# Patient Record
Sex: Female | Born: 1935 | Race: Black or African American | Hispanic: No | Smoking: Never smoker
Health system: Southern US, Community
[De-identification: ages and names within clinical notes are randomized; demographics above are authoritative.]

## PROBLEM LIST (undated history)

## (undated) DIAGNOSIS — L03116 Cellulitis of left lower limb: Secondary | ICD-10-CM

## (undated) DIAGNOSIS — E119 Type 2 diabetes mellitus without complications: Secondary | ICD-10-CM

## (undated) DIAGNOSIS — E039 Hypothyroidism, unspecified: Secondary | ICD-10-CM

## (undated) DIAGNOSIS — R2681 Unsteadiness on feet: Secondary | ICD-10-CM

## (undated) DIAGNOSIS — H9190 Unspecified hearing loss, unspecified ear: Secondary | ICD-10-CM

## (undated) DIAGNOSIS — IMO0001 Reserved for inherently not codable concepts without codable children: Secondary | ICD-10-CM

## (undated) DIAGNOSIS — I10 Essential (primary) hypertension: Secondary | ICD-10-CM

## (undated) DIAGNOSIS — G309 Alzheimer's disease, unspecified: Secondary | ICD-10-CM

## (undated) DIAGNOSIS — F0280 Dementia in other diseases classified elsewhere without behavioral disturbance: Secondary | ICD-10-CM

## (undated) DIAGNOSIS — H547 Unspecified visual loss: Secondary | ICD-10-CM

## (undated) HISTORY — PX: NO PAST SURGERIES: SHX2092

---

## 2006-11-30 ENCOUNTER — Other Ambulatory Visit: Payer: Self-pay

## 2006-11-30 ENCOUNTER — Ambulatory Visit: Payer: Self-pay | Admitting: Ophthalmology

## 2006-12-07 ENCOUNTER — Ambulatory Visit: Payer: Self-pay | Admitting: Ophthalmology

## 2011-10-01 ENCOUNTER — Emergency Department: Payer: Self-pay | Admitting: Emergency Medicine

## 2011-10-01 LAB — CBC
HCT: 39.5 % (ref 35.0–47.0)
HGB: 12.9 g/dL (ref 12.0–16.0)
MCH: 28.8 pg (ref 26.0–34.0)
MCHC: 32.7 g/dL (ref 32.0–36.0)
MCV: 88 fL (ref 80–100)
Platelet: 241 10*3/uL (ref 150–440)
WBC: 7.8 10*3/uL (ref 3.6–11.0)

## 2011-10-01 LAB — COMPREHENSIVE METABOLIC PANEL
Alkaline Phosphatase: 69 U/L (ref 50–136)
Anion Gap: 8 (ref 7–16)
BUN: 20 mg/dL — ABNORMAL HIGH (ref 7–18)
Bilirubin,Total: 0.2 mg/dL (ref 0.2–1.0)
Chloride: 102 mmol/L (ref 98–107)
Co2: 30 mmol/L (ref 21–32)
Creatinine: 0.78 mg/dL (ref 0.60–1.30)
EGFR (African American): >60
EGFR (Non-African Amer.): >60
Glucose: 75 mg/dL (ref 65–99)
Potassium: 4 mmol/L (ref 3.5–5.1)
SGPT (ALT): 26 U/L
Sodium: 140 mmol/L (ref 136–145)
Total Protein: 7.7 g/dL (ref 6.4–8.2)

## 2011-10-01 LAB — URINALYSIS, COMPLETE
Blood: NEGATIVE
Glucose,UR: 50 mg/dL (ref 0–75)
Ph: 5 (ref 4.5–8.0)
Protein: NEGATIVE
RBC,UR: 1 /HPF (ref 0–5)

## 2012-01-26 ENCOUNTER — Emergency Department: Payer: Self-pay | Admitting: Emergency Medicine

## 2014-08-12 ENCOUNTER — Ambulatory Visit
Admit: 2014-08-12 | Disposition: A | Discharge status: Home / Self Care | Payer: Self-pay | Attending: Neurology | Admitting: Neurology

## 2015-01-07 ENCOUNTER — Emergency Department: Payer: Medicare Other

## 2015-01-07 ENCOUNTER — Inpatient Hospital Stay
Admission: EM | Admit: 2015-01-07 | Discharge: 2015-01-11 | DRG: 469 | Disposition: A | Discharge status: Skilled Nursing Facility | Payer: Medicare Other | Attending: Internal Medicine | Admitting: Internal Medicine

## 2015-01-07 ENCOUNTER — Encounter: Payer: Self-pay | Admitting: *Deleted

## 2015-01-07 DIAGNOSIS — F0280 Dementia in other diseases classified elsewhere without behavioral disturbance: Secondary | ICD-10-CM | POA: Diagnosis present

## 2015-01-07 DIAGNOSIS — R509 Fever, unspecified: Secondary | ICD-10-CM

## 2015-01-07 DIAGNOSIS — Z79899 Other long term (current) drug therapy: Secondary | ICD-10-CM | POA: Diagnosis not present

## 2015-01-07 DIAGNOSIS — S72009A Fracture of unspecified part of neck of unspecified femur, initial encounter for closed fracture: Secondary | ICD-10-CM

## 2015-01-07 DIAGNOSIS — G309 Alzheimer's disease, unspecified: Secondary | ICD-10-CM | POA: Diagnosis present

## 2015-01-07 DIAGNOSIS — E039 Hypothyroidism, unspecified: Secondary | ICD-10-CM | POA: Diagnosis present

## 2015-01-07 DIAGNOSIS — Z8781 Personal history of (healed) traumatic fracture: Secondary | ICD-10-CM

## 2015-01-07 DIAGNOSIS — Z9889 Other specified postprocedural states: Secondary | ICD-10-CM

## 2015-01-07 DIAGNOSIS — J9811 Atelectasis: Secondary | ICD-10-CM | POA: Diagnosis not present

## 2015-01-07 DIAGNOSIS — H547 Unspecified visual loss: Secondary | ICD-10-CM | POA: Diagnosis present

## 2015-01-07 DIAGNOSIS — S72001A Fracture of unspecified part of neck of right femur, initial encounter for closed fracture: Secondary | ICD-10-CM | POA: Diagnosis present

## 2015-01-07 DIAGNOSIS — R2681 Unsteadiness on feet: Secondary | ICD-10-CM | POA: Diagnosis present

## 2015-01-07 DIAGNOSIS — G9340 Encephalopathy, unspecified: Secondary | ICD-10-CM | POA: Diagnosis not present

## 2015-01-07 DIAGNOSIS — W19XXXA Unspecified fall, initial encounter: Secondary | ICD-10-CM | POA: Diagnosis present

## 2015-01-07 DIAGNOSIS — E785 Hyperlipidemia, unspecified: Secondary | ICD-10-CM | POA: Diagnosis present

## 2015-01-07 DIAGNOSIS — E119 Type 2 diabetes mellitus without complications: Secondary | ICD-10-CM | POA: Diagnosis present

## 2015-01-07 DIAGNOSIS — I10 Essential (primary) hypertension: Secondary | ICD-10-CM | POA: Diagnosis present

## 2015-01-07 DIAGNOSIS — Z7982 Long term (current) use of aspirin: Secondary | ICD-10-CM | POA: Diagnosis not present

## 2015-01-07 DIAGNOSIS — F0390 Unspecified dementia without behavioral disturbance: Secondary | ICD-10-CM | POA: Diagnosis present

## 2015-01-07 DIAGNOSIS — H9190 Unspecified hearing loss, unspecified ear: Secondary | ICD-10-CM | POA: Diagnosis present

## 2015-01-07 DIAGNOSIS — R339 Retention of urine, unspecified: Secondary | ICD-10-CM | POA: Diagnosis not present

## 2015-01-07 DIAGNOSIS — D62 Acute posthemorrhagic anemia: Secondary | ICD-10-CM | POA: Diagnosis not present

## 2015-01-07 DIAGNOSIS — F0391 Unspecified dementia with behavioral disturbance: Secondary | ICD-10-CM

## 2015-01-07 DIAGNOSIS — Z7952 Long term (current) use of systemic steroids: Secondary | ICD-10-CM | POA: Diagnosis not present

## 2015-01-07 HISTORY — DX: Unspecified hearing loss, unspecified ear: H91.90

## 2015-01-07 HISTORY — DX: Unspecified visual loss: H54.7

## 2015-01-07 HISTORY — DX: Essential (primary) hypertension: I10

## 2015-01-07 HISTORY — DX: Type 2 diabetes mellitus without complications: E11.9

## 2015-01-07 HISTORY — DX: Dementia in other diseases classified elsewhere, unspecified severity, without behavioral disturbance, psychotic disturbance, mood disturbance, and anxiety: F02.80

## 2015-01-07 HISTORY — DX: Reserved for inherently not codable concepts without codable children: IMO0001

## 2015-01-07 HISTORY — DX: Unsteadiness on feet: R26.81

## 2015-01-07 HISTORY — DX: Alzheimer's disease, unspecified: G30.9

## 2015-01-07 HISTORY — DX: Hypothyroidism, unspecified: E03.9

## 2015-01-07 LAB — GLUCOSE, CAPILLARY: Glucose-Capillary: 212 mg/dL — ABNORMAL HIGH (ref 65–99)

## 2015-01-07 LAB — BASIC METABOLIC PANEL
Anion gap: 11 (ref 5–15)
BUN: 22 mg/dL — AB (ref 6–20)
CALCIUM: 9.5 mg/dL (ref 8.9–10.3)
CO2: 27 mmol/L (ref 22–32)
Chloride: 102 mmol/L (ref 101–111)
Creatinine, Ser: 0.97 mg/dL (ref 0.44–1.00)
GFR calc Af Amer: >60 mL/min (ref >60)
GFR, EST NON AFRICAN AMERICAN: 52 mL/min — AB (ref >60)
Glucose, Bld: 224 mg/dL — ABNORMAL HIGH (ref 65–99)
POTASSIUM: 4.8 mmol/L (ref 3.5–5.1)
SODIUM: 140 mmol/L (ref 135–145)

## 2015-01-07 LAB — CBC WITH DIFFERENTIAL/PLATELET
BASOS ABS: 0 10*3/uL (ref 0–0.1)
Basophils Relative: 0 %
EOS ABS: 0.1 10*3/uL (ref 0–0.7)
EOS PCT: 2 %
HCT: 39.9 % (ref 35.0–47.0)
Hemoglobin: 13.2 g/dL (ref 12.0–16.0)
LYMPHS ABS: 1.6 10*3/uL (ref 1.0–3.6)
Lymphocytes Relative: 23 %
MCH: 28.8 pg (ref 26.0–34.0)
MCHC: 33.1 g/dL (ref 32.0–36.0)
MCV: 87.1 fL (ref 80.0–100.0)
Monocytes Absolute: 0.4 10*3/uL (ref 0.2–0.9)
Monocytes Relative: 6 %
Neutro Abs: 4.6 10*3/uL (ref 1.4–6.5)
Neutrophils Relative %: 69 %
PLATELETS: 221 10*3/uL (ref 150–440)
RBC: 4.59 MIL/uL (ref 3.80–5.20)
RDW: 13.3 % (ref 11.5–14.5)
WBC: 6.7 10*3/uL (ref 3.6–11.0)

## 2015-01-07 LAB — APTT: aPTT: 28 seconds (ref 24–36)

## 2015-01-07 LAB — ALBUMIN: Albumin: 4 g/dL (ref 3.5–5.0)

## 2015-01-07 LAB — PROTIME-INR
INR: 0.99
Prothrombin Time: 13.3 seconds (ref 11.4–15.0)

## 2015-01-07 LAB — TYPE AND SCREEN
ABO/RH(D): A POS
ANTIBODY SCREEN: NEGATIVE

## 2015-01-07 MED ORDER — INSULIN DETEMIR 100 UNIT/ML ~~LOC~~ SOLN
20.0000 [IU] | Freq: Every day | SUBCUTANEOUS | Status: DC
  Administered 2015-01-07 – 2015-01-09 (×3): 20 [IU] via SUBCUTANEOUS
  Filled 2015-01-07 (×4): qty 0.2

## 2015-01-07 MED ORDER — ONDANSETRON HCL 4 MG/2ML IJ SOLN
4.0000 mg | Freq: Four times a day (QID) | INTRAMUSCULAR | Status: DC | PRN
  Administered 2015-01-08: 4 mg via INTRAVENOUS

## 2015-01-07 MED ORDER — LISINOPRIL 5 MG PO TABS
5.0000 mg | ORAL_TABLET | Freq: Every day | ORAL | Status: DC
  Administered 2015-01-09 – 2015-01-11 (×3): 5 mg via ORAL
  Filled 2015-01-07 (×3): qty 1

## 2015-01-07 MED ORDER — ACETAMINOPHEN 325 MG PO TABS: 650.0000 mg | ORAL_TABLET | Freq: Four times a day (QID) | ORAL | Status: DC | PRN

## 2015-01-07 MED ORDER — LEVOTHYROXINE SODIUM 50 MCG PO TABS
75.0000 ug | ORAL_TABLET | Freq: Every day | ORAL | Status: DC
  Administered 2015-01-10 – 2015-01-11 (×2): 75 ug via ORAL
  Filled 2015-01-07 (×2): qty 1

## 2015-01-07 MED ORDER — ACETAMINOPHEN 650 MG RE SUPP: 650.0000 mg | Freq: Four times a day (QID) | RECTAL | Status: DC | PRN

## 2015-01-07 MED ORDER — TRAZODONE HCL 50 MG PO TABS
50.0000 mg | ORAL_TABLET | Freq: Every day | ORAL | Status: DC
  Administered 2015-01-07 – 2015-01-10 (×4): 50 mg via ORAL
  Filled 2015-01-07 (×4): qty 1

## 2015-01-07 MED ORDER — SIMVASTATIN 20 MG PO TABS
20.0000 mg | ORAL_TABLET | Freq: Every evening | ORAL | Status: DC
  Administered 2015-01-07 – 2015-01-10 (×3): 20 mg via ORAL
  Filled 2015-01-07 (×3): qty 1

## 2015-01-07 MED ORDER — SODIUM CHLORIDE 0.9 % IV SOLN
INTRAVENOUS | Status: DC
  Administered 2015-01-07 – 2015-01-08 (×3): via INTRAVENOUS

## 2015-01-07 MED ORDER — MORPHINE SULFATE (PF) 2 MG/ML IV SOLN: 2.0000 mg | INTRAVENOUS | Status: DC | PRN

## 2015-01-07 MED ORDER — INSULIN ASPART 100 UNIT/ML ~~LOC~~ SOLN
0.0000 [IU] | Freq: Three times a day (TID) | SUBCUTANEOUS | Status: DC
  Administered 2015-01-07: 2 [IU] via SUBCUTANEOUS
  Administered 2015-01-08: 1 [IU] via SUBCUTANEOUS
  Administered 2015-01-08: 2 [IU] via SUBCUTANEOUS
  Filled 2015-01-07: qty 2
  Filled 2015-01-07: qty 1
  Filled 2015-01-07: qty 2

## 2015-01-07 MED ORDER — CEFAZOLIN SODIUM-DEXTROSE 2-3 GM-% IV SOLR
2.0000 g | INTRAVENOUS | Status: AC
Start: 2015-01-07 — End: 2015-01-08
  Administered 2015-01-08: 2 g via INTRAVENOUS
  Filled 2015-01-07: qty 50

## 2015-01-07 MED ORDER — ONDANSETRON HCL 4 MG PO TABS: 4.0000 mg | ORAL_TABLET | Freq: Four times a day (QID) | ORAL | Status: DC | PRN

## 2015-01-07 MED ORDER — MORPHINE SULFATE (PF) 2 MG/ML IV SOLN
2.0000 mg | INTRAVENOUS | Status: AC
  Administered 2015-01-07 (×2): 2 mg via INTRAVENOUS
  Filled 2015-01-07 (×2): qty 1

## 2015-01-07 MED ORDER — VITAMIN B-12 1000 MCG PO TABS
2000.0000 ug | ORAL_TABLET | Freq: Every day | ORAL | Status: DC
  Administered 2015-01-09 – 2015-01-11 (×3): 2000 ug via ORAL
  Filled 2015-01-07 (×3): qty 2

## 2015-01-07 MED ORDER — INSULIN ASPART 100 UNIT/ML ~~LOC~~ SOLN
0.0000 [IU] | Freq: Every day | SUBCUTANEOUS | Status: DC
  Administered 2015-01-07: 2 [IU] via SUBCUTANEOUS
  Administered 2015-01-08: 4 [IU] via SUBCUTANEOUS
  Administered 2015-01-09: 2 [IU] via SUBCUTANEOUS
  Filled 2015-01-07 (×2): qty 2
  Filled 2015-01-07: qty 4

## 2015-01-07 MED ORDER — OXYCODONE HCL 5 MG PO TABS: 5.0000 mg | ORAL_TABLET | ORAL | Status: DC | PRN

## 2015-01-07 NOTE — H&P (Addendum)
[facility] - [facility] at [facility]   PATIENT NAME: [name]    MR#:  [id number]  DATE OF BIRTH:  31-Oct-1927  DATE OF ADMISSION:  01/07/2015  PRIMARY CARE PHYSICIAN: CAMPBELL, Kristen Loader, FNP   REQUESTING/REFERRING PHYSICIAN: Dr. Alfonse Flavors  CHIEF COMPLAINT:   Chief Complaint  Patient presents with  . Fall   right hip fracture  HISTORY OF PRESENT ILLNESS:  [name]  is a 81 y.o. female with a known history of Alzheimer's dementia, diabetes type 2, hypothyroidism, hypertension, who presents to the hospital after a mechanical fall at the assisted living and noted to have a right hip fracture. Patient denies any prodromal symptoms prior to her fall-like chest pain, dizziness, palpitations, nausea, vomiting or any other associated symptoms. Patient normally uses a walker to get around and was not using her walker this morning when she fell. She presented to the ER as she was having significant right hip pain and was noted to have a right hip fracture. Hospitalist services were contacted for further treatment and evaluation.  PAST MEDICAL HISTORY:   Past Medical History  Diagnosis Date  . Alzheimer's dementia   . Diabetes type 2, controlled   . Hypothyroidism   . Vision impairment   . Hearing impairment   . Hypertension   . Unsteady gait     PAST SURGICAL HISTORY:  History reviewed. No pertinent past surgical history.  SOCIAL HISTORY:   Social History  Substance Use Topics  . Smoking status: Never Smoker   . Smokeless tobacco: Not on file  . Alcohol Use: No    FAMILY HISTORY:   Family History  Problem Relation Age of Onset  . Family history unknown: Yes    DRUG ALLERGIES:  No Known Allergies  REVIEW OF SYSTEMS:   Review of Systems  Constitutional: Negative for fever and weight loss.  HENT: Negative for congestion, nosebleeds and tinnitus.   Eyes: Negative for blurred vision, double vision and redness.   Respiratory: Negative for cough, hemoptysis and shortness of breath.   Cardiovascular: Negative for chest pain, orthopnea, leg swelling and PND.  Gastrointestinal: Negative for nausea, vomiting, abdominal pain, diarrhea and melena.  Genitourinary: Negative for dysuria, urgency and hematuria.  Musculoskeletal: Positive for joint pain (right hip) and falls.  Neurological: Negative for dizziness, tingling, sensory change, focal weakness, seizures, weakness and headaches.  Endo/Heme/Allergies: Negative for polydipsia. Does not bruise/bleed easily.  Psychiatric/Behavioral: Negative for depression and memory loss. The patient is not nervous/anxious.     MEDICATIONS AT HOME:   Prior to Admission medications   Medication Sig Start Date End Date Taking? Authorizing Provider  aspirin EC 81 MG tablet Take 81 mg by mouth daily.   Yes Historical Provider, MD  insulin detemir (LEVEMIR) 100 UNIT/ML injection Inject 20 Units into the skin at bedtime.   Yes Historical Provider, MD  levothyroxine (SYNTHROID, LEVOTHROID) 75 MCG tablet Take 75 mcg by mouth daily.   Yes Historical Provider, MD  lisinopril (PRINIVIL,ZESTRIL) 5 MG tablet Take 5 mg by mouth daily.   Yes Historical Provider, MD  metFORMIN (GLUCOPHAGE-XR) 500 MG 24 hr tablet Take 1,000 mg by mouth daily.   Yes Historical Provider, MD  simvastatin (ZOCOR) 20 MG tablet Take 20 mg by mouth every evening.   Yes Historical Provider, MD  traZODone (DESYREL) 50 MG tablet Take 50 mg by mouth at bedtime.   Yes Historical Provider, MD  vitamin B-12 (CYANOCOBALAMIN) 1000 MCG tablet Take 2,000 mcg by mouth daily.  Yes Historical Provider, MD      VITAL SIGNS:  Blood pressure 126/57, pulse 81, temperature 98.4 F (36.9 C), temperature source Oral, height 5\' 3"  (1.6 m), weight 65.318 kg (144 lb), SpO2 95 %.  PHYSICAL EXAMINATION:  Physical Exam  GENERAL:  81 y.o.-year-old patient lying in the bed with no acute distress.  EYES: Pupils equal, round,  reactive to light and accommodation. No scleral icterus. Extraocular muscles intact.  HEENT: Head atraumatic, normocephalic. Oropharynx and nasopharynx clear. No oropharyngeal erythema, moist oral mucosa  NECK:  Supple, no jugular venous distention. No thyroid enlargement, no tenderness.  LUNGS: Normal breath sounds bilaterally, no wheezing, rales, rhonchi. No use of accessory muscles of respiration.  CARDIOVASCULAR: S1, S2 RRR. No murmurs, rubs, gallops, clicks.  ABDOMEN: Soft, nontender, nondistended. Bowel sounds present. No organomegaly or mass.  EXTREMITIES: No pedal edema, cyanosis, or clubbing. + 2 pedal & radial pulses b/l. Right lower extremity is shortened and externally rotated due to hip fracture  NEUROLOGIC: Cranial nerves II through XII are intact. No focal Motor or sensory deficits appreciated b/l.  Globally weak PSYCHIATRIC: The patient is alert and oriented x 3. Good affect.  SKIN: No obvious rash, lesion, or ulcer.   LABORATORY PANEL:   CBC  Recent Labs Lab 01/07/15 1404  WBC 6.7  HGB 13.2  HCT 39.9  PLT 221   ------------------------------------------------------------------------------------------------------------------  Chemistries   Recent Labs Lab 01/07/15 1404  NA 140  K 4.8  CL 102  CO2 27  GLUCOSE 224*  BUN 22*  CREATININE 0.97  CALCIUM 9.5   ------------------------------------------------------------------------------------------------------------------  Cardiac Enzymes No results for input(s): TROPONINI in the last 168 hours. ------------------------------------------------------------------------------------------------------------------  RADIOLOGY:  Dg Chest 1 View  01/07/2015   CLINICAL DATA:  Patient status post fall. Right hip pain and possible fracture.  EXAM: CHEST  1 VIEW  COMPARISON:  None.  FINDINGS: Normal cardiac and mediastinal contours. No consolidative pulmonary opacities. No pleural effusion or pneumothorax.  IMPRESSION: No  acute cardiopulmonary process.   Electronically Signed   By: Annia Belt M.D.   On: 01/07/2015 14:45   Dg Hip Unilat With Pelvis 2-3 Views Right  01/07/2015   CLINICAL DATA:  Pain following fall  EXAM: DG HIP (WITH OR WITHOUT PELVIS) 2-3V RIGHT  COMPARISON:  None.  FINDINGS: Frontal pelvis as well as frontal and lateral right hip images obtained. There is a subcapital femoral neck fracture on the right in near anatomic alignment. There is mild impaction in this area. No dislocation. No other fractures are apparent. There is moderate narrowing of both hip joints. Bones are osteoporotic.  There are calcified uterine leiomyomas in the right pelvis. Rectum is borderline distended with stool.  IMPRESSION: Subcapital femoral neck fracture on the right with mild impaction at the fracture site. Moderate narrowing both hip joints. Bones osteoporotic. No dislocation.   Electronically Signed   By: Bretta Bang III M.D.   On: 01/07/2015 14:43     IMPRESSION AND PLAN:   81 year old female with past medical history of Alzheimer's dementia, diabetes, hypertension, hypothyroidism who presents to the hospital from assisted living after you're fall and noted to have a right hip fracture.  #1 preoperative medical evaluation-patient is likely a low to moderate risk for noncardiac surgery. -There are no absolute contraindications to surgery at this time. EKG has been reviewed which shows no acute ST or T-wave changes.  #2 right hip fracture-we'll consult orthopedics. Continue supportive care with pain control for now. -Patient likely go to  for surgery tomorrow.  #3 diabetes type 2 without complication-continue Levemir, sliding scale insulin. Hold metformin.  #4 hypothyroidism-continue Synthroid.  #5 hypertension-continue lisinopril.  #6 hyperlipidemia-continue simvastatin.    All the records are reviewed and case discussed with ED provider. Management plans discussed with the patient, family and they are  in agreement.  CODE STATUS: Full  TOTAL TIME TAKING CARE OF THIS PATIENT: 45 minutes.    Houston Siren M.D on 01/07/2015 at 4:03 PM  Between 7am to 6pm - Pager - [phone]  After 6pm go to [email or web address] - password [email or web address] [facility]  [facility] [place]  Office  [phone]  CC: Primary care physician; CAMPBELL, Kristen Loader, FNP

## 2015-01-07 NOTE — ED Notes (Signed)
Pt arrived via EMS from [facility] after a witnessed fall. Pt fell onto back and right hip. Pt and caregivers deny pt hitting head. Pt reporting pain in right pelvis and hip area. Pain increases with movement of leg and foot. No verbalized increase of pain with palpation. Pt is alert and oriented to person place and time when asked but has hx of dementia and is confused on commands given to pt. EMS reports this is pts baseline.

## 2015-01-07 NOTE — Progress Notes (Signed)
Clinical Child psychotherapist (CSW) confirmed that patient is a [facility] ALF resident from [facility]. Per Liborio Nixon patient has a daughter in [place] and patient was placed at [facility] by APS. CSW contacted Nadine Counts APS supervisor. Per Nadine Counts APS is not patient's guardian. CSW will continue to follow and assist as needed.   Jetta Lout, LCSWA [phone]

## 2015-01-07 NOTE — ED Notes (Signed)
Floor called by this RN attempting to give report. Floor reports they will call back shortly.

## 2015-01-07 NOTE — ED Provider Notes (Signed)
[facility] Emergency Department Provider Note  ____________________________________________  Time seen: 1:15 PM on arrival by EMS  I have reviewed the triage vital signs and the nursing notes.   HISTORY  Chief Complaint Fall    HPI [name] is a 81 y.o. female that a witnessed fall onto her right hip today at a nursing home. Bystanders who witnessed the fall denied any head injury. Pink she complains of pain in the right hip. Worse with movement of the right hip.  Patient denies any other symptoms. No chest pain shortness of breath abdominal pain back pain dizziness fevers or chills. Reports normal oral intake recently.     Past Medical History  Diagnosis Date  . Alzheimer's dementia   . Diabetes type 2, controlled   . Hypothyroidism   . Vision impairment   . Hearing impairment   . Hypertension   . Unsteady gait    Hypertension diabetes dementia  There are no active problems to display for this patient.    History reviewed. No pertinent past surgical history. None known  Current Outpatient Rx  Name  Route  Sig  Dispense  Refill  . aspirin EC 81 MG tablet   Oral   Take 81 mg by mouth daily.         . insulin detemir (LEVEMIR) 100 UNIT/ML injection   Subcutaneous   Inject 20 Units into the skin at bedtime.         Marland Kitchen levothyroxine (SYNTHROID, LEVOTHROID) 75 MCG tablet   Oral   Take 75 mcg by mouth daily.         Marland Kitchen lisinopril (PRINIVIL,ZESTRIL) 5 MG tablet   Oral   Take 5 mg by mouth daily.         . metFORMIN (GLUCOPHAGE-XR) 500 MG 24 hr tablet   Oral   Take 1,000 mg by mouth daily.         . simvastatin (ZOCOR) 20 MG tablet   Oral   Take 20 mg by mouth every evening.         . traZODone (DESYREL) 50 MG tablet   Oral   Take 50 mg by mouth at bedtime.         . vitamin B-12 (CYANOCOBALAMIN) 1000 MCG tablet   Oral   Take 2,000 mcg by mouth daily.          See printed medication  list.  Allergies Review of patient's allergies indicates no known allergies.   History reviewed. No pertinent family history.  Social History Social History  Substance Use Topics  . Smoking status: Never Smoker   . Smokeless tobacco: None  . Alcohol Use: No  no tobacco alcohol or drug use.  Review of Systems  Constitutional:   No fever or chills. No weight changes Eyes:   No blurry vision or double vision.  ENT:   No sore throat. Cardiovascular:   No chest pain. Respiratory:   No dyspnea or cough. Gastrointestinal:   Negative for abdominal pain, vomiting and diarrhea.  No BRBPR or melena. Genitourinary:   Negative for dysuria, urinary retention, bloody urine, or difficulty urinating. Musculoskeletal:   right hip pain today. Skin:   Negative for rash. Neurological:   Negative for headaches, focal weakness or numbness. Psychiatric:  No anxiety or depression.   Endocrine:  No hot/cold intolerance, changes in energy, or sleep difficulty.  10-point ROS otherwise negative.  ____________________________________________   PHYSICAL EXAM:  VITAL SIGNS: ED Triage Vitals  Enc Vitals  Group     BP 01/07/15 1344 157/73 mmHg     Pulse Rate 01/07/15 1344 80     Resp --      Temp 01/07/15 1344 98.4 F (36.9 C)     Temp Source 01/07/15 1344 Oral     SpO2 01/07/15 1320 97 %     Weight 01/07/15 1344 144 lb (65.318 kg)     Height 01/07/15 1344 5\' 3"  (1.6 m)     Head Cir --      Peak Flow --      Pain Score --      Pain Loc --      Pain Edu? --      Excl. in GC? --      Constitutional:   Alert and oriented2. Well appearing and in no distress. Eyes:   No scleral icterus. No conjunctival pallor. PERRL. EOMI ENT   Head:   Normocephalic and atraumatic.   Nose:   No congestion/rhinnorhea. No septal hematoma   Mouth/Throat:   MMM, no pharyngeal erythema. No peritonsillar mass. No uvula shift.   Neck:   No stridor. No SubQ emphysema. No  meningismus. Hematological/Lymphatic/Immunilogical:   No cervical lymphadenopathy. Cardiovascular:   RRR. Normal and symmetric distal pulses are present in all extremities. No murmurs, rubs, or gallops. Respiratory:   Normal respiratory effort without tachypnea nor retractions. Breath sounds are clear and equal bilaterally. No wheezes/rales/rhonchi. Gastrointestinal:   Soft and nontender. No distention. There is no CVA tenderness.  No rebound, rigidity, or guarding. Genitourinary:   deferred Musculoskeletal:   No tenderness to palpation of the pelvis or right lower leg. Pelvis is stable. On passive range of motion of the right hip the patient exhibits severe pain.Marland Kitchen Neurologic:   Perseverates stating "I feel like I had a fall today".  CN 2-10 normal. Motor grossly intact. No pronator drift.  Normal gait. No gross focal neurologic deficits are appreciated.  Skin:    Skin is warm, dry and intact. No rash noted.  No petechiae, purpura, or bullae. Psychiatric:   Mood and affect are normal. Speech and behavior are normal. Patient exhibits appropriate insight and judgment.  ____________________________________________    LABS (pertinent positives/negatives) (all labs ordered are listed, but only abnormal results are displayed) Labs Reviewed  BASIC METABOLIC PANEL - Abnormal; Notable for the following:    Glucose, Bld 224 (*)    BUN 22 (*)    GFR calc non Af Amer 52 (*)    All other components within normal limits  CBC WITH DIFFERENTIAL/PLATELET   ____________________________________________   EKG  Interpreted by me Normal sinus rhythm rate of 81, normal axis intervals QRS and ST segments and T waves.  ____________________________________________    RADIOLOGY  Chest x-ray unremarkable X-ray right hip shows a subcapital femoral neck fracture on the right  ____________________________________________   PROCEDURES   ____________________________________________   INITIAL  IMPRESSION / ASSESSMENT AND PLAN / ED COURSE  Pertinent labs & imaging results that were available during my care of the patient were reviewed by me and considered in my medical decision making (see chart for details).  Patient presents with right hip pain after a fall. High suspicion for hip fracture. We'll get x-rays and labs.  ----------------------------------------- 3:16 PM on 01/07/2015 -----------------------------------------  X-ray positive for hip fracture. Discussed with hospitalist, evaluated by Dr. Cherlynn Kaiser in the ED. Discussed with [place] or Dr. Martha Clan who will consult on the patient.   ____________________________________________   FINAL CLINICAL IMPRESSION(S) / ED  DIAGNOSES  Final diagnoses:  Femoral neck fracture, right, closed, initial encounter      Sharman Cheek, MD 01/07/15 1521

## 2015-01-07 NOTE — ED Notes (Signed)
Pt blood glucose is 177 per finger stick.

## 2015-01-07 NOTE — ED Notes (Signed)
Spoke with Crystal at [facility] and reported pt would be admitted for hip fracture.

## 2015-01-07 NOTE — Consult Note (Signed)
ORTHOPAEDIC CONSULTATION  REQUESTING PHYSICIAN: Houston Siren, MD  Chief Complaint: Right hip pain  HPI: [name] is a 81 y.o. female who complains of  right hip pain status post fall today. Patient is a group home resident. She sustained a mechanical fall. She states that she was trying to hang something up and lost her balance. Patient had pain in the right hip. She was sent [facility]. X-rays were taken and she was diagnosed with a right subcapital femoral neck hip fracture.  Past Medical History  Diagnosis Date  . Alzheimer's dementia   . Diabetes type 2, controlled   . Hypothyroidism   . Vision impairment   . Hearing impairment   . Hypertension   . Unsteady gait    History reviewed. No pertinent past surgical history. Social History   Social History  . Marital Status: Widowed    Spouse Name: N/A  . Number of Children: N/A  . Years of Education: N/A   Social History Main Topics  . Smoking status: Never Smoker   . Smokeless tobacco: None  . Alcohol Use: No  . Drug Use: No  . Sexual Activity: Not Asked   Other Topics Concern  . None   Social History Narrative  . None   Family History  Problem Relation Age of Onset  . Family history unknown: Yes   No Known Allergies Prior to Admission medications   Medication Sig Start Date End Date Taking? Authorizing Provider  aspirin EC 81 MG tablet Take 81 mg by mouth daily.   Yes Historical Provider, MD  insulin detemir (LEVEMIR) 100 UNIT/ML injection Inject 20 Units into the skin at bedtime.   Yes Historical Provider, MD  levothyroxine (SYNTHROID, LEVOTHROID) 75 MCG tablet Take 75 mcg by mouth daily.   Yes Historical Provider, MD  lisinopril (PRINIVIL,ZESTRIL) 5 MG tablet Take 5 mg by mouth daily.   Yes Historical Provider, MD  metFORMIN (GLUCOPHAGE-XR) 500 MG 24 hr tablet Take 1,000 mg by mouth daily.   Yes Historical Provider, MD  simvastatin (ZOCOR) 20 MG tablet Take 20 mg by mouth  every evening.   Yes Historical Provider, MD  traZODone (DESYREL) 50 MG tablet Take 50 mg by mouth at bedtime.   Yes Historical Provider, MD  vitamin B-12 (CYANOCOBALAMIN) 1000 MCG tablet Take 2,000 mcg by mouth daily.   Yes Historical Provider, MD   Dg Chest 1 View  01/07/2015   CLINICAL DATA:  Patient status post fall. Right hip pain and possible fracture.  EXAM: CHEST  1 VIEW  COMPARISON:  None.  FINDINGS: Normal cardiac and mediastinal contours. No consolidative pulmonary opacities. No pleural effusion or pneumothorax.  IMPRESSION: No acute cardiopulmonary process.   Electronically Signed   By: Annia Belt M.D.   On: 01/07/2015 14:45   Dg Hip Unilat With Pelvis 2-3 Views Right  01/07/2015   CLINICAL DATA:  Pain following fall  EXAM: DG HIP (WITH OR WITHOUT PELVIS) 2-3V RIGHT  COMPARISON:  None.  FINDINGS: Frontal pelvis as well as frontal and lateral right hip images obtained. There is a subcapital femoral neck fracture on the right in near anatomic alignment. There is mild impaction in this area. No dislocation. No other fractures are apparent. There is moderate narrowing of both hip joints. Bones are osteoporotic.  There are calcified uterine leiomyomas in the right pelvis. Rectum is borderline distended with stool.  IMPRESSION: Subcapital femoral neck fracture on the right with mild impaction at the fracture site. Moderate  narrowing both hip joints. Bones osteoporotic. No dislocation.   Electronically Signed   By: Bretta Bang III M.D.   On: 01/07/2015 14:43    Positive ROS: All other systems have been reviewed and were otherwise negative with the exception of those mentioned in the HPI and as above.  Physical Exam: General: Alert, no acute distress  MUSCULOSKELETAL: Right hip: Patient's skin is intact. She has soft thigh and leg compartments.  She has palpable pedal pulses. She has intact sensation to light touch. She can gently flex and extend her toes. Her right lower extremity is  shortened and externally rotated.  Assessment: Right subcapital femoral neck hip fracture  Plan: I explained to the patient about her injury. I'm recommending hemiarthroplasty as treatment for her subcapital femoral neck hip fracture. I spoke with one needed at her group home who provided me with the phone number to her daughter [name]. (Phone number [phone]) I spoke with [name] and explained to her about her mother's hip fracture. I explained that the plan was to proceed with a hemiarthroplasty. She understands that this is a hip replacement type of surgery. I discussed the risks and benefits of surgery. The risks include but are not limited to infection, bleeding requiring blood transfusion, nerve or blood vessel injury, joint stiffness or loss of motion, persistent pain, weakness or instability, malunion, nonunion and hardware failure and the need for further surgery. Medical risks include but are not limited to DVT and pulmonary embolism, myocardial infarction, stroke, pneumonia, respiratory failure and death.  She is in agreement with the plan to proceed with surgery.  The plan will be to proceed with surgery tomorrow pending medical clearance.     [name], MD    01/07/2015 3:49 PM

## 2015-01-07 NOTE — ED Notes (Addendum)
Pts daughter [name] was called in order to obtain consent. Daughter was not reached at [phone] but this is the correct phone number. Will attempt to call again later.

## 2015-01-08 ENCOUNTER — Inpatient Hospital Stay: Payer: Medicare Other

## 2015-01-08 ENCOUNTER — Encounter
Admission: EM | Disposition: A | Discharge status: Skilled Nursing Facility | Payer: Self-pay | Source: Home / Self Care | Attending: Internal Medicine

## 2015-01-08 ENCOUNTER — Encounter: Payer: Self-pay | Admitting: Anesthesiology

## 2015-01-08 ENCOUNTER — Inpatient Hospital Stay: Payer: Medicare Other | Admitting: Certified Registered Nurse Anesthetist

## 2015-01-08 HISTORY — PX: HIP ARTHROPLASTY: SHX981

## 2015-01-08 LAB — BASIC METABOLIC PANEL
Anion gap: 7 (ref 5–15)
BUN: 17 mg/dL (ref 6–20)
CO2: 24 mmol/L (ref 22–32)
CREATININE: 0.9 mg/dL (ref 0.44–1.00)
Calcium: 8.5 mg/dL — ABNORMAL LOW (ref 8.9–10.3)
Chloride: 108 mmol/L (ref 101–111)
GFR calc Af Amer: >60 mL/min (ref >60)
GFR, EST NON AFRICAN AMERICAN: 57 mL/min — AB (ref >60)
GLUCOSE: 197 mg/dL — AB (ref 65–99)
POTASSIUM: 4.1 mmol/L (ref 3.5–5.1)
SODIUM: 139 mmol/L (ref 135–145)

## 2015-01-08 LAB — URINALYSIS COMPLETE WITH MICROSCOPIC (ARMC ONLY)
BACTERIA UA: NONE SEEN
BILIRUBIN URINE: NEGATIVE
Glucose, UA: >500 mg/dL — AB
HGB URINE DIPSTICK: NEGATIVE
KETONES UR: NEGATIVE mg/dL
LEUKOCYTES UA: NEGATIVE
NITRITE: NEGATIVE
PH: 5 (ref 5.0–8.0)
PROTEIN: NEGATIVE mg/dL
Specific Gravity, Urine: 1.017 (ref 1.005–1.030)
Squamous Epithelial / LPF: NONE SEEN

## 2015-01-08 LAB — MRSA PCR SCREENING: MRSA by PCR: NEGATIVE

## 2015-01-08 LAB — ABO/RH: ABO/RH(D): A POS

## 2015-01-08 LAB — CBC
HCT: 35.3 % (ref 35.0–47.0)
Hemoglobin: 12 g/dL (ref 12.0–16.0)
MCH: 29 pg (ref 26.0–34.0)
MCHC: 34.1 g/dL (ref 32.0–36.0)
MCV: 85.1 fL (ref 80.0–100.0)
PLATELETS: 214 10*3/uL (ref 150–440)
RBC: 4.15 MIL/uL (ref 3.80–5.20)
RDW: 13.6 % (ref 11.5–14.5)
WBC: 11.5 10*3/uL — ABNORMAL HIGH (ref 3.6–11.0)

## 2015-01-08 LAB — GLUCOSE, CAPILLARY
GLUCOSE-CAPILLARY: 146 mg/dL — AB (ref 65–99)
GLUCOSE-CAPILLARY: 313 mg/dL — AB (ref 65–99)
Glucose-Capillary: 180 mg/dL — ABNORMAL HIGH (ref 65–99)

## 2015-01-08 LAB — VITAMIN D 25 HYDROXY (VIT D DEFICIENCY, FRACTURES): VIT D 25 HYDROXY: 30.6 ng/mL (ref 30.0–100.0)

## 2015-01-08 SURGERY — HEMIARTHROPLASTY, HIP, DIRECT ANTERIOR APPROACH, FOR FRACTURE
Anesthesia: General | Site: Hip | Laterality: Right | Wound class: Clean

## 2015-01-08 MED ORDER — ACETAMINOPHEN 650 MG RE SUPP: 650.0000 mg | Freq: Four times a day (QID) | RECTAL | Status: DC | PRN

## 2015-01-08 MED ORDER — ONDANSETRON HCL 4 MG/2ML IJ SOLN: 4.0000 mg | Freq: Four times a day (QID) | INTRAMUSCULAR | Status: DC | PRN

## 2015-01-08 MED ORDER — MENTHOL 3 MG MT LOZG
1.0000 | LOZENGE | OROMUCOSAL | Status: DC | PRN
  Filled 2015-01-08: qty 9

## 2015-01-08 MED ORDER — ACETAMINOPHEN 10 MG/ML IV SOLN
INTRAVENOUS | Status: DC | PRN
  Administered 2015-01-08: 1000 mg via INTRAVENOUS

## 2015-01-08 MED ORDER — FENTANYL CITRATE (PF) 100 MCG/2ML IJ SOLN
INTRAMUSCULAR | Status: DC | PRN
  Administered 2015-01-08: 50 ug via INTRAVENOUS
  Administered 2015-01-08: 25 ug via INTRAVENOUS
  Administered 2015-01-08: 50 ug via INTRAVENOUS
  Administered 2015-01-08 (×4): 25 ug via INTRAVENOUS
  Administered 2015-01-08: 50 ug via INTRAVENOUS

## 2015-01-08 MED ORDER — OXYCODONE HCL 5 MG PO TABS
5.0000 mg | ORAL_TABLET | ORAL | Status: DC | PRN
  Administered 2015-01-08 – 2015-01-10 (×7): 5 mg via ORAL
  Filled 2015-01-08 (×7): qty 1

## 2015-01-08 MED ORDER — MORPHINE SULFATE (PF) 2 MG/ML IV SOLN: 2.0000 mg | INTRAVENOUS | Status: DC | PRN

## 2015-01-08 MED ORDER — ONDANSETRON HCL 4 MG PO TABS: 4.0000 mg | ORAL_TABLET | Freq: Four times a day (QID) | ORAL | Status: DC | PRN

## 2015-01-08 MED ORDER — LACTATED RINGERS IV SOLN
INTRAVENOUS | Status: DC | PRN
  Administered 2015-01-08 (×2): via INTRAVENOUS

## 2015-01-08 MED ORDER — NEOMYCIN-POLYMYXIN B GU 40-200000 IR SOLN
Status: AC
  Filled 2015-01-08: qty 20

## 2015-01-08 MED ORDER — PROPOFOL 10 MG/ML IV BOLUS
INTRAVENOUS | Status: DC | PRN
  Administered 2015-01-08: 100 mg via INTRAVENOUS

## 2015-01-08 MED ORDER — ROCURONIUM BROMIDE 100 MG/10ML IV SOLN
INTRAVENOUS | Status: DC | PRN
  Administered 2015-01-08 (×2): 10 mg via INTRAVENOUS
  Administered 2015-01-08: 40 mg via INTRAVENOUS

## 2015-01-08 MED ORDER — CIPROFLOXACIN HCL 500 MG PO TABS
500.0000 mg | ORAL_TABLET | Freq: Two times a day (BID) | ORAL | Status: DC
  Administered 2015-01-08 – 2015-01-10 (×4): 500 mg via ORAL
  Filled 2015-01-08 (×4): qty 1

## 2015-01-08 MED ORDER — LIDOCAINE HCL (CARDIAC) 20 MG/ML IV SOLN
INTRAVENOUS | Status: DC | PRN
Start: 2015-01-08 — End: 2015-01-08
  Administered 2015-01-08: 60 mg via INTRAVENOUS

## 2015-01-08 MED ORDER — NEOMYCIN-POLYMYXIN B GU 40-200000 IR SOLN
Status: DC | PRN
  Administered 2015-01-08: 16 mL

## 2015-01-08 MED ORDER — NEOSTIGMINE METHYLSULFATE 10 MG/10ML IV SOLN
INTRAVENOUS | Status: DC | PRN
  Administered 2015-01-08: 3 mg via INTRAVENOUS

## 2015-01-08 MED ORDER — PHENOL 1.4 % MT LIQD: 1.0000 | OROMUCOSAL | Status: DC | PRN

## 2015-01-08 MED ORDER — ENOXAPARIN SODIUM 30 MG/0.3ML ~~LOC~~ SOLN
30.0000 mg | Freq: Two times a day (BID) | SUBCUTANEOUS | Status: DC
  Administered 2015-01-09 – 2015-01-11 (×5): 30 mg via SUBCUTANEOUS
  Filled 2015-01-08 (×5): qty 0.3

## 2015-01-08 MED ORDER — BISACODYL 10 MG RE SUPP
10.0000 mg | Freq: Every day | RECTAL | Status: DC | PRN
  Administered 2015-01-10: 10 mg via RECTAL
  Filled 2015-01-08: qty 1

## 2015-01-08 MED ORDER — ONDANSETRON HCL 4 MG/2ML IJ SOLN: 4.0000 mg | Freq: Once | INTRAMUSCULAR | Status: DC | PRN

## 2015-01-08 MED ORDER — ACETAMINOPHEN 325 MG PO TABS
650.0000 mg | ORAL_TABLET | Freq: Four times a day (QID) | ORAL | Status: DC | PRN
  Administered 2015-01-09 – 2015-01-11 (×4): 650 mg via ORAL
  Filled 2015-01-08 (×4): qty 2

## 2015-01-08 MED ORDER — GLYCOPYRROLATE 0.2 MG/ML IJ SOLN
INTRAMUSCULAR | Status: DC | PRN
  Administered 2015-01-08: 0.6 mg via INTRAVENOUS

## 2015-01-08 MED ORDER — FENTANYL CITRATE (PF) 100 MCG/2ML IJ SOLN: 25.0000 ug | INTRAMUSCULAR | Status: DC | PRN

## 2015-01-08 MED ORDER — ALUM & MAG HYDROXIDE-SIMETH 200-200-20 MG/5ML PO SUSP: 30.0000 mL | ORAL | Status: DC | PRN

## 2015-01-08 MED ORDER — PNEUMOCOCCAL VAC POLYVALENT 25 MCG/0.5ML IJ INJ: 0.5000 mL | INJECTION | INTRAMUSCULAR | Status: DC

## 2015-01-08 MED ORDER — POLYETHYLENE GLYCOL 3350 17 G PO PACK
17.0000 g | PACK | Freq: Every day | ORAL | Status: DC | PRN
  Administered 2015-01-10: 17 g via ORAL
  Filled 2015-01-08: qty 1

## 2015-01-08 MED ORDER — FERROUS SULFATE 325 (65 FE) MG PO TABS
325.0000 mg | ORAL_TABLET | Freq: Three times a day (TID) | ORAL | Status: DC
Start: 2015-01-08 — End: 2015-01-11
  Administered 2015-01-09 – 2015-01-11 (×6): 325 mg via ORAL
  Filled 2015-01-08 (×6): qty 1

## 2015-01-08 MED ORDER — MAGNESIUM CITRATE PO SOLN
1.0000 | Freq: Once | ORAL | Status: DC | PRN
  Filled 2015-01-08: qty 296

## 2015-01-08 MED ORDER — CEFAZOLIN SODIUM-DEXTROSE 2-3 GM-% IV SOLR
2.0000 g | Freq: Four times a day (QID) | INTRAVENOUS | Status: AC
  Administered 2015-01-08 – 2015-01-09 (×2): 2 g via INTRAVENOUS
  Filled 2015-01-08 (×2): qty 50

## 2015-01-08 MED ORDER — DOCUSATE SODIUM 100 MG PO CAPS
100.0000 mg | ORAL_CAPSULE | Freq: Two times a day (BID) | ORAL | Status: DC
Start: 2015-01-08 — End: 2015-01-11
  Administered 2015-01-08 – 2015-01-11 (×6): 100 mg via ORAL
  Filled 2015-01-08 (×6): qty 1

## 2015-01-08 MED ORDER — SODIUM CHLORIDE 0.9 % IV SOLN
75.0000 mL/h | INTRAVENOUS | Status: DC
  Administered 2015-01-08 (×2): 75 mL/h via INTRAVENOUS

## 2015-01-08 SURGICAL SUPPLY — 45 items
BLADE SAGITTAL WIDE XTHICK NO (BLADE) ×3 IMPLANT
BLADE SURG SZ10 CARB STEEL (BLADE) ×3 IMPLANT
CANISTER SUCT 1200ML W/VALVE (MISCELLANEOUS) ×3 IMPLANT
CANISTER SUCT 3000ML PPV (MISCELLANEOUS) ×6 IMPLANT
CAPT HIP HEMI 2 ×3 IMPLANT
DRAPE IMP U-DRAPE 54X76 (DRAPES) ×3 IMPLANT
DRAPE INCISE IOBAN 66X60 STRL (DRAPES) ×3 IMPLANT
DRAPE SHEET LG 3/4 BI-LAMINATE (DRAPES) ×6 IMPLANT
DRAPE SURG 17X11 SM STRL (DRAPES) ×3 IMPLANT
DRAPE TABLE BACK 80X90 (DRAPES) ×3 IMPLANT
DRSG OPSITE POSTOP 4X10 (GAUZE/BANDAGES/DRESSINGS) ×3 IMPLANT
ELECT BLADE 6.5 EXT (BLADE) ×3 IMPLANT
ELECT CAUTERY BLADE 6.4 (BLADE) ×3 IMPLANT
GAUZE PETRO XEROFOAM 1X8 (MISCELLANEOUS) ×6 IMPLANT
GAUZE SPONGE 4X4 12PLY STRL (GAUZE/BANDAGES/DRESSINGS) ×3 IMPLANT
GLOVE BIOGEL PI IND STRL 9 (GLOVE) ×1 IMPLANT
GLOVE BIOGEL PI INDICATOR 9 (GLOVE) ×2
GLOVE SURG 9.0 ORTHO LTXF (GLOVE) ×6 IMPLANT
GOWN STRL REUS TWL 2XL XL LVL4 (GOWN DISPOSABLE) ×3 IMPLANT
GOWN STRL REUS W/ TWL LRG LVL3 (GOWN DISPOSABLE) ×1 IMPLANT
GOWN STRL REUS W/TWL LRG LVL3 (GOWN DISPOSABLE) ×2
HANDPIECE SUCTION TUBG SURGILV (MISCELLANEOUS) ×3 IMPLANT
HEMOVAC 400ML (MISCELLANEOUS) ×3
HIP CAPITATED HEMI 2 ×1 IMPLANT
KIT DRAIN HEMOVAC JP 7FR 400ML (MISCELLANEOUS) ×1 IMPLANT
KIT RM TURNOVER STRD PROC AR (KITS) ×3 IMPLANT
NDL SAFETY 18GX1.5 (NEEDLE) ×3 IMPLANT
NEEDLE FILTER BLUNT 18X 1/2SAF (NEEDLE) ×2
NEEDLE FILTER BLUNT 18X1 1/2 (NEEDLE) ×1 IMPLANT
NEEDLE MAYO CATGUT SZ4 (NEEDLE) ×3 IMPLANT
NS IRRIG 1000ML POUR BTL (IV SOLUTION) ×3 IMPLANT
PACK HIP PROSTHESIS (MISCELLANEOUS) ×3 IMPLANT
PAD GROUND ADULT SPLIT (MISCELLANEOUS) ×9 IMPLANT
PILLOW ABDUC SM (MISCELLANEOUS) ×3 IMPLANT
RETRIEVER SUT HEWSON (MISCELLANEOUS) ×3 IMPLANT
SOL .9 NS 3000ML IRR  AL (IV SOLUTION) ×2
SOL .9 NS 3000ML IRR UROMATIC (IV SOLUTION) ×1 IMPLANT
STAPLER SKIN PROX 35W (STAPLE) ×3 IMPLANT
SUT MNCRL 3 0 RB1 (SUTURE) ×1 IMPLANT
SUT MONOCRYL 3 0 RB1 (SUTURE) ×2
SUT TICRON 2-0 30IN 311381 (SUTURE) ×12 IMPLANT
SUT VIC AB 0 CT1 36 (SUTURE) ×3 IMPLANT
SUT VIC AB 2-0 CT2 27 (SUTURE) ×6 IMPLANT
SYRINGE 10CC LL (SYRINGE) ×3 IMPLANT
TAPE MICROFOAM 4IN (TAPE) ×3 IMPLANT

## 2015-01-08 NOTE — Care Management Note (Signed)
Case Management Note  Patient Details  Name: [name] MRN: [id number] Date of Birth: [birth year]  Subjective/Objective:                 Patient presents from [facility] status post fall resulting in hip fracture.  Plan for OR today.  Patient states that she has a RW and shower chair at [employer].     Action/Plan: RNCM to follow up with discharge planning  Expected Discharge Date:                  Expected Discharge Plan:     In-House Referral:     Discharge planning Services  CM Consult  Post Acute Care Choice:    Choice offered to:     DME Arranged:    DME Agency:     HH Arranged:    HH Agency:     Status of Service:  In process, will continue to follow  Medicare Important Message Given:    Date Medicare IM Given:    Medicare IM give by:    Date Additional Medicare IM Given:    Additional Medicare Important Message give by:     If discussed at Long Length of Stay Meetings, dates discussed:    Additional Comments:  Chapman Fitch, RN 01/08/2015, 11:50 AM

## 2015-01-08 NOTE — Progress Notes (Signed)
Subjective:  Patient's sleeping comfortably postoperatively. Patient was not awoken.  Objective:   VITALS:   Filed Vitals:   01/08/15 1649 01/08/15 1704 01/08/15 1718 01/08/15 1732  BP: 168/63 162/66 156/68 163/66  Pulse: 84 85 83 82  Temp: 98.6 F (37 C) 98.8 F (37.1 C) 98.6 F (37 C)   TempSrc: Oral Oral Oral   Resp: 16 18 16 18   Height:      Weight:      SpO2: 94% 94% 96% 96%    PHYSICAL EXAM: Deferred due to patient's sleeping.   LABS  Results for orders placed or performed during the hospital encounter of 01/07/15 (from the past 24 hour(s))  Glucose, capillary     Status: Abnormal   Collection Time: 01/07/15  9:37 PM  Result Value Ref Range   Glucose-Capillary 212 (H) 65 - 99 mg/dL   Comment 1 Notify RN   MRSA PCR Screening     Status: None   Collection Time: 01/07/15 11:20 PM  Result Value Ref Range   MRSA by PCR NEGATIVE NEGATIVE  CBC     Status: Abnormal   Collection Time: 01/08/15  6:28 AM  Result Value Ref Range   WBC 11.5 (H) 3.6 - 11.0 K/uL   RBC 4.15 3.80 - 5.20 MIL/uL   Hemoglobin 12.0 12.0 - 16.0 g/dL   HCT 16.1 09.6 - 04.5 %   MCV 85.1 80.0 - 100.0 fL   MCH 29.0 26.0 - 34.0 pg   MCHC 34.1 32.0 - 36.0 g/dL   RDW 40.9 81.1 - 91.4 %   Platelets 214 150 - 440 K/uL  Basic metabolic panel     Status: Abnormal   Collection Time: 01/08/15  6:28 AM  Result Value Ref Range   Sodium 139 135 - 145 mmol/L   Potassium 4.1 3.5 - 5.1 mmol/L   Chloride 108 101 - 111 mmol/L   CO2 24 22 - 32 mmol/L   Glucose, Bld 197 (H) 65 - 99 mg/dL   BUN 17 6 - 20 mg/dL   Creatinine, Ser 7.82 0.44 - 1.00 mg/dL   Calcium 8.5 (L) 8.9 - 10.3 mg/dL   GFR calc non Af Amer 57 (L) >60 mL/min   GFR calc Af Amer >60 >60 mL/min   Anion gap 7 5 - 15  Glucose, capillary     Status: Abnormal   Collection Time: 01/08/15  7:39 AM  Result Value Ref Range   Glucose-Capillary 180 (H) 65 - 99 mg/dL   Comment 1 Notify RN   Glucose, capillary     Status: Abnormal   Collection Time:  01/08/15 11:28 AM  Result Value Ref Range   Glucose-Capillary 146 (H) 65 - 99 mg/dL   Comment 1 Notify RN     Dg Chest 1 View  01/07/2015   CLINICAL DATA:  Patient status post fall. Right hip pain and possible fracture.  EXAM: CHEST  1 VIEW  COMPARISON:  None.  FINDINGS: Normal cardiac and mediastinal contours. No consolidative pulmonary opacities. No pleural effusion or pneumothorax.  IMPRESSION: No acute cardiopulmonary process.   Electronically Signed   By: Annia Belt M.D.   On: 01/07/2015 14:45   Dg Pelvis Portable  01/08/2015   CLINICAL DATA:  Repair hip fracture.  EXAM: PORTABLE PELVIS 1-2 VIEWS  COMPARISON:  01/07/2015  FINDINGS: Exam demonstrates placement of a right hip prosthesis which is intact and normally located. Skin staples are present over the lateral soft tissues of the right hip. There  is minimal degenerative change of the left hip. Remainder of the exam is unchanged.  IMPRESSION: Interval placement of right hip prosthesis intact and normally located.   Electronically Signed   By: Elberta Fortis M.D.   On: 01/08/2015 17:18   Dg Hip Port Unilat With Pelvis 1v Right  01/08/2015   CLINICAL DATA:  Status post total hip replacement.  EXAM: DG HIP (WITH OR WITHOUT PELVIS) 1V PORT RIGHT  COMPARISON:  01/07/2015  FINDINGS: There has been interval total right hip arthroplasty, with long stem femoral component. The previously demonstrated right femoral neck fracture is no longer seen. The hardware alignment is near anatomic. Expected soft tissue edema and emphysema, and skin staples are seen. Calcified fibroids are noted.  IMPRESSION: Status post right total hip arthroplasty, with prosthetic hardware in good alignment.   Electronically Signed   By: Ted Mcalpine M.D.   On: 01/08/2015 16:40   Dg Hip Unilat With Pelvis 2-3 Views Right  01/07/2015   CLINICAL DATA:  Pain following fall  EXAM: DG HIP (WITH OR WITHOUT PELVIS) 2-3V RIGHT  COMPARISON:  None.  FINDINGS: Frontal pelvis as well  as frontal and lateral right hip images obtained. There is a subcapital femoral neck fracture on the right in near anatomic alignment. There is mild impaction in this area. No dislocation. No other fractures are apparent. There is moderate narrowing of both hip joints. Bones are osteoporotic.  There are calcified uterine leiomyomas in the right pelvis. Rectum is borderline distended with stool.  IMPRESSION: Subcapital femoral neck fracture on the right with mild impaction at the fracture site. Moderate narrowing both hip joints. Bones osteoporotic. No dislocation.   Electronically Signed   By: Bretta Bang III M.D.   On: 01/07/2015 14:43    Assessment/Plan: Day of Surgery   Active Problems:   Closed right hip fracture  Patient is resting comfortably postop. She will complete 24 hours of postop antibiotics. Encourage incentive spirometry while awake. Physical and occupational therapy will begin tomorrow. Foley catheter will be removed tomorrow. I reviewed the postoperative x-rays which demonstrate the hip prosthesis is well positioned and leg lengths are equivalent. Continue abduction pillow while patient in bed. Patient will be partial weightbearing on the right lower extremity for 4 weeks postop. Patient will begin Lovenox for DVT prophylaxis in the morning. She is ordered for her TED stockings and foot pumps to begin immediately postop. Will recheck labs in the morning.    [name] , MD 01/08/2015, 5:59 PM

## 2015-01-08 NOTE — Anesthesia Preprocedure Evaluation (Signed)
Anesthesia Evaluation  Patient identified by MRN, date of birth, ID band Patient awake    Reviewed: Allergy & Precautions, H&P , NPO status , Patient's Chart, lab work & pertinent test results, reviewed documented beta blocker date and time   History of Anesthesia Complications Negative for: history of anesthetic complications  Airway Mallampati: II  TM Distance: >3 FB Neck ROM: full    Dental  (+) Missing, Poor Dentition   Pulmonary neg pulmonary ROS,  breath sounds clear to auscultation  Pulmonary exam normal       Cardiovascular Exercise Tolerance: Good hypertension, On Medications - angina- CAD, - Past MI, - Cardiac Stents and - CABG Normal cardiovascular exam- dysrhythmias - Valvular Problems/MurmursRhythm:regular Rate:Normal     Neuro/Psych PSYCHIATRIC DISORDERS (Alzheimer's dementia) negative neurological ROS     GI/Hepatic negative GI ROS, Neg liver ROS,   Endo/Other  diabetesHypothyroidism Morbid obesity  Renal/GU negative Renal ROS  negative genitourinary   Musculoskeletal   Abdominal   Peds  Hematology negative hematology ROS (+)   Anesthesia Other Findings Past Medical History:   Alzheimer's dementia                                         Diabetes type 2, controlled                                  Hypothyroidism                                               Vision impairment                                            Hearing impairment                                           Hypertension                                                 Unsteady gait                                                Reproductive/Obstetrics negative OB ROS                             Anesthesia Physical Anesthesia Plan  ASA: III  Anesthesia Plan: General   Post-op Pain Management:    Induction:   Airway Management Planned:   Additional Equipment:   Intra-op Plan:    Post-operative Plan:   Informed Consent: I have reviewed the patients History and Physical, chart, labs and discussed the procedure including the risks, benefits and alternatives for the proposed anesthesia with the patient  or authorized representative who has indicated his/her understanding and acceptance.   Dental Advisory Given  Plan Discussed with: Anesthesiologist, CRNA and Surgeon  Anesthesia Plan Comments:         Anesthesia Quick Evaluation

## 2015-01-08 NOTE — Op Note (Signed)
01/07/2015 - 01/08/2015  3:43 PM  PATIENT:  [name]   MRN: [id number]  PRE-OPERATIVE DIAGNOSIS:  Right femoral neck hip fracture  POST-OPERATIVE DIAGNOSIS:  Right femoral neck hip fracture  PROCEDURE:  Procedure(s): RIGHT HIP HEMIARTHROPLASTY  PREOPERATIVE INDICATIONS:    [name] is an 81 y.o. female who has a diagnosis of hip fracture after a fall yesterday. She was brought to the [facility] emergency Department where she was diagnosed with a right femoral neck hip fracture by x-ray. I spoke with the patient and her daughter in [place] by phone. I recommended a right hip hemiarthroplasty given the displacement of her femoral head. The patient and her daughter agreed with surgical management of his fracture.  The risks benefits and alternatives were discussed with the patient and their family including but not limited to the risks of  infection requiring removal of the prosthesis, bleeding requiring blood transfusion, nerve injury especially to the sciatic nerve leading to foot drop or lower extremity numbness, periprosthetic fracture, dislocation, leg length discrepancy, change in lower extremity rotation persistent hip pain, loosening or failure of the components and the need for revision surgery. Medical risks include but are not limited to DVT and pulmonary wasn't, myocardial infarction, stroke, pneumonia, respiratory failure and death.  OPERATIVE REPORT     SURGEON:  Thornton Park, MD    ANESTHESIA:  Gen.    COMPLICATIONS:  None.   SPECIMEN: Femoral head to pathology    COMPONENTS:  Stryker Accolade TMZF femoral component size 2.5 with a 46 mm Stryker Unitrax head with a -4 neck adjustment sleeve    PROCEDURE IN DETAIL:   The patient was met in the holding area and  identified.  The appropriate hip was identified and marked at the operative site after verbally confirming with the patient that this was the correct site of surgery.  The patient was then  transported to the OR  and  underwent general anesthesia.  The patient was then placed in the lateral decubitus position with the operative side up and secured on the operating room table with a pegboard and all bony prominences were adequately padded. This included an axillary roll and additional padding around the nonoperative leg to prevent compression to the common peroneal nerve.    The operative lower extremity was prepped and draped in a sterile fashion.  A time out was performed prior to incision to verify patient's name, date of birth, medical record number, correct site of surgery correct procedure to be performed. Was also used to verify the patient received antibiotics now appropriate instruments, implants and radiographic studies were available in the room. Once all in attendance were in agreement case began. Patient received 2 g of prior to the incision.    A posterolateral approach was utilized via sharp dissection  carried down to the subcutaneous tissue.  Bleeding vessels were coagulated using electrocautery.  The fascia lata was identified and incised along the length of the skin incision.  The gluteus maximus muscle was then split in line with its fibers. Self-retaining retractors were  inserted.  With the hip internally rotated, the short external rotators  were identified and removed from the posterior attachment from the greater trochanter. The piriformis was tagged for later repair. The capsule was identified and a T-shaped capsulotomy was performed. The capsule was tagged with #2 Tycron for later repair.  The femoral neck fracture was exposed, and the femoral head was removed using a corkscrew device. This was measured to be 46  mm in diameter. The attention was then turned to proximal femur preparation.  An oscillating saw was used to perform a proximal femoral osteotomy 1 fingerbreadth above the lesser trochanter. The trial 46 mm femoral head was placed into the acetabulum and had an  excellent suction fit. The attention was then turned back to femoral preparation.    A femoral skid and Cobra retractor were placed under the femoral neck to allow for adequate visualization. A box osteotome was used to make the initial entry into the proximal femur. A single hand reamer was used to prepare the femoral canal. A T-shaped femoral canal sounder was then used to ensure no penetration femoral cortex had occurred during reaming. The proximal femur was then sequentially broached by hand. A 2.5 femoral trial was found to have the best medial lateral fit. Once adequate mediolateral canal fill was achieved the trial femoral broach, neck, and head was assembled and the hip was reduced. It was found to have excellent stability, equivalent leg lengths with functional range of motion. The trial components were then removed.  I copiously irrigated the femoral canal and then impacted the real femoral prosthesis into place into the appropriate version, slightly anteverted to the normal anatomy, and I impacted the actual 46 mm Unitrax femoral component with a -4 neck adjustment sleeve into place. The hip was then reduced and taken through functional range of motion and found to have excellent stability. Leg lengths were restored.   The hip joint was copiously irrigated.   A soft tissue repair of the capsule  was performed using #2 Tycron.  Excellent posterior capsular repair was achieved. The fascia lata was then closed with interrupted 0 Vicryl suture. The subcutaneous tissues were closed with 2-0 Vicryl and the skin approximated with staples.   The patient was then placed supine on the operative table. Leg lengths were checked clinically and found to be equivalent. An abduction pillow was placed between the lower extremities. The patient was then transferred to a hospital bed and brought to the PACU in stable condition. I was scrubbed and present the entire case and all sharp and instrument counts were  correct at the conclusion of the case. I spoke with the patient's daughter by phone in [place] from the PACU to let her know the case had gone without complication patient was stable in recovery room.   Timoteo Gaul, MD Orthopedic Surgeon

## 2015-01-08 NOTE — Progress Notes (Signed)
[facility] [place] at [facility]   PATIENT NAME: [name]    MR#:  [id number]  DATE OF BIRTH:  27-May-1927  SUBJECTIVE:  CHIEF COMPLAINT:   Chief Complaint  Patient presents with  . Fall   Pain right hip if moved. Waiting for surgery  REVIEW OF SYSTEMS:    Review of Systems  Constitutional: Positive for malaise/fatigue. Negative for fever and chills.  HENT: Negative for sore throat.   Eyes: Negative for blurred vision, double vision and pain.  Respiratory: Negative for cough, hemoptysis, shortness of breath and wheezing.   Cardiovascular: Negative for chest pain, palpitations, orthopnea and leg swelling.  Gastrointestinal: Negative for heartburn, nausea, vomiting, abdominal pain, diarrhea and constipation.  Genitourinary: Negative for dysuria and hematuria.  Musculoskeletal: Positive for back pain and joint pain.  Skin: Negative for rash.  Neurological: Positive for headaches. Negative for sensory change, speech change and focal weakness.  Endo/Heme/Allergies: Does not bruise/bleed easily.  Psychiatric/Behavioral: Negative for depression. The patient is not nervous/anxious.       DRUG ALLERGIES:  No Known Allergies  VITALS:  Blood pressure 155/78, pulse 101, temperature 100.1 F (37.8 C), temperature source Oral, resp. rate 16, height 5\' 3"  (1.6 m), weight 63.095 kg (139 lb 1.6 oz), SpO2 96 %.  PHYSICAL EXAMINATION:   Physical Exam  GENERAL:  81 y.o.-year-old patient lying in the bed with no acute distress.  EYES: Pupils equal, round, reactive to light and accommodation. No scleral icterus. Extraocular muscles intact.  HEENT: Head atraumatic, normocephalic. Oropharynx and nasopharynx clear.  NECK:  Supple, no jugular venous distention. No thyroid enlargement, no tenderness.  LUNGS: Normal breath sounds bilaterally, no wheezing, rales, rhonchi. No use of accessory muscles of respiration.  CARDIOVASCULAR: S1, S2 normal. No murmurs,  rubs, or gallops.  ABDOMEN: Soft, nontender, nondistended. Bowel sounds present. No organomegaly or mass.  EXTREMITIES: No cyanosis, clubbing or edema b/l.    NEUROLOGIC: Cranial nerves II through XII are intact. No focal Motor or sensory deficits b/l.   PSYCHIATRIC: The patient is alert and oriented x 3.  SKIN: No obvious rash, lesion, or ulcer.   Right hip tender  LABORATORY PANEL:   CBC  Recent Labs Lab 01/08/15 0628  WBC 11.5*  HGB 12.0  HCT 35.3  PLT 214   ------------------------------------------------------------------------------------------------------------------  Chemistries   Recent Labs Lab 01/08/15 0628  NA 139  K 4.1  CL 108  CO2 24  GLUCOSE 197*  BUN 17  CREATININE 0.90  CALCIUM 8.5*   ------------------------------------------------------------------------------------------------------------------  Cardiac Enzymes No results for input(s): TROPONINI in the last 168 hours. ------------------------------------------------------------------------------------------------------------------  RADIOLOGY:  Dg Chest 1 View  01/07/2015   CLINICAL DATA:  Patient status post fall. Right hip pain and possible fracture.  EXAM: CHEST  1 VIEW  COMPARISON:  None.  FINDINGS: Normal cardiac and mediastinal contours. No consolidative pulmonary opacities. No pleural effusion or pneumothorax.  IMPRESSION: No acute cardiopulmonary process.   Electronically Signed   By: Annia Belt M.D.   On: 01/07/2015 14:45   Dg Hip Unilat With Pelvis 2-3 Views Right  01/07/2015   CLINICAL DATA:  Pain following fall  EXAM: DG HIP (WITH OR WITHOUT PELVIS) 2-3V RIGHT  COMPARISON:  None.  FINDINGS: Frontal pelvis as well as frontal and lateral right hip images obtained. There is a subcapital femoral neck fracture on the right in near anatomic alignment. There is mild impaction in this area. No dislocation. No other fractures are apparent. There is moderate narrowing  of both hip joints. Bones  are osteoporotic.  There are calcified uterine leiomyomas in the right pelvis. Rectum is borderline distended with stool.  IMPRESSION: Subcapital femoral neck fracture on the right with mild impaction at the fracture site. Moderate narrowing both hip joints. Bones osteoporotic. No dislocation.   Electronically Signed   By: Bretta Bang III M.D.   On: 01/07/2015 14:43     ASSESSMENT AND PLAN:   81 year old female with past medical history of Alzheimer's dementia, diabetes, hypertension, hypothyroidism who presents to the hospital from assisted living after you're fall and noted to have a right hip fracture.  * Right hip fracture. Patient is scheduled for surgery later today. DVT prophylaxis, physical therapy and discharge planning per orthopedics.  # diabetes type 2 without complication-continue Levemir, sliding scale insulin.  # hypothyroidism-continue Synthroid.  # hypertension-continue lisinopril.  # hyperlipidemia-continue simvastatin.  All the records are reviewed and case discussed with Care Management/Social Workerr. Management plans discussed with the patient, family and they are in agreement.  CODE STATUS: FULL  DVT Prophylaxis: SCDs  TOTAL TIME TAKING CARE OF THIS PATIENT: 35 minutes.   POSSIBLE D/C IN 2-3 DAYS, DEPENDING ON CLINICAL CONDITION.   Milagros Loll R M.D on 01/08/2015 at 12:42 PM  Between 7am to 6pm - Pager - [phone]  After 6pm go to [email or web address] - password [email or web address] [facility]  [facility]  Office  [phone]  CC: Primary care physician; CAMPBELL, Kristen Loader, FNP

## 2015-01-08 NOTE — Progress Notes (Signed)
Pt. Dangled on side of bed. Tolerated dangle well with some pain. Oxycodone  PO administered after pt. Was returned to bed.

## 2015-01-08 NOTE — Transfer of Care (Signed)
Immediate Anesthesia Transfer of Care Note  Patient: [name]  Procedure(s) Performed: Procedure(s): ARTHROPLASTY BIPOLAR HIP (HEMIARTHROPLASTY) (Right)  Patient Location: PACU  Anesthesia Type:General  Level of Consciousness: awake and alert   Airway & Oxygen Therapy: Patient Spontanous Breathing and Patient connected to face mask oxygen  Post-op Assessment: Report given to RN and Post -op Vital signs reviewed and stable  Post vital signs: Reviewed and stable  Last Vitals:  Filed Vitals:   01/08/15 0738  BP: 155/78  Pulse: 101  Temp: 37.8 C  Resp: 16  1540 VS: BP 146/65 Pulse 87 Resp 20 Temp 99.1  Complications: No apparent anesthesia complications

## 2015-01-09 ENCOUNTER — Encounter: Payer: Self-pay | Admitting: Orthopedic Surgery

## 2015-01-09 ENCOUNTER — Inpatient Hospital Stay: Payer: Medicare Other

## 2015-01-09 LAB — GLUCOSE, CAPILLARY
GLUCOSE-CAPILLARY: 159 mg/dL — AB (ref 65–99)
Glucose-Capillary: 177 mg/dL — ABNORMAL HIGH (ref 65–99)
Glucose-Capillary: 200 mg/dL — ABNORMAL HIGH (ref 65–99)
Glucose-Capillary: 245 mg/dL — ABNORMAL HIGH (ref 65–99)

## 2015-01-09 LAB — URINALYSIS COMPLETE WITH MICROSCOPIC (ARMC ONLY)
Bacteria, UA: NONE SEEN
Bilirubin Urine: NEGATIVE
Glucose, UA: >500 mg/dL — AB
Hgb urine dipstick: NEGATIVE
Leukocytes, UA: NEGATIVE
Nitrite: NEGATIVE
Protein, ur: NEGATIVE mg/dL
Specific Gravity, Urine: 1.015 (ref 1.005–1.030)
Squamous Epithelial / LPF: NONE SEEN
pH: 5 (ref 5.0–8.0)

## 2015-01-09 LAB — BASIC METABOLIC PANEL
ANION GAP: 8 (ref 5–15)
BUN: 11 mg/dL (ref 6–20)
CALCIUM: 8 mg/dL — AB (ref 8.9–10.3)
CHLORIDE: 104 mmol/L (ref 101–111)
CO2: 24 mmol/L (ref 22–32)
Creatinine, Ser: 0.88 mg/dL (ref 0.44–1.00)
GFR calc non Af Amer: 58 mL/min — ABNORMAL LOW (ref >60)
Glucose, Bld: 212 mg/dL — ABNORMAL HIGH (ref 65–99)
POTASSIUM: 3.8 mmol/L (ref 3.5–5.1)
Sodium: 136 mmol/L (ref 135–145)

## 2015-01-09 LAB — URINE CULTURE: Culture: NO GROWTH

## 2015-01-09 LAB — CBC
HEMATOCRIT: 30.8 % — AB (ref 35.0–47.0)
HEMOGLOBIN: 10.3 g/dL — AB (ref 12.0–16.0)
MCH: 28.9 pg (ref 26.0–34.0)
MCHC: 33.6 g/dL (ref 32.0–36.0)
MCV: 85.9 fL (ref 80.0–100.0)
Platelets: 178 10*3/uL (ref 150–440)
RBC: 3.58 MIL/uL — AB (ref 3.80–5.20)
RDW: 13.3 % (ref 11.5–14.5)
WBC: 11.7 10*3/uL — AB (ref 3.6–11.0)

## 2015-01-09 NOTE — Evaluation (Signed)
Occupational Therapy Evaluation Patient Details Name: [name] MRN: [id number] DOB: 03-27-1928 Today's Date: 01/09/2015    History of Present Illness This patient is an 81 year old female who came to [facility] after a fall at her assisted living facility. She suffered a R hip fracture and recieved a hemiarthroplasty repair.    Clinical Impression   She lives in assisted living but was able to dress and bathe herself. She now needs assist and would benefit from Occupational Therapy for ADL/functional mobility training while .staying within hip precautions.    Follow Up Recommendations  SNF    Equipment Recommendations    Hip kit   Recommendations for Other Services       Precautions / Restrictions Restrictions Weight Bearing Restrictions: Yes RLE Weight Bearing: Partial weight bearing      Mobility Bed Mobility                  Transfers                      Balance                                            ADL                                         General ADL Comments: Patient had been independent with basic ADL (dressing and bathing) Practiced techniques for lower body dressing using hip kit. Practiced Donned/doffed socks and pants to knees with hand over hand assist and verbal cues.     Vision     Perception     Praxis      Pertinent Vitals/Pain       Hand Dominance     Extremity/Trunk Assessment Upper Extremity Assessment Upper Extremity Assessment: Overall WFL for tasks assessed   Lower Extremity Assessment Lower Extremity Assessment: Defer to PT evaluation       Communication Communication Communication: No difficulties   Cognition Arousal/Alertness: Awake/alert (oriented to person situation and month) Behavior During Therapy: WFL for tasks assessed/performed Overall Cognitive Status: Within Functional Limits for tasks assessed       Memory: Decreased recall of precautions             General Comments       Exercises       Shoulder Instructions      Home Living Family/patient expects to be discharged to:: Skilled nursing facility                                        Prior Functioning/Environment          Comments: Patient had been independent with basic ADL such as dressing and bathing.    OT Diagnosis: Acute pain;Generalized weakness   OT Problem List:     OT Treatment/Interventions: Self-care/ADL training    OT Goals(Current goals can be found in the care plan section) Acute Rehab OT Goals Patient Stated Goal: Wants to go home OT Goal Formulation: With patient Time For Goal Achievement: 01/23/15  OT Frequency: Min 1X/week   Barriers to D/C:            Co-evaluation  End of Session Equipment Utilized During Treatment:  (Hip kit)  Activity Tolerance:   Patient left: in chair;with call bell/phone within reach;with chair alarm set   Time: 0950-1020 OT Time Calculation (min): 30 min Charges:  OT General Charges $OT Visit: 1 Procedure OT Evaluation $Initial OT Evaluation Tier I: 1 Procedure OT Treatments $Self Care/Home Management : 8-22 mins G-Codes:    Myrene Galas, MS/OTR/L  01/09/2015, 10:39 AM

## 2015-01-09 NOTE — Progress Notes (Signed)
Per Dr. Sudini via Telephone- "if Bladder scan greater than <MEASUREMENTZOXWRSoutheastern Ambulatory Surgery Center L1Z09809BraZOX:[id number] [phone]<MEASU16Marland Kitchen1096Consulting civil eng1Andre0ZOX16122w5da7973 Va [facility] - GainesvilWarn9[place]6SanMoundview Mem Hsptl And CliniKentuckycsMo1194Kat16101Lucia GaskErmaleXKG6045416JeKentuckyns S09Jimmey Ra40JXB:1Epimenio Foo76t51ZOXWR'U158615478L1[place]6140161PamZOXWZ57 HZOXDrema48NUUVWest Park Surgery Center LP914696 GrZOXWZ28OXZO409W2N56211Marland Kitchen6161097360829ZOX829161EGala Ro161057JXBJRosalie1Pigeon Forge56Hasbro Childrens Hospita402595818Sval[place] & Jan Mayen IsOdesNorton AudDerrell LolESolara Hospital M50116109Jeril[16104590470Marchelle FolksArchiLawerance BaShadelands Advanced Endoscopy Institute5956IKo34reaLucianne 56Alderso21614GrandMarina GoEinar [phone](308)[place]278-471610KatrinkaVernell Ba65A1161DraCascade Eye And Skin Centers PceAnice 4098Glory [address] Litt 1ZOXWRAvera Medical Group Worthington Surgetry Cent1Z09809BraZOX:094091609813[place]orPh[place] 16804-265-09601611610960<MEASU16Marland Kitchen1096Consulting civil eng1Andre0ZOX16122w5Healthcare Enterprises LLC Dba The Surgery CentWarn(839[place]6SanBon Secours Memorial Regional Medical CentKentuckyerMo1194Kat16101Lucia GaskErmaleXKG6045416JeKentuckyns S09Jimmey Ra40JXB:1Epimenio Foo44t61ZOXWR'U187615478L1[place]61401616PamZOXWZ31 East OaZOXDremaNUUVHendrick Medical Center91498ZOXWZ28OXZO409W2N56211Marland Kitchen6161094260829ZOX829161EGala Ro161084JXBJRosalie1Merrill326Western State Hospita4077945938Svalbard & Jan Mayen IsOdesHaxtun HospDerrell LolEWest Virginia University Hos87316109Jeril[16104580260Marchelle FolksArchiLawerance BaMendota Mental Hlth Insti5956Ko91reaLucianne 44Newcastl31614LMarina GoEinar [phone] [place]471 471610Katrinka BlaYelloVernell BaA16961161DraTallahassee Endoscopy CentereAnice 4098Glory 7983 Blue Sp 1ZOXWRBridgepoint National Harb1Z09809BraZOX:094091609812[place]orPh[place] [phone]<MEASU16Marland Kitchen1096Consulting civil eng1Andre0ZOX16157[facility] IWarn809[place]6SanAdvanced Endoscopy Center PLKentuckyLCMo1194Kat16101Lucia GaskErmaleXKG6045416JeKentuckyns S09Jimmey Ra40JXB:1Epimenio Foo10t31ZOXWR'U143615478L1[place]61401616PamZOXWZ900ZOXDrema(81NUUVMemorial Hermann [facility]914897ZOXWZ13OXZO409W2N56211Marland Kitchen6161093460829ZOX829161EGala Ro161077JXBJRosalie1Hartford676Endoscopic Ambulatory [facility] In4017916278Svalbard & Jan Mayen IsOdesFirsthealth Montgomery MemoDerrell LolEJennersville Regional Ho22416109Jeril[161047001009Marchelle FolksArchiLawerance BaTexas Health Hospital Clear5956oKo24reaLucianne 10Oquawk31614PaMarina GoEinar Grad6063663582e503 [place]571 471610Katrinka Vernell BaA5016161DraWalnut Hill Surgery CentereAnice 4098Glory 7310 Randall M 1ZOXWRBelleair Surgery Center L1Z09809BraZOX:094091609815[place]orPh[place] 16956-376-09821611610960<MEASU16Marland Kitchen1096Consulting civil eng1Andre0ZOX16158w5da77Black River Community Medical CentWarn9[place]ucky6SanLogan County HospitKentuckyalMo1194Kat16101Lucia GaskErmaleXKG6045416JeKentuckyns S09Jimmey Ra40JXB:Epimenio Foo84t51ZOXWR'U134615478L1[place]61401616PamZOXWZ88 DZOXDrema85NUUVBerkeley Endoscopy Center LLC9ZOXWZ36OXZO409W2N56211Marland Kitchen6161093160829ZOX829161EGala Ro161086JXBJRosalie1Coal Fork296[facility]ospita4076932448Svalbard & Jan Mayen IsOdesSurgicenter ODerrell LolEConnecticut Eye Surgery Center(90216109Jeril[16104180850Marchelle FolksArchiLawerance BaCornerstone Hospital Of Au5956tKo24reaLucianne 13Basi1614Marina GoEinar [phone]-[place]054-471610KatrinkaVernell BaA161959 South60 S161DraOregon Trail Eye Surgery CentereAnice 4098Glory [address] S 1ZOXWRBournewood Hospi1Z09809BraZOX:094091609817[place]orPh[place] 16(539)[phone]<MEASU16Marland Kitchen1096Consulting civil eng1Andre0ZOX16160w5daSoutheast Georgia Health System- Brunswick CampWarn739[place]6SanFairlawn Rehabilitation HospitKentuckyalMo1194Kat16101Lucia GaskErmaleXKG6045416JeKentuckyns S09Jimmey Ra40JXB:1Epimenio Foo70t31ZOXWR'U161615478L1[place]61401616PamZOXWZ956 LaZOXDrema(58NUUVWest Boca Medical Center9148939 North ZOXWZ28OXZO409W2N56211Marland Kitchen6161092560829ZOX829161EGala Ro161032JXBJRosalie1Okmulgee86Acadia-St. [facility] Hospita408193658Svalbard & Jan Mayen IsOdesPoplar CommuDerrell LolEKerrville State Ho(35116109Jeril[1610410508Marchelle FolksArchiLawerance BaMedical Center Surgery Associate5956 Ko27reaLucianne 89Howar51614PlaMarina GoEinar Grad3702-076-6136e(605)[place]794-471610Katrinka BVernell B64aA161DraMid Valley Surgery Center InceAnice 4098Glory 389 Ros 1ZOXWRArundel Ambulatory Surgery Cen1Z09809BraZOX:094091609813LorPh[place] 16859-762-34451611610960<MEASU16Marland Kitchen1096Consulting civil eng1Andre0ZOX16187w5da2Nexus Specialty Hospital - The WoodlanWarn339[place]6SanUniversity Of Louisville HospitKentuckyalMo1194Kat16101Lucia GaskErmaleXKG6045416JeKentuckyns S09Jimmey Ra40JXB:1Epimenio Foo43t71ZOXWR'U116615478L1[place]61401616PamZOXWZ71ZOXDrema(97NUUVBoulder Community Musculoskeletal Center9149983 EasZOXWZ56OXZO409W2N56211Marland Kitchen6161095360829ZOX829161EGala Ro161018JXBJRosalie1Grover896Clovis Surgery Center LL40196178Svalbard & Jan Mayen IsOdesNorth Point SDerrell LolESt Francis Ho91616109Jeril[161044103Marchelle FolksArchiLawerance BaPalms Behavioral He5956lKo22reaLucianne 62Western Gro1614SMarina GoEinar Grad3571 278 8840e(701) [place]716-471610Katrinka BlaFayettevVernell B12aA161DraSouth Arkansas Surgery CentereAnice [address] 1ZOXWRYoakum County Hospit1Z09809BraZOX:094091609817LorPhill [phone]<MEASU16Marland Kitchen1096Consulting civil eng1Andre0ZOX16139w5da659 LaShriners Hospital For ChildrWarn269[place]6SanEdward W Sparrow HospitKentuckyalMo1194Kat16101Lucia GaskErmaleXKG6045416JeKentuckyns S09Jimmey Ra40JXB:1Epimenio Foo77t31ZOXWR'U11615478L1[place]61401616PamZOXWZ75ZOXDrema74NUUVHumboldt General Hospital914412 ZOXWZ65OXZO409W2N56211Marland Kitchen6161093860829ZOX829161EGala Ro161045JXBJRosalie1Wheatfields136Epic Medical Cente4087972208Svalbard & Jan Mayen IsOdesMorehouse GenDerrell LolEOklahoma Outpatient Surgery Limited Partn48416109Jeril[161049Marchelle FolksArchiLawerance BaSaint Thomas West Hosp5956tKo67reaLucianne 42South Ambo71614LMarina GoEinar Grad3430-523-5809e670-[place]876-471610Katrinka BlaMorleVernell 29Ba161DraSequoia HospitaleAnice 4098Glory [address] 1ZOXWRBarnes-Jewish Hospit1Z09809BraZOX:094091609817LorPhill [phone]<MEASU16Marland Kitchen1096Consulting civil eng1Andre0ZOX16131w5Chi Health SchuylWarn409[place]6SanSentara Martha Jefferson Outpatient Surgery CentKentuckyerMo1194Kat16101Lucia GaskErmaleXKG6045416JeKentuckyns S09Jimmey Ra40JXB:1Epimenio Foo32t31ZOXWR'U137615478L1[place]61401616PamZOXWZ317B InZOXDrema26NUUVEmpire Surgery Center91456ZOXWZ49OXZO409W2N56211Marland Kitchen6161094660829ZOX829161EGala Ro161034JXBJRosalie1Me[place]aks346Riverwalk Ambulatory Surgery Cente4073934248Svalbard & Jan Mayen IsOdesAlbany Area HospiDerrell LolENoland Hospital An([phone][1610468062Marchelle FolksArchiLawerance BaCalifornia Colon And Rectal Cancer Screening Center5956LKo56reaLucianne 65Weavervill71614TMarina GoEinar [phone](518)[place]264-471610Katrinka BlaSVernell BaA16198307161DraNorth Central Bronx HospitaleAnice 4098Glory 838 Win 1ZOXWRSaint Joseph Health Services Of Rhode Isla1Z09809BraZOX:09409160981LorPhill [phone]<MEASU16Marland Kitchen1096Consulting civil eng1Andre0ZOX16119w5dEncompass Health Rehabilitation Hospital Of Co SpWarn729[place]6SanThe Surgical Center Of The Treasure CoaKentuckystMo1194Kat16101Lucia GaskErmaleXKG6045416JeKentuckyns S09Jimmey Ra40JXB:Epimenio Foo23t51ZOXWR'U176615478L1[place]61401616PamZOXWZ62 ZOXDremaNUUVTaylor Regional Hospital9149485 PlumZOXWZ27OXZO409W2N56211Marland Kitchen6161097160829ZOX829161EGala Ro161044JXBJRosalie1[place]le [place]326I-70 Community Hospita405095788Svalbard & Jan Mayen IsOdesFirsthealth Richmond MemoDerrell LolEEndoscopy Center At Robinwo([phone][161044Marchelle FolksArchiLawerance BaElite Surgery Center5956LKo40reaLucianne 78Christian1614Bradley Marina GoEinar Grad3308-749-8487e719-[place]225-471610Katrinka BlaClifton Vernell B27aA161DraCenter For Digestive Care LLCeAnice 4098Glory 7[address] Newbrid 1ZOXWRSt. Rose Dominican Hospitals - Rose De Lima Camp1Z09809BraZOX:094091609811LorPhill [phone]<MEASU16Marland Kitchen1096Consulting civil eng1Andre0ZOX16164w5da9Northwest Surgicare LWarn479[place]6SanKenmore Mercy HospitKentuckyalMo1194Kat16101Lucia GaskErmaleXKG6045416JeKentuckyns S09Jimmey Ra40JXB:1Epimenio Foo71t71ZOXWR'U173615478L1[place]61401616PamZOXWZ46 ZOXDrema98NUUVUpmc Shadyside-Er9148498 EastZOXWZ39OXZO409W2N56211Marland Kitchen6161091960829ZOX829161EGala Ro161024JXBJRosalie1Salt [place]ick536Highland Hospita4015916458Svalbard & Jan Mayen IsOdesJ. Paul JDerrell LolEHosp San Cri46916109Jeril[1610437076098Marchelle FolksArchiLawerance BaSurgicare Of Southern Hills5956IKo65reaLucianne 75Trou1614MicMarina GoEinar [phone](986)[place]414-471610KatrVernel10l 161DraMohawk Valley Ec LLCeAnice 4098Glory 4 [place] 1ZOXWRSaint James Hospit1Z09809BraZOX:094091609815LorPhill 16607 587 [phone]<MEASU16Marland Kitchen1096Consulting civil eng1Andre0ZOX16124w5da21Group Health Eastside HospitWarn779[place]6SanBaylor Scott & White Medical Center - PlaKentuckynoMo1194Kat16101Lucia GaskErmaleXKG6045416JeKentuckyns S09Jimmey Ra40JXB:1Epimenio Foo52t41ZOXWR'U168615478L1[place]61401616PamZOXWZ2ZOXDrema78NUUVBaptist Health Medical Center-Stuttgart91491ZOXWZ27OXZO409W2N56211Marland Kitchen6161091960829ZOX829161EGala Ro161057JXBJRosalie1[place]506Grand Strand Regional Medical Cente404497368Svalbard & Jan Mayen IsOdesSouthwestern Vermont MDerrell LolESt. Mary'S Heal94716109Jeril[16104820Marchelle FolksArchiLawerance BaAdventhealth Seb5956iKo31reaLucianne 82Palo Cedr11Marina GoEinar Grad2364-377-3977e(860)[place]067-471610KatriVernell BaA16191802161DraThe Surgery Center Of Alta Bates Summit [facility] LLCeAnice 4098Glory 17 Gro1610132213J09809840916142161161<MEASUREME086Ka62Mod161343701161161578161409(914914454ZOXZOX045161829BennyF83a29Texas5A36rSvalbard & Jan Mayen IsOdessa FleDerrell Loll981Lawerance BaSpicewood SurRoA16197Glo ZOXWRKettering Youth Servic1Z09809BraZOX:094091609811LorPhill [phone]<MEASU16Marland Kitchen1096Consulting civil eng1Andre0ZOX16119w5da7723Community Hospitals And Wellness Centers BryWarn(269[place]6SanTexas Institute For Surgery At Texas Health Presbyterian DallKentuckyasMo1194Kat16101Lucia GaskErmaleXKG6045416JeKentuckyns S09Jimmey Ra40JXB:1Epimenio Foo102t61ZOXWR'U132615478L1[place]61401616PamZOXWZ256ZOXDrema60NUUVGeorge Regional Hospital91ZOXWZ79OXZO409W2N56211Marland Kitchen6161094560829ZOX829161EGala Ro161034JXBJRosalie1Yosemite Lakes456Physicians Ambulatory Surgery Center LL4021942428Svalbard & Jan Mayen IsOdesAmbulatorDerrell LolELiberty Regional Medical (93116109Jeril[161046608709Marchelle FolksArchiLawerance BaBaylor Scott & White Mclane Children'S Medical Ce5956tKo2reaLucianne 48Mudd71614FrMarina GoEinar [phone](804) [place]745-471610Katrinka BlaGrVernell BaA3416161DraChi Health Richard Young Behavioral HealtheAnice 4098Glory 7993B Trus 1ZOXWRFranklin County Memorial Hospit1Z09809BraZOX:094091609813LorPhill 16410 [phone]<MEASU16Marland Kitchen1096Consulting civil eng1Andre0ZOX16136w5da2Ut Health [facility] Texas Rehabilitation HospitWarn259[place]6SanAustin Endoscopy Center Ii KentuckyLPMo1194Kat16101Lucia GaskErmaleXKG6045416JeKentuckyns S09Jimmey Ra40JXB:1Epimenio Foo56t51ZOXWR'U151615478L1[place]61401616PamZOXWZ7990 MarZOXDrema51NUUVRegency Hospital [facility]91419 ZOXWZ50OXZO409W2N56211Marland Kitchen6161097460829ZOX829161EGala Ro161067JXBJRosalie1Como736Chi [facility]. [facility] Health Syste4062951438Svalbard & Jan Mayen IsOdesUmass Memorial Medical Center - MeDerrell LolEDecatur Ambulatory Surgery 26716109Jeril[161042307Marchelle FolksArchiLawerance BaSurgicore Of [place] City5956LKo84reaLucianne 63Newpor21614JeffersMarina GoEinar Grad6215-222-8631e480-[place]809-471610Katrinka BlaUniversity HospiVernell Ba47A1161DraCommunity Memorial HospitaleAnice 4098Glory 34 Edge16119Endoscopy Center At St Ma1Z098098ZOX:0940916098171Phill 1681711039371610161<MEASUREMConsulting civil eng161090ZOX16109Ka615 PLitchfield Hills Surgery CentWarn(479604530Mo1194Kat16101Lucia GaskErmalene 1610909Jimmey Ra40JXB:1Epimenio Foot9148w0164615478L16140161611ZOXWZ85ZOXDrema46NUUVPoudre Valley Hospital9141ZOXWZ51OXZO40980451Marland Kitchen6WJ59XB829BennZOX829161Ea60416102492Rosalie1Clifton [place]61TexasIllinoisI60nSanpete Valley Hospita241lSvalbard & Jan Mayen IsOdesGarfield Park Derrell LolESamaritan Lebanon Community Ho161096[1610493050ArchiLawerance BaFulton County HospitKoreaLucianne 2Mus71614EMarina Good8([phone] BlaWilRoA1617161DraEndo Group LLC Dba Syosset SurgicenetereAnice 4098Glory 8244 Ridg 1ZOXWRSanford Hospital Webst1Z09809BraZOX:094091609816LorPhill 16([phone]<MEASU16Marland Kitchen1096Consulting civil eng1Andre0ZOX16163w5da9445 Iu Health University HospitWarn(789[place]6SanStaten Island University Hospital - SouKentuckythMo1194Kat16101Lucia GaskErmaleXKG6045416JeKentuckyns S09Jimmey Ra40JXB:1Epimenio Foo17t41ZOXWR'U161615478L1[place]61401616PamZOXWZ709 LoZOXDrema(93NUUVSierra Ambulatory Surgery Center A Medical Corporation91468ZOXWZ78OXZO409W2N56211Marland Kitchen6161095860829ZOX829161EGala Ro161047JXBJRosalie1Aberdeen536Nemaha Valley Community Hospita405493628Svalbard & Jan Mayen IsOdesPrairie Ridge HDerrell LolEAscension Seton Medical Center Will(56416109Jeril[16104290630981Marchelle FolksArchiLawerance BaGottsche Rehabilitation Ce5956tKo29reaLucianne 20Paincourtvill31614PaMarina GoEinar Grad5415-529-8028e386-[place]525-471610KatVernell 52Ba161DraEndoscopy Center Of El PasoeAnice 4098Glory [address] Stony 1ZOXWRScott County Hospit1Z09809BraZOX:094091609813LorPhill 16(937) [phone]<MEASU16Marland Kitchen1096Consulting civil eng1Andre0ZOX16172w5daHeritage Valley SewicklWarn719[place]6SanUk Healthcare Good Samaritan HospitKentuckyalMo1194Kat16101Lucia GaskErmaleXKG6045416JeKentuckyns S09Jimmey Ra40JXB:1Epimenio Foo74t41ZOXWR'U167615478L1[place]61401616PamZOXWZ9568ZOXDrema(43NUUVEndocenter LLC914790 ZOXWZ3OXZO409W2N56211Marland Kitchen6161096060829ZOX829161EGala Ro161087JXBJRosalie1Prud[place]756Western Pa Surgery Center Wexford Branch LL404699598Svalbard & Jan Mayen IsOdesNorthlake Derrell LolEBerks Center For Digestive (25016109Jeril[1610463028098Marchelle FolksArchiLawerance BaTown Center Asc5956LKo79reaLucianne 33Sloatsbur51614JacksMarina GoEinar Grad8(934) [phone]-[place]315-471610Katrinka BlaVa MediVernell B45aA161Dra436 [place] LLCeAnice 4098Glory 82 Fairgrou 1ZOXWRAlbany Medical Center - South Clinical Camp1Z09809BraZOX:094091609816LorPhill 16([phone]<MEASU16Marland Kitchen1096Consulting civil eng1Andre0ZOX16157wUc Medical Center PsychiatrWarn319[place]6SanBellevue Medical Center Dba Nebraska Medicine -Kentucky BMo1194Kat16101Lucia GaskErmaleXKG6045416JeKentuckyns S09Jimmey Ra40JXB:1Epimenio Foo47t81ZOXWR'U11615478L1[place]61401616PamZOXWZ9825ZOXDrema(94NUUVDe Queen Medical Center91499 NorthZOXWZ53OXZO409W2N56211Marland Kitchen6161094460829ZOX829161EGala Ro161045JXBJRosalie1[place]526Endoscopy Center Of [place] Lt4070921658Svalbard & Jan Mayen IsOdesEastern Oregon RegDerrell LolESagamore Surgical Servic(44516109Jeril[1610468Marchelle FolksArchiLawerance BaBoone Hospital Ce5956tKo67reaLucianne 5Anima1614ElMarina GoEinar Grad7301-077-8646e919-[place]199-471610KatrinVernell BaA1617938 Wes19t 161DraRutland Regional Medical CentereAnice 4098Glory 66 [place] 1ZOXWRChristus Santa Rosa Hospital - Alamo Heigh1Z09809BraZOX:094091609813LorPhill [phone])[phone]<MEASU16Marland Kitchen1096Consulting civil eng1Andre0ZOX16165w5da107Aurora Advanced Healthcare North Shore Surgical CentWarn819[place]6SanSharp Memorial HospitKentuckyalMo1194Kat16101Lucia GaskErmaleXKG6045416JeKentuckyns S09Jimmey Ra40JXB:1Epimenio Foo71t31ZOXWR'U134615478L1[place]61401616PamZOXWZ9855 RZOXDrema77NUUVBurke Medical Center9141ZOXWZ56OXZO409W2N56211Marland Kitchen6161097460829ZOX829161EGala Ro1610107JXBJRosalie1Villa del Sol66Kindred [facility] South Ba4039937208Svalbard & Jan Mayen IsOdesHyde Park SDerrell LolEChippewa County War Memorial Ho98016109Jeril[16104270Marchelle FolksArchiLawerance BaWatts Plastic Surgery Associatio5956 Ko43reaLucianne 73West Lin4161Marina GoEinar Grad([phone]-[place]055-471610Katrinka BlaCopper Queen DouVernell BaA14661161DraNovant Health Mint Hill Medical CentereAnice 4098Glory [address] 1ZOXWRCollege Medical Center Hawthorne Camp1Z09809BraZOX:094091609813LorPhill 16832-375-07411611610960<MEASU16Marland Kitchen1096Consulting civil eng1Andre0ZOX16177w5da9326 Big Granite County Medical CentWarn579[place]6SanAlfa Surgery CentKentuckyerMo1194Kat16101Lucia GaskErmaleXKG6045416JeKentuckyns S09Jimmey Ra40JXB:1Epimenio Foo51t61ZOXWR'U157615478L1[place]61401616PamZOXWZ8742 SW. RZOXDrema90NUUVSsm Health Cardinal Glenn[facility] Children'S [facility] Center91454 EaZOXWZ36OXZO409W2N56211Marland Kitchen6161093360829ZOX829161EGala Ro161043JXBJRosalie1Vinton446Yuma Rehabilitation Hospita4040993738Svalbard & Jan Mayen IsOdesSheridan Va MDerrell LolEPhysicians Choice Surgicent60516109Jeril[161041203909Marchelle FolksArchiLawerance BaAcoma-Canoncito-Laguna (Acl) Hosp5956tKo74reaLucianne 63Oak [place]31614GMarina GoEinar Grad312-102-9125e859-[place]027-471610Katrinka BlaVernell BaA16142131161DraBellin Memorial HsptleAnice 4098Glory 809 E. 1ZOXWRCascade Eye And Skin Centers 1Z09809BraZOX:094091609812LorPhill [phone]<MEASU16Marland Kitchen1096Consulting civil eng1Andre0ZOX16161w5da8Centro De Salud Integral De OrocovWarn909[place]6SanJefferson Surgery Center Cherry HiKentuckyllMo1194Kat16101Lucia GaskErmaleXKG6045416JeKentuckyns S09Jimmey Ra40JXB:Epimenio Foo64t51ZOXWR'U128615478L1[place]61401616PamZOXWZ80ZOXDremaNUUVNorth State Surgery Centers LP Dba Ct St Surgery CenterZOXWZ62OXZO409W2N56211Marland Kitchen6161093660829ZOX829161EGala Ro161054JXBJRosalie1[place]856[facility] [place] - Dba Shirley Ryan Abilityla4027972758Svalbard & Jan Mayen IsOdesMerced Ambulatory EndDerrell LolEMonroe County Ho64716109Jeril[16104550720981Marchelle FolksArchiLawerance BaGamma Surgery Ce5956tKo69reaLucianne 8Jopp1Marina GoEinar Grad3281-477-1215e712-[place]709-471610Katrinka BlaParkwoodVernell BaA6216161DraAscension Ne Wisconsin Mercy CampuseAnice 4098Glory 7719 Bish 1ZOXWRMidsouth Gastroenterology Group I1Z09809BraZOX:094091609812LorPhill 16845 [phone]<MEASU16Marland Kitchen1096Consulting civil eng1Andre0ZOX16159w5da7390Zachary - Amg Specialty HospitWarn719[place]6SanSurgcenter Of Western Maryland LKentuckyLCMo1194Kat16101Lucia GaskErmaleXKG6045416JeKentuckyns S09Jimmey Ra40JXB:1Epimenio Foo76t51ZOXWR'U183615478L1[place]ucky61401616PamZOXWZZOXDrema(31NUUVIntermountain Medical Center9149012 SZOXWZ40OXZO409W2N56211Marland Kitchen6161096660829ZOX829161EGala Ro161061JXBJRosalie1[place]oms[place]536Lakeside Ambulatory Surgical Center LL4024947178Svalbard & Jan Mayen IsOdesRiverside Regional MDerrell LolEBanner Peoria Surgery 72616109Jeril[161041605009Marchelle FolksArchiLawerance BaJewish 5956oKo34reaLucianne 23Pine Hi31614LaMarina GoEinar Grad6(714) [phone]-[place]143-471610Katrinka BlaWiVernell85 B161DraUnion Surgery Center InceAnice 4098Glory 9148 1ZOXWRFourth Corner Neurosurgical Associates Inc Ps Dba Cascade Outpatient Spine Cent1Z09809BraZOX:094091609812LorPhill 16(312) [phone]<MEASU16Marland Kitchen1096Consulting civil eng1Andre0ZOX16157w5da7675Accord Rehabilitaion HospitWarn949[place]6SanFirst Care Health CentKentuckyerMo1194Kat16101Lucia GaskErmaleXKG6045416JeKentuckyns S09Jimmey Ra40JXB:1Epimenio Foo53t21ZOXWR'U177615478L1[place]61401616PamZOXWZ33 StudZOXDrema(954NUUVTria Orthopaedic Center LLC91ZOXWZ52OXZO409W2N56211Marland Kitchen6161095260829ZOX829161EGala Ro161033JXBJRosalie1Cathay646Vantage Surgery Center L4086915818Svalbard & Jan Mayen IsOdesMahnomen Derrell LolEWhittier Pa84716109Jeril[16104490Marchelle FolksArchiLawerance BaAlameda Hospital-South Shore Convalescent Hosp5956tKo35reaLucianne 36Fort Lupt61Marina GoEinar Grad1([phone])[phone]-[place]670-471610Katrinka BlaEye Care SuVernell BaA16161161DraWisconsin Institute Of Surgical Excellence LLCeAnice 4098Glory 714 Bayb 1ZOXWRTrenton Psychiatric Hospi1Z09809BraZOX:094091609811LorPhill [phone]<MEASU16Marland Kitchen1096Consulting civil eng1Andre0ZOX16168w5da296 HiLLCrest Hospital HenryetWarn519[place]6SanThe Miriam HospitKentuckyalMo1194Kat16101Lucia GaskErmaleXKG6045416JeKentuckyns S09Jimmey Ra40JXB:1Epimenio Foo54t71ZOXWR'U172615478L1[place]61401616PamZOXWZ1 W. RidZOXDrema(56NUUVVa [facility] Center [facility] ZOXWZ22OXZO409W2N56211Marland Kitchen6161097760829ZOX829161EGala Ro161080JXBJRosalie1[facility]66Health Centra4069980678S[place] & Jan Mayen IsOdesEinstein Medical CentDerrell LolEFulton County Ho31616109Jeril[[id number] FolksArchiLawerance BaArapahoe Surgicenter5956LKo102reaLucianne 72Binge61614PinMarina GoEinar [phone](971)[place]218-471610Katrinka BlaSt Cloud CenVernell BaA7916161DraSullivan County Community HospitaleAnice 4098Glory 8 Ess 1ZOXWRPreston Surgery Center L1Z09809BraZOX:[id number] [phone]<MEASU16Marland Kitchen1096Consulting civil eng1Andre0ZOX16148w5da8437 CSignature Healthcare Brockton HospitWarn(459[place]6SanRidgeview Sibley Medical CentKentuckyerMo1194Kat16101Lucia GaskErmaleXKG6045416JeKentuckyns S09Jimmey Ra40JXB:1Epimenio Foo52t31ZOXWR'U195615478L1[place]61401616PamZOXWZ9813 RanZOXDrema(54NUUVSurgery Center At Regency Park91496ZOXWZ39OXZO409W2N56211Marland Kitchen6161095860829ZOX829161EGala Ro161050JXBJRosalie1[place]186[facility] Dba Rush Copley Medical Cente4032932428[place] & Jan Mayen IsOdesAltus Houston Hospital, [facility], OdyDerrell LolEFranklin Regional Medical 24816109Jeril[[id number] FolksArchiLawerance BaTourney Plaza Surgical Ce5956tKo31reaLucianne 51Beal Cit416Marina GoEinar [phone] [place]739 471610Katrinka BlaVa Vernel43l 161DraPhoebe Worth Medical CentereAnice 4098Glory 75 1ZOXWRSt Luke'S Hospit1Z09809BraZOX:[id number] [address] 652 [id number]<MEASU16Marland Kitchen1096Consulting civil eng1Andre0ZOX16119[facility] HospitWarn(509[place]6SanAssurance Health Hudson LKentuckyLCMo1194Kat16101Lucia GaskErmaleXKG6045416JeKentuckyns S09Jimmey Ra40JXB:1Epimenio Foo43t31ZOXWR'U165615478L1[place]61401616PamZOXWZ7280ZOXDrema(43NUUVNortheast Rehabilitation Hospital91ZOXWZ80OXZO409W2N56211Marland Kitchen6161091260829ZOX829161EGala Ro161028JXBJRosalie1Lindsay596Mountain Valley Regional Rehabilitation Hospita403190968[place] & Jan Mayen IsOdesStonewall Jackson MemoDerrell LolEOak Point Surgical [phone][[id number] FolksArchiLawerance BaLandmark Hospital Of Salt [facility] City5956LKo45reaLucianne 30Westcheste41614ZephMarina GoEinar [phone] [place]484 471610Katrinka BlaJohn R. OVernell BaA15161161DraSharon Regional Health SystemeAnice 4098Glory 524 Newb 1ZOXWRSaint Francis Hospital Muskog1Z09809BraZOX:[id number] [id number]<MEASU16Marland Kitchen1096Consulting civil eng1Andre0ZOX16119w5da6[facility] Northwest TucsWarn(879[place]6SanSt. Mary Regional Medical CentKentuckyerMo1194Kat16101Lucia GaskErmaleXKG6045416JeKentuckyns S09Jimmey Ra40JXB:1Epimenio Foo44t81ZOXWR'U172615478L1[place]61401616PamZOXWZ25 FZOXDrema40NUUVGood Samaritan Regional Health Center Mt Vernon91497ZOXWZ28OXZO409W2N56211Marland [id number] Ro161028JXBJRosalie1Alderton636[facility] Cente4044924478Svalbard & Jan Mayen IsOdesKansas SpineDerrell LolESalem Regional Medical ([phone][[id number] FolksArchiLawerance BaCommunity Memorial Hosp5956tKo53reaLucianne 2Buffalo Lak71614EnMarina GoEinar [phone](808)[place]246-471610KatrVernell BaA161376 N161DraJackson Parish HospitaleAnice [address] 1ZOXWRHelen Keller Memorial Hospit1Z09809BraZOX:094091609818[place]orPhill 16213-706-65071611610960<MEASU16Marland Kitchen1096Consulting civil eng1Andre0ZOX16138w5da4[facility] Medical CentWarn309[place]6SanVoa Ambulatory Surgery CentKentuckyerMo1194Kat16101Lucia GaskErmaleXKG6045416JeKentuckyns S09Jimmey Ra40JXB:1Epimenio Fo69ot1ZOXWR'U176615478L1[place]61401616PamZOXWZ116 ZOXDrema31NUUVSaint Francis Gi [facility] LLC9148ZOXWZ77OXZO409W2N56211Marland Kitchen6161096060829ZOX829161EGala Ro161062JXBJRosalie1Hixton286[facility] LL4052917978Svalbard & Jan Mayen IsOdesEncompass Health Rehabilitation HospitaDerrell LolETwin Valley Behavioral [phone][[id number] FolksArchiLawerance BaSpartanburg Hospital For Restorative 5956aKo12reaLucianne 49Pepper Pik5161Marina GoEinar Grad6548-456-9331e678-[place]081-471610Katrinka BlaVernell BaA161896 Sou66th161DraCumberland Hall HospitaleAnice [address] 1ZOXWRUchealth Greeley Hospit1Z09809BraZOX:094091609813LorPhill 1625432986681611610960<MEASU16Marland Kitchen1096Consulting civil eng1Andre0ZOX16135wFour Winds Hospital SaratoWarn659[place]6SanParkview Lagrange HospitKentuckyalMo1194Kat16101Lucia GaskErmaleXKG6045416JeKentuckyns S09Jimmey Ra40JXB:1Epimenio Foo73t71ZOXWR'U150615478L1[place]6140161PamZOXWZ3 GlZOXDrema(90NUUVEgnm LLC Dba [facility] Center9148810ZOXWZ25OXZO409W2N56211Marland Kitchen6161098960829ZOX829161EGala Ro161047JXBJRosalie1Granbury346[facility] [place] LL4071981768Svalbard & Jan Mayen IsOdesHolland Derrell LolELawrenceville Surgery Cent78716109Jeril[[id number] FolksArchiLawerance BaMarin Ophthalmic Surgery Ce5956tKo53reaLucianne 58Penn Wynn51614Green ValleMarina GoEinar Grad6317-547-1230e873-[place]669-471610Katrinka BlaWhite County MediVernell B48aA161DraStillwater Medical PerryeAnice [address] 1ZOXWRHi-Desert Medical Cent1Z09809BraZOX:094091609815[place]orPh[place] 16671-808-20221611610960<MEASU16Marland Kitchen1096Consulting civil eng1Andre0ZOX16163w5da9[facility] LWarn819[place]6SanLsu Bogalusa Medical Center (Outpatient CampuKentuckys)Mo1194Kat16101Lucia GaskErmaleXKG6045416JeKentuckyns S09Jimmey Ra40JXB:1Epimenio Foo64t81ZOXWR'U128615478L1[place]61401616PamZOXWZ9517 NZOXDrema66NUUVSouthern Idaho Ambulatory Surgery Center914908ZOXWZ61OXZO409W2N56211Marland Kitchen6161093760829ZOX829161EGala Ro16103JXBJRosalie1[place]486[facility] Hospita4027945818Svalbard & Jan Mayen IsOdesSelect Rehabilitation HospiDerrell LolECurahealth Heritage 46916109Jeril[[id number] FolksArchiLawerance BaThree Rivers Medical Ce5956tKo64reaLucianne 61Circle D-KC Estat161Marina GoEinar Grad75120251473e41[place]3006471610Katrinka BlaGood SamariVernell Ba47A1161DraShea Clinic Dba Shea Clinic AsceAnice [address] 1ZOXWRSt Lukes Hospital Of Bethleh1Z09809BraZOX:094091609817[place]orPh[place] 16708-656-52431611610960<MEASU16Marland Kitchen1096Consulting civil eng1Andre0ZOX16139w5da93[facility], NilWarn629[place]6SanSt Francis-EastsiKentuckydeMo1194Kat16101Lucia GaskErmaleXKG6045416JeKentuckyns S09Jimmey Ra40JXB:1Epimenio Foo67t41ZOXWR'U134615478L1[place]61401616PamZOXWZ353 BiZOXDrema23NUUVJack C. Montgomery Va [facility] Center9142ZOXWZ52OXZO409W2N56211Marland Kitchen6161097660829ZOX829161EGala Ro161014JXBJRosalie1[place]226Bdpec Asc Show Lo4049925608Svalbard & Jan Mayen IsOdesPerry County MemoDerrell LolEInov8 Su60316109Jeril[161046Marchelle FolksArchiLawerance BaLittleton Regional Health5956aKo13reaLucianne 59[place] Villag161Marina GoEinar Grad63140701696e340-[place]667-47161Vernell BaA17161161DraMetroeast Endoscopic Surgery CentereAnice [address]8Gl[address] 1ZOXWRHaxtun Hospital Distri1Z09809BraZOX:094091609811[place]orPh[place] 16352-711-01271611610960<MEASU16Marland Kitchen1096Consulting civil eng1Andre0ZOX16187wSt. Joseph Medical CentWarn719[place]6SanPhysicians Surgery CentKentuckyerMo1194Kat16101Lucia GaskErmaleXKG6045416JeKentuckyns S09Jimmey Ra40JXB:1Epimenio Foo39t3ZOXWR'U172615478L1[place]61401616PamZOXWZZOXDrema27NUUVWest [facility] Va Medical Center914928ZOXWZ35OXZO409W2N56211Marland Kitchen6161095460829ZOX829161EGala Ro161034JXBJRosalie1[place]446Rehabilitation Institute Of Michiga402291688Svalbard & Jan Mayen IsOdesPinnacle Cataract And Laser Derrell LolEMethodist Endoscopy Cent31016109Jeril[[id number] FolksArchiLawerance BaHosp Metropolitano De San 5956Ko30reaLucianne 97Alleen11614BiltmoreMarina GoEinar [phone](380)[place]041-471610Katrinka BlaCoVernell B55aA161DraEastern State HospitaleAnice [address] 1ZOXWRRestpadd Psychiatric Health Facili1Z09809BraZOX:094091609815[place]Ph[place] 16386-273-82621611610960<MEASU16Marland Kitchen1096Consulting civil eng1Andre0ZOX16121w5da195 N. [facility]  [facility] IWarn619[place]6SanSan Joaquin Valley Rehabilitation HospitKentuckyalMo1194Kat16101Lucia GaskErmaleXKG6045416JeKentuckyns S09Jimmey Ra40JXB:1Epimenio Foo61t11ZOXWR'U17615478L1[place]61401616PamZOXWZ682ZOXDrema72NUUVAdvance Endoscopy Center LLC914ZOXWZ15OXZO409W2N56211Marland Kitchen6161096460829ZOX829161EGala Ro161019JXBJRosalie1Hartford136Resurgens Surgery Center LL4013940668Svalbard & Jan Mayen IsOdesHawaii SDerrell LolEG Werber Bryan Psychiatric Ho916109Jeril[16104400Marchelle FolksArchiLawerance BaBaptist Health Rehabilitation Insti5956Ko25reaLucianne 66Spokane Valle31614Little Marina GoEinar Grad76573136720e940-[place]170-471610Katrinka BlVernell BaA13661161DraPhysicians Surgery Center Of [facility] LLC Dba Physicians Surgery Center Of TempeeAnice [address] 1ZOXWRWhitehall Surgery Cent1Z09809BraZOX:09409160981LorPhill 16431-770-28911611610960<MEASU16Marland Kitchen1096Consulting civil eng1Andre0ZOX16177w5da77[address] Clare Surgery CentWarn(619[place]6SanChristus Dubuis Hospital Of Port ArthKentuckyurMo1194Kat16101Lucia GaskErmaleXKG6045416JeKentuckyns S09Jimmey Ra40JXB:1Epimenio Foo70t71ZOXWR'U192615478L1[place]61401616PamZOXWZZOXDrema71NUUVHuey P. Long Medical Center914846 BZOXWZ40OXZO409W2N56211Marland Kitchen6161096660829ZOX829161EGala Ro161040JXBJRosalie1Watchtower716Kessler Institute For Rehabilitatio4069914428Svalbard & Jan Mayen IsOdesUniversity Pavilion - PsychiaDerrell LolEVa Medical Center - West Roxbury Di30116109Jeril[[id number] FolksArchiLawerance BaMercy Hospital - Fo5956Ko22reaLucianne 38Enlo1614DenaMarina GoEinar [phone]-[place]ucky835-471610Katrinka BlaMethodisVernell BaA16189328 W161DraOceans Behavioral Hospital Of KentwoodeAnice 4098Glory [address] 1ZOXWRPresentation Medical Cent1Z09809BraZOX:09409160981LorPhill 16973-325-05571611610960<MEASU16Marland Kitchen1096Consulting civil eng1Andre0ZOX1619w5da87Providence Centralia HospitWarn(239[place]6SanAnmed Health Cannon Memorial HospitKentuckyalMo1194Kat16101Lucia GaskErmaleXKG6045416JeKentuckyns S09Jimmey Ra40JXB:1Epimenio Foo59t21ZOXWR'U132615478L1[place]61401616PamZOXWZ25 ChZOXDrema77NUUVRiverside [facility] Hospital914ZOXWZ74OXZO409W2N56211Marland Kitchen6161093760829ZOX829161EGala Ro161027JXBJRosalie1Alsip376Beaver Dam Com Hspt40419238Svalbard & Jan Mayen IsOdesNacogdoches SDerrell LolERoseburg Va Medical 48016109Jeril[161045304Marchelle FolksArchiLawerance BaSheriff Al Cannon Detention Ce5956tKo30reaLucianne 46Heron Lak316Marina GoEinar [phone]-[place]181-471610Katrinka BlaBaptist MemVernell BaA161171161DraCrestwood Psychiatric Health Facility-CarmichaeleAnice [address] 1ZOXWRNovato Community Hospit1Z09809BraZOX:094091609815LorPhill 16(902)[phone]<MEASU16Marland Kitchen1096Consulting civil eng1Andre0ZOX16114w5da8745 WBaypointe Behavioral HealWarn9[place]ucky6SanMercy Medical Center-ClintKentuckyonMo1194Kat16101Lucia GaskErmaleXKG6045416JeKentuckyns S09Jimmey Ra40JXB:1Epimenio Foo57t51ZOXWR'U18615478L1[place]61401616PamZOXWZ9437 WashZOXDrema72NUUVSelect Specialty Hospital - [facility]91466ZOXWZ17OXZO409W2N56211Marland Kitchen6161098260829ZOX829161EGala Ro161081JXBJRosalie1[place]486[facility] Centra4041976288Svalbard & Jan Mayen IsOdesPacific Coast SurgiDerrell LolESt Luke'S Ho61216109Jeril[161048201Marchelle FolksArchiLawerance BaVentura Endoscopy Center5956LKo58reaLucianne 65Breckinridge Cent1016Marina GoEinar Grad3213-853-6054e509 [place]031 471610Katrinka Vernell B36aA161DraNew Lexington Clinic PsceAnice [address] 1ZOXWRTennova Healthcare - Shelbyvil1Z09809BraZOX:094091609816LorPhill [phone]<MEASU16Marland Kitchen1096Consulting civil eng1Andre0ZOX16179w5da6Va [facility] PoiWarn859[place]6SanClara Barton HospitKentuckyalMo1194Kat16101Lucia GaskErmaleXKG6045416JeKentuckyns S09Jimmey Ra40JXB:1Epimenio Foo89t81ZOXWR'U166615478L1[place]61401616PamZOXWZ577 PlZOXDrema(571NUUVBeacan Behavioral Health Bunkie914ZOXWZ30OXZO409W2N56211Marland Kitchen616109560829ZOX829161EGala Ro161044JXBJRosalie1Magnolia826[facility]osp Beeghl4064926168Svalbard & Jan Mayen IsOdesNew Cedar Lake Surgery Center LLC Dba The Surgery Center Derrell LolEHillsdale Community Health 56716109Jeril[16104750380981Marchelle FolksArchiLawerance BaTexas Health Surgery Center Alli5956nKo56reaLucianne 34Clear Lak71614Marina GoEinar Grad6930-097-3151e775 [place]209 471610Katrinka BlaCrosVernell BaA167219161DraSan Luis Obispo Surgery CentereAnice [address] 1ZOXWRBellin Health Marinette Surgery Cent1Z09809BraZOX:094091609816LorPhill 16828-165-64811611610960<MEASU16Marland Kitchen1096Consulting civil eng1Andre0ZOX16153w5da7Iu Health [facility]ulatory Surgery Center LWarn309[place]6SanHenrico Doctors' Hospital - ParhKentuckyamMo1194Kat16101Lucia GaskErmaleXKG6045416JeKentuckyns S09Jimmey Ra40JXB:1Epimenio Foo28t71ZOXWR'U19615478L1[place]61401616PamZOXWZ943 ZOXDrema61NUUVMei Surgery Center PLLC Dba [facility] Center9ZOXWZ57OXZO409W2N56211Marland Kitchen6161092960829ZOX829161EGala Ro161060JXBJRosalie1Searcy696Bhc [facility] Behavioral Health Cente4062958Svalbard & Jan Mayen IsOdesCrystal Clinic OrthoDerrell LolERiverview Ambulatory Surgical Cent416109Jeril[16104740550981Marchelle FolksArchiLawerance BaMclean Ambulatory Surgery5956LKo58reaLucianne 53Berin51614FrMarina GoEinar [phone](239)[place]272-471610Katrinka BlaCapital Health Vernell BaA1617278161DraManhattan Psychiatric CentereAnice 4098Glory [address] 1ZOXWREllis Hospital Bellevue Woman'S Care Center Divisi1Z09809BraZOX:094091609812LorPhill 16740 802 92221611610960<MEASU16Marland Kitchen1096Consulting civil eng1Andre0ZOX16122w5da7[address] Behavioral Health SystWarn(729[place]6SanThunderbird Endoscopy CentKentuckyerMo1194Kat16101Lucia GaskErmaleXKG6045416JeKentuckyns S09Jimmey Ra40JXB:Epimenio Foo86t21ZOXWR'U15615478L1[place]61401616PamZOXWZ8444 N.ZOXDrema71NUUVMidatlantic Endoscopy LLC Dba [facility] Iii9149920 EaZOXWZ2OXZO409W2N56211Marland Kitchen6161097960829ZOX829161EGala Ro161084JXBJRosalie1Hurdland726[facility] As4066924608Svalbard & Jan Mayen IsOdesFallsgrove EndoscoDerrell LolETioga Medical 416109Jeril[161042905Marchelle FolksArchiLawerance BaSt Luke Community Hospital -5956CKo12reaLucianne 14Rockfor91614PinMarina GoEinar [phone]rinka BlaTulVernell BaA1613053161DraIndiana University Health  Hospital InceAnice [address] 1ZOXWRSt Michaels Surgery Cent1Z09809BraZOX:094091609814LorPhill 1693687535041611610960<MEASU16Marland Kitchen1096Consulting civil eng1Andre0ZOX16143w5da346 IndStamford Memorial HospitWarn909[place]6SanBanner Ironwood Medical CentKentuckyerMo1194Kat16101Lucia GaskErmaleXKG6045416JeKentuckyns S09Jimmey Ra40JXB:1Epimenio Fo21ot1ZOXWR'U182615478L1[place]6140161PamZOXWZ81ZOXDrema36NUUVUltimate Health Services Inc9146ZOXWZ58OXZO409W2N56211Marland Kitchen6161095060829ZOX829161EGala Ro161072JXBJRosalie1[place]96Indiana University Health North Hospita4035943118Svalbard & Jan Mayen IsOdesGlen Echo SDerrell LolEWoodlands Behavioral 41716109Jeril[161046Marchelle FolksArchiLawerance BaSouthern Maine Medical Ce5956tKo19reaLucianne [address] Mil31614BrMarina GoEinar [phone](347)[place]133-471610Katrinka BlaRehabilitation Vernell Ba58A1161DraTourney Plaza Surgical CentereAnice [address]8Glory [address] 1ZOXWRPhysicians Choice Surgicenter I1Z09809BraZOX:094091609819LorPhill 16251-299-86761611610960<MEASU16Marland Kitchen1096Consulting civil eng1Andre0ZOX16134w5da4Physicians Surgicenter LWarn629[place]6SanHickory Trail HospitKentuckyalMo1194Kat16101Lucia GaskErmaleXKG6045416JeKentuckyns S09Jimmey Ra40JXB:1Epimenio Foo9t81ZOXWR'U159615478L1[place]61401616PamZOXWZ75ZOXDrema90NUUVLexington Regional Health Center91483ZOXWZ45OXZO409W2N56211Marland Kitchen6161091860829ZOX829161EGala Ro161072JXBJRosalie1[place]806Lincoln Medical Cente407692108Svalbard & Jan Mayen IsOdesAulDerrell LolEFrye Regional Medical 97016109Jeril[16104630720Marchelle FolksArchiLawerance BaGalleria Surgery Center5956LKo69reaLucianne 40New Cordel51614Marina GoEinar [phone] [place]795 471610Katrinka BlaBeltwayVernell B2aA161DraDecatur (Atlanta) Va Medical CentereAnice 4098Glory [address] Penin161096045ve.ren St.DTEXTTAG>

## 2015-01-09 NOTE — Care Management Important Message (Signed)
Important Message  Patient Details  Name: Gloria MordecaiKala Ambriz47 Date of Birth: November 11, 1927   Medicare Important Message Given:  Yes-second notification given    [name] A Allmond 01/09/2015, 1:56 PM

## 2015-01-09 NOTE — Anesthesia Postprocedure Evaluation (Signed)
  Anesthesia Post-op Note  Patient: [name]  Procedure(s) Performed: Procedure(s): ARTHROPLASTY BIPOLAR HIP (HEMIARTHROPLASTY) (Right)  Anesthesia type:General  Patient location: PACU  Post pain: Pain level controlled  Post assessment: Post-op Vital signs reviewed, Patient's Cardiovascular Status Stable, Respiratory Function Stable, Patent Airway and No signs of Nausea or vomiting  Post vital signs: Reviewed and stable  Last Vitals:  Filed Vitals:   01/09/15 0925  BP:   Pulse: 108  Temp:   Resp:     Level of consciousness: awake, alert  and patient cooperative  Complications: No apparent anesthesia complications

## 2015-01-09 NOTE — Clinical Social Work Placement (Signed)
   CLINICAL SOCIAL WORK PLACEMENT  NOTE  Date:  01/09/2015  Patient Details  Name: [name] MRN: [id number] Date of Birth: [birth year]  Clinical Social Work is seeking post-discharge placement for this patient at the Skilled  Nursing Facility level of care (*CSW will initial, date and re-position this form in  chart as items are completed):  Yes   Patient/family provided with [facility] Clinical Social Work Department's list of facilities offering this level of care within the geographic area requested by the patient (or if unable, by the patient's family).  Yes   Patient/family informed of their freedom to choose among providers that offer the needed level of care, that participate in Medicare, Medicaid or managed care program needed by the patient, have an available bed and are willing to accept the patient.  Yes   Patient/family informed of [facility]'s ownership interest in [facility] and [facility], as well as of the fact that they are under no obligation to receive care at these facilities.  PASRR submitted to EDS on 01/08/15     PASRR number received on 01/08/15     Existing PASRR number confirmed on       FL2 transmitted to all facilities in geographic area requested by pt/family on 01/09/15     FL2 transmitted to all facilities within larger geographic area on       Patient informed that his/her managed care company has contracts with or will negotiate with certain facilities, including the following:        Yes   Patient/family informed of bed offers received.  Patient chooses bed at  [facility] )     Physician recommends and patient chooses bed at      Patient to be transferred to   on  .  Patient to be transferred to facility by       Patient family notified on   of transfer.  Name of family member notified:        PHYSICIAN Please sign FL2     Additional Comment:    _______________________________________________ Haig Prophet,  LCSW 01/09/2015, 12:18 PM

## 2015-01-09 NOTE — Clinical Social Work Note (Signed)
Clinical Social Work Assessment  Patient Details  Name: [name] MRN: [id number] Date of Birth: 04-20-1928  Date of referral:  01/09/15               Reason for consult:  Facility Placement, Other (Comment Required) (From [name])                Permission sought to share information with:  [name] granted to share information::  Yes, Verbal Permission Granted  Name::      [name]::   [name]   Relationship::     Contact Information:     Housing/Transportation Living arrangements for the past 2 months:  [name] of Information:  Patient, Adult Children, Facility Patient Interpreter Needed:  None Criminal Activity/Legal Involvement Pertinent to Current Situation/Hospitalization:  No - Comment as needed Significant Relationships:  Adult Children Lives with:  Facility Resident Do you feel safe going back to the place where you live?  Yes Need for family participation in patient care:  Yes (Comment)  Care giving concerns: Patient is a resident at [name].    Social Worker assessment / plan: Holiday representative (CSW) received SNF consult. CSW met with patient to discuss D/C plan. Patient was alert and oriented and sitting in the chair. Patient is hard of hearing. Patient reported that she lived at [name] apartment complex for 17 years. Patient reported that she moved to [name] when she could no longer care for herself. Patient reported that she misses living at [name] and having her own apartment. Patient reported that her daughter [name] lives in [place]. CSW explained that she will have to go to rehab before returning to [facility]. Patient is agreeable to SNF search in [name].   CSW contacted patient's daughter [name]. Per daughter she would like to get patient placed in [place]. CSW explained that patient will need to  go to rehab locally first and build her strength before she can get in a car or plane per MD. CSW told daughter to research facilities in [place] and [place] would send referral and [facility] would have to follow up. Daughter is agreeable for patient going to rehab in [place]. CSW contacted [name] care coordinator at [facility] and made her aware of above.   CSW presented bed offers to patient. She chose [name]. CSW contacted [name] admissions coordinator at [name] and made him aware of accepted offer. Patient will likely D/C to [name] Saturday 01/11/15. Daughter, [name] at [facility] and MD are aware of above.   FL2 complete and on chart.   Employment status:  Retired Forensic scientist:  Information systems manager, Medicaid [place] PT Recommendations:  [name] / Referral to community resources:  [name]  Patient/Family's Response to care: Patient is agreeable to going to [name] for rehab.   Patient/Family's Understanding of and Emotional Response to Diagnosis, Current Treatment, and Prognosis: Patient was pleasant throughout assessment. Daughter thanked CSW for calling and assisting with placement.   Emotional Assessment Appearance:  Appears stated age Attitude/Demeanor/Rapport:    Affect (typically observed):  Accepting, Adaptable, Pleasant Orientation:  Oriented to Self, Oriented to Place, Oriented to  Time, Oriented to Situation Alcohol / Substance use:  Not Applicable Psych involvement (Current and /or in the community):  No (Comment)  Discharge Needs  Concerns to be addressed:  Discharge Planning Concerns Readmission within the last  30 days:  No Current discharge risk:  Dependent with Mobility Barriers to Discharge:  Continued Medical Work up   [name] 01/09/2015, 12:19 PM

## 2015-01-09 NOTE — Progress Notes (Signed)
Inpatient Diabetes Program Recommendations  AACE/ADA: New Consensus Statement on Inpatient Glycemic Control (2013)  Target Ranges:  Prepandial:   less than 140 mg/dL      Peak postprandial:   less than 180 mg/dL (1-2 hours)      Critically ill patients:  140 - 180 mg/dL   Reason for review: elevated lab glucose  Diabetes history: Type 2 Outpatient Diabetes medications: Levemir 20 units qhs, Metformin 1070md/day Current orders for Inpatient glycemic control: Levemir 20 units qhs, Novolog 0-5 units qhs  Lab glucose /dl this morning. Please consider ordering CBG tid and hs; may need to add Novolog correction with meals.  Consider obtaining an A1C.   Susette Racer, RN, BA, MHA, CDE Diabetes Coordinator Inpatient Diabetes Program  [phone] (Team Pager) [phone] [facility] Office) 01/09/2015 9:19 AM

## 2015-01-09 NOTE — Evaluation (Signed)
Physical Therapy Evaluation Patient Details Name: [name] MRN: [id number] DOB: 04/26/1928 Today's Date: 01/09/2015   History of Present Illness  This patient is an 81 year old female who came to [facility] after a fall at her assisted living facility. She suffered a R subcapital femoral neck hip fracture (with mild impaction at fx site) and s/p R hip hemiarthroplasty repair 01/08/15.   Clinical Impression  Currently pt demonstrates impairments with strength, balance, pain, and limitations with functional mobility.  Prior to admission, pt was ambulating with 4ww independently.  Pt lives at [facility].  Currently pt requires 2 assist for bed mobility and transfer bed to chair; pt unable to maintain PWB'ing with RW to attempt to take any steps.  Pt also requiring vc's and assist to maintain posterior hip precautions.  Pt would benefit from skilled PT to address above noted impairments and functional limitations.  Recommend pt discharge to STR when medically appropriate.     Follow Up Recommendations SNF    Equipment Recommendations  Rolling walker with 5" wheels    Recommendations for Other Services       Precautions / Restrictions Precautions Precautions: Fall;Posterior Hip Precaution Booklet Issued: Yes (comment) Restrictions Weight Bearing Restrictions: Yes RLE Weight Bearing: Partial weight bearing      Mobility  Bed Mobility Overal bed mobility: Needs Assistance;+2 for physical assistance Bed Mobility: Supine to Sit     Supine to sit: Max assist;+2 for physical assistance;HOB elevated     General bed mobility comments: assist for trunk and B LE's  Transfers Overall transfer level: Needs assistance Equipment used: Rolling walker (2 wheeled) Transfers: Sit to/from [employer] Sit to Stand: Mod assist;+2 physical assistance (with RW) Stand pivot transfers: Mod assist;Max assist;+2 physical assistance (no AD bed to recliner)           Ambulation/Gait             General Gait Details: pt unable to maintain PWB'ing status to safely perform gait so deferred  Stairs            Wheelchair Mobility    Modified Rankin (Stroke Patients Only)       Balance Overall balance assessment: Needs assistance Sitting-balance support: Bilateral upper extremity supported;Feet supported Sitting balance-Leahy Scale: Poor   Postural control: Posterior lean Standing balance support: Bilateral upper extremity supported Standing balance-Leahy Scale: Poor                               Pertinent Vitals/Pain Pain Assessment: 0-10 Pain Score: 8  Pain Location: R hip Pain Descriptors / Indicators: Aching;Sharp;Shooting Pain Intervention(s): Limited activity within patient's tolerance;Monitored during session;Premedicated before session;Repositioned  See flowsheet for HR and O2 vitals    Home Living Family/patient expects to be discharged to:: Skilled nursing facility     Type of Home: Assisted living [facility]) Home Access: Level entry       Home Equipment: Environmental consultant - 4 wheels;Shower seat      Prior Function Level of Independence: Needs assistance   Gait / Transfers Assistance Needed: Uses 4ww  ADL's / Homemaking Assistance Needed: Independent with dressing but requires assist for meals, bathing, laundry, cleaning  Comments: Patient had been independent with basic ADL such as dressing and bathing.     Hand Dominance        Extremity/Trunk Assessment   Upper Extremity Assessment: Generalized weakness  Lower Extremity Assessment: RLE deficits/detail;LLE deficits/detail RLE Deficits / Details: R hip flexion at least 2+/5; R knee flexion/extension at least 3-/5; R DF at least 3/5 LLE Deficits / Details: generalized weakness     Communication   Communication: No difficulties  Cognition Arousal/Alertness: Awake/alert Behavior During Therapy: WFL for tasks  assessed/performed Overall Cognitive Status: Within Functional Limits for tasks assessed (Oriented to person, place, time (except reported it was 2020 for year))       Memory: Decreased recall of precautions              General Comments General comments (skin integrity, edema, etc.): dressing intact  Nursing cleared pt for participation in physical therapy.  Pt agreeable to PT session.    Exercises   Performed semi-supine B LE therapeutic exercise x 10 reps:  Ankle pumps (AROM B LE's); quad sets x3 second holds (AROM B LE's); glute squeezes x3 second holds (AROM B); hip aDduction isometrics (pillow between pt's knees) x3 second holds; SAQ's (AAROM R; AROM L); heelslides (AAROM R; AAROM L), hip abd/adduction (AAROM R; AAROM L).  Pt required vc's and tactile cues for correct technique with exercises.       Assessment/Plan    PT Assessment Patient needs continued PT services  PT Diagnosis Difficulty walking;Acute pain   PT Problem List Decreased strength;Decreased activity tolerance;Decreased balance;Decreased mobility;Decreased knowledge of use of DME;Decreased safety awareness;Decreased knowledge of precautions;Pain  PT Treatment Interventions DME instruction;Gait training;Functional mobility training;Therapeutic activities;Therapeutic exercise;Balance training;Patient/family education   PT Goals (Current goals can be found in the Care Plan section) Acute Rehab PT Goals Patient Stated Goal: To go home PT Goal Formulation: With patient Time For Goal Achievement: 01/23/15 Potential to Achieve Goals: Fair    Frequency BID   Barriers to discharge Decreased caregiver support      Co-evaluation               End of Session Equipment Utilized During Treatment: Gait belt;Oxygen Activity Tolerance: Patient limited by fatigue;Patient limited by pain Patient left: in chair;with call bell/phone within reach;with chair alarm set;with nursing/sitter in room (nursing present and  reporting she would set pt up in chair after she gave pt medications) Nurse Communication: Mobility status;Precautions;Weight bearing status         Time: 1610-9604 PT Time Calculation (min) (ACUTE ONLY): 35 min   Charges:   PT Evaluation $Initial PT Evaluation Tier I: 1 Procedure PT Treatments $Therapeutic Exercise: 8-22 mins   PT G CodesHendricks Limes 01-29-15, 1:10 PM Hendricks Limes, PT [phone]

## 2015-01-09 NOTE — Progress Notes (Signed)
Physical Therapy Treatment Patient Details Name: Kashawn Manzano MRN: [id number] DOB: [birth year] Today's Date: 01/09/2015    History of Present Illness This patient is an 81 year old female who came to [facility] after a fall at her assisted living facility. She suffered a R subcapital femoral neck hip fracture (with mild impaction at fx site) and s/p R hip hemiarthroplasty repair (posterior) 01/08/15.     PT Comments    Pt with improvement standing up to RW with 2 assist but was not able to maintain PWB'ing status to attempt ambulation (and was not able to pivot on L LE with RW so pt assisted back to bed stand pivot with 2 assist to L side no AD).  Pt requires extra time and vc's for performing functional mobility and appears motivated to participate (but appears nervous about doing activities with her R LE d/t surgery).  Will continue to progress pt per pt tolerance.  Follow Up Recommendations  SNF     Equipment Recommendations  Rolling walker with 5" wheels    Recommendations for Other Services       Precautions / Restrictions Precautions Precautions: Fall;Posterior Hip Precaution Booklet Issued: Yes (comment) Restrictions Weight Bearing Restrictions: Yes RLE Weight Bearing: Partial weight bearing    Mobility  Bed Mobility Overal bed mobility: Needs Assistance;+2 for physical assistance Bed Mobility: Sit to Supine     Sit to supine: Mod assist;+2 for physical assistance;Max assist   General bed mobility comments: assist for trunk and B LE's  Transfers Overall transfer level: Needs assistance Equipment used: Rolling walker (2 wheeled) Transfers: Sit to/from [employer] Sit to Stand: Mod assist;+2 physical assistance (with RW) Stand pivot transfers: Mod assist;Max assist;+2 physical assistance (no AD recliner to bed)          Ambulation/Gait             General Gait Details: pt unable to maintain PWB'ing status to safely perform gait so  deferred   Stairs            Wheelchair Mobility    Modified Rankin (Stroke Patients Only)       Balance Overall balance assessment: Needs assistance Sitting-balance support: Bilateral upper extremity supported;Feet supported Sitting balance-Leahy Scale: Fair   Postural control: Posterior lean Standing balance support: Bilateral upper extremity supported Standing balance-Leahy Scale: Poor                      Cognition Arousal/Alertness: Awake/alert Behavior During Therapy: WFL for tasks assessed/performed Overall Cognitive Status: Within Functional Limits for tasks assessed       Memory: Decreased recall of precautions              Exercises      General Comments General comments (skin integrity, edema, etc.): dressing intact  Pt agreeable to PT session.      Pertinent Vitals/Pain Pain Assessment: 0-10 Pain Score: 7  Pain Location: R hip Pain Descriptors / Indicators: Aching;Sharp;Shooting (with activity; minimal at rest) Pain Intervention(s): Limited activity within patient's tolerance;Monitored during session;Repositioned  Vitals stable and WFL throughout treatment session on 2 L/min via nasal cannula.    Home Living Family/patient expects to be discharged to:: Skilled nursing facility     Type of Home: Assisted living [facility]) Home Access: Level entry     Home Equipment: Walker - 4 wheels;Shower seat      Prior Function Level of Independence: Needs assistance  Gait / Transfers Assistance Needed: Uses 4ww ADL's /  Homemaking Assistance Needed: Independent with dressing but requires assist for meals, bathing, laundry, cleaning     PT Goals (current goals can now be found in the care plan section) Acute Rehab PT Goals Patient Stated Goal: To go home PT Goal Formulation: With patient Time For Goal Achievement: 01/23/15 Potential to Achieve Goals: Fair Progress towards PT goals: Progressing toward goals    Frequency   BID    PT Plan Current plan remains appropriate    Co-evaluation             End of Session Equipment Utilized During Treatment: Gait belt;Oxygen Activity Tolerance: Patient limited by fatigue;Patient limited by pain Patient left: in bed;with call bell/phone within reach;with bed alarm set (hip abductor wedge applied; transport present with pt to take pt for chest x-ray)     Time: 1610-9604 PT Time Calculation (min) (ACUTE ONLY): 23 min  Charges: $Therapeutic Activity: 23-37 mins                    G CodesHendricks Limes 01/14/2015, 4:25 PM Hendricks Limes, PT [phone]

## 2015-01-09 NOTE — Progress Notes (Addendum)
[facility] [place] at [facility]   PATIENT NAME: [name]    MR#:  [id number]  DATE OF BIRTH:  1928-02-09  SUBJECTIVE:  CHIEF COMPLAINT:   Chief Complaint  Patient presents with  . Fall   Pain right hip if moved. Confused overnight MAXIMUM TEMPERATURE of 101.6 earlier today  REVIEW OF SYSTEMS:    Review of Systems  Constitutional: Positive for malaise/fatigue. Negative for fever and chills.  HENT: Negative for sore throat.   Eyes: Negative for blurred vision, double vision and pain.  Respiratory: Negative for cough, hemoptysis, shortness of breath and wheezing.   Cardiovascular: Negative for chest pain, palpitations, orthopnea and leg swelling.  Gastrointestinal: Negative for heartburn, nausea, vomiting, abdominal pain, diarrhea and constipation.  Genitourinary: Negative for dysuria and hematuria.  Musculoskeletal: Positive for back pain and joint pain.  Skin: Negative for rash.  Neurological: Positive for headaches. Negative for sensory change, speech change and focal weakness.  Endo/Heme/Allergies: Does not bruise/bleed easily.  Psychiatric/Behavioral: Negative for depression. The patient is not nervous/anxious.       DRUG ALLERGIES:  No Known Allergies  VITALS:  Blood pressure 151/55, pulse 100, temperature 101.6 F (38.7 C), temperature source Oral, resp. rate 16, height  (1.6 m), weight 63.095 kg (139 lb 1.6 oz), SpO2 99 %.  PHYSICAL EXAMINATION:   Physical Exam  GENERAL:  81 y.o.-year-old patient lying in the bed with no acute distress.  EYES: Pupils equal, round, reactive to light and accommodation. No scleral icterus. Extraocular muscles intact.  HEENT: Head atraumatic, normocephalic. Oropharynx and nasopharynx clear.  NECK:  Supple, no jugular venous distention. No thyroid enlargement, no tenderness.  LUNGS: Normal breath sounds bilaterally, no wheezing, rales, rhonchi. No use of accessory muscles of respiration.   CARDIOVASCULAR: S1, S2 normal. No murmurs, rubs, or gallops.  ABDOMEN: Soft, nontender, nondistended. Bowel sounds present. No organomegaly or mass.  EXTREMITIES: No cyanosis, clubbing or edema b/l.    NEUROLOGIC: Cranial nerves II through XII are intact. No focal Motor or sensory deficits b/l.   PSYCHIATRIC: The patient is alert and awake. Sitting in a chair SKIN: No obvious rash, lesion, or ulcer.  Right hip tender. Dressing in place  LABORATORY PANEL:   CBC  Recent Labs Lab 01/09/15 0608  WBC 11.7*  HGB 10.3*  HCT 30.8*  PLT 178   ------------------------------------------------------------------------------------------------------------------  Chemistries   Recent Labs Lab 01/09/15 0608  NA 136  K 3.8  CL 104  CO2 24  GLUCOSE 212*  BUN 11  CREATININE 0.88  CALCIUM 8.0*   ------------------------------------------------------------------------------------------------------------------  Cardiac Enzymes No results for input(s): TROPONINI in the last 168 hours. ------------------------------------------------------------------------------------------------------------------  RADIOLOGY:  Dg Chest 1 View  01/07/2015   CLINICAL DATA:  Patient status post fall. Right hip pain and possible fracture.  EXAM: CHEST  1 VIEW  COMPARISON:  None.  FINDINGS: Normal cardiac and mediastinal contours. No consolidative pulmonary opacities. No pleural effusion or pneumothorax.  IMPRESSION: No acute cardiopulmonary process.   Electronically Signed   By: Annia Belt M.D.   On: 01/07/2015 14:45   Dg Pelvis Portable  01/08/2015   CLINICAL DATA:  Repair hip fracture.  EXAM: PORTABLE PELVIS 1-2 VIEWS  COMPARISON:  01/07/2015  FINDINGS: Exam demonstrates placement of a right hip prosthesis which is intact and normally located. Skin staples are present over the lateral soft tissues of the right hip. There is minimal degenerative change of the left hip. Remainder of the exam is unchanged.   IMPRESSION:  Interval placement of right hip prosthesis intact and normally located.   Electronically Signed   By: Elberta Fortis M.D.   On: 01/08/2015 17:18   Dg Hip Port Unilat With Pelvis 1v Right  01/08/2015   CLINICAL DATA:  Status post total hip replacement.  EXAM: DG HIP (WITH OR WITHOUT PELVIS) 1V PORT RIGHT  COMPARISON:  01/07/2015  FINDINGS: There has been interval total right hip arthroplasty, with long stem femoral component. The previously demonstrated right femoral neck fracture is no longer seen. The hardware alignment is near anatomic. Expected soft tissue edema and emphysema, and skin staples are seen. Calcified fibroids are noted.  IMPRESSION: Status post right total hip arthroplasty, with prosthetic hardware in good alignment.   Electronically Signed   By: Ted Mcalpine M.D.   On: 01/08/2015 16:40   Dg Hip Unilat With Pelvis 2-3 Views Right  01/07/2015   CLINICAL DATA:  Pain following fall  EXAM: DG HIP (WITH OR WITHOUT PELVIS) 2-3V RIGHT  COMPARISON:  None.  FINDINGS: Frontal pelvis as well as frontal and lateral right hip images obtained. There is a subcapital femoral neck fracture on the right in near anatomic alignment. There is mild impaction in this area. No dislocation. No other fractures are apparent. There is moderate narrowing of both hip joints. Bones are osteoporotic.  There are calcified uterine leiomyomas in the right pelvis. Rectum is borderline distended with stool.  IMPRESSION: Subcapital femoral neck fracture on the right with mild impaction at the fracture site. Moderate narrowing both hip joints. Bones osteoporotic. No dislocation.   Electronically Signed   By: Bretta Bang III M.D.   On: 01/07/2015 14:43     ASSESSMENT AND PLAN:   81 year old female with past medical history of Alzheimer's dementia, diabetes, hypertension, hypothyroidism who presents to the hospital from assisted living after you're fall and noted to have a right hip fracture.  #  Fever Likely from atelectasis postop. Check chest x-ray Check urinalysis. On antibiotics. Incentive spirometer  # Right hip fracture POD - 1 DVT prophylaxis, physical therapy and discharge planning per orthopedics.  # Acute blood loss anemia status post surgery Monitor. No need for transfusion at this time.  # Acute inpatient delirium over dementia For precautions  # diabetes type 2 without complication-continue Levemir, sliding scale insulin.  # hypothyroidism-continue Synthroid.  # hypertension-continue lisinopril.  # hyperlipidemia-continue simvastatin.  All the records are reviewed and case discussed with Care Management/Social Workerr. Management plans discussed with the patient, family and they are in agreement.  CODE STATUS: FULL  DVT Prophylaxis: SCDs  TOTAL TIME TAKING CARE OF THIS PATIENT: 35 minutes.   POSSIBLE D/C IN 1-2 DAYS, DEPENDING ON CLINICAL CONDITION.   Milagros Loll R M.D on 01/09/2015 at 12:14 PM  Between 7am to 6pm - Pager - [phone]  After 6pm go to [email or web address] - password [email or web address] [facility]  [facility]  Office  [phone]  CC: Primary care physician; CAMPBELL, Kristen Loader, FNP

## 2015-01-09 NOTE — Progress Notes (Signed)
Bladder scanned pt. And amount is 506cc. Foley reinserted per Dr. Cherlynn Kaiser.

## 2015-01-09 NOTE — Progress Notes (Signed)
Alert but confused. Pain controlled with PO pain meds. Pt. Dangled on side of bed last night. Foley patent. Pills whole with water.  Neurochecks WDL. Resting quietly.

## 2015-01-09 NOTE — Progress Notes (Signed)
Subjective:  Patient is postop day 1 status post right hip hemiarthroplasty. Patient reports pain as mild.  Patient is up out of bed to a chair. She has no complaints currently.  Objective:   VITALS:   Filed Vitals:   01/08/15 2027 01/08/15 2203 01/09/15 0342 01/09/15 0747  BP: 148/62 132/93 153/71 151/55  Pulse: 82 89 100 100  Temp: 97.8 F (36.6 C) 98.1 F (36.7 C) 100.8 F (38.2 C) 101.6 F (38.7 C)  TempSrc: Oral Oral Oral Oral  Resp: 17 17 16 16   Height:      Weight:      SpO2: 98% 97% 96% 99%    PHYSICAL EXAM:  Examination the right lower extremity demonstrates the patient is: Neurovascular intact Sensation intact distally Intact pulses distally Dorsiflexion/Plantar flexion intact Incision: dressing C/D/I No cellulitis present Compartment soft  LABS  Results for orders placed or performed during the hospital encounter of 01/07/15 (from the past 24 hour(s))  Glucose, capillary     Status: Abnormal   Collection Time: 01/08/15  3:41 PM  Result Value Ref Range   Glucose-Capillary 159 (H) 65 - 99 mg/dL  Glucose, capillary     Status: Abnormal   Collection Time: 01/08/15  9:00 PM  Result Value Ref Range   Glucose-Capillary 313 (H) 65 - 99 mg/dL  CBC     Status: Abnormal   Collection Time: 01/09/15  6:08 AM  Result Value Ref Range   WBC 11.7 (H) 3.6 - 11.0 K/uL   RBC 3.58 (L) 3.80 - 5.20 MIL/uL   Hemoglobin 10.3 (L) 12.0 - 16.0 g/dL   HCT 91.4 (L) 78.2 - 95.6 %   MCV 85.9 80.0 - 100.0 fL   MCH 28.9 26.0 - 34.0 pg   MCHC 33.6 32.0 - 36.0 g/dL   RDW 21.3 08.6 - 57.8 %   Platelets 178 150 - 440 K/uL  Basic metabolic panel     Status: Abnormal   Collection Time: 01/09/15  6:08 AM  Result Value Ref Range   Sodium 136 135 - 145 mmol/L   Potassium 3.8 3.5 - 5.1 mmol/L   Chloride 104 101 - 111 mmol/L   CO2 24 22 - 32 mmol/L   Glucose, Bld 212 (H) 65 - 99 mg/dL   BUN 11 6 - 20 mg/dL   Creatinine, Ser 4.69 0.44 - 1.00 mg/dL   Calcium 8.0 (L) 8.9 - 10.3 mg/dL    GFR calc non Af Amer 58 (L) >60 mL/min   GFR calc Af Amer >60 >60 mL/min   Anion gap 8 5 - 15    Dg Chest 1 View  01/07/2015   CLINICAL DATA:  Patient status post fall. Right hip pain and possible fracture.  EXAM: CHEST  1 VIEW  COMPARISON:  None.  FINDINGS: Normal cardiac and mediastinal contours. No consolidative pulmonary opacities. No pleural effusion or pneumothorax.  IMPRESSION: No acute cardiopulmonary process.   Electronically Signed   By: Annia Belt M.D.   On: 01/07/2015 14:45   Dg Pelvis Portable  01/08/2015   CLINICAL DATA:  Repair hip fracture.  EXAM: PORTABLE PELVIS 1-2 VIEWS  COMPARISON:  01/07/2015  FINDINGS: Exam demonstrates placement of a right hip prosthesis which is intact and normally located. Skin staples are present over the lateral soft tissues of the right hip. There is minimal degenerative change of the left hip. Remainder of the exam is unchanged.  IMPRESSION: Interval placement of right hip prosthesis intact and normally located.   Electronically  Signed   By: Elberta Fortis M.D.   On: 01/08/2015 17:18   Dg Hip Port Unilat With Pelvis 1v Right  01/08/2015   CLINICAL DATA:  Status post total hip replacement.  EXAM: DG HIP (WITH OR WITHOUT PELVIS) 1V PORT RIGHT  COMPARISON:  01/07/2015  FINDINGS: There has been interval total right hip arthroplasty, with long stem femoral component. The previously demonstrated right femoral neck fracture is no longer seen. The hardware alignment is near anatomic. Expected soft tissue edema and emphysema, and skin staples are seen. Calcified fibroids are noted.  IMPRESSION: Status post right total hip arthroplasty, with prosthetic hardware in good alignment.   Electronically Signed   By: Ted Mcalpine M.D.   On: 01/08/2015 16:40   Dg Hip Unilat With Pelvis 2-3 Views Right  01/07/2015   CLINICAL DATA:  Pain following fall  EXAM: DG HIP (WITH OR WITHOUT PELVIS) 2-3V RIGHT  COMPARISON:  None.  FINDINGS: Frontal pelvis as well as frontal  and lateral right hip images obtained. There is a subcapital femoral neck fracture on the right in near anatomic alignment. There is mild impaction in this area. No dislocation. No other fractures are apparent. There is moderate narrowing of both hip joints. Bones are osteoporotic.  There are calcified uterine leiomyomas in the right pelvis. Rectum is borderline distended with stool.  IMPRESSION: Subcapital femoral neck fracture on the right with mild impaction at the fracture site. Moderate narrowing both hip joints. Bones osteoporotic. No dislocation.   Electronically Signed   By: Bretta Bang III M.D.   On: 01/07/2015 14:43    Assessment/Plan: 1 Day Post-Op   Active Problems:   Closed right hip fracture  Patient is doing well postop. She is already up out of bed to a chair. She denies any significant right hip pain. She will begin Lovenox for DVT prophylaxis today. Foley catheter has been removed. She will complete 24 hours postop antibiotics. Patient should be encouraged to use incentive spirometry each hour while awake. Continue TED stockings and foot pumps. Continue physical and occupational therapy. Recheck labs in the morning.    [name] , MD 01/09/2015, 11:45 AM

## 2015-01-10 ENCOUNTER — Encounter: Payer: Self-pay | Admitting: Internal Medicine

## 2015-01-10 DIAGNOSIS — J9811 Atelectasis: Secondary | ICD-10-CM | POA: Diagnosis not present

## 2015-01-10 DIAGNOSIS — G9340 Encephalopathy, unspecified: Secondary | ICD-10-CM | POA: Diagnosis not present

## 2015-01-10 DIAGNOSIS — F0390 Unspecified dementia without behavioral disturbance: Secondary | ICD-10-CM | POA: Diagnosis present

## 2015-01-10 LAB — BASIC METABOLIC PANEL
Anion gap: 7 (ref 5–15)
BUN: 15 mg/dL (ref 6–20)
CALCIUM: 7.8 mg/dL — AB (ref 8.9–10.3)
CHLORIDE: 105 mmol/L (ref 101–111)
CO2: 26 mmol/L (ref 22–32)
CREATININE: 0.8 mg/dL (ref 0.44–1.00)
GFR calc Af Amer: >60 mL/min (ref >60)
GFR calc non Af Amer: >60 mL/min (ref >60)
GLUCOSE: 186 mg/dL — AB (ref 65–99)
Potassium: 3.7 mmol/L (ref 3.5–5.1)
Sodium: 138 mmol/L (ref 135–145)

## 2015-01-10 LAB — CBC
HEMATOCRIT: 26.7 % — AB (ref 35.0–47.0)
Hemoglobin: 9.1 g/dL — ABNORMAL LOW (ref 12.0–16.0)
MCH: 29.4 pg (ref 26.0–34.0)
MCHC: 34.1 g/dL (ref 32.0–36.0)
MCV: 86 fL (ref 80.0–100.0)
Platelets: 147 10*3/uL — ABNORMAL LOW (ref 150–440)
RBC: 3.11 MIL/uL — ABNORMAL LOW (ref 3.80–5.20)
RDW: 13.1 % (ref 11.5–14.5)
WBC: 10.5 10*3/uL (ref 3.6–11.0)

## 2015-01-10 LAB — SURGICAL PATHOLOGY

## 2015-01-10 LAB — GLUCOSE, CAPILLARY
GLUCOSE-CAPILLARY: 243 mg/dL — AB (ref 65–99)
GLUCOSE-CAPILLARY: 205 mg/dL — AB (ref 65–99)

## 2015-01-10 MED ORDER — ENOXAPARIN SODIUM 30 MG/0.3ML ~~LOC~~ SOLN
30.0000 mg | Freq: Two times a day (BID) | SUBCUTANEOUS | Status: DC
Start: 2015-01-10 — End: 2015-04-14

## 2015-01-10 MED ORDER — OXYCODONE HCL 5 MG PO TABS: 5.0000 mg | ORAL_TABLET | ORAL | Status: DC | PRN

## 2015-01-10 MED ORDER — INSULIN ASPART 100 UNIT/ML ~~LOC~~ SOLN
0.0000 [IU] | Freq: Every day | SUBCUTANEOUS | Status: DC
  Administered 2015-01-10: 2 [IU] via SUBCUTANEOUS
  Filled 2015-01-10: qty 2

## 2015-01-10 MED ORDER — FLEET ENEMA 7-19 GM/118ML RE ENEM
1.0000 | ENEMA | Freq: Once | RECTAL | Status: AC
  Administered 2015-01-10: 1 via RECTAL

## 2015-01-10 MED ORDER — FERROUS SULFATE 325 (65 FE) MG PO TABS: 325.0000 mg | ORAL_TABLET | Freq: Two times a day (BID) | ORAL | Status: DC

## 2015-01-10 MED ORDER — ENOXAPARIN SODIUM 40 MG/0.4ML ~~LOC~~ SOLN: 40.0000 mg | SUBCUTANEOUS | Status: DC

## 2015-01-10 MED ORDER — POLYETHYLENE GLYCOL 3350 17 G PO PACK: 17.0000 g | PACK | Freq: Every day | ORAL | Status: DC | PRN

## 2015-01-10 MED ORDER — INSULIN DETEMIR 100 UNIT/ML ~~LOC~~ SOLN
25.0000 [IU] | Freq: Every day | SUBCUTANEOUS | Status: DC
  Administered 2015-01-10: 25 [IU] via SUBCUTANEOUS
  Filled 2015-01-10 (×2): qty 0.25

## 2015-01-10 MED ORDER — DOCUSATE SODIUM 100 MG PO CAPS
100.0000 mg | ORAL_CAPSULE | Freq: Two times a day (BID) | ORAL | Status: DC
Start: 2015-01-10 — End: 2015-04-14

## 2015-01-10 MED ORDER — MAGNESIUM CITRATE PO SOLN
1.0000 | Freq: Once | ORAL | Status: AC
  Administered 2015-01-10: 1 via ORAL
  Filled 2015-01-10: qty 296

## 2015-01-10 MED ORDER — INSULIN ASPART 100 UNIT/ML ~~LOC~~ SOLN
0.0000 [IU] | Freq: Three times a day (TID) | SUBCUTANEOUS | Status: DC
  Administered 2015-01-10: 5 [IU] via SUBCUTANEOUS
  Administered 2015-01-11: 11 [IU] via SUBCUTANEOUS
  Administered 2015-01-11: 8 [IU] via SUBCUTANEOUS
  Filled 2015-01-10: qty 11
  Filled 2015-01-10: qty 5
  Filled 2015-01-10: qty 8

## 2015-01-10 MED ORDER — INSULIN DETEMIR 100 UNIT/ML ~~LOC~~ SOLN: 25.0000 [IU] | Freq: Every day | SUBCUTANEOUS | Status: DC

## 2015-01-10 NOTE — Progress Notes (Signed)
Pt. Alert but pleasantly confused. POD 2. Neurochecks WDL. Spiked temp last evening of 102.2. Came down to 99.9 after tylenol was administered. Pain controlled with PO pain meds. Pt. Was unable to void and foley was reinserted. Draining cloudy yellow urine. Dressing clean dry and intact. Pills whole with water. Resting quietly.

## 2015-01-10 NOTE — Progress Notes (Signed)
Occupational Therapy Treatment Patient Details Name: [name] MRN: [id number] DOB: 1928-01-29 Today's Date: 01/10/2015    History of present illness This patient is an 81 year old female who came to [facility] after a fall at her assisted living facility. She suffered a R subcapital femoral neck hip fracture (with mild impaction at fx site) and s/p R hip hemiarthroplasty repair (posterior) 01/08/15.    OT comments  Patient very distracted by anxiousness. Suggested talking to chaplin. Did not agree at this time.  Follow Up Recommendations  SNF    Equipment Recommendations   (Needs hip kit)    Recommendations for Other Services      Precautions / Restrictions Precautions Precautions: Fall;Posterior Hip Precaution Booklet Issued: Yes (comment) (Per Physical Therapy) Restrictions Weight Bearing Restrictions: Yes RLE Weight Bearing: Partial weight bearing       Mobility Bed Mobility  Transfers            Balance                          ADL  Patient unable to name hip precautions, practiced lower body dressing but still needs hand over hand assist to practice techniques with verbal cues.                                              Vision                     Perception     Praxis      Cognition   Behavior During Therapy: [facility] for tasks assessed/performed Overall Cognitive Status: Within Functional Limits for tasks assessed       Memory: Decreased recall of precautions (unable to name any hip pricautions)               Extremity/Trunk Assessment               Exercises     Shoulder Instructions       General Comments      Pertinent Vitals/ Pain       Patient reports no pain but grimaces.   Home Living                                          Prior Functioning/Environment              Frequency       Progress Toward Goals  OT Goals(current goals can now be found in the  care plan section)     Acute Rehab OT Goals Patient Stated Goal: Wants to go home  Plan      Co-evaluation                 End of Session Equipment Utilized During Treatment:  (hip kit)   Activity Tolerance     Patient Left in chair;with call bell/phone within reach;with chair alarm set   Nurse Communication          Time: 1100-1117 OT Time Calculation (min): 17 min  Charges: OT General Charges $OT Visit: 1 Procedure OT Treatments $Self Care/Home Management : 8-22 mins  Myrene Galas, MS/OTR/L  01/10/2015, 11:26 AM

## 2015-01-10 NOTE — Discharge Instructions (Addendum)
°  DIET:  Diabetic diet  DISCHARGE CONDITION:  Stable  ACTIVITY:  Activity as tolerated per orthopedics  OXYGEN:  Home Oxygen: No.   Oxygen Delivery: room air  DISCHARGE LOCATION:  nursing home   If you experience worsening of your admission symptoms, develop shortness of breath, life threatening emergency, suicidal or homicidal thoughts you must seek medical attention immediately by calling 911 or calling your MD immediately  if symptoms less severe.  You Must read complete instructions/literature along with all the possible adverse reactions/side effects for all the Medicines you take and that have been prescribed to you. Take any new Medicines after you have completely understood and accpet all the possible adverse reactions/side effects.   Please note  You were cared for by a hospitalist during your hospital stay. If you have any questions about your discharge medications or the care you received while you were in the hospital after you are discharged, you can call the unit and asked to speak with the hospitalist on call if the hospitalist that took care of you is not available. Once you are discharged, your primary care physician will handle any further medical issues. Please note that NO REFILLS for any discharge medications will be authorized once you are discharged, as it is imperative that you return to your primary care physician (or establish a relationship with a primary care physician if you do not have one) for your aftercare needs so that they can reassess your need for medications and monitor your lab values.   Orthopedic discharge instructions: Patient has had a right hip hemiarthroplasty. She should remain partial weightbearing on the right lower extremity 4 weeks. She needs to observe posterior hip precautions. Patient should continue to elevate the right lower extremity and may apply ice to the surgical site prn.  She should continue using incentive spirometry every  hour while awake. Patient should also continue TED stockings until her orthopedic follow-up. Patient will remain on Lovenox 30 mg subcutaneous twice a day for 4 weeks for DVT prophylaxis followed by 2 weeks of enteric-coated aspirin 325 mg by mouth daily.  Patient should continue physical and occupational therapy at her SNF to work on hip range of motion, lower extremity strengthening and gait training. She should use a walker for assistance with ambulation at all times.  Patient will follow up with Dr. [name] at [facility], [facility] office in 10-14 days for wound check, staple removal and x-ray.

## 2015-01-10 NOTE — Discharge Summary (Addendum)
[facility] - [place] [facility]   PATIENT NAME: [name]    MR#:  [id number]  DATE OF BIRTH:  [birth year]  DATE OF ADMISSION:  01/07/2015 ADMITTING PHYSICIAN: [name], MD  DATE OF DISCHARGE: 01/11/2015  PRIMARY CARE PHYSICIAN: CAMPBELL, MARGARET P, FNP    ADMISSION DIAGNOSIS:  Femoral neck fracture, right, closed, initial encounter [S72.001A]  DISCHARGE DIAGNOSIS:  Active Problems:   Closed right hip fracture   Atelectasis   Acute encephalopathy   Dementia   SECONDARY DIAGNOSIS:   Past Medical History  Diagnosis Date  . Alzheimer's dementia   . Diabetes type 2, controlled   . Hypothyroidism   . Vision impairment   . Hearing impairment   . Hypertension   . Unsteady gait      ADMITTING HISTORY  [name] is a 81 y.o. female with a known history of Alzheimer's dementia, diabetes type 2, hypothyroidism, hypertension, who presents to the hospital after a mechanical fall at the assisted living and noted to have a right hip fracture. Patient denies any prodromal symptoms prior to her fall-like chest pain, dizziness, palpitations, nausea, vomiting or any other associated symptoms. Patient normally uses a walker to get around and was not using her walker this morning when she fell. She presented to the ER as she was having significant right hip pain and was noted to have a right hip fracture. Hospitalist services were contacted for further treatment and evaluation.   HOSPITAL COURSE:   81 year old female with past medical history of Alzheimer's dementia, diabetes, hypertension, hypothyroidism who presents to the hospital from assisted living after you're fall and noted to have a right hip fracture.  # Fever Likely from atelectasis postop. Checked chest x-ray and no PNA. UA showed no UTI. Antibiotics stopped. Incentive spirometer  # Right hip fracture - s/p Surgery DVT prophylaxis, physical therapy  # Acute blood loss  anemia status post surgery Monitor. No need for transfusion at this time.  # Acute inpatient delirium over dementia Fall precautions  # diabetes type 2 without complication-continue Levemir, sliding scale insulin. Increased Insulin to 25 units daily at bedtime.  Resumed her metformin at home dose.  # Urinary retention likely from immobility and pain medication. Foley catheter to be discontinued on 01/11/2015 for voiding trial. If still has retention this will be replaced and a voiding trial needs to be done at the skilled nursing facility.  # hypothyroidism-continue Synthroid.  # hypertension-continue lisinopril.  # hyperlipidemia-continue simvastatin.  Discharge to SNF  CONSULTS OBTAINED:  Treatment Team:  [name], MD  DRUG ALLERGIES:  No Known Allergies  DISCHARGE MEDICATIONS:   Current Discharge Medication List    START taking these medications   Details  docusate sodium (COLACE) 100 MG capsule Take 1 capsule (100 mg total) by mouth 2 (two) times daily. Qty: 10 capsule, Refills: 0    enoxaparin (LOVENOX) 30 MG/0.3ML injection Inject 0.3 mLs (30 mg total) into the skin twice daily for 4 weeks. Qty: 56 Syringe, Refills: 0    ferrous sulfate 325 (65 FE) MG tablet Take 1 tablet (325 mg total) by mouth 2 (two) times daily with a meal. Qty: 60 tablet, Refills: 0    oxyCODONE (OXY IR/ROXICODONE) 5 MG immediate release tablet Take 1 tablet (5 mg total) by mouth every 4 (four) hours as needed for breakthrough pain ((for MODERATE breakthrough pain)). Qty: 30 tablet, Refills: 0    polyethylene glycol (MIRALAX / GLYCOLAX) packet Take 17 g by mouth daily  as needed for mild constipation. Qty: 14 each, Refills: 0      CONTINUE these medications which have CHANGED   Details  insulin detemir (LEVEMIR) 100 UNIT/ML injection Inject 0.25 mLs (25 Units total) into the skin at bedtime. Qty: 10 mL, Refills: 0      CONTINUE these medications which have NOT CHANGED   Details   aspirin EC 81 MG tablet Take 81 mg by mouth daily.    levothyroxine (SYNTHROID, LEVOTHROID) 75 MCG tablet Take 75 mcg by mouth daily.    lisinopril (PRINIVIL,ZESTRIL) 5 MG tablet Take 5 mg by mouth daily.    metFORMIN (GLUCOPHAGE-XR) 500 MG 24 hr tablet Take 1,000 mg by mouth daily.    simvastatin (ZOCOR) 20 MG tablet Take 20 mg by mouth every evening.    traZODone (DESYREL) 50 MG tablet Take 50 mg by mouth at bedtime.    vitamin B-12 (CYANOCOBALAMIN) 1000 MCG tablet Take 2,000 mcg by mouth daily.         Today    VITAL SIGNS:  Blood pressure 146/56, pulse 94, temperature 98.6 F (37 C), temperature source Oral, resp. rate 18, height 5\' 3"  (1.6 m), weight 63.095 kg (139 lb 1.6 oz), SpO2 98 %.  I/O:   Intake/Output Summary (Last 24 hours) at 01/10/15 1051 Last data filed at 01/10/15 0356  Gross per 24 hour  Intake      0 ml  Output   1200 ml  Net  -1200 ml    PHYSICAL EXAMINATION:  Physical Exam  GENERAL:  81 y.o.-year-old patient lying in the bed with no acute distress.  LUNGS: Normal breath sounds bilaterally, no wheezing, rales,rhonchi or crepitation. No use of accessory muscles of respiration.  CARDIOVASCULAR: S1, S2 normal. No murmurs, rubs, or gallops.  ABDOMEN: Soft, non-tender, non-distended. Bowel sounds present. No organomegaly or mass.  NEUROLOGIC: Moves all 4 extremities. PSYCHIATRIC: The patient is alert and awake SKIN: No obvious rash, lesion, or ulcer.  Right hip tender. Dressing in place  DATA REVIEW:   CBC  Recent Labs Lab 01/10/15 0553  WBC 10.5  HGB 9.1*  HCT 26.7*  PLT 147*    Chemistries   Recent Labs Lab 01/10/15 0553  NA 138  K 3.7  CL 105  CO2 26  GLUCOSE 186*  BUN 15  CREATININE 0.80  CALCIUM 7.8*    Cardiac Enzymes No results for input(s): TROPONINI in the last 168 hours.  Microbiology Results  Results for orders placed or performed during the hospital encounter of 01/07/15  Urine culture     Status: None    Collection Time: 01/07/15  3:55 PM  Result Value Ref Range Status   Specimen Description URINE, RANDOM  Final   Special Requests NONE  Final   Culture NO GROWTH 1 DAY  Final   Report Status 01/09/2015 FINAL  Final  MRSA PCR Screening     Status: None   Collection Time: 01/07/15 11:20 PM  Result Value Ref Range Status   MRSA by PCR NEGATIVE NEGATIVE Final    Comment:        The GeneXpert MRSA Assay (FDA approved for NASAL specimens only), is one component of a comprehensive MRSA colonization surveillance program. It is not intended to diagnose MRSA infection nor to guide or monitor treatment for MRSA infections.   Urine culture     Status: None (Preliminary result)   Collection Time: 01/09/15  1:22 PM  Result Value Ref Range Status   Specimen Description URINE, CLEAN  CATCH  Final   Special Requests NONE  Final   Culture NO GROWTH < 12 HOURS  Final   Report Status PENDING  Incomplete    RADIOLOGY:  Dg Chest 2 View  01/09/2015   CLINICAL DATA:  Fever.  Hip surgery 1 day prior.  EXAM: CHEST  2 VIEW  COMPARISON:  01/07/2015 chest radiograph  FINDINGS: Stable cardiomediastinal silhouette with top-normal heart size. No pneumothorax. No pleural effusion. Curvilinear opacities are seen at the left greater than right lung bases. No overt pulmonary edema. Moderate degenerative changes are seen in the thoracic spine.  IMPRESSION: Curvilinear opacities at the left greater than right lung bases, favor atelectasis.   Electronically Signed   By: Delbert Phenix M.D.   On: 01/09/2015 14:45   Dg Pelvis Portable  01/08/2015   CLINICAL DATA:  Repair hip fracture.  EXAM: PORTABLE PELVIS 1-2 VIEWS  COMPARISON:  01/07/2015  FINDINGS: Exam demonstrates placement of a right hip prosthesis which is intact and normally located. Skin staples are present over the lateral soft tissues of the right hip. There is minimal degenerative change of the left hip. Remainder of the exam is unchanged.  IMPRESSION: Interval  placement of right hip prosthesis intact and normally located.   Electronically Signed   By: Elberta Fortis M.D.   On: 01/08/2015 17:18   Dg Hip Port Unilat With Pelvis 1v Right  01/08/2015   CLINICAL DATA:  Status post total hip replacement.  EXAM: DG HIP (WITH OR WITHOUT PELVIS) 1V PORT RIGHT  COMPARISON:  01/07/2015  FINDINGS: There has been interval total right hip arthroplasty, with long stem femoral component. The previously demonstrated right femoral neck fracture is no longer seen. The hardware alignment is near anatomic. Expected soft tissue edema and emphysema, and skin staples are seen. Calcified fibroids are noted.  IMPRESSION: Status post right total hip arthroplasty, with prosthetic hardware in good alignment.   Electronically Signed   By: Ted Mcalpine M.D.   On: 01/08/2015 16:40      Follow up with PCP in 1 week.  Management plans discussed with the patient, family and they are in agreement.  CODE STATUS:     Code Status Orders        Start     Ordered   01/08/15 1701  Full code   Continuous     01/08/15 1700      TOTAL TIME TAKING CARE OF THIS PATIENT ON DAY OF DISCHARGE: more than 30 minutes.    Georgeanna Harrison, M.D on 01/11/2015  at 10:51 AM  Between 7am to 6pm - Pager - [phone]  After 6pm go to [email or web address] - password [email or web address] [facility]  [facility]  Office  [phone]  CC: Primary care physician; Clemetine Marker, FNP     Orthopedic discharge instructions: Patient has had a right hip hemiarthroplasty. She should remain partial weightbearing on the right lower extremity 4 weeks. She needs to observe posterior hip precautions. Patient should continue to elevate the right lower extremity and may apply ice to the surgical site prn.  She should continue using incentive spirometry every hour while awake. Patient should also continue TED stockings until her orthopedic follow-up. Patient will remain on Lovenox 30 mg subcutaneous twice  a day for 4 weeks for DVT prophylaxis followed by 2 weeks of enteric-coated aspirin 325 mg by mouth daily.  Patient should continue physical and occupational therapy at the SNF to work on hip range of motion, lower extremity strengthening and gait  training. She should use a walker for assistance with ambulation at all times.  Patient will follow up with Dr. [name] at [facility], [facility] office in 10-14 days for wound check, staple removal and x-ray.  [name], MD [facility], [place]

## 2015-01-10 NOTE — Progress Notes (Signed)
Inpatient Diabetes Program Recommendations  AACE/ADA: New Consensus Statement on Inpatient Glycemic Control (2013)  Target Ranges:  Prepandial:   less than 140 mg/dL      Peak postprandial:   less than 180 mg/dL (1-2 hours)      Critically ill patients:  140 - 180 mg/dL   Review of Glycemic Control:  Results for [name], [name] (MRN [id number]) as of 01/10/2015 13:26  Ref. Range 01/08/2015 21:00 01/09/2015 07:49 01/09/2015 20:59  Glucose-Capillary Latest Ref Range: 65-99 mg/dL 409 (H) 811 (H) 914 (H)    CBG's greater than goal. Note Lantus increased to 25 units daily.  Called and discussed with Dr. Elpidio Anis.  Orders received to also add Novolog moderate correction tid with meals and HS.  Thanks, Beryl Meager, RN, BC-ADM Inpatient Diabetes Coordinator Pager [phone] (8a-5p)

## 2015-01-10 NOTE — Progress Notes (Addendum)
Plan is for patient to D/C to [facility] Saturday 01/11/15. Per [facility] admissions coordinator at [facility] patient is going to room 503. Clinical Child psychotherapist (CSW) sent D/C Summary, prescriptions and Ortho Progress note to [place] today via carefinder. Patient is aware of above. CSW contacted patient's daughter [name] and made her aware of above. Per daughter she has spoken with G[facility] Romney and completed admissions paper work. Per [facility] Coordinator at [facility] they will pack patient some clothes and bring them to [employer]. CSW will continue to follow and assist as needed.   Jetta Lout, LCSWA [phone]

## 2015-01-10 NOTE — Progress Notes (Signed)
Physical Therapy Treatment Patient Details Name: [name] MRN: [id number] DOB: 1927-11-04 Today's Date: 01/10/2015    History of Present Illness This patient is an 81 year old female who came to [facility] after a fall at her assisted living facility. She suffered a R subcapital femoral neck hip fracture (with mild impaction at fx site) and s/p R hip hemiarthroplasty repair (posterior) 01/08/15.     PT Comments    Pt appearing more anxious this afternoon and mildly agitated (pt just wanted to go back to bed and sleep--nursing notified).  Therapy progress limited d/t c/o R hip pain with any movement and pt appearing anxious that she will hurt it with any activity.  Pt continues to be pleasant though with therapy.  Anticipate pt discharge to [facility] tomorrow.  Follow Up Recommendations  SNF     Equipment Recommendations  Rolling walker with 5" wheels    Recommendations for Other Services       Precautions / Restrictions Precautions Precautions: Fall;Posterior Hip Restrictions Weight Bearing Restrictions: Yes RLE Weight Bearing: Partial weight bearing    Mobility  Bed Mobility Overal bed mobility: Needs Assistance;+2 for physical assistance Bed Mobility: Sit to Supine       Sit to supine: Mod assist;Max assist;+2 for physical assistance   General bed mobility comments: assist for trunk and B LE's  Transfers Overall transfer level: Needs assistance   Transfers: Stand Pivot Transfers   Stand pivot transfers: Max assist       General transfer comment: to L side  Ambulation/Gait             General Gait Details: pt unable to maintain PWB'ing status to safely perform gait so deferred   Stairs            Wheelchair Mobility    Modified Rankin (Stroke Patients Only)       Balance                                    Cognition Arousal/Alertness: Awake/alert Behavior During Therapy: Agitated;Anxious (appeared mildly agitated and wanted to  go back to bed and be left alone to sleep) Overall Cognitive Status: No family/caregiver present to determine baseline cognitive functioning (pleasant but confused)       Memory: Decreased recall of precautions (unable to recall any hip precautions)              Exercises      General Comments        Pertinent Vitals/Pain Pain Assessment: Faces Faces Pain Scale: Hurts whole lot (with movement hurts alot; minimal at rest) Pain Location: R hip Pain Descriptors / Indicators: Aching;Shooting;Sharp (with movement) Pain Intervention(s): Limited activity within patient's tolerance;Monitored during session;Repositioned    Home Living                      Prior Function            PT Goals (current goals can now be found in the care plan section) Acute Rehab PT Goals Patient Stated Goal: Wants to go home PT Goal Formulation: With patient Time For Goal Achievement: 01/23/15 Potential to Achieve Goals: Fair Progress towards PT goals: Progressing toward goals    Frequency  BID    PT Plan Current plan remains appropriate    Co-evaluation             End of Session Equipment Utilized During  Treatment: Gait belt;Oxygen Activity Tolerance: Patient limited by fatigue;Patient limited by pain Patient left: in bed;with call bell/phone within reach;with bed alarm set;with nursing/sitter in room;with SCD's reapplied (hip abduction pillow placed)     Time: 1435-1500 PT Time Calculation (min) (ACUTE ONLY): 25 min  Charges:  $Therapeutic Activity: 23-37 mins                    G CodesHendricks Limes 02-07-15, 4:49 PM Hendricks Limes, PT [phone]

## 2015-01-10 NOTE — Progress Notes (Signed)
Physical Therapy Treatment Patient Details Name: [name] MRN: [id number] DOB: 02-28-1928 Today's Date: 01/10/2015    History of Present Illness This patient is an 81 year old female who came to [facility] after a fall at her assisted living facility. She suffered a R subcapital femoral neck hip fracture (with mild impaction at fx site) and s/p R hip hemiarthroplasty repair (posterior) 01/08/15.     PT Comments    Pt appearing more anxious today regarding hurting her surgery.  Pt still very pleasant though and willing to perform therapy activities.  Unable to progress pt to ambulation d/t pt unable to maintain PWB'ing status safely.  Will continue to progress pt per pt tolerance.  Follow Up Recommendations  SNF     Equipment Recommendations  Rolling walker with 5" wheels    Recommendations for Other Services       Precautions / Restrictions Precautions Precautions: Fall;Posterior Hip Restrictions Weight Bearing Restrictions: Yes RLE Weight Bearing: Partial weight bearing    Mobility  Bed Mobility Overal bed mobility: Needs Assistance;+2 for physical assistance Bed Mobility: Supine to Sit     Supine to sit: Max assist;+2 for physical assistance;HOB elevated     General bed mobility comments: assist for trunk and B LE's  Transfers Overall transfer level: Needs assistance Equipment used: Rolling walker (2 wheeled) Transfers: Sit to/from Stand Sit to Stand: Mod assist;+2 physical assistance (x2 trials with RW) Stand pivot transfers: Mod assist;Max assist;+2 physical assistance (no AD bed to recliner)          Ambulation/Gait             General Gait Details: pt unable to maintain PWB'ing status to safely perform gait so deferred   Stairs            Wheelchair Mobility    Modified Rankin (Stroke Patients Only)       Balance Overall balance assessment: Needs assistance Sitting-balance support: Bilateral upper extremity supported;Feet  supported Sitting balance-Leahy Scale: Fair     Standing balance support: Bilateral upper extremity supported (on RW) Standing balance-Leahy Scale: Poor                      Cognition Arousal/Alertness: Awake/alert Behavior During Therapy: WFL for tasks assessed/performed Overall Cognitive Status: Within Functional Limits for tasks assessed       Memory: Decreased recall of precautions              Exercises   Performed semi-supine B LE therapeutic exercise x 10 reps:  Ankle pumps (AROM B LE's); quad sets x3 second holds (AROM B LE's); glute squeezes x3 second holds (AROM B); hip aDduction isometrics (pillow between pt's knees) x3 second holds; SAQ's (AAROM R; AROM L); heelslides (AAROM R; AROM L), hip abd/adduction (AAROM R; AROM L).  Pt required vc's and tactile cues for correct technique with exercises.     General Comments General comments (skin integrity, edema, etc.): dressing intact  Nursing cleared pt for participation in physical therapy.  Pt agreeable to PT session.      Pertinent Vitals/Pain Pain Assessment: 0-10 Pain Score: 7  (minimal at rest; 7/10 with activity) Pain Location: R hip Pain Descriptors / Indicators: Aching;Sharp;Shooting (with activity) Pain Intervention(s): Limited activity within patient's tolerance;Monitored during session;Premedicated before session;Repositioned  Vitals stable and WFL throughout treatment session on 2 L/min via nasal cannula.    Home Living  Prior Function            PT Goals (current goals can now be found in the care plan section) Acute Rehab PT Goals Patient Stated Goal: To go home PT Goal Formulation: With patient Time For Goal Achievement: 01/23/15 Potential to Achieve Goals: Fair Progress towards PT goals: Progressing toward goals    Frequency  BID    PT Plan Current plan remains appropriate    Co-evaluation             End of Session Equipment Utilized During  Treatment: Gait belt;Oxygen Activity Tolerance: Patient limited by fatigue;Patient limited by pain Patient left: in chair;with call bell/phone within reach;with chair alarm set;with SCD's reapplied (pillows placed between pt's knees and to elevate heels)     Time: 1610-9604 PT Time Calculation (min) (ACUTE ONLY): 35 min  Charges:  $Therapeutic Exercise: 8-22 mins $Therapeutic Activity: 8-22 mins                    G CodesHendricks Limes 01/31/2015, 11:16 AM Hendricks Limes, PT [phone]

## 2015-01-10 NOTE — Progress Notes (Signed)
Subjective:  Patient up out of bed to a chair. Patient reports pain as moderate.  Patient with fever to 102.2 yesterday. Currently afebrile.  Objective:   VITALS:   Filed Vitals:   01/09/15 1935 01/09/15 2125 01/10/15 0353 01/10/15 0744  BP: 149/57  136/52 146/56  Pulse: 96  92 94  Temp: 102.2 F (39 C) 99.9 F (37.7 C) 100.4 F (38 C) 98.6 F (37 C)  TempSrc: Oral Oral Oral Oral  Resp: Height:      Weight:      SpO2: 96%  99% 98%    PHYSICAL EXAM:  Right hip: Patient has intact sensation light touch. She can dorsiflex and plantarflex her ankle as well as flex and extend her toes and the right foot. She has no calf tenderness. She is wearing TED stockings and foot pumps. Her thigh compartments are soft and compressible.   LABS  Results for orders placed or performed during the hospital encounter of 01/07/15 (from the past 24 hour(s))  Glucose, capillary     Status: Abnormal   Collection Time: 01/09/15  8:59 PM  Result Value Ref Range   Glucose-Capillary 245 (H) 65 - 99 mg/dL  CBC     Status: Abnormal   Collection Time: 01/10/15  5:53 AM  Result Value Ref Range   WBC 10.5 3.6 - 11.0 K/uL   RBC 3.11 (L) 3.80 - 5.20 MIL/uL   Hemoglobin 9.1 (L) 12.0 - 16.0 g/dL   HCT 16.1 (L) 09.6 - 04.5 %   MCV 86.0 80.0 - 100.0 fL   MCH 29.4 26.0 - 34.0 pg   MCHC 34.1 32.0 - 36.0 g/dL   RDW 40.9 81.1 - 91.4 %   Platelets 147 (L) 150 - 440 K/uL  Basic metabolic panel     Status: Abnormal   Collection Time: 01/10/15  5:53 AM  Result Value Ref Range   Sodium 138 135 - 145 mmol/L   Potassium 3.7 3.5 - 5.1 mmol/L   Chloride 105 101 - 111 mmol/L   CO2 26 22 - 32 mmol/L   Glucose, Bld 186 (H) 65 - 99 mg/dL   BUN 15 6 - 20 mg/dL   Creatinine, Ser 7.82 0.44 - 1.00 mg/dL   Calcium 7.8 (L) 8.9 - 10.3 mg/dL   GFR calc non Af Amer >60 >60 mL/min   GFR calc Af Amer >60 >60 mL/min   Anion gap 7 5 - 15    Dg Chest 2 View  01/09/2015   CLINICAL DATA:  Fever.  Hip surgery 1 day  prior.  EXAM: CHEST  2 VIEW  COMPARISON:  01/07/2015 chest radiograph  FINDINGS: Stable cardiomediastinal silhouette with top-normal heart size. No pneumothorax. No pleural effusion. Curvilinear opacities are seen at the left greater than right lung bases. No overt pulmonary edema. Moderate degenerative changes are seen in the thoracic spine.  IMPRESSION: Curvilinear opacities at the left greater than right lung bases, favor atelectasis.   Electronically Signed   By: Delbert Phenix M.D.   On: 01/09/2015 14:45   Dg Pelvis Portable  01/08/2015   CLINICAL DATA:  Repair hip fracture.  EXAM: PORTABLE PELVIS 1-2 VIEWS  COMPARISON:  01/07/2015  FINDINGS: Exam demonstrates placement of a right hip prosthesis which is intact and normally located. Skin staples are present over the lateral soft tissues of the right hip. There is minimal degenerative change of the left hip. Remainder of the exam is unchanged.  IMPRESSION: Interval placement of  right hip prosthesis intact and normally located.   Electronically Signed   By: Elberta Fortis M.D.   On: 01/08/2015 17:18   Dg Hip Port Unilat With Pelvis 1v Right  01/08/2015   CLINICAL DATA:  Status post total hip replacement.  EXAM: DG HIP (WITH OR WITHOUT PELVIS) 1V PORT RIGHT  COMPARISON:  01/07/2015  FINDINGS: There has been interval total right hip arthroplasty, with long stem femoral component. The previously demonstrated right femoral neck fracture is no longer seen. The hardware alignment is near anatomic. Expected soft tissue edema and emphysema, and skin staples are seen. Calcified fibroids are noted.  IMPRESSION: Status post right total hip arthroplasty, with prosthetic hardware in good alignment.   Electronically Signed   By: Ted Mcalpine M.D.   On: 01/08/2015 16:40    Assessment/Plan: 2 Days Post-Op   Active Problems:   Closed right hip fracture   Atelectasis   Acute encephalopathy   Dementia  Patient has no growth on her urine culture. Her chest  x-ray demonstrates atelectasis. Patient is encouraged to continue with incentive spirometry while awake. Continue physical occupational therapy. Patient is doing well from an orthopedic standpoint. She will be ready for discharge to rehabilitation tomorrow from an orthopedic standpoint. She will remain partial weightbearing on the right lower extremity for a total of 4 weeks postop. She is continuing to observe posterior hip precautions. She should continue with TED stockings until her follow-up in 2 weeks. She should elevate her right lower extremity whenever possible and may apply ice to the surgical site. She should have wound checks and dressing changes as needed. She should continue taking Lovenox 30 mg subcutaneous twice a day for a total of 4 weeks followed by 2 weeks of enteric-coated aspirin 325 mg daily. I will see the patient in follow-up in 10-14 days for wound check, staple removal and x-ray.   [name] , MD 01/10/2015, 1:48 PM

## 2015-01-10 NOTE — Progress Notes (Signed)
[facility] - [place] at [facility]   PATIENT NAME: [name]    MR#:  [id number]  DATE OF BIRTH:  [birth year]  SUBJECTIVE:  CHIEF COMPLAINT:   Chief Complaint  Patient presents with  . Fall   Pain right hip if moved. Febrile. Sitting in a chair. No BM Foley had to be replaced due to urinary retention.  REVIEW OF SYSTEMS:    Review of Systems  Constitutional: Positive for malaise/fatigue. Negative for fever and chills.  HENT: Negative for sore throat.   Eyes: Negative for blurred vision, double vision and pain.  Respiratory: Negative for cough, hemoptysis, shortness of breath and wheezing.   Cardiovascular: Negative for chest pain, palpitations, orthopnea and leg swelling.  Gastrointestinal: Negative for heartburn, nausea, vomiting, abdominal pain, diarrhea and constipation.  Genitourinary: Negative for dysuria and hematuria.  Musculoskeletal: Positive for back pain and joint pain.  Skin: Negative for rash.  Neurological: Positive for headaches. Negative for sensory change, speech change and focal weakness.  Endo/Heme/Allergies: Does not bruise/bleed easily.  Psychiatric/Behavioral: Negative for depression. The patient is not nervous/anxious.       DRUG ALLERGIES:  No Known Allergies  VITALS:  Blood pressure 146/56, pulse 94, temperature 98.6 F (37 C), temperature source Oral, resp. rate 18, height 5\' 3"  (1.6 m), weight 63.095 kg (139 lb 1.6 oz), SpO2 98 %.  PHYSICAL EXAMINATION:   Physical Exam  GENERAL:  81 y.o.-year-old patient lying in the bed with no acute distress.  EYES: Pupils equal, round, reactive to light and accommodation. No scleral icterus. Extraocular muscles intact.  HEENT: Head atraumatic, normocephalic. Oropharynx and nasopharynx clear.  NECK:  Supple, no jugular venous distention. No thyroid enlargement, no tenderness.  LUNGS: Normal breath sounds bilaterally, no wheezing, rales, rhonchi. No use of accessory  muscles of respiration.  CARDIOVASCULAR: S1, S2 normal. No murmurs, rubs, or gallops.  ABDOMEN: Soft, nontender, nondistended. Bowel sounds present. No organomegaly or mass. Foley catheter in place. Clear urine. EXTREMITIES: No cyanosis, clubbing or edema b/l.    NEUROLOGIC: Cranial nerves II through XII are intact. No focal Motor or sensory deficits b/l.   PSYCHIATRIC: The patient is alert and awake. Sitting in a chair SKIN: No obvious rash, lesion, or ulcer.  Right hip tender. Dressing in place  LABORATORY PANEL:   CBC  Recent Labs Lab 01/10/15 0553  WBC 10.5  HGB 9.1*  HCT 26.7*  PLT 147*   ------------------------------------------------------------------------------------------------------------------  Chemistries   Recent Labs Lab 01/10/15 0553  NA 138  K 3.7  CL 105  CO2 26  GLUCOSE 186*  BUN 15  CREATININE 0.80  CALCIUM 7.8*   ------------------------------------------------------------------------------------------------------------------  Cardiac Enzymes No results for input(s): TROPONINI in the last 168 hours. ------------------------------------------------------------------------------------------------------------------  RADIOLOGY:  Dg Chest 2 View  01/09/2015   CLINICAL DATA:  Fever.  Hip surgery 1 day prior.  EXAM: CHEST  2 VIEW  COMPARISON:  01/07/2015 chest radiograph  FINDINGS: Stable cardiomediastinal silhouette with top-normal heart size. No pneumothorax. No pleural effusion. Curvilinear opacities are seen at the left greater than right lung bases. No overt pulmonary edema. Moderate degenerative changes are seen in the thoracic spine.  IMPRESSION: Curvilinear opacities at the left greater than right lung bases, favor atelectasis.   Electronically Signed   By: Delbert Phenix M.D.   On: 01/09/2015 14:45   Dg Pelvis Portable  01/08/2015   CLINICAL DATA:  Repair hip fracture.  EXAM: PORTABLE PELVIS 1-2 VIEWS  COMPARISON:  01/07/2015  FINDINGS: Exam  demonstrates placement of a right hip prosthesis which is intact and normally located. Skin staples are present over the lateral soft tissues of the right hip. There is minimal degenerative change of the left hip. Remainder of the exam is unchanged.  IMPRESSION: Interval placement of right hip prosthesis intact and normally located.   Electronically Signed   By: Elberta Fortis M.D.   On: 01/08/2015 17:18   Dg Hip Port Unilat With Pelvis 1v Right  01/08/2015   CLINICAL DATA:  Status post total hip replacement.  EXAM: DG HIP (WITH OR WITHOUT PELVIS) 1V PORT RIGHT  COMPARISON:  01/07/2015  FINDINGS: There has been interval total right hip arthroplasty, with long stem femoral component. The previously demonstrated right femoral neck fracture is no longer seen. The hardware alignment is near anatomic. Expected soft tissue edema and emphysema, and skin staples are seen. Calcified fibroids are noted.  IMPRESSION: Status post right total hip arthroplasty, with prosthetic hardware in good alignment.   Electronically Signed   By: Ted Mcalpine M.D.   On: 01/08/2015 16:40     ASSESSMENT AND PLAN:   81 year old female with past medical history of Alzheimer's dementia, diabetes, hypertension, hypothyroidism who presents to the hospital from assisted living after you're fall and noted to have a right hip fracture.  # Fever Likely from atelectasis postop. Check chest x-ray Check urinalysis. On antibiotics. Incentive spirometer  # Right hip fracture POD - 2 DVT prophylaxis, physical therapy and discharge planning per orthopedics.  # Acute blood loss anemia status post surgery Monitor. No need for transfusion at this time.  * Urinary retention likely from narcotics. Remove Foley in the a.m. for voiding trial.  # Acute inpatient delirium over dementia For precautions  # diabetes type 2 without complication-continue Levemir, sliding scale insulin. Uncontrolled Increase Lantus dose to 25 units daily  at bedtime.  # hypothyroidism-continue Synthroid.  # hypertension-continue lisinopril.  # hyperlipidemia-continue simvastatin.  All the records are reviewed and case discussed with Care Management/Social Workerr. Management plans discussed with the patient, family and they are in agreement.  CODE STATUS: FULL  DVT Prophylaxis: SCDs  TOTAL TIME TAKING CARE OF THIS PATIENT: 40 minutes.   Possible discharge tomorrow to skilled nursing facility. Discharge summary has been dictated. Discharge instructions in place along with prescriptions.   Milagros Loll R M.D on 01/10/2015 at 11:32 AM  Between 7am to 6pm - Pager - [phone]  After 6pm go to [email or web address] - password [email or web address] [facility]  [facility] [place] [facility]  Office  [phone]  CC: Primary care physician; CAMPBELL, Kristen Loader, FNP

## 2015-01-11 LAB — CBC
HCT: 25.3 % — ABNORMAL LOW (ref 35.0–47.0)
Hemoglobin: 8.7 g/dL — ABNORMAL LOW (ref 12.0–16.0)
MCH: 29.9 pg (ref 26.0–34.0)
MCHC: 34.6 g/dL (ref 32.0–36.0)
MCV: 86.3 fL (ref 80.0–100.0)
PLATELETS: 167 10*3/uL (ref 150–440)
RBC: 2.93 MIL/uL — AB (ref 3.80–5.20)
RDW: 13.1 % (ref 11.5–14.5)
WBC: 11.2 10*3/uL — AB (ref 3.6–11.0)

## 2015-01-11 LAB — URINE CULTURE: Culture: NO GROWTH

## 2015-01-11 LAB — GLUCOSE, CAPILLARY: GLUCOSE-CAPILLARY: 317 mg/dL — AB (ref 65–99)

## 2015-01-11 MED ORDER — METFORMIN HCL ER 500 MG PO TB24
500.0000 mg | ORAL_TABLET | Freq: Every day | ORAL | Status: DC
  Administered 2015-01-11: 500 mg via ORAL
  Filled 2015-01-11: qty 1

## 2015-01-11 MED ORDER — METFORMIN HCL ER 500 MG PO TB24: 500.0000 mg | ORAL_TABLET | Freq: Every day | ORAL | Status: DC

## 2015-01-11 NOTE — Progress Notes (Signed)
Patient is medically stable for D/C to [facility] today. Per [facility] admissions coordinator at [facility] patient is going to room 503. RN will call report to 500 hall and arrange EMS for transport. Clinical Child psychotherapist (CSW) prepared D/C packet and sent D/C Summary to [facility] yesterday 01/10/15. CSW contacted nurses station at [facility] and made them aware of D/C today. Patient is aware of above. CSW contacted patient's daughter [name] and made her aware of above. RN has agreed to call daughter when EMS arrives. Please reconsult if future social work needs arise. CSW signing off.   Jetta Lout, LCSWA [phone]

## 2015-01-11 NOTE — Clinical Social Work Placement (Signed)
   CLINICAL SOCIAL WORK PLACEMENT  NOTE  Date:  01/11/2015  Patient Details  Name: [name] MRN: [id number] Date of Birth: 27-Jun-1927  Clinical Social Work is seeking post-discharge placement for this patient at the Skilled  Nursing Facility level of care (*CSW will initial, date and re-position this form in  chart as items are completed):  Yes   Patient/family provided with [facility] Clinical Social Work Department's list of facilities offering this level of care within the geographic area requested by the patient (or if unable, by the patient's family).  Yes   Patient/family informed of their freedom to choose among providers that offer the needed level of care, that participate in Medicare, Medicaid or managed care program needed by the patient, have an available bed and are willing to accept the patient.  Yes   Patient/family informed of [facility]'s ownership interest in [facility] and [facility], as well as of the fact that they are under no obligation to receive care at these facilities.  PASRR submitted to EDS on 01/08/15     PASRR number received on 01/08/15     Existing PASRR number confirmed on       FL2 transmitted to all facilities in geographic area requested by pt/family on 01/09/15     FL2 transmitted to all facilities within larger geographic area on       Patient informed that his/her managed care company has contracts with or will negotiate with certain facilities, including the following:        Yes   Patient/family informed of bed offers received.  Patient chooses bed at  [facility] )     Physician recommends and patient chooses bed at      Patient to be transferred to  [facility] ) on 01/11/15.  Patient to be transferred to facility by  [facility] )     Patient family notified on 01/11/15 of transfer.  Name of family member notified:   (CSW attempted to contacted patient's daughter [name] today but did ntot get  an answer. CSW spoke with daughter Friday 01/10/15 and made her aware  of D/C Saturday. )     PHYSICIAN       Additional Comment:    _______________________________________________ Haig Prophet, LCSW 01/11/2015, 10:36 AM

## 2015-01-11 NOTE — Progress Notes (Signed)
[facility] - [place] at [facility]   PATIENT NAME: [name]    MR#:  [id number]  DATE OF BIRTH:  December 01, 1927  SUBJECTIVE:  CHIEF COMPLAINT:   Chief Complaint  Patient presents with  . Fall   Had BM, foley catheter taken out- waiting to void.  REVIEW OF SYSTEMS:    Review of Systems  Constitutional: Positive for malaise/fatigue. Negative for fever and chills.  HENT: Negative for sore throat.   Eyes: Negative for blurred vision, double vision and pain.  Respiratory: Negative for cough, hemoptysis, shortness of breath and wheezing.   Cardiovascular: Negative for chest pain, palpitations, orthopnea and leg swelling.  Gastrointestinal: Negative for heartburn, nausea, vomiting, abdominal pain, diarrhea and constipation.  Genitourinary: Negative for dysuria and hematuria.  Musculoskeletal: Positive for back pain and joint pain.  Skin: Negative for rash.  Neurological: Positive for headaches. Negative for sensory change, speech change and focal weakness.  Endo/Heme/Allergies: Does not bruise/bleed easily.  Psychiatric/Behavioral: Negative for depression. The patient is not nervous/anxious.       DRUG ALLERGIES:  No Known Allergies  VITALS:  Blood pressure 137/60, pulse 91, temperature 98.9 F (37.2 C), temperature source Oral, resp. rate 18, height  (1.6 m), weight 63.095 kg (139 lb 1.6 oz), SpO2 95 %.  PHYSICAL EXAMINATION:   Physical Exam  GENERAL:  81 y.o.-year-old patient lying in the bed with no acute distress.  EYES: Pupils equal, round, reactive to light and accommodation. No scleral icterus. Extraocular muscles intact.  HEENT: Head atraumatic, normocephalic. Oropharynx and nasopharynx clear.  NECK:  Supple, no jugular venous distention. No thyroid enlargement, no tenderness.  LUNGS: Normal breath sounds bilaterally, no wheezing, rales, rhonchi. No use of accessory muscles of respiration.  CARDIOVASCULAR: S1, S2 normal. No  murmurs, rubs, or gallops.  ABDOMEN: Soft, nontender, nondistended. Bowel sounds present. No organomegaly or mass. EXTREMITIES: No cyanosis, clubbing or edema b/l.    NEUROLOGIC: Cranial nerves II through XII are intact. No focal Motor or sensory deficits b/l.   PSYCHIATRIC: The patient is alert and awake.  SKIN: No obvious rash, lesion, or ulcer.  Right hip tender. Dressing in place  LABORATORY PANEL:   CBC  Recent Labs Lab 01/11/15 0403  WBC 11.2*  HGB 8.7*  HCT 25.3*  PLT 167   ------------------------------------------------------------------------------------------------------------------  Chemistries   Recent Labs Lab 01/10/15 0553  NA 138  K 3.7  CL 105  CO2 26  GLUCOSE 186*  BUN 15  CREATININE 0.80  CALCIUM 7.8*   ------------------------------------------------------------------------------------------------------------------  Cardiac Enzymes No results for input(s): TROPONINI in the last 168 hours. ------------------------------------------------------------------------------------------------------------------  RADIOLOGY:  Dg Chest 2 View  01/09/2015   CLINICAL DATA:  Fever.  Hip surgery 1 day prior.  EXAM: CHEST  2 VIEW  COMPARISON:  01/07/2015 chest radiograph  FINDINGS: Stable cardiomediastinal silhouette with top-normal heart size. No pneumothorax. No pleural effusion. Curvilinear opacities are seen at the left greater than right lung bases. No overt pulmonary edema. Moderate degenerative changes are seen in the thoracic spine.  IMPRESSION: Curvilinear opacities at the left greater than right lung bases, favor atelectasis.   Electronically Signed   By: Delbert Phenix M.D.   On: 01/09/2015 14:45     ASSESSMENT AND PLAN:   81 year old female with past medical history of Alzheimer's dementia, diabetes, hypertension, hypothyroidism who presents to the hospital from assisted living after you're fall and noted to have a right hip fracture.  # Fever Likely  from atelectasis postop. Check chest  x-ray Check urinalysis. No antibiotics. Incentive spirometer encouraged.  No fever in last 36 hours.  # Right hip fracture POD - 2 DVT prophylaxis, physical therapy and discharge planning.  # Acute blood loss anemia status post surgery Monitor. No need for transfusion at this time.  * Urinary retention likely from narcotics. Remove Foley again this a.m. for voiding trial.  # Acute inpatient delirium over dementia For precautions  # diabetes type 2 without complication-continue Levemir, sliding scale insulin. Uncontrolled Increase Lantus dose to 25 units daily at bedtime.  Resume her metformin now.  # hypothyroidism-continue Synthroid.  # hypertension-continue lisinopril.  # hyperlipidemia-continue simvastatin.  All the records are reviewed and case discussed with Care Management/Social Workerr. Management plans discussed with the patient, family and they are in agreement.  CODE STATUS: FULL  DVT Prophylaxis: SCDs  TOTAL TIME TAKING CARE OF THIS PATIENT: 40 minutes.   Discharge today.  Altamese Dilling M.D on 01/11/2015 at 9:05 AM  Between 7am to 6pm - Pager - [phone]  After 6pm go to [email or web address] - password [email or web address] [facility]  [place] [facility]  Office  [phone]  CC: Primary care physician; CAMPBELL, Kristen Loader, FNP

## 2015-01-11 NOTE — Progress Notes (Addendum)
Physical Therapy Treatment Patient Details Name: [name] MRN: [id number] DOB: 10-04-1927 Today's Date: 01/11/2015    History of Present Illness This patient is an 81 year old female who came to [facility] after a fall at her assisted living facility. She suffered a R subcapital femoral neck hip fracture (with mild impaction at fx site) and s/p R hip hemiarthroplasty repair (posterior) 01/08/15.   Pt was able to be assisted to chair with walker then finished with PT directly assisting pt to slide/pivot the last of the trip.  Pt is agitated and lacks confidence in her ability to move, but with prompting is very good.  Nsg to call PT if needed to get back to bed.  PT Comments       Follow Up Recommendations  SNF     Equipment Recommendations  Rolling walker with 5" wheels    Recommendations for Other Services       Precautions / Restrictions Precautions Precautions: Fall;Posterior Hip Precaution Booklet Issued: Yes (comment) Restrictions Weight Bearing Restrictions: Yes RLE Weight Bearing: Partial weight bearing    Mobility  Bed Mobility Overal bed mobility: Needs Assistance Bed Mobility: Supine to Sit     Supine to sit: Max assist     General bed mobility comments: assist for trunk and B LE's  Transfers Overall transfer level: Needs assistance Equipment used: Rolling walker (2 wheeled) Transfers: Stand Pivot Transfers;Sit to/from Stand Sit to Stand: Max assist Stand pivot transfers: Max assist       General transfer comment: into chair bedside with PT assisting to place her abduction cushion in place  Ambulation/Gait             General Gait Details: Pt is fearful and cannot perform   Stairs            Wheelchair Mobility    Modified Rankin (Stroke Patients Only)       Balance                                    Cognition Arousal/Alertness: Awake/alert Behavior During Therapy: Agitated;Anxious (calmer once up in  chair) Overall Cognitive Status: No family/caregiver present to determine baseline cognitive functioning       Memory: Decreased recall of precautions;Decreased short-term memory              Exercises Total Joint Exercises Ankle Circles/Pumps: AROM;Both;5 reps Quad Sets: AROM;Both;10 reps Heel Slides: AAROM;Right;10 reps Hip ABduction/ADduction: AAROM;AROM;Both;10 reps    General Comments General comments (skin integrity, edema, etc.): Pt was able to be calmed to try to stand bedside, then pivoted with walker and finally finished  with PT holding pt directly to shift final trip to chair.      Pertinent Vitals/Pain Pain Assessment: Faces Pain Score: 8  Faces Pain Scale: Hurts whole lot Pain Location: R hip with movement to get OOB Pain Intervention(s): Monitored during session;Premedicated before session    Home Living                      Prior Function            PT Goals (current goals can now be found in the care plan section) Acute Rehab PT Goals Patient Stated Goal: Wants to go home Progress towards PT goals: Progressing toward goals    Frequency  BID    PT Plan Current plan remains appropriate    Co-evaluation  End of Session Equipment Utilized During Treatment: Gait belt Activity Tolerance: Patient limited by fatigue;Patient limited by pain (fear she will hurt) Patient left: with call bell/phone within reach;in chair;with chair alarm set     Time: [phone] PT Time Calculation (min) (ACUTE ONLY): 24 min  Charges:  $Therapeutic Activity: 23-37 mins                    G Codes:      Ivar Drape 2015/02/08, 10:42 AM   Samul Dada, PT MS Acute Rehab Dept. Number: [facility] [id number] and [facility] [phone]

## 2015-02-03 ENCOUNTER — Ambulatory Visit (INDEPENDENT_AMBULATORY_CARE_PROVIDER_SITE_OTHER): Payer: Medicare Other | Admitting: Urology

## 2015-02-03 ENCOUNTER — Encounter: Payer: Self-pay | Admitting: Urology

## 2015-02-03 VITALS — BP 142/62 | HR 94 | Temp 98.3°F | Resp 18

## 2015-02-03 DIAGNOSIS — R339 Retention of urine, unspecified: Secondary | ICD-10-CM | POA: Diagnosis not present

## 2015-02-03 MED ORDER — TAMSULOSIN HCL 0.4 MG PO CAPS: 0.4000 mg | ORAL_CAPSULE | Freq: Every day | ORAL | Status: DC

## 2015-02-03 NOTE — Progress Notes (Signed)
02/03/2015 9:40 PM   [name] 1928/02/03 [id number]  Referring Cullen Vanallen: Clemetine Marker, FNP [address] [place], [place] [address]  Chief Complaint  Patient presents with  . Urinary Retention  . Establish Care    HPI: Patient is an 81 year old African-American female with a history of Alzheimer's who suffered a hip fracture and subsequently hast experienced urinary retention. She has failed one voiding trial and she presents today for further evaluation.   She presents today with her caregiver who has only been with her for a few weeks. She describes the patient as being independent and living in a assisted living facility with minimal urinary troubles.  The caregiver denies any gross hematuria, complaints of dysuria or suprapubic pain.  She is not sure of the patient's remote urinary history at this time.  The patient is repetitive during the visit. She is very anxious about having incontinence of urine and stool in her bed.  She continuously blames herself for falling.  She keeps repeated, "I shouldn't have fell."  PMH: Past Medical History  Diagnosis Date  . Alzheimer's dementia   . Diabetes type 2, controlled   . Hypothyroidism   . Vision impairment   . Hearing impairment   . Hypertension   . Unsteady gait     Surgical History: Past Surgical History  Procedure Laterality Date  . No past surgeries    . Hip arthroplasty Right 01/08/2015    Procedure: ARTHROPLASTY BIPOLAR HIP (HEMIARTHROPLASTY);  Surgeon: [name], MD;  Location: [facility] ORS;  Service: Orthopedics;  Laterality: Right;    Home Medications:    Medication List       This list is accurate as of: 02/03/15 11:59 PM.  Always use your most recent med list.               aspirin EC 81 MG tablet  Take 81 mg by mouth daily.     docusate sodium 100 MG capsule  Commonly known as:  COLACE  Take 1 capsule (100 mg total) by mouth 2 (two) times daily.     enoxaparin 30 MG/0.3ML  injection  Commonly known as:  LOVENOX  Inject 0.3 mLs (30 mg total) into the skin every 12 (twelve) hours.     ferrous sulfate 325 (65 FE) MG tablet  Take 1 tablet (325 mg total) by mouth 2 (two) times daily with a meal.     GLUCERNA Liqd  Take 237 mLs by mouth.     insulin aspart protamine- aspart (70-30) 100 UNIT/ML injection  Commonly known as:  NOVOLOG MIX 70/30  Inject into the skin.     insulin detemir 100 UNIT/ML injection  Commonly known as:  LEVEMIR  Inject 0.25 mLs (25 Units total) into the skin at bedtime.     levothyroxine 75 MCG tablet  Commonly known as:  SYNTHROID, LEVOTHROID  Take 75 mcg by mouth daily.     lisinopril 5 MG tablet  Commonly known as:  PRINIVIL,ZESTRIL  Take 5 mg by mouth daily.     metFORMIN 500 MG 24 hr tablet  Commonly known as:  GLUCOPHAGE-XR  Take 1 tablet (500 mg total) by mouth daily with breakfast.     mupirocin ointment 2 %  Commonly known as:  BACTROBAN  Apply topically.     oxyCODONE 5 MG immediate release tablet  Commonly known as:  Oxy IR/ROXICODONE  Take 1 tablet (5 mg total) by mouth every 4 (four) hours as needed for breakthrough pain ((  for MODERATE breakthrough pain)).     polyethylene glycol packet  Commonly known as:  MIRALAX / GLYCOLAX  Take 17 g by mouth daily as needed for mild constipation.     simvastatin 20 MG tablet  Commonly known as:  ZOCOR  Take 20 mg by mouth every evening.     tamsulosin 0.4 MG Caps capsule  Commonly known as:  FLOMAX  Take 1 capsule (0.4 mg total) by mouth daily.     traZODone 50 MG tablet  Commonly known as:  DESYREL  Take 50 mg by mouth at bedtime.     vitamin B-12 1000 MCG tablet  Commonly known as:  CYANOCOBALAMIN  Take 2,000 mcg by mouth daily.        Allergies: No Known Allergies  Family History: Family History  Problem Relation Age of Onset  . Family history unknown: Yes    Social History:  reports that she has never smoked. She does not have any smokeless  tobacco history on file. She reports that she does not drink alcohol or use illicit drugs.  ROS: UROLOGY Frequent Urination?: No Hard to postpone urination?: No Burning/pain with urination?: No Get up at night to urinate?: No Leakage of urine?: No Urine stream starts and stops?: No Trouble starting stream?: No Do you have to strain to urinate?: No Blood in urine?: No Urinary tract infection?: No Sexually transmitted disease?: No Injury to kidneys or bladder?: No Painful intercourse?: No Weak stream?: No Currently pregnant?: No Vaginal bleeding?: No Last menstrual period?: n  Gastrointestinal Nausea?: No Vomiting?: No Indigestion/heartburn?: No Diarrhea?: No Constipation?: No  Constitutional Fever: No Night sweats?: No Weight loss?: Yes Fatigue?: No  Skin Skin rash/lesions?: No Itching?: No  Eyes Blurred vision?: No Double vision?: No  Ears/Nose/Throat Sore throat?: No Sinus problems?: No  Hematologic/Lymphatic Swollen glands?: No Easy bruising?: No  Cardiovascular Leg swelling?: No Chest pain?: No  Respiratory Cough?: No Shortness of breath?: No  Endocrine Excessive thirst?: No  Musculoskeletal Back pain?: No Joint pain?: Yes  Neurological Headaches?: No Dizziness?: No  Psychologic Depression?: Yes Anxiety?: Yes  Physical Exam: BP 142/62 mmHg  Pulse 94  Temp(Src) 98.3 F (36.8 C)  Resp 18  Wt   Constitutional:  Alert and oriented, No acute distress. HEENT: [place] AT, moist mucus membranes.  Trachea midline, no masses. Cardiovascular: No clubbing, cyanosis, or edema. Respiratory: Normal respiratory effort, no increased work of breathing. GI: Abdomen is soft, nontender, nondistended, no abdominal masses GU: No CVA tenderness.  Skin: No rashes, bruises or suspicious lesions. Lymph: No cervical or inguinal adenopathy. Neurologic: Grossly intact, no focal deficits, moving all 4 extremities. Psychiatric: Normal mood and  affect.  Laboratory Data: Lab Results  Component Value Date   WBC 11.2* 01/11/2015   HGB 8.7* 01/11/2015   HCT 25.3* 01/11/2015   MCV 86.3 01/11/2015   PLT 167 01/11/2015    Lab Results  Component Value Date   CREATININE 0.80 01/10/2015     Assessment & Plan:    1. Urinary retention:   Patient has failed one voiding trial. I will start her on tamsulosin 0.4 mg once daily in an effort to improve her bladder emptying.  2. Hip fracture resulting in nonambulatory status:   Patient is nonambulatory at this time and from her records she does not seem to be cooperative with physical therapy. I encouraged the patient to engage in physical therapy and try to be as ambulatory as she can to help increase her chances of a  successful voiding trial.    Return in about 2 weeks (around 02/17/2015) for cath out in the am.  Michiel Cowboy, [facility] [address], [address] [place], [place] [address] [phone]

## 2015-02-04 DIAGNOSIS — R339 Retention of urine, unspecified: Secondary | ICD-10-CM | POA: Insufficient documentation

## 2015-02-08 ENCOUNTER — Emergency Department
Admission: EM | Admit: 2015-02-08 | Discharge: 2015-02-08 | Disposition: A | Discharge status: Home / Self Care | Payer: Medicare Other | Attending: Emergency Medicine | Admitting: Emergency Medicine

## 2015-02-08 ENCOUNTER — Encounter: Payer: Self-pay | Admitting: Medical Oncology

## 2015-02-08 DIAGNOSIS — Y846 Urinary catheterization as the cause of abnormal reaction of the patient, or of later complication, without mention of misadventure at the time of the procedure: Secondary | ICD-10-CM | POA: Insufficient documentation

## 2015-02-08 DIAGNOSIS — Z794 Long term (current) use of insulin: Secondary | ICD-10-CM | POA: Insufficient documentation

## 2015-02-08 DIAGNOSIS — E119 Type 2 diabetes mellitus without complications: Secondary | ICD-10-CM | POA: Insufficient documentation

## 2015-02-08 DIAGNOSIS — T83091A Other mechanical complication of indwelling urethral catheter, initial encounter: Secondary | ICD-10-CM | POA: Diagnosis present

## 2015-02-08 DIAGNOSIS — Z466 Encounter for fitting and adjustment of urinary device: Secondary | ICD-10-CM

## 2015-02-08 DIAGNOSIS — G309 Alzheimer's disease, unspecified: Secondary | ICD-10-CM | POA: Insufficient documentation

## 2015-02-08 DIAGNOSIS — F0280 Dementia in other diseases classified elsewhere without behavioral disturbance: Secondary | ICD-10-CM | POA: Insufficient documentation

## 2015-02-08 DIAGNOSIS — I10 Essential (primary) hypertension: Secondary | ICD-10-CM | POA: Insufficient documentation

## 2015-02-08 DIAGNOSIS — Z7982 Long term (current) use of aspirin: Secondary | ICD-10-CM | POA: Diagnosis not present

## 2015-02-08 DIAGNOSIS — Z79899 Other long term (current) drug therapy: Secondary | ICD-10-CM | POA: Diagnosis not present

## 2015-02-08 NOTE — ED Notes (Signed)
Pt from [facility] via ems with reports that pt accidentally pulled her foley out. Pt had rt hip surgery on 01/11/15 and has had foley in since then.

## 2015-02-08 NOTE — ED Notes (Signed)
Discharge instructions given to [place] at [facility]. No questions from receiving staff.

## 2015-02-08 NOTE — ED Notes (Signed)
Assumed pt care at this time. NAD noted. RR even and nonlabored. Will continue to monitor. 

## 2015-02-08 NOTE — ED Provider Notes (Signed)
[facility] Emergency Department Provider Note  Time seen: 7:12 PM  I have reviewed the triage vital signs and the nursing notes.   HISTORY  Chief Complaint foley pulled out    HPI [name] is a 81 y.o. female with a past medical history of Alzheimer's dementia, diabetes, hypertension, presents the emergency department after her Foley catheter became dislodged. According to report the patient is currently in a nursing facility/rehabilitation center following a right hip fracture, status post surgery. Patient's Foley catheter was accidentally pulled out this afternoon so they sent her to the emergency department for Foley catheter replacement. Patient has no complaints, does have a history of dementia which makes her history somewhat questionable, but she appears very well, calm, cooperative, no distress, pleasant.     Past Medical History  Diagnosis Date  . Alzheimer's dementia   . Diabetes type 2, controlled (HCC)   . Hypothyroidism   . Vision impairment   . Hearing impairment   . Hypertension   . Unsteady gait     Patient Active Problem List   Diagnosis Date Noted  . Urinary retention 02/04/2015  . Atelectasis 01/10/2015  . Acute encephalopathy 01/10/2015  . Dementia 01/10/2015  . Closed right hip fracture 01/07/2015    Past Surgical History  Procedure Laterality Date  . No past surgeries    . Hip arthroplasty Right 01/08/2015    Procedure: ARTHROPLASTY BIPOLAR HIP (HEMIARTHROPLASTY);  Surgeon: [name], MD;  Location: [facility] ORS;  Service: Orthopedics;  Laterality: Right;    Current Outpatient Rx  Name  Route  Sig  Dispense  Refill  . aspirin EC 81 MG tablet   Oral   Take 81 mg by mouth daily.         Marland Kitchen docusate sodium (COLACE) 100 MG capsule   Oral   Take 1 capsule (100 mg total) by mouth 2 (two) times daily.   10 capsule   0   . enoxaparin (LOVENOX) 30 MG/0.3ML injection   Subcutaneous   Inject 0.3 mLs (30 mg  total) into the skin every 12 (twelve) hours. Patient not taking: Reported on 02/03/2015   42 Syringe   0   . ferrous sulfate 325 (65 FE) MG tablet   Oral   Take 1 tablet (325 mg total) by mouth 2 (two) times daily with a meal.   60 tablet   0   . GLUCERNA (GLUCERNA) LIQD   Oral   Take 237 mLs by mouth.         . insulin aspart protamine- aspart (NOVOLOG MIX 70/30) (70-30) 100 UNIT/ML injection   Subcutaneous   Inject into the skin.         Marland Kitchen insulin detemir (LEVEMIR) 100 UNIT/ML injection   Subcutaneous   Inject 0.25 mLs (25 Units total) into the skin at bedtime.   10 mL   0   . levothyroxine (SYNTHROID, LEVOTHROID) 75 MCG tablet   Oral   Take 75 mcg by mouth daily.         Marland Kitchen lisinopril (PRINIVIL,ZESTRIL) 5 MG tablet   Oral   Take 5 mg by mouth daily.         . metFORMIN (GLUCOPHAGE-XR) 500 MG 24 hr tablet   Oral   Take 1 tablet (500 mg total) by mouth daily with breakfast.   30 tablet   0   . mupirocin ointment (BACTROBAN) 2 %   Topical   Apply topically.         Marland Kitchen  oxyCODONE (OXY IR/ROXICODONE) 5 MG immediate release tablet   Oral   Take 1 tablet (5 mg total) by mouth every 4 (four) hours as needed for breakthrough pain ((for MODERATE breakthrough pain)).   30 tablet   0   . polyethylene glycol (MIRALAX / GLYCOLAX) packet   Oral   Take 17 g by mouth daily as needed for mild constipation.   14 each   0   . simvastatin (ZOCOR) 20 MG tablet   Oral   Take 20 mg by mouth every evening.         . tamsulosin (FLOMAX) 0.4 MG CAPS capsule   Oral   Take 1 capsule (0.4 mg total) by mouth daily.   30 capsule   3   . traZODone (DESYREL) 50 MG tablet   Oral   Take 50 mg by mouth at bedtime.         . vitamin B-12 (CYANOCOBALAMIN) 1000 MCG tablet   Oral   Take 2,000 mcg by mouth daily.           Allergies Review of patient's allergies indicates no known allergies.  Family History  Problem Relation Age of Onset  . Family history  unknown: Yes    Social History Social History  Substance Use Topics  . Smoking status: Never Smoker   . Smokeless tobacco: None  . Alcohol Use: No    Review of Systems Constitutional: Negative for fever. Cardiovascular: Negative for chest pain. Respiratory: Negative for shortness of breath. Gastrointestinal: Negative for abdominal pain Neurological: Negative for headache 10-point ROS otherwise negative. Possibly limited due to history of dementia.  ____________________________________________   PHYSICAL EXAM:  VITAL SIGNS: ED Triage Vitals  Enc Vitals Group     BP 02/08/15 1843 137/72 mmHg     Pulse Rate 02/08/15 1843 86     Resp 02/08/15 1843 18     Temp 02/08/15 1843 98.8 F (37.1 C)     Temp Source 02/08/15 1843 Oral     SpO2 02/08/15 1843 98 %     Weight 02/08/15 1843 119 lb 0.8 oz (54 kg)     Height --      Head Cir --      Peak Flow --      Pain Score 02/08/15 1844 0     Pain Loc --      Pain Edu? --      Excl. in GC? --     Constitutional: Alert, no distress, well-appearing. Eyes: Normal exam ENT   Head: Normocephalic and atraumatic.   Mouth/Throat: Mucous membranes are moist. Cardiovascular: Regular rate. Respiratory: Normal respiratory effort without tachypnea nor retractions. Breath sounds are clear  Gastrointestinal: Soft and nontender. No distention.   Musculoskeletal: Well-appearing surgical incision to the right hip. Nontender. Neurologic:  Normal speech and language. No gross focal neurologic deficits  Skin:  Skin is warm, dry and intact.  Psychiatric: Calm, cooperative.   ____________________________________________     INITIAL IMPRESSION / ASSESSMENT AND PLAN / ED COURSE  Pertinent labs & imaging results that were available during my care of the patient were reviewed by me and considered in my medical decision making (see chart for details).  Patient presents from [facility] nursing facility after her Foley catheter was  accidentally dislodged. Patient has no complaints. Patient appears well. We'll replace the Foley catheter and have the patient transported back to her nursing facility.   ____________________________________________   FINAL CLINICAL IMPRESSION(S) / ED DIAGNOSES  Foley catheter replacement*  Minna Antis, MD 02/08/15 [phone]

## 2015-02-10 ENCOUNTER — Ambulatory Visit (INDEPENDENT_AMBULATORY_CARE_PROVIDER_SITE_OTHER): Payer: Medicare Other

## 2015-02-10 DIAGNOSIS — R339 Retention of urine, unspecified: Secondary | ICD-10-CM

## 2015-02-10 NOTE — Progress Notes (Signed)
Catheter Removal  Patient is present today for a catheter removal.  10ml of water was drained from the balloon. A 16FR foley cath was removed from the bladder no complications were noted . Patient tolerated well.  Preformed by: Eligha Bridegroom, CMA  Follow up/ Additional notes: Patient and caregiver were instructed for her to drink plenty of water today and if she is unable to void by 3pm to call and come back to the office for a PVR.

## 2015-02-10 NOTE — Progress Notes (Signed)
Patient came back to the office this afternoon complaining of not being able to urinate. Patient's caregiver states that she has refused to drink water since leaving this morning because she is afraid of leakage.  Per Michiel Cowboy, PAC a bladder scan was done, results . Per Carollee Herter patient was instructed to drink more water and we would not replace the cath at this time. The caregiver was also told to stress the importance of water in regards to patient being able to urinate and in regards to her hydration and to make sure this was monitored incase she would need IV fluids if she continues not to drink.   Bladder Scan Patient cannot void: 137 ml Performed By: Eligha Bridegroom, CMA

## 2015-02-27 ENCOUNTER — Encounter: Payer: Medicare Other | Attending: Surgery | Admitting: Surgery

## 2015-02-27 DIAGNOSIS — Z794 Long term (current) use of insulin: Secondary | ICD-10-CM | POA: Diagnosis not present

## 2015-02-27 DIAGNOSIS — E11621 Type 2 diabetes mellitus with foot ulcer: Secondary | ICD-10-CM | POA: Insufficient documentation

## 2015-02-27 DIAGNOSIS — M1990 Unspecified osteoarthritis, unspecified site: Secondary | ICD-10-CM | POA: Diagnosis not present

## 2015-02-27 DIAGNOSIS — G309 Alzheimer's disease, unspecified: Secondary | ICD-10-CM | POA: Diagnosis not present

## 2015-02-27 DIAGNOSIS — L89521 Pressure ulcer of left ankle, stage 1: Secondary | ICD-10-CM | POA: Diagnosis not present

## 2015-02-27 DIAGNOSIS — I10 Essential (primary) hypertension: Secondary | ICD-10-CM | POA: Insufficient documentation

## 2015-02-27 DIAGNOSIS — D649 Anemia, unspecified: Secondary | ICD-10-CM | POA: Insufficient documentation

## 2015-02-27 DIAGNOSIS — L89620 Pressure ulcer of left heel, unstageable: Secondary | ICD-10-CM | POA: Insufficient documentation

## 2015-02-27 DIAGNOSIS — F0390 Unspecified dementia without behavioral disturbance: Secondary | ICD-10-CM | POA: Diagnosis not present

## 2015-02-28 NOTE — Progress Notes (Signed)
[name] ([id number]) Visit Report for 02/27/2015 Abuse/Suicide Risk Screen Details Patient Name: [name], [name] 02/27/2015 2:30 Date of Service: PM Medical Record [id number] Number: Patient Account Number: [id number] 12-03-1927 (81 y.o. Treating RN: Clover Mealy, RN, BSN, [place] [facility] Date of Birth/Sex: Female) Other Clinician: Primary Care Physician: CAMPBELL, MARGARET Treating Britto, Errol Referring Physician: Physician/Extender: Tania Ade in Treatment: 0 Abuse/Suicide Risk Screen Items Answer ABUSE/SUICIDE RISK SCREEN: Has anyone close to you tried to hurt or harm you recentlyo No Do you feel uncomfortable with anyone in your familyo No Has anyone forced you do things that you didnot want to doo No Do you have any thoughts of harming yourselfo No Patient displays signs or symptoms of abuse and/or neglect. No Electronic Signature(s) Signed: 02/27/2015 2:42:52 PM By: Elpidio Eric BSN, RN Entered By: Elpidio Eric on 02/27/2015 14:42:52 Betancur, [name] ([id number]) -------------------------------------------------------------------------------- Activities of Daily Living Details Patient Name: [name] 02/27/2015 2:30 Date of Service: PM Medical Record [id number] Number: Patient Account Number: [id number] 1927/05/20 (81 y.o. Treating RN: Clover Mealy, RN, BSN, [place] [facility] Date of Birth/Sex: Female) Other Clinician: Primary Care Physician: CAMPBELL, MARGARET Treating Britto, Errol Referring Physician: Physician/Extender: Tania Ade in Treatment: 0 Activities of Daily Living Items Answer Activities of Daily Living (Please select one for each item) Drive Automobile Not Able Take Medications Need Assistance Use Telephone Need Assistance Care for Appearance Need Assistance Use Toilet Need Assistance Bath / Shower Need Assistance Dress Self Need Assistance Feed Self Completely Able Walk Need Assistance Get In / Out Bed Need Assistance Housework Not Able Prepare Meals Not Able Handle  Money Not Able Shop for Self Not Able Electronic Signature(s) Signed: 02/27/2015 2:50:10 PM By: Elpidio Eric BSN, RN Entered By: Elpidio Eric on 02/27/2015 14:50:10 Gloria Fate ([id number]) -------------------------------------------------------------------------------- Education Assessment Details Patient Name: [name] 02/27/2015 2:30 Date of Service: PM Medical Record [id number] Number: Patient Account Number: [id number] [birth year] (81 y.o. 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 Learning Preferences/Education Level/Primary Language Learning Preference: Explanation Highest Education Level: High School Preferred Language: English Cognitive Barrier Assessment/Beliefs Language Barrier: Yes Translator Needed: No Physical Barrier Assessment Impaired Vision: No Impaired Hearing: Yes Decreased Hand dexterity: No Knowledge/Comprehension Assessment Knowledge Level: Low Comprehension Level: Low Ability to understand written Low instructions: Ability to understand verbal Low instructions: Motivation Assessment Anxiety Level: Calm Cooperation: Cooperative Interest in Health Problems: Uninterested Perception: Confused Willingness to Engage in Self- Low Management Activities: Readiness to Engage in Self- Low Management Activities: Electronic Signature(s) Signed: 02/27/2015 2:49:34 PM By: Elpidio Eric BSN, RN Entered By: Elpidio Eric on 02/27/2015 14:49:34 BEVIN, DAS ([id number]) SHIRLENE, ANDAYA ([id number]) -------------------------------------------------------------------------------- Fall Risk Assessment Details Patient Name: [name] 02/27/2015 2:30 Date of Service: PM Medical  Record [id number] Number: Patient Account Number: [id number] Dec 13, 1927 (81 y.o. Treating RN: Clover Mealy, RN, BSN, [place] [facility] Date of Birth/Sex: Female) Other Clinician: Primary Care Physician: CAMPBELL, MARGARET Treating Britto, Errol Referring Physician: Physician/Extender: Tania Ade in Treatment: 0 Fall Risk Assessment Items FALL RISK ASSESSMENT: History of falling - immediate or within 3 months 0 No Secondary diagnosis 0 No Ambulatory aid None/bed rest/wheelchair/nurse 0 Yes Crutches/cane/walker 0 No Furniture 0 No IV Access/Saline Lock 0 No Gait/Training Normal/bed rest/immobile 0 Yes Weak 0 No Impaired 0 No Mental Status Oriented to own ability 0 Yes Electronic Signature(s) Signed: 02/27/2015 2:48:14 PM By: Elpidio Eric BSN, RN Entered By: Elpidio Eric on 02/27/2015 14:48:14 [place], Rainelle ([id number]) --------------------------------------------------------------------------------  Foot Assessment Details Patient Name: [name], [name] 02/27/2015 2:30 Date of Service: PM Medical Record [id number] Number: Patient Account Number: [id number] 1927-09-27 (81 y.o. Treating RN: Clover MealyAfful, RN, BSN, [place] Date of Birth/Sex: Female) Other Clinician: Primary Care Physician: CAMPBELL, MARGARET Treating Britto, Errol Referring Physician: Physician/Extender: Tania AdeWeeks in Treatment: 0 Foot Assessment Items Site Locations + = Sensation present, - = Sensation absent, C = Callus, U = Ulcer R = Redness, W = Warmth, M = Maceration, PU = Pre-ulcerative lesion F = Fissure, S = Swelling, D = Dryness Assessment Right: Left: Other Deformity: No No Prior Foot Ulcer: No No Prior Amputation: No No Charcot Joint: No No Ambulatory Status: Ambulatory With Help Assistance Device: Walker Gait: Surveyor, miningUnsteady Electronic Signature(s) Signed: 02/27/2015 2:47:43 PM By: Elpidio EricAfful, Rita BSN, RN Entered By: Elpidio EricAfful, Rita on 02/27/2015 14:47:43 Salata, Venesha ([id number]) Roel, Aleena  ([id number]) -------------------------------------------------------------------------------- Nutrition Risk Assessment Details Patient Name: [name], Lidia 02/27/2015 2:30 Date of Service: PM Medical Record [id number] Number: Patient Account Number: [id number] 1927-09-27 (81 y.o. Treating RN: Clover MealyAfful, RN, BSN, [place] Date of Birth/Sex: Female) Other Clinician: Primary Care Physician: CAMPBELL, MARGARET Treating Britto, Errol Referring Physician: Physician/Extender: Tania AdeWeeks in Treatment: 0 Height (in): 64 Weight (lbs): Body Mass Index (BMI): Nutrition Risk Assessment Items NUTRITION RISK SCREEN: I have an illness or condition that made me change the kind and/or 0 No amount of food I eat I eat fewer than two meals per day 0 No I eat few fruits and vegetables, or milk products 2 Yes I have three or more drinks of beer, liquor or wine almost every day 0 No I have tooth or mouth problems that make it hard for me to eat 0 No I don't always have enough money to buy the food I need 0 No I eat alone most of the time 0 No I take three or more different prescribed or over-the-counter drugs a 0 No day Without wanting to, I have lost or gained 10 pounds in the last six 2 Yes months I am not always physically able to shop, cook and/or feed myself 0 No Nutrition Protocols Good Risk Protocol Provide education on Moderate Risk Protocol 0 nutrition Electronic Signature(s) Signed: 02/27/2015 2:47:57 PM By: Elpidio EricAfful, Rita BSN, RN Entered By: Elpidio EricAfful, Rita on 02/27/2015 14:47:57

## 2015-02-28 NOTE — Progress Notes (Addendum)
Gloria, [name] ([id number]) Visit Report for 02/27/2015 Chief Complaint Document Details Patient Name: [name], [name] 02/27/2015 2:30 Date of Service: PM Medical Record [id number] Number: Patient Account Number: [id number] 1927/09/19 (81 y.o. Treating RN: Clover Mealy, RN, BSN, [place] Date of Birth/Sex: Female) Other Clinician: Primary Care Physician: CAMPBELL, MARGARET Treating Lekeya Rollings Referring Physician: Physician/Extender: Tania Ade in Treatment: 0 Information Obtained from: Patient Chief Complaint Patient is at the clinic for treatment of an open pressure ulcer. Recently noticed to have a pressure injury to the left heel for about 2 weeks now. Electronic Signature(s) Signed: 02/27/2015 3:30:01 PM By: Evlyn Kanner MD, FACS Entered By: Evlyn Kanner on 02/27/2015 15:30:01 GIANA, CASTNER ([id number]) -------------------------------------------------------------------------------- HPI Details Patient Name: Gloria, [name] 02/27/2015 2:30 Date of Service: PM Medical Record [id number] Number: Patient Account Number: [id number] Mar 09, 1928 (81 y.o. Treating RN: Clover Mealy, RN, BSN, [place] Date of Birth/Sex: Female) Other Clinician: Primary Care Physician: CAMPBELL, MARGARET Treating Nikola Blackston Referring Physician: Physician/Extender: Tania Ade in Treatment: 0 History of Present Illness Location: left heel and dorsum of the foot Quality: Patient reports No Pain. Severity: Patient states wound (s) are getting better. Duration: Patient has had the wound for < 2 weeks prior to presenting for treatment Timing: Pain in wound is Intermittent (comes and goes Context: The wound appeared gradually over time Modifying Factors: Other treatment(s) tried include: local dressings and offloading Associated Signs and Symptoms: Patient reports having difficulty standing for long periods. HPI Description: 81 year old patient with past medical history of Alzheimer's dementia, diabetes  mellitus, hypertension and recent history of right hip arthroplasty in August 2016. Her past medical history is significant also for hypothyroidism, hypertension, urinary retention, dementia. Electronic Signature(s) Signed: 02/27/2015 3:32:04 PM By: Evlyn Kanner MD, FACS Previous Signature: 02/27/2015 2:47:07 PM Version By: Evlyn Kanner MD, FACS Previous Signature: 02/27/2015 2:38:31 PM Version By: Evlyn Kanner MD, FACS Entered By: Evlyn Kanner on 02/27/2015 15:32:04 IVONNE, [place] ([id number]) -------------------------------------------------------------------------------- Physical Exam Details Patient Name: Gloria, [name] 02/27/2015 2:30 Date of Service: PM Medical Record [id number] Number: Patient Account Number: [id number] 11-28-1927 (81 y.o. Treating RN: Clover Mealy, RN, BSN, [place] Date of Birth/Sex: Female) Other Clinician: Primary Care Physician: CAMPBELL, MARGARET Treating Klark Vanderhoef Referring Physician: Physician/Extender: Tania Ade in Treatment: 0 Constitutional . Pulse regular. Respirations normal and unlabored. Afebrile. . Eyes Nonicteric. Reactive to light. Ears, Nose, Mouth, and Throat Lips, teeth, and gums WNL.Marland Kitchen Moist mucosa without lesions . Neck supple and nontender. No palpable supraclavicular or cervical adenopathy. Normal sized without goiter. Respiratory WNL. No retractions.. Breath sounds WNL, No rubs, rales, rhonchi, or wheeze.. Chest Breasts symmetical and no nipple discharge.. Breast tissue WNL, no masses, lumps, or tenderness.. Gastrointestinal (GI) Abdomen without masses or tenderness.. No liver or spleen enlargement or tenderness.. Lymphatic No adneopathy. No adenopathy. No adenopathy. Musculoskeletal Adexa without tenderness or enlargement.. Digits and nails w/o clubbing, cyanosis, infection, petechiae, ischemia, or inflammatory conditions.. Integumentary (Hair, Skin) No suspicious lesions. No crepitus or fluctuance. No peri-wound warmth or  erythema. No masses.Marland Kitchen Psychiatric Judgement and insight Intact.. No evidence of depression, anxiety, or agitation.. Notes She has a pressure injury on the left heel which is indeterminate. There is mild superficial abrasions also on the dorsum of the left ankle. Electronic Signature(s) Signed: 02/27/2015 3:33:08 PM By: Evlyn Kanner MD, FACS Entered By: Evlyn Kanner on 02/27/2015 15:33:07 ARMINA, GALLOWAY ([id number]) -------------------------------------------------------------------------------- Physician Orders Details Patient Name: Gloria, [name] 02/27/2015 2:30 Date of Service: PM Medical Record [id number] Number: Patient Account Number: [id number] 1927-12-01 (81 y.o. Treating RN: Clover Mealy, RN, BSN, [place] Date of Birth/Sex: Female) Other  Clinician: Primary Care Physician: Loney Laurence Treating Dutch Ing Referring Physician: Physician/Extender: Tania Ade in Treatment: 0 Verbal / Phone Orders: Yes Clinician: Afful, RN, BSN, Rita Read Back and Verified: Yes Diagnosis Coding Wound Cleansing Wound #1 Left Calcaneous o Cleanse wound with mild soap and water Wound #2 Left,Dorsal Foot o Cleanse wound with mild soap and water Anesthetic Wound #1 Left Calcaneous o Topical Lidocaine 4% cream applied to wound bed prior to debridement Wound #2 Left,Dorsal Foot o Topical Lidocaine 4% cream applied to wound bed prior to debridement Primary Wound Dressing o Other: - Sorbact Secondary Dressing Wound #1 Left Calcaneous o Boardered Foam Dressing - heel protectors Wound #2 Left,Dorsal Foot o Boardered Foam Dressing - heel protectors Dressing Change Frequency Wound #1 Left Calcaneous o Dressing is to be changed Monday and Thursday. Wound #2 Left,Dorsal Foot o Dressing is to be changed Monday and Thursday. Follow-up Appointments Wound #1 Left Calcaneous o Return Appointment in 1 week. Moseman, Mairi ([id number]) Wound #2 Left,Dorsal Foot o Return  Appointment in 1 week. Off-Loading Wound #1 Left Calcaneous o Heel suspension boot to: - [employer] Wound #2 Left,Dorsal Foot o Heel suspension boot to: - [facility] Wound #1 Left Calcaneous o [facility] Visits - [facility] o Home Health Nurse may visit PRN to address patientos wound care needs. o FACE TO FACE ENCOUNTER: MEDICARE and MEDICAID PATIENTS: I certify that this patient is under my care and that I had a face-to-face encounter that meets the physician face-to-face encounter requirements with this patient on this date. The encounter with the patient was in whole or in part for the following MEDICAL CONDITION: (primary reason for Home Healthcare) MEDICAL NECESSITY: I certify, that based on my findings, NURSING services are a medically necessary home health service. HOME BOUND STATUS: I certify that my clinical findings support that this patient is homebound (i.e., Due to illness or injury, pt requires aid of supportive devices such as crutches, cane, wheelchairs, walkers, the use of special transportation or the assistance of another person to leave their place of residence. There is a normal inability to leave the home and doing so requires considerable and taxing effort. Other absences are for medical reasons / religious services and are infrequent or of short duration when for other reasons). o If current dressing causes regression in wound condition, may D/C ordered dressing product/s and apply Normal Saline Moist Dressing daily until next Wound Healing Center / Other MD appointment. Notify Wound Healing Center of regression in wound condition at [phone]. o Please direct any NON-WOUND related issues/requests for orders to patient's Primary Care Physician Wound #2 Left,Dorsal Foot o Continue [facility] Visits - [facility] o Home Health Nurse may visit PRN to address patientos wound care needs. o FACE TO FACE ENCOUNTER: MEDICARE and  MEDICAID PATIENTS: I certify that this patient is under my care and that I had a face-to-face encounter that meets the physician face-to-face encounter requirements with this patient on this date. The encounter with the patient was in whole or in part for the following MEDICAL CONDITION: (primary reason for Home Healthcare) MEDICAL NECESSITY: I certify, that based on my findings, NURSING services are a medically necessary home health service. HOME BOUND STATUS: I certify that my clinical findings support that this patient is homebound (i.e., Due to illness or injury, pt requires aid of supportive devices such as crutches, cane, wheelchairs, walkers, the use of special transportation or the assistance of another person to leave their place of residence.  There is a normal inability to leave the home and doing so requires considerable and taxing effort. Other absences are for medical reasons / religious services and are infrequent or of short duration when for other reasons). Genna, Shadee ([id number]) o If current dressing causes regression in wound condition, may D/C ordered dressing product/s and apply Normal Saline Moist Dressing daily until next Wound Healing Center / Other MD appointment. Notify Wound Healing Center of regression in wound condition at [phone]. o Please direct any NON-WOUND related issues/requests for orders to patient's Primary Care Physician Electronic Signature(s) Signed: 02/27/2015 3:18:10 PM By: Elpidio Eric BSN, RN Signed: 02/27/2015 4:31:29 PM By: Evlyn Kanner MD, FACS Entered By: Elpidio Eric on 02/27/2015 15:18:10 Paugh, [name] ([id number]) -------------------------------------------------------------------------------- Problem List Details Patient Name: ITZY, ADLER 02/27/2015 2:30 Date of Service: PM Medical Record [id number] Number: Patient Account Number: [id number] [birth year] (81 y.o. Treating RN: Afful, RN, BSN, [place] Sink Date of  Birth/Sex: Female) Other Clinician: Primary Care Physician: CAMPBELL, MARGARET Treating Randa Riss Referring Physician: Physician/Extender: Tania Ade in Treatment: 0 Active Problems ICD-10 Encounter Code Description Active Date Diagnosis E11.621 Type 2 diabetes mellitus with foot ulcer 02/27/2015 Yes L89.620 Pressure ulcer of left heel, unstageable 02/27/2015 Yes L89.521 Pressure ulcer of left ankle, stage 1 02/27/2015 Yes G30.9 Alzheimer's disease, unspecified 02/27/2015 Yes Inactive Problems Resolved Problems Electronic Signature(s) Signed: 02/27/2015 3:31:02 PM By: Evlyn Kanner MD, FACS Previous Signature: 02/27/2015 3:29:30 PM Version By: Evlyn Kanner MD, FACS Entered By: Evlyn Kanner on 02/27/2015 15:31:02 Sciulli, [name] ([id number]) -------------------------------------------------------------------------------- Progress Note Details Patient Name: STAISHA, WINIARSKI 02/27/2015 2:30 Date of Service: PM Medical Record [id number] Number: Patient Account Number: [id number] 26-Nov-1927 (81 y.o. Treating RN: Clover Mealy, RN, BSN, [place] Sink Date of Birth/Sex: Female) Other Clinician: Primary Care Physician: CAMPBELL, MARGARET Treating Averil Digman Referring Physician: Physician/Extender: Tania Ade in Treatment: 0 Subjective Chief Complaint Information obtained from Patient Patient is at the clinic for treatment of an open pressure ulcer. Recently noticed to have a pressure injury to the left heel for about 2 weeks now. History of Present Illness (HPI) The following HPI elements were documented for the patient's wound: Location: left heel and dorsum of the foot Quality: Patient reports No Pain. Severity: Patient states wound (s) are getting better. Duration: Patient has had the wound for < 2 weeks prior to presenting for treatment Timing: Pain in wound is Intermittent (comes and goes Context: The wound appeared gradually over time Modifying Factors: Other treatment(s) tried  include: local dressings and offloading Associated Signs and Symptoms: Patient reports having difficulty standing for long periods. 81 year old patient with past medical history of Alzheimer's dementia, diabetes mellitus, hypertension and recent history of right hip arthroplasty in August 2016. Her past medical history is significant also for hypothyroidism, hypertension, urinary retention, dementia. Wound History Patient presents with 2 open wounds that have been present for approximately 58month. Patient has been treating wounds in the following manner: dry dressing. Laboratory tests have not been performed in the last month. Patient reportedly has not tested positive for an antibiotic resistant organism. Patient reportedly has not tested positive for osteomyelitis. Patient reportedly has not had testing performed to evaluate circulation in the legs. Patient History Information obtained from Patient. Allergies no known allergies Family History MERISA, JULIO ([id number]) Diabetes - Father, Mother, Heart Disease - Siblings, Hypertension - Mother, Father, No family history of Cancer, Hereditary Spherocytosis, Kidney Disease, Lung Disease, Seizures, Stroke, Thyroid Problems, Tuberculosis. Social History Never smoker, Alcohol Use - Never, Drug Use - No History, Caffeine Use - Daily. Medical History  Eyes Denies history of Cataracts, Glaucoma, Optic Neuritis Ear/Nose/Mouth/Throat Denies history of Chronic sinus problems/congestion, Middle ear problems Hematologic/Lymphatic Patient has history of Anemia Denies history of Hemophilia, Human Immunodeficiency Virus, Lymphedema, Sickle Cell Disease Respiratory Denies history of Aspiration, Asthma, Chronic Obstructive Pulmonary Disease (COPD), Pneumothorax, Sleep Apnea, Tuberculosis Cardiovascular Patient has history of Hypertension Gastrointestinal Denies history of Cirrhosis , Colitis, Crohn s, Hepatitis A, Hepatitis B, Hepatitis  C Endocrine Patient has history of Type II Diabetes Genitourinary Denies history of End Stage Renal Disease Immunological Denies history of Lupus Erythematosus, Raynaud s, Scleroderma Integumentary (Skin) Patient has history of History of pressure wounds Musculoskeletal Patient has history of Osteoarthritis Neurologic Patient has history of Dementia Denies history of Neuropathy, Quadriplegia, Paraplegia, Seizure Disorder Patient is treated with Insulin. Blood sugar is not tested. Blood sugar results noted at the following times: Breakfast - 233. Medical And Surgical History Notes Endocrine hypothyrodism Neurologic alzeihmers Review of Systems (ROS) Constitutional Symptoms (General Health) The patient has no complaints or symptoms. Eyes Complains or has symptoms of Glasses / Contacts. Ear/Nose/Mouth/Throat Tiller, Novalie ([id number]) The patient has no complaints or symptoms. Hematologic/Lymphatic Complains or has symptoms of Bleeding / Clotting Disorders. Respiratory The patient has no complaints or symptoms. Gastrointestinal The patient has no complaints or symptoms. Endocrine The patient has no complaints or symptoms. Genitourinary The patient has no complaints or symptoms. Immunological The patient has no complaints or symptoms. Integumentary (Skin) Complains or has symptoms of Wounds. Musculoskeletal Complains or has symptoms of Muscle Weakness. Neurologic The patient has no complaints or symptoms. Medications aspirin 325 mg tablet oral 1 1 tablet oral Glucophage 500 mg tablet oral 1 1 tablet oral Zocor 20 mg tablet oral 1 1 tablet oral lisinopril 5 mg tablet oral 1 1 tablet oral Flomax 0.4 mg capsule oral 1 1 capsule,extended release 24hr oral Debrox 6.5 % ear drops otic 5 5 drops otic Levemir 100 unit/mL subcutaneous solution subcutaneous solution subcutaneous Novolog 100 unit/mL subcutaneous solution subcutaneous solution subcutaneous ferrous sulfate  325 mg (65 mg iron) tablet oral 1 1 tablet oral Senna-S 8.6 mg-50 mg tablet oral 2 2 tablet oral Glucerna oral liquid oral 1 1 liquid oral Zoloft 50 mg tablet oral 1 1 tablet oral trazodone 50 mg tablet oral 1 1 tablet oral Synthroid 75 mcg tablet oral 1 1 tablet oral Objective Constitutional Pulse regular. Respirations normal and unlabored. Afebrile. Vitals Time Taken: 2:36 PM, Height: 64 in, Source: Stated, Temperature: 98.5 F, Pulse: 86 bpm, Domzalski, Deepti ([id number]) Respiratory Rate: 16 breaths/min, Blood Pressure: 116/51 mmHg. Eyes Nonicteric. Reactive to light. Ears, Nose, Mouth, and Throat Lips, teeth, and gums WNL.Marland Kitchen. Moist mucosa without lesions . Neck supple and nontender. No palpable supraclavicular or cervical adenopathy. Normal sized without goiter. Respiratory WNL. No retractions.. Breath sounds WNL, No rubs, rales, rhonchi, or wheeze.. Chest Breasts symmetical and no nipple discharge.. Breast tissue WNL, no masses, lumps, or tenderness.. Gastrointestinal (GI) Abdomen without masses or tenderness.. No liver or spleen enlargement or tenderness.. Lymphatic No adneopathy. No adenopathy. No adenopathy. Musculoskeletal Adexa without tenderness or enlargement.. Digits and nails w/o clubbing, cyanosis, infection, petechiae, ischemia, or inflammatory conditions.Marland Kitchen. Psychiatric Judgement and insight Intact.. No evidence of depression, anxiety, or agitation.. General Notes: She has a pressure injury on the left heel which is indeterminate. There is mild superficial abrasions also on the dorsum of the left ankle. Integumentary (Hair, Skin) No suspicious lesions. No crepitus or fluctuance. No peri-wound warmth or erythema. No masses.. Wound #1 status is Open. Original cause of  wound was Pressure Injury. The wound is located on the Left Calcaneous. The wound measures 3.5cm length x 3.5cm width x 0.1cm depth; 9.621cm^2 area and 0.962cm^3 volume. The wound is limited to skin  breakdown. There is no tunneling or undermining noted. There is a none present amount of drainage noted. The wound margin is indistinct and nonvisible. There is no granulation within the wound bed. There is a large (67-100%) amount of necrotic tissue within the wound bed including Eschar. The periwound skin appearance exhibited: Dry/Scaly. The periwound skin appearance did not exhibit: Callus, Crepitus, Excoriation, Fluctuance, Friable, Induration, Localized Edema, Rash, Scarring, Maceration, Moist, Atrophie Blanche, Cyanosis, Ecchymosis, Hemosiderin Staining, Mottled, Pallor, Rubor, Erythema. Periwound temperature was noted as No Abnormality. The periwound has tenderness on palpation. Wound #2 status is Open. Original cause of wound was Pressure Injury. The wound is located on the Left,Dorsal Foot. The wound measures 1cm length x 2cm width x 0.1cm depth; 1.571cm^2 area and Nardone, Shirlie ([id number]) 0.157cm^3 volume. The wound is limited to skin breakdown. There is no tunneling or undermining noted. There is a small amount of drainage noted. The wound margin is indistinct and nonvisible. There is medium (34-66%) pink, pale granulation within the wound bed. There is a medium (34-66%) amount of necrotic tissue within the wound bed including Adherent Slough. The periwound skin appearance exhibited: Dry/Scaly, Moist. The periwound skin appearance did not exhibit: Callus, Crepitus, Excoriation, Fluctuance, Friable, Induration, Localized Edema, Rash, Scarring, Maceration, Atrophie Blanche, Cyanosis, Ecchymosis, Hemosiderin Staining, Mottled, Pallor, Rubor, Erythema. Periwound temperature was noted as No Abnormality. The periwound has tenderness on palpation. Assessment Active Problems ICD-10 E11.621 - Type 2 diabetes mellitus with foot ulcer L89.620 - Pressure ulcer of left heel, unstageable L89.521 - Pressure ulcer of left ankle, stage 1 G30.9 - Alzheimer's disease, unspecified This elderly  patient with Alzheimer's dementia has a pressure injury to the left heel which is indeterminate and unstageable at the present time. I have recommended some sort of backed with heel protectors and have discussed offloading stage boots. We've also discussed with the nursing home attendant to give her an appropriate amount of nutrition, vitamin supplements and methods of offloading her heel. She will come back and see as an regular basis. Plan Wound Cleansing: Wound #1 Left Calcaneous: Cleanse wound with mild soap and water Wound #2 Left,Dorsal Foot: Cleanse wound with mild soap and water Anesthetic: Wound #1 Left Calcaneous: Topical Lidocaine 4% cream applied to wound bed prior to debridement Wound #2 Left,Dorsal Foot: Topical Lidocaine 4% cream applied to wound bed prior to debridement Primary Wound Dressing: Other: - Sorbact Secondary Dressing: Wound #1 Left Calcaneous: Campanile, Aileena ([id number]) Boardered Foam Dressing - heel protectors Wound #2 Left,Dorsal Foot: Boardered Foam Dressing - heel protectors Dressing Change Frequency: Wound #1 Left Calcaneous: Dressing is to be changed Monday and Thursday. Wound #2 Left,Dorsal Foot: Dressing is to be changed Monday and Thursday. Follow-up Appointments: Wound #1 Left Calcaneous: Return Appointment in 1 week. Wound #2 Left,Dorsal Foot: Return Appointment in 1 week. Off-Loading: Wound #1 Left Calcaneous: Heel suspension boot to: - [employer] Wound #2 Left,Dorsal Foot: Heel suspension boot to: [facility]: Wound #1 Left Calcaneous: Continue [facility] Visits - [facility] Nurse may visit PRN to address patient s wound care needs. FACE TO FACE ENCOUNTER: MEDICARE and MEDICAID PATIENTS: I certify that this patient is under my care and that I had a face-to-face encounter that meets the physician face-to-face encounter requirements with this patient on this  date. The encounter with the patient was in  whole or in part for the following MEDICAL CONDITION: (primary reason for Home Healthcare) MEDICAL NECESSITY: I certify, that based on my findings, NURSING services are a medically necessary home health service. HOME BOUND STATUS: I certify that my clinical findings support that this patient is homebound (i.e., Due to illness or injury, pt requires aid of supportive devices such as crutches, cane, wheelchairs, walkers, the use of special transportation or the assistance of another person to leave their place of residence. There is a normal inability to leave the home and doing so requires considerable and taxing effort. Other absences are for medical reasons / religious services and are infrequent or of short duration when for other reasons). If current dressing causes regression in wound condition, may D/C ordered dressing product/s and apply Normal Saline Moist Dressing daily until next Wound Healing Center / Other MD appointment. Notify Wound Healing Center of regression in wound condition at [phone]. Please direct any NON-WOUND related issues/requests for orders to patient's Primary Care Physician Wound #2 Left,Dorsal Foot: Continue Home Health Visits - [facility] Nurse may visit PRN to address patient s wound care needs. FACE TO FACE ENCOUNTER: MEDICARE and MEDICAID PATIENTS: I certify that this patient is under my care and that I had a face-to-face encounter that meets the physician face-to-face encounter requirements with this patient on this date. The encounter with the patient was in whole or in part for the following MEDICAL CONDITION: (primary reason for Home Healthcare) MEDICAL NECESSITY: I certify, that based on my findings, NURSING services are a medically necessary home health service. HOME BOUND STATUS: I certify that my clinical findings support that this patient is homebound (i.e., Due to illness or injury, pt requires aid of supportive devices such as crutches,  cane, wheelchairs, walkers, the use of special transportation or the assistance of another person to leave their place of residence. There is a normal inability to leave the home and doing so requires considerable and taxing effort. Other absences are for medical reasons / religious services and are infrequent or of short duration when for other reasons). If current dressing causes regression in wound condition, may D/C ordered dressing product/s and apply Normal Saline Moist Dressing daily until next Wound Healing Center / Other MD appointment. Notify Wound Groleau, Iyani ([id number]) Healing Center of regression in wound condition at [phone]. Please direct any NON-WOUND related issues/requests for orders to patient's Primary Care Physician This elderly patient with Alzheimer's dementia has a pressure injury to the left heel which is indeterminate and unstageable at the present time. I have recommended some sort of backed with heel protectors and have discussed offloading stage boots. We've also discussed with the nursing home attendant to give her an appropriate amount of nutrition, vitamin supplements and methods of offloading her heel. She will come back and see as an regular basis. Electronic Signature(s) Signed: 03/03/2015 8:18:01 AM By: Evlyn Kanner MD, FACS Previous Signature: 02/27/2015 3:34:32 PM Version By: Evlyn Kanner MD, FACS Entered By: Evlyn Kanner on 03/03/2015 08:18:01 KETINA, MARS ([id number]) -------------------------------------------------------------------------------- ROS/PFSH Details Patient Name: GAGE, TREIBER 02/27/2015 2:30 Date of Service: PM Medical Record [id number] Number: Patient Account Number: [id number] Nov 12, 1927 (81 y.o. Treating RN: Clover Mealy, RN, BSN, [place] Sink Date of Birth/Sex: Female) Other Clinician: Primary Care Physician: CAMPBELL, MARGARET Treating Treylan Mcclintock Referring Physician: Physician/Extender: Tania Ade in Treatment:  0 Information Obtained From Patient Wound History Do you currently have one or more open woundso Yes How many open  wounds do you currently haveo 2 Approximately how long have you had your woundso 87month How have you been treating your wound(s) until nowo dry dressing Has your wound(s) ever healed and then re-openedo No Have you had any lab work done in the past montho No Have you tested positive for an antibiotic resistant organism (MRSA, VRE)o No Have you tested positive for osteomyelitis (bone infection)o No Have you had any tests for circulation on your legso No Eyes Complaints and Symptoms: Positive for: Glasses / Contacts Medical History: Negative for: Cataracts; Glaucoma; Optic Neuritis Hematologic/Lymphatic Complaints and Symptoms: Positive for: Bleeding / Clotting Disorders Medical History: Positive for: Anemia Negative for: Hemophilia; Human Immunodeficiency Virus; Lymphedema; Sickle Cell Disease Integumentary (Skin) Complaints and Symptoms: Positive for: Wounds Medical History: Positive for: History of pressure wounds Musculoskeletal Baldinger, Kanda ([id number]) Complaints and Symptoms: Positive for: Muscle Weakness Medical History: Positive for: Osteoarthritis Constitutional Symptoms (General Health) Complaints and Symptoms: No Complaints or Symptoms Ear/Nose/Mouth/Throat Complaints and Symptoms: No Complaints or Symptoms Medical History: Negative for: Chronic sinus problems/congestion; Middle ear problems Respiratory Complaints and Symptoms: No Complaints or Symptoms Medical History: Negative for: Aspiration; Asthma; Chronic Obstructive Pulmonary Disease (COPD); Pneumothorax; Sleep Apnea; Tuberculosis Cardiovascular Medical History: Positive for: Hypertension Gastrointestinal Complaints and Symptoms: No Complaints or Symptoms Medical History: Negative for: Cirrhosis ; Colitis; Crohnos; Hepatitis A; Hepatitis B; Hepatitis C Endocrine Complaints  and Symptoms: No Complaints or Symptoms Medical History: Positive for: Type II Diabetes Past Medical History Notes: hypothyrodism Time with diabetes: years Cuello, Erum ([id number]) Treated with: Insulin Blood sugar tested every day: No Blood sugar testing results: Breakfast: 233 Genitourinary Complaints and Symptoms: No Complaints or Symptoms Medical History: Negative for: End Stage Renal Disease Immunological Complaints and Symptoms: No Complaints or Symptoms Medical History: Negative for: Lupus Erythematosus; Raynaudos; Scleroderma Neurologic Complaints and Symptoms: No Complaints or Symptoms Medical History: Positive for: Dementia Negative for: Neuropathy; Quadriplegia; Paraplegia; Seizure Disorder Past Medical History Notes: alzeihmers Family and Social History Cancer: No; Diabetes: Yes - Father, Mother; Heart Disease: Yes - Siblings; Hereditary Spherocytosis: No; Hypertension: Yes - Mother, Father; Kidney Disease: No; Lung Disease: No; Seizures: No; Stroke: No; Thyroid Problems: No; Tuberculosis: No; Never smoker; Alcohol Use: Never; Drug Use: No History; Caffeine Use: Daily; Advanced Directives: No; Patient does not want information on Advanced Directives; Living Will: No Physician Affirmation I have reviewed and agree with the above information. Electronic Signature(s) Signed: 02/27/2015 3:27:05 PM By: Evlyn Kanner MD, FACS Signed: 02/28/2015 11:50:26 AM By: Elpidio Eric BSN, RN Previous Signature: 02/27/2015 2:47:14 PM Version By: Elpidio Eric BSN, RN Entered By: Evlyn Kanner on 02/27/2015 15:27:04 ZAYLA, AGAR ([id number]) -------------------------------------------------------------------------------- SuperBill Details Patient Name: [name] Date of Service: 02/27/2015 Medical Record Patient Account Number: [id number] Number: Afful, RN, BSN, Treating RN: July 27, 1927 (81 y.o. [place] Sink Date of Birth/Sex: Female) Other  Clinician: Primary Care Physician: CAMPBELL, MARGARET Treating Keaden Gunnoe Referring Physician: Physician/Extender: Tania Ade in Treatment: 0 Diagnosis Coding ICD-10 Codes Code Description E11.621 Type 2 diabetes mellitus with foot ulcer L89.620 Pressure ulcer of left heel, unstageable L89.521 Pressure ulcer of left ankle, stage 1 G30.9 Alzheimer's disease, unspecified Facility Procedures CPT4 Code: 08657846 Description: 99214 - WOUND CARE VISIT-LEV 4 EST PT Modifier: Quantity: 1 Physician Procedures CPT4 Code: 9629528 Description: 99204 - WC PHYS LEVEL 4 - NEW PT ICD-10 Description Diagnosis E11.621 Type 2 diabetes mellitus with foot ulcer L89.620 Pressure ulcer of left heel, unstageable L89.521 Pressure ulcer of left ankle, stage 1 G30.9 Alzheimer's disease,  unspecified Modifier: Quantity: 1 Electronic  Signature(s) Signed: 02/27/2015 3:34:47 PM By: Evlyn Kanner MD, FACS Entered By: Evlyn Kanner on 02/27/2015 15:34:47

## 2015-02-28 NOTE — Progress Notes (Addendum)
SAHITI, JOSWICK ([id number]) Visit Report for 02/27/2015 Allergy List Details Patient Name: [name], [name] Date of Service: 02/27/2015 2:30 PM Medical Record Number: [id number] Patient Account Number: [id number] Date of Birth/Sex: October 25, 1927 (82 y.o. Female) Treating RN: Clover Mealy, RN, BSN, [place] Sink Primary Care Physician: Loney Laurence Other Clinician: Referring Physician: Treating Physician/Extender: Rudene Re in Treatment: 0 Allergies Active Allergies no known allergies Allergy Notes Electronic Signature(s) Signed: 02/27/2015 2:30:28 PM By: Elpidio Eric BSN, RN Entered By: Elpidio Eric on 02/27/2015 14:30:28 [name] ([id number]) -------------------------------------------------------------------------------- Arrival Information Details Patient Name: [name] Date of Service: 02/27/2015 2:30 PM Medical Record Number: [id number] Patient Account Number: [id number] Date of Birth/Sex: 1927-05-29 (81 y.o. Female) Treating RN: Afful, RN, BSN, [place] Sink Primary Care Physician: Loney Laurence Other Clinician: Referring Physician: Treating Physician/Extender: Rudene Re in Treatment: 0 Visit Information Patient Arrived: Wheel Chair Arrival Time: 14:32 Accompanied By: CAREGIVER Transfer Assistance: None Patient Identification Verified: Yes Secondary Verification Process Yes Completed: Patient Requires Transmission- No Based Precautions: Patient Has Alerts: Yes Patient Alerts: Patient on Blood Thinner ABI: Non Compressible Electronic Signature(s) Signed: 02/27/2015 3:06:20 PM By: Elpidio Eric BSN, RN Previous Signature: 02/27/2015 2:33:08 PM Version By: Elpidio Eric BSN, RN Entered By: Elpidio Eric on 02/27/2015 15:06:20 [name] ([id number]) -------------------------------------------------------------------------------- Clinic Level of Care Assessment Details Patient Name: [name] Date of Service: 02/27/2015 2:30  PM Medical Record Number: [id number] Patient Account Number: [id number] Date of Birth/Sex: 1928/04/15 (81 y.o. Female) Treating RN: Afful, RN, BSN,  Sink Primary Care Physician: Loney Laurence Other Clinician: Referring Physician: Treating Physician/Extender: Rudene Re in Treatment: 0 Clinic Level of Care Assessment Items TOOL 2 Quantity Score []  - Use when only an EandM is performed on the INITIAL visit 0 ASSESSMENTS - Nursing Assessment / Reassessment X - General Physical Exam (combine w/ comprehensive assessment (listed just 1 20 below) when performed on new pt. evals) X - Comprehensive Assessment (HX, ROS, Risk Assessments, Wounds Hx, etc.) 1 25 ASSESSMENTS - Wound and Skin Assessment / Reassessment []  - Simple Wound Assessment / Reassessment - one wound 0 X - Complex Wound Assessment / Reassessment - multiple wounds 2 5 []  - Dermatologic / Skin Assessment (not related to wound area) 0 ASSESSMENTS - Ostomy and/or Continence Assessment and Care []  - Incontinence Assessment and Management 0 []  - Ostomy Care Assessment and Management (repouching, etc.) 0 PROCESS - Coordination of Care X - Simple Patient / Family Education for ongoing care 1 15 []  - Complex (extensive) Patient / Family Education for ongoing care 0 X - Staff obtains Chiropractor, Records, Test Results / Process Orders 1 10 []  - Staff telephones HHA, Nursing Homes / Clarify orders / etc 0 []  - Routine Transfer to another Facility (non-emergent condition) 0 []  - Routine Hospital Admission (non-emergent condition) 0 []  - New Admissions / Manufacturing engineer / Ordering NPWT, Apligraf, etc. 0 []  - Emergency Hospital Admission (emergent condition) 0 X - Simple Discharge Coordination 1 10 [name], [name] ([id number]) []  - Complex (extensive) Discharge Coordination 0 PROCESS - Special Needs []  - Pediatric / Minor Patient Management 0 []  - Isolation Patient Management 0 []  - Hearing / Language / Visual  special needs 0 []  - Assessment of Community assistance (transportation, D/C planning, etc.) 0 []  - Additional assistance / Altered mentation 0 []  - Support Surface(s) Assessment (bed, cushion, seat, etc.) 0 INTERVENTIONS - Wound Cleansing / Measurement X - Wound Imaging (photographs - any number of wounds) 1 5 []  - Wound Tracing (instead of photographs) 0 []  - Simple Wound  Measurement - one wound 0 X - Complex Wound Measurement - multiple wounds 2 5  - Simple Wound Cleansing - one wound 0  - Complex Wound Cleansing - multiple wounds 0 INTERVENTIONS - Wound Dressings X - Small Wound Dressing one or multiple wounds 2 10  - Medium Wound Dressing one or multiple wounds 0  - Large Wound Dressing one or multiple wounds 0  - Application of Medications - injection 0 INTERVENTIONS - Miscellaneous  - External ear exam 0  - Specimen Collection (cultures, biopsies, blood, body fluids, etc.) 0  - Specimen(s) / Culture(s) sent or taken to Lab for analysis 0  - Patient Transfer (multiple staff / Nurse, adult / Similar devices) 0  - Simple Staple / Suture removal (25 or less) 0  - Complex Staple / Suture removal (26 or more) 0 [name], [name] ([id number])  - Hypo / Hyperglycemic Management (close monitor of Blood Glucose) 0  - Ankle / Brachial Index (ABI) - do not check if billed separately 0 Has the patient been seen at the hospital within the last three years: Yes Total Score: 125 Level Of Care: New/Established - Level 4 Electronic Signature(s) Signed: 02/27/2015 3:27:50 PM By: Elpidio Eric BSN, RN Entered By: Elpidio Eric on 02/27/2015 15:27:50 [name] ([id number]) -------------------------------------------------------------------------------- Encounter Discharge Information Details Patient Name: [name] Date of Service: 02/27/2015 2:30 PM Medical Record Number: [id number] Patient Account Number: [id number] Date of Birth/Sex: [birth year] (81 y.o.  Female) Treating RN: Afful, RN, BSN, [place] Sink Primary Care Physician: Loney Laurence Other Clinician: Referring Physician: Treating Physician/Extender: Rudene Re in Treatment: 0 Encounter Discharge Information Items Discharge Pain Level: 0 Discharge Condition: Stable Ambulatory Status: Wheelchair Discharge Destination: Nursing Home Transportation: Other Accompanied By: caregiver Schedule Follow-up Appointment: No Medication Reconciliation completed and provided to Patient/Care No Atom Solivan: Provided on Clinical Summary of Care: 02/27/2015 Form Type Recipient Paper Patient BM Electronic Signature(s) Signed: 02/27/2015 3:39:24 PM By: Elpidio Eric BSN, RN Previous Signature: 02/27/2015 3:29:15 PM Version By: Gwenlyn Perking Entered By: Elpidio Eric on 02/27/2015 15:39:24 Carvey, [name] ([id number]) -------------------------------------------------------------------------------- Lower Extremity Assessment Details Patient Name: [name] Date of Service: 02/27/2015 2:30 PM Medical Record Number: [id number] Patient Account Number: [id number] Date of Birth/Sex: 1928-03-20 (81 y.o. Female) Treating RN: Afful, RN, BSN, [place] Sink Primary Care Physician: Loney Laurence Other Clinician: Referring Physician: Treating Physician/Extender: Rudene Re in Treatment: 0 Edema Assessment Assessed: [Left: No] [Right: No] E[Left: dema] [Right: :] Calf Left: Right: Point of Measurement: 36 cm From Medial Instep 28.5 cm 28 cm Ankle Left: Right: Point of Measurement: 8 cm From Medial Instep 18 cm 18 cm Vascular Assessment Pulses: Posterior Tibial Palpable: [Left:No] [Right:No] Dorsalis Pedis Palpable: [Left:Yes] [Right:Yes] Doppler: [Left:Monophasic] [Right:Monophasic] Extremity colors, hair growth, and conditions: Extremity Color: [Left:Normal] [Right:Normal] Hair Growth on Extremity: [Left:No] [Right:No] Temperature of Extremity: [Left:Warm]  [Right:Warm] Capillary Refill: [Left:< 3 seconds] [Right:< 3 seconds] Toe Nail Assessment Left: Right: Thick: No No Discolored: No No Deformed: No No Improper Length and Hygiene: No No Notes Non compressible. very thready pulses Electronic Signature(s) [name], [name] ([id number]) Signed: 02/27/2015 2:59:58 PM By: Elpidio Eric BSN, RN Entered By: Elpidio Eric on 02/27/2015 14:59:58 [name], [name] ([id number]) -------------------------------------------------------------------------------- Multi Wound Chart Details Patient Name: [name] Date of Service: 02/27/2015 2:30 PM Medical Record Number: [id number] Patient Account Number: [id number] Date of Birth/Sex: [birth year] (81 y.o. Female) Treating RN: Clover Mealy, RN, BSN, [place] Sink Primary Care Physician: Loney Laurence Other Clinician: Referring Physician: Treating Physician/Extender: Rudene Re in Treatment: 0 Vital Signs Height(in): 64 Pulse(bpm):  86 Weight(lbs): Blood Pressure 116/51 (mmHg): Body Mass Index(BMI): Temperature(F): 98.5 Respiratory Rate 16 (breaths/min): Photos: [1:No Photos] [2:No Photos] [N/A:N/A] Wound Location: [1:Left Calcaneous] [2:Left Foot - Dorsal] [N/A:N/A] Wounding Event: [1:Pressure Injury] [2:Pressure Injury] [N/A:N/A] Primary Etiology: [1:Pressure Ulcer] [2:Pressure Ulcer] [N/A:N/A] Date Acquired: [1:01/30/2015] [2:01/30/2015] [N/A:N/A] Weeks of Treatment: [1:0] [2:0] [N/A:N/A] Wound Status: [1:Open] [2:Open] [N/A:N/A] Measurements L x W x D 3.5x3.5x0.1 [2:1x2x0.1] [N/A:N/A] (cm) Area (cm) : [1:9.621] [2:1.571] [N/A:N/A] Volume (cm) : [1:0.962] [2:0.157] [N/A:N/A] % Reduction in Area: [1:0.00%] [2:0.00%] [N/A:N/A] % Reduction in Volume: 0.00% [2:0.00%] [N/A:N/A] Classification: [1:Unstageable/Unclassified] [2:Category/Stage II] [N/A:N/A] Exudate Amount: [1:None Present] [2:Small] [N/A:N/A] Wound Margin: [1:Indistinct, nonvisible] [2:Indistinct, nonvisible]  [N/A:N/A] Granulation Amount: [1:None Present (0%)] [2:Medium (34-66%)] [N/A:N/A] Granulation Quality: [1:N/A] [2:Pink, Pale] [N/A:N/A] Necrotic Amount: [1:Large (67-100%)] [2:Medium (34-66%)] [N/A:N/A] Necrotic Tissue: [1:Eschar] [2:Adherent Slough] [N/A:N/A] Exposed Structures: [1:Fascia: No Fat: No Tendon: No Muscle: No Joint: No Bone: No Limited to Skin Breakdown] [2:Fascia: No Fat: No Tendon: No Muscle: No Joint: No Bone: No Limited to Skin Breakdown] [N/A:N/A] Epithelialization: [1:None] [2:None] [N/A:N/A] Periwound Skin Texture: [N/A:N/A] Edema: No Edema: No Excoriation: No Excoriation: No Induration: No Induration: No Callus: No Callus: No Crepitus: No Crepitus: No Fluctuance: No Fluctuance: No Friable: No Friable: No Rash: No Rash: No Scarring: No Scarring: No Periwound Skin Dry/Scaly: Yes Moist: Yes N/A Moisture: Maceration: No Dry/Scaly: Yes Moist: No Maceration: No Periwound Skin Color: Atrophie Blanche: No Atrophie Blanche: No N/A Cyanosis: No Cyanosis: No Ecchymosis: No Ecchymosis: No Erythema: No Erythema: No Hemosiderin Staining: No Hemosiderin Staining: No Mottled: No Mottled: No Pallor: No Pallor: No Rubor: No Rubor: No Temperature: No Abnormality No Abnormality N/A Tenderness on Yes Yes N/A Palpation: Wound Preparation: Ulcer Cleansing: Ulcer Cleansing: N/A Rinsed/Irrigated with Rinsed/Irrigated with Saline Saline Topical Anesthetic Topical Anesthetic Applied: Other: lidocaine Applied: Other: lidocaine 4% 4% Treatment Notes Electronic Signature(s) Signed: 02/27/2015 3:14:55 PM By: Elpidio Eric BSN, RN Entered By: Elpidio Eric on 02/27/2015 15:14:55 [name] ([id number]) -------------------------------------------------------------------------------- Multi-Disciplinary Care Plan Details Patient Name: [name] Date of Service: 02/27/2015 2:30 PM Medical Record Number: [id number] Patient Account Number:  [id number] Date of Birth/Sex: 1928-03-03 (81 y.o. Female) Treating RN: Afful, RN, BSN, [place] Sink Primary Care Physician: Loney Laurence Other Clinician: Referring Physician: Treating Physician/Extender: Rudene Re in Treatment: 0 Active Inactive Orientation to the Wound Care Program Nursing Diagnoses: Knowledge deficit related to the wound healing center program Goals: Patient/caregiver will verbalize understanding of the Wound Healing Center Program Date Initiated: 02/27/2015 Goal Status: Active Interventions: Provide education on orientation to the wound center Notes: Pressure Nursing Diagnoses: Knowledge deficit related to management of pressures ulcers Potential for impaired tissue integrity related to pressure, friction, moisture, and shear Goals: Patient will remain free from development of additional pressure ulcers Date Initiated: 02/27/2015 Goal Status: Active Patient will remain free of pressure ulcers Date Initiated: 02/27/2015 Goal Status: Active Patient/caregiver will verbalize risk factors for pressure ulcer development Date Initiated: 02/27/2015 Goal Status: Active Patient/caregiver will verbalize understanding of pressure ulcer management Date Initiated: 02/27/2015 Goal Status: Active Interventions: Assess: immobility, friction, shearing, incontinence upon admission and as needed Higashi, Chania ([id number]) Assess offloading mechanisms upon admission and as needed Assess potential for pressure ulcer upon admission and as needed Provide education on pressure ulcers Treatment Activities: Patient referred for home evaluation of offloading devices/mattresses : 02/27/2015 Patient referred for pressure reduction/relief devices : 02/27/2015 Notes: Wound/Skin Impairment Nursing Diagnoses: Impaired tissue integrity Knowledge deficit related to ulceration/compromised skin integrity Goals: Patient/caregiver will verbalize understanding of skin care  regimen Date Initiated: 02/27/2015 Goal Status: Active Ulcer/skin breakdown will have a volume reduction of 30% by week 4 Date Initiated: 02/27/2015 Goal Status: Active Ulcer/skin breakdown will have a volume reduction of 50% by week 8 Date Initiated: 02/27/2015 Goal Status: Active Ulcer/skin breakdown will have a volume reduction of 80% by week 12 Date Initiated: 02/27/2015 Goal Status: Active Ulcer/skin breakdown will heal within 14 weeks Date Initiated: 02/27/2015 Goal Status: Active Interventions: Assess patient/caregiver ability to obtain necessary supplies Assess patient/caregiver ability to perform ulcer/skin care regimen upon admission and as needed Assess ulceration(s) every visit Provide education on ulcer and skin care Treatment Activities: Skin care regimen initiated : 02/27/2015 Notes: Electronic Signature(s) [name], [name] ([id number]) Signed: 02/27/2015 3:14:28 PM By: Elpidio EricAfful, Rita BSN, RN Entered By: Elpidio EricAfful, Rita on 02/27/2015 15:14:27 [name], Gloria MutterBLOSSOM ([id number]) -------------------------------------------------------------------------------- Pain Assessment Details Patient Name: [name], [name] Date of Service: 02/27/2015 2:30 PM Medical Record Number: [id number] Patient Account Number: [id number] Date of Birth/Sex: November 19, 1927 (81 y.o. Female) Treating RN: Afful, RN, BSN, [place] Primary Care Physician: Loney LaurenceAMPBELL, MARGARET Other Clinician: Referring Physician: Treating Physician/Extender: Rudene ReBritto, Errol Weeks in Treatment: 0 Active Problems Location of Pain Severity and Description of Pain Patient Has Paino No Site Locations Pain Management and Medication Current Pain Management: Electronic Signature(s) Signed: 02/27/2015 2:33:15 PM By: Elpidio EricAfful, Rita BSN, RN Entered By: Elpidio EricAfful, Rita on 02/27/2015 14:33:14 [name], [name] ([id number]) -------------------------------------------------------------------------------- Patient/Caregiver Education  Details Patient Name: [name], [name] Date of Service: 02/27/2015 2:30 PM Medical Record Number: [id number] Patient Account Number: [id number] Date of Birth/Gender: November 19, 1927 (81 y.o. Female) Treating RN: Afful, RN, BSN, [place] Sinkita Primary Care Physician: Loney LaurenceAMPBELL, MARGARET Other Clinician: Referring Physician: Treating Physician/Extender: Rudene ReBritto, Errol Weeks in Treatment: 0 Education Assessment Education Provided To: Patient Education Topics Provided Pressure: Methods: Explain/Verbal Responses: State content correctly Welcome To The Wound Care Center: Methods: Explain/Verbal Responses: State content correctly Wound/Skin Impairment: Methods: Explain/Verbal Responses: State content correctly Electronic Signature(s) Signed: 02/27/2015 3:39:46 PM By: Elpidio EricAfful, Rita BSN, RN Entered By: Elpidio EricAfful, Rita on 02/27/2015 15:39:46 [name], Kaelene ([id number]) -------------------------------------------------------------------------------- Wound Assessment Details Patient Name: [name], Jennalee Date of Service: 02/27/2015 2:30 PM Medical Record Number: [id number] Patient Account Number: [id number] Date of Birth/Sex: November 19, 1927 (81 y.o. Female) Treating RN: Afful, RN, BSN, [place] Sinkita Primary Care Physician: Loney LaurenceAMPBELL, MARGARET Other Clinician: Referring Physician: Treating Physician/Extender: Rudene ReBritto, Errol Weeks in Treatment: 0 Wound Status Wound Number: 1 Primary Pressure Ulcer Etiology: Wound Location: Left Calcaneous Wound Open Wounding Event: Pressure Injury Status: Date Acquired: 01/30/2015 Comorbid Anemia, Hypertension, Type II Weeks Of Treatment: 0 History: Diabetes, History of pressure wounds, Clustered Wound: No Osteoarthritis, Dementia Photos Wound Measurements Length: (cm) 3.5 Width: (cm) 3.5 Depth: (cm) 0.1 Area: (cm) 9.621 Volume: (cm) 0.962 % Reduction in Area: 0% % Reduction in Volume: 0% Epithelialization: None Tunneling: No Undermining: No Wound  Description Classification: Unstageable/Unclassified Foul Od Diabetic Severity (Wagner): Grade 1 Wound Margin: Indistinct, nonvisible Exudate Amount: None Present or After Cleansing: No Wound Bed Granulation Amount: None Present (0%) Exposed Structure Necrotic Amount: Large (67-100%) Fascia Exposed: No Necrotic Quality: Eschar Fat Layer Exposed: No Tendon Exposed: No Muscle Exposed: No Joint Exposed: No Zenz, Sua ([id number]) Bone Exposed: No Limited to Skin Breakdown Periwound Skin Texture Texture Color No Abnormalities Noted: No No Abnormalities Noted: No Callus: No Atrophie Blanche: No Crepitus: No Cyanosis: No Excoriation: No Ecchymosis: No Fluctuance: No Erythema: No Friable: No Hemosiderin Staining: No Induration: No Mottled: No Localized Edema: No Pallor: No Rash: No Rubor: No Scarring: No Temperature / Pain Moisture Temperature: No Abnormality No Abnormalities Noted:  No Tenderness on Palpation: Yes Dry / Scaly: Yes Maceration: No Moist: No Wound Preparation Ulcer Cleansing: Rinsed/Irrigated with Saline Topical Anesthetic Applied: Other: lidocaine 4%, Treatment Notes Wound #1 (Left Calcaneous) 1. Cleansed with: Clean wound with Normal Saline 4. Dressing Applied: Other dressing (specify in notes) 5. Secondary Dressing Applied Bordered Foam Dressing 6. Footwear/Offloading device applied Other footwear/offloading device applied (specify in notes) Notes SAge boots Electronic Signature(s) Signed: 02/28/2015 9:23:09 AM By: Elpidio Eric BSN, RN Previous Signature: 02/27/2015 2:40:38 PM Version By: Elpidio Eric BSN, RN Entered By: Elpidio Eric on 02/28/2015 09:23:09 [name] ([id number]) -------------------------------------------------------------------------------- Wound Assessment Details Patient Name: [name] Date of Service: 02/27/2015 2:30 PM Medical Record Number: [id number] Patient Account Number: [id number] Date of  Birth/Sex: 05-02-1928 (81 y.o. Female) Treating RN: Afful, RN, BSN, [place] Sink Primary Care Physician: Loney Laurence Other Clinician: Referring Physician: Treating Physician/Extender: Rudene Re in Treatment: 0 Wound Status Wound Number: 2 Primary Pressure Ulcer Etiology: Wound Location: Left Foot - Dorsal Wound Open Wounding Event: Pressure Injury Status: Date Acquired: 01/30/2015 Comorbid Anemia, Hypertension, Type II Weeks Of Treatment: 0 History: Diabetes, History of pressure wounds, Clustered Wound: No Osteoarthritis, Dementia Photos Wound Measurements Length: (cm) 1 Width: (cm) 2 Depth: (cm) 0.1 Area: (cm) 1.571 Volume: (cm) 0.157 % Reduction in Area: 0% % Reduction in Volume: 0% Epithelialization: None Tunneling: No Undermining: No Wound Description Classification: Category/Stage II Diabetic Severity (Wagner): Grade 1 Wound Margin: Indistinct, nonvisib Exudate Amount: Small Foul Odor After Cleansing: No le Wound Bed Granulation Amount: Medium (34-66%) Exposed Structure Granulation Quality: Pink, Pale Fascia Exposed: No Necrotic Amount: Medium (34-66%) Fat Layer Exposed: No Necrotic Quality: Adherent Slough Tendon Exposed: No Muscle Exposed: No Joint Exposed: No Auletta, Beverely ([id number]) Bone Exposed: No Limited to Skin Breakdown Periwound Skin Texture Texture Color No Abnormalities Noted: No No Abnormalities Noted: No Callus: No Atrophie Blanche: No Crepitus: No Cyanosis: No Excoriation: No Ecchymosis: No Fluctuance: No Erythema: No Friable: No Hemosiderin Staining: No Induration: No Mottled: No Localized Edema: No Pallor: No Rash: No Rubor: No Scarring: No Temperature / Pain Moisture Temperature: No Abnormality No Abnormalities Noted: No Tenderness on Palpation: Yes Dry / Scaly: Yes Maceration: No Moist: Yes Wound Preparation Ulcer Cleansing: Rinsed/Irrigated with Saline Topical Anesthetic Applied: Other:  lidocaine 4%, Treatment Notes Wound #2 (Left, Dorsal Foot) 1. Cleansed with: Clean wound with Normal Saline 4. Dressing Applied: Other dressing (specify in notes) 5. Secondary Dressing Applied Bordered Foam Dressing 6. Footwear/Offloading device applied Other footwear/offloading device applied (specify in notes) Notes SAge boots Electronic Signature(s) Signed: 02/28/2015 9:24:11 AM By: Elpidio Eric BSN, RN Previous Signature: 02/27/2015 2:42:41 PM Version By: Elpidio Eric BSN, RN Entered By: Elpidio Eric on 02/28/2015 09:24:11 Beitler, [name] ([id number]) -------------------------------------------------------------------------------- Vitals Details Patient Name: [name] Date of Service: 02/27/2015 2:30 PM Medical Record Number: [id number] Patient Account Number: [id number] Date of Birth/Sex: Sep 23, 1927 (81 y.o. Female) Treating RN: Afful, RN, BSN, Rita Primary Care Physician: Loney Laurence Other Clinician: Referring Physician: Treating Physician/Extender: Rudene Re in Treatment: 0 Vital Signs Time Taken: 14:36 Temperature (F): 98.5 Height (in): 64 Pulse (bpm): 86 Source: Stated Respiratory Rate (breaths/min): 16 Blood Pressure (mmHg): 116/51 Reference Range: 80 - 120 mg / dl Electronic Signature(s) Signed: 02/27/2015 2:37:09 PM By: Elpidio Eric BSN, RN Entered By: Elpidio Eric on 02/27/2015 14:37:08

## 2015-03-07 ENCOUNTER — Encounter: Payer: Medicare Other | Admitting: Surgery

## 2015-03-07 DIAGNOSIS — L89521 Pressure ulcer of left ankle, stage 1: Secondary | ICD-10-CM | POA: Diagnosis not present

## 2015-03-08 NOTE — Progress Notes (Signed)
BRANDILYN, NANNINGA ([id number]) Visit Report for 03/07/2015 Arrival Information Details Patient Name: TABATHIA, KNOCHE Date of Service: 03/07/2015 8:00 AM Medical Record Number: [id number] Patient Account Number: [id number] Date of Birth/Sex: 1927/08/02 (81 y.o. Female) Treating RN: Huel Coventry Primary Care Physician: Loney Laurence Other Clinician: Referring Physician: Loney Laurence Treating Physician/Extender: Rudene Re in Treatment: 1 Visit Information History Since Last Visit Added or deleted any medications: No Patient Arrived: Wheel Chair Any new allergies or adverse reactions: No Arrival Time: 08:02 Had a fall or experienced change in No Accompanied By: transportation activities of daily living that may affect Transfer Assistance: Manual risk of falls: Patient Identification Verified: Yes Signs or symptoms of abuse/neglect since last No Secondary Verification Process Yes visito Completed: Hospitalized since last visit: No Patient Requires Transmission- No Pain Present Now: No Based Precautions: Patient Has Alerts: Yes Patient Alerts: Patient on Blood Thinner ABI: Non Compressible Electronic Signature(s) Signed: 03/07/2015 3:54:28 PM By: Elliot Gurney, RN, BSN, Kim RN, BSN Entered By: Elliot Gurney, RN, BSN, Kim on 03/07/2015 08:09:39 [place], [name] ([id number]) -------------------------------------------------------------------------------- Clinic Level of Care Assessment Details Patient Name: [name] Date of Service: 03/07/2015 8:00 AM Medical Record Number: [id number] Patient Account Number: [id number] Date of Birth/Sex: 08-Jun-1927 (81 y.o. Female) Treating RN: Huel Coventry Primary Care Physician: Loney Laurence Other Clinician: Referring Physician: Loney Laurence Treating Physician/Extender: Rudene Re in Treatment: 1 Clinic Level of Care Assessment Items TOOL 4 Quantity Score []  - Use when only an EandM is performed on  FOLLOW-UP visit 0 ASSESSMENTS - Nursing Assessment / Reassessment []  - Reassessment of Co-morbidities (includes updates in patient status) 0 X - Reassessment of Adherence to Treatment Plan 1 5 ASSESSMENTS - Wound and Skin Assessment / Reassessment []  - Simple Wound Assessment / Reassessment - one wound 0 X - Complex Wound Assessment / Reassessment - multiple wounds 2 5 []  - Dermatologic / Skin Assessment (not related to wound area) 0 ASSESSMENTS - Focused Assessment []  - Circumferential Edema Measurements - multi extremities 0 []  - Nutritional Assessment / Counseling / Intervention 0 []  - Lower Extremity Assessment (monofilament, tuning fork, pulses) 0 []  - Peripheral Arterial Disease Assessment (using hand held doppler) 0 ASSESSMENTS - Ostomy and/or Continence Assessment and Care []  - Incontinence Assessment and Management 0 []  - Ostomy Care Assessment and Management (repouching, etc.) 0 PROCESS - Coordination of Care X - Simple Patient / Family Education for ongoing care 1 15 []  - Complex (extensive) Patient / Family Education for ongoing care 0 X - Staff obtains Chiropractor, Records, Test Results / Process Orders 1 10 []  - Staff telephones HHA, Nursing Homes / Clarify orders / etc 0 []  - Routine Transfer to another Facility (non-emergent condition) 0 Jenson, Laquinta ([id number]) []  - Routine Hospital Admission (non-emergent condition) 0 []  - New Admissions / Manufacturing engineer / Ordering NPWT, Apligraf, etc. 0 []  - Emergency Hospital Admission (emergent condition) 0 X - Simple Discharge Coordination 1 10 []  - Complex (extensive) Discharge Coordination 0 PROCESS - Special Needs []  - Pediatric / Minor Patient Management 0 []  - Isolation Patient Management 0 []  - Hearing / Language / Visual special needs 0 []  - Assessment of Community assistance (transportation, D/C planning, etc.) 0 []  - Additional assistance / Altered mentation 0 []  - Support Surface(s) Assessment (bed,  cushion, seat, etc.) 0 INTERVENTIONS - Wound Cleansing / Measurement []  - Simple Wound Cleansing - one wound 0 X - Complex Wound Cleansing - multiple wounds 2 5 X - Wound Imaging (photographs - any number of wounds)  1 5 []  - Wound Tracing (instead of photographs) 0 []  - Simple Wound Measurement - one wound 0 X - Complex Wound Measurement - multiple wounds 2 5 INTERVENTIONS - Wound Dressings []  - Small Wound Dressing one or multiple wounds 0 X - Medium Wound Dressing one or multiple wounds 2 15 []  - Large Wound Dressing one or multiple wounds 0 []  - Application of Medications - topical 0 []  - Application of Medications - injection 0 INTERVENTIONS - Miscellaneous []  - External ear exam 0 Scatena, Pauletta ([id number]) []  - Specimen Collection (cultures, biopsies, blood, body fluids, etc.) 0 []  - Specimen(s) / Culture(s) sent or taken to Lab for analysis 0 []  - Patient Transfer (multiple staff / Michiel Sites Lift / Similar devices) 0 []  - Simple Staple / Suture removal (25 or less) 0 []  - Complex Staple / Suture removal (26 or more) 0 []  - Hypo / Hyperglycemic Management (close monitor of Blood Glucose) 0 []  - Ankle / Brachial Index (ABI) - do not check if billed separately 0 X - Vital Signs 1 5 Has the patient been seen at the hospital within the last three years: Yes Total Score: 110 Level Of Care: New/Established - Level 3 Electronic Signature(s) Signed: 03/07/2015 3:54:28 PM By: Elliot Gurney, RN, BSN, Kim RN, BSN Entered By: Elliot Gurney, RN, BSN, Kim on 03/07/2015 08:31:03 [name] ([id number]) -------------------------------------------------------------------------------- Encounter Discharge Information Details Patient Name: [name] Date of Service: 03/07/2015 8:00 AM Medical Record Number: [id number] Patient Account Number: [id number] Date of Birth/Sex: 06-Apr-1928 (81 y.o. Female) Treating RN: Huel Coventry Primary Care Physician: Loney Laurence Other Clinician: Referring  Physician: Loney Laurence Treating Physician/Extender: Rudene Re in Treatment: 1 Encounter Discharge Information Items Schedule Follow-up Appointment: No Medication Reconciliation completed and provided to Patient/Care No Karmela Bram: Provided on Clinical Summary of Care: 03/07/2015 Form Type Recipient Paper Patient BM Electronic Signature(s) Signed: 03/07/2015 8:40:28 AM By: Gwenlyn Perking Entered By: Gwenlyn Perking on 03/07/2015 08:40:28 Marney, Lashya ([id number]) -------------------------------------------------------------------------------- Lower Extremity Assessment Details Patient Name: [name] Date of Service: 03/07/2015 8:00 AM Medical Record Number: [id number] Patient Account Number: [id number] Date of Birth/Sex: [birth year] (81 y.o. Female) Treating RN: Huel Coventry Primary Care Physician: Loney Laurence Other Clinician: Referring Physician: Loney Laurence Treating Physician/Extender: Rudene Re in Treatment: 1 Edema Assessment Assessed: [Left: No] [Right: No] Edema: [Left: N] [Right: o] Vascular Assessment Pulses: Posterior Tibial Dorsalis Pedis Palpable: [Left:Yes] Extremity colors, hair growth, and conditions: Extremity Color: [Left:Normal] Hair Growth on Extremity: [Left:No] Temperature of Extremity: [Left:Warm] Capillary Refill: [Left:< 3 seconds] Toe Nail Assessment Left: Right: Thick: No Discolored: No Deformed: No Improper Length and Hygiene: No Electronic Signature(s) Signed: 03/07/2015 3:54:28 PM By: Elliot Gurney, RN, BSN, Kim RN, BSN Entered By: Elliot Gurney, RN, BSN, Kim on 03/07/2015 08:15:05 [place]f, [name] ([id number]) -------------------------------------------------------------------------------- Multi Wound Chart Details Patient Name: [name] Date of Service: 03/07/2015 8:00 AM Medical Record Number: [id number] Patient Account Number: [id number] Date of Birth/Sex: 1928/01/26 (81 y.o. Female) Treating  RN: Huel Coventry Primary Care Physician: Loney Laurence Other Clinician: Referring Physician: Loney Laurence Treating Physician/Extender: Rudene Re in Treatment: 1 Vital Signs Height(in): 64 Pulse(bpm): 88 Weight(lbs): Blood Pressure 136/68 (mmHg): Body Mass Index(BMI): Temperature(F): 98.7 Respiratory Rate 16 (breaths/min): Photos: [1:No Photos] [2:No Photos] [N/A:N/A] Wound Location: [1:Left Calcaneous] [2:Left Foot - Dorsal] [N/A:N/A] Wounding Event: [1:Pressure Injury] [2:Pressure Injury] [N/A:N/A] Primary Etiology: [1:Pressure Ulcer] [2:Pressure Ulcer] [N/A:N/A] Comorbid History: [1:Anemia, Hypertension, Type II Diabetes, History of pressure wounds, Osteoarthritis, Dementia] [2:Anemia, Hypertension, Type II Diabetes, History of pressure wounds, Osteoarthritis,  Dementia] [N/A:N/A] Date Acquired: [1:01/30/2015] [2:01/30/2015] [N/A:N/A] Weeks of Treatment: [1:1] [2:1] [N/A:N/A] Wound Status: [1:Open] [2:Open] [N/A:N/A] Measurements L x W x D 4x5.5x0.1 [2:0.5x0.7x0.1] [N/A:N/A] (cm) Area (cm) : [1:17.279] [2:0.275] [N/A:N/A] Volume (cm) : [1:1.728] [2:0.027] [N/A:N/A] % Reduction in Area: [1:-79.60%] [2:82.50%] [N/A:N/A] % Reduction in Volume: -79.60% [2:82.80%] [N/A:N/A] Classification: [1:Unstageable/Unclassified] [2:Category/Stage II] [N/A:N/A] HBO Classification: [1:Grade 1] [2:Grade 1] [N/A:N/A] Exudate Amount: [1:None Present] [2:Small] [N/A:N/A] Wound Margin: [1:Indistinct, nonvisible] [2:Indistinct, nonvisible] [N/A:N/A] Granulation Amount: [1:Small (1-33%)] [2:Medium (34-66%)] [N/A:N/A] Granulation Quality: [1:Pink] [2:Pink, Pale] [N/A:N/A] Necrotic Amount: [1:Large (67-100%)] [2:Medium (34-66%)] [N/A:N/A] Necrotic Tissue: [1:Eschar] [2:Adherent Slough] [N/A:N/A] Exposed Structures: [1:Fascia: No Fat: No Tendon: No Muscle: No Joint: No] [2:Fascia: No Fat: No Tendon: No Muscle: No Joint: No] [N/A:N/A] Bone: No Bone: No Limited to Skin Limited to  Skin Breakdown Breakdown Epithelialization: None None N/A Periwound Skin Texture: Edema: No Edema: No N/A Excoriation: No Excoriation: No Induration: No Induration: No Callus: No Callus: No Crepitus: No Crepitus: No Fluctuance: No Fluctuance: No Friable: No Friable: No Rash: No Rash: No Scarring: No Scarring: No Periwound Skin Dry/Scaly: Yes Dry/Scaly: Yes N/A Moisture: Maceration: No Maceration: No Moist: No Moist: No Periwound Skin Color: Atrophie Blanche: No Atrophie Blanche: No N/A Cyanosis: No Cyanosis: No Ecchymosis: No Ecchymosis: No Erythema: No Erythema: No Hemosiderin Staining: No Hemosiderin Staining: No Mottled: No Mottled: No Pallor: No Pallor: No Rubor: No Rubor: No Temperature: No Abnormality No Abnormality N/A Tenderness on Yes Yes N/A Palpation: Wound Preparation: Ulcer Cleansing: Ulcer Cleansing: N/A Rinsed/Irrigated with Rinsed/Irrigated with Saline Saline Topical Anesthetic Topical Anesthetic Applied: Other: lidocaine Applied: Other: lidocaine 4% 4% Treatment Notes Electronic Signature(s) Signed: 03/07/2015 3:54:28 PM By: Elliot Gurney, RN, BSN, Kim RN, BSN Entered By: Elliot Gurney, RN, BSN, Kim on 03/07/2015 08:23:21 CLARITA, MCELVAIN ([id number]) -------------------------------------------------------------------------------- Multi-Disciplinary Care Plan Details Patient Name: [name] Date of Service: 03/07/2015 8:00 AM Medical Record Number: [id number] Patient Account Number: [id number] Date of Birth/Sex: [birth year] (81 y.o. Female) Treating RN: Huel Coventry Primary Care Physician: Loney Laurence Other Clinician: Referring Physician: Loney Laurence Treating Physician/Extender: Rudene Re in Treatment: 1 Active Inactive Orientation to the Wound Care Program Nursing Diagnoses: Knowledge deficit related to the wound healing center program Goals: Patient/caregiver will verbalize understanding of the Wound Healing  Center Program Date Initiated: 02/27/2015 Goal Status: Active Interventions: Provide education on orientation to the wound center Notes: Pressure Nursing Diagnoses: Knowledge deficit related to management of pressures ulcers Potential for impaired tissue integrity related to pressure, friction, moisture, and shear Goals: Patient will remain free from development of additional pressure ulcers Date Initiated: 02/27/2015 Goal Status: Active Patient will remain free of pressure ulcers Date Initiated: 02/27/2015 Goal Status: Active Patient/caregiver will verbalize risk factors for pressure ulcer development Date Initiated: 02/27/2015 Goal Status: Active Patient/caregiver will verbalize understanding of pressure ulcer management Date Initiated: 02/27/2015 Goal Status: Active Interventions: Assess: immobility, friction, shearing, incontinence upon admission and as needed Arons, Lauralee ([id number]) Assess offloading mechanisms upon admission and as needed Assess potential for pressure ulcer upon admission and as needed Provide education on pressure ulcers Treatment Activities: Patient referred for home evaluation of offloading devices/mattresses : 03/07/2015 Patient referred for pressure reduction/relief devices : 03/07/2015 Notes: Wound/Skin Impairment Nursing Diagnoses: Impaired tissue integrity Knowledge deficit related to ulceration/compromised skin integrity Goals: Patient/caregiver will verbalize understanding of skin care regimen Date Initiated: 02/27/2015 Goal Status: Active Ulcer/skin breakdown will have a volume reduction of 30% by week 4 Date Initiated: 02/27/2015 Goal Status: Active Ulcer/skin breakdown will have a volume reduction of  50% by week 8 Date Initiated: 02/27/2015 Goal Status: Active Ulcer/skin breakdown will have a volume reduction of 80% by week 12 Date Initiated: 02/27/2015 Goal Status: Active Ulcer/skin breakdown will heal within 14 weeks Date  Initiated: 02/27/2015 Goal Status: Active Interventions: Assess patient/caregiver ability to obtain necessary supplies Assess patient/caregiver ability to perform ulcer/skin care regimen upon admission and as needed Assess ulceration(s) every visit Provide education on ulcer and skin care Treatment Activities: Skin care regimen initiated : 03/07/2015 Notes: Electronic Signature(s) AHMIRA, BOISSELLE ([id number]) Signed: 03/07/2015 3:54:28 PM By: Elliot Gurney, RN, BSN, Kim RN, BSN Entered By: Elliot Gurney, RN, BSN, Kim on 03/07/2015 08:20:32 [name] ([id number]) -------------------------------------------------------------------------------- Patient/Caregiver Education Details Patient Name: [name] Date of Service: 03/07/2015 8:00 AM Medical Record Number: [id number] Patient Account Number: [id number] Date of Birth/Gender: [birth year] (81 y.o. Female) Treating RN: Huel Coventry Primary Care Physician: Loney Laurence Other Clinician: Referring Physician: Loney Laurence Treating Physician/Extender: Rudene Re in Treatment: 1 Education Assessment Education Provided To: Patient Education Topics Provided Psychologist, prison and probation services) Signed: 03/07/2015 3:54:28 PM By: Elliot Gurney RN, BSN, Kim RN, BSN Entered By: Elliot Gurney, RN, BSN, Kim on 03/07/2015 08:40:55 Felipe, [name] ([id number]) -------------------------------------------------------------------------------- Wound Assessment Details Patient Name: [name] Date of Service: 03/07/2015 8:00 AM Medical Record Number: [id number] Patient Account Number: [id number] Date of Birth/Sex: 1928-05-02 (81 y.o. Female) Treating RN: Huel Coventry Primary Care Physician: Loney Laurence Other Clinician: Referring Physician: Loney Laurence Treating Physician/Extender: Rudene Re in Treatment: 1 Wound Status Wound Number: 1 Primary Pressure Ulcer Etiology: Wound Location: Left Calcaneous Wound Open Wounding  Event: Pressure Injury Status: Date Acquired: 01/30/2015 Comorbid Anemia, Hypertension, Type II Weeks Of Treatment: 1 History: Diabetes, History of pressure wounds, Clustered Wound: No Osteoarthritis, Dementia Photos Photo Uploaded By: Elliot Gurney, RN, BSN, Kim on 03/07/2015 09:15:07 Wound Measurements Length: (cm) 4 Width: (cm) 5.5 Depth: (cm) 0.1 Area: (cm) 17.279 Volume: (cm) 1.728 % Reduction in Area: -79.6% % Reduction in Volume: -79.6% Epithelialization: None Wound Description Classification: Unstageable/Unclassified Foul Od Diabetic Severity (Wagner): Grade 1 Wound Margin: Indistinct, nonvisible Exudate Amount: None Present or After Cleansing: No Wound Bed Granulation Amount: Small (1-33%) Exposed Structure Granulation Quality: Pink Fascia Exposed: No Necrotic Amount: Large (67-100%) Fat Layer Exposed: No Necrotic Quality: Eschar Tendon Exposed: No Muscle Exposed: No Wauters, Madasyn ([id number]) Joint Exposed: No Bone Exposed: No Limited to Skin Breakdown Periwound Skin Texture Texture Color No Abnormalities Noted: No No Abnormalities Noted: No Callus: No Atrophie Blanche: No Crepitus: No Cyanosis: No Excoriation: No Ecchymosis: No Fluctuance: No Erythema: No Friable: No Hemosiderin Staining: No Induration: No Mottled: No Localized Edema: No Pallor: No Rash: No Rubor: No Scarring: No Temperature / Pain Moisture Temperature: No Abnormality No Abnormalities Noted: No Tenderness on Palpation: Yes Dry / Scaly: Yes Maceration: No Moist: No Wound Preparation Ulcer Cleansing: Rinsed/Irrigated with Saline Topical Anesthetic Applied: Other: lidocaine 4%, Treatment Notes Wound #1 (Left Calcaneous) 1. Cleansed with: Clean wound with Normal Saline 2. Anesthetic Topical Lidocaine 4% cream to wound bed prior to debridement 4. Dressing Applied: Other dressing (specify in notes) Notes Siltec Sorbact; SAge boots Electronic Signature(s) Signed:  03/07/2015 3:54:28 PM By: Elliot Gurney, RN, BSN, Kim RN, BSN Entered By: Elliot Gurney, RN, BSN, Kim on 03/07/2015 08:18:17 Scherer, [name] ([id number]) -------------------------------------------------------------------------------- Wound Assessment Details Patient Name: [name] Date of Service: 03/07/2015 8:00 AM Medical Record Number: [id number] Patient Account Number: [id number] Date of Birth/Sex: [birth year] (81 y.o. Female) Treating RN: Huel Coventry Primary Care Physician: Loney Laurence Other Clinician: Referring Physician: Loney Laurence Treating Physician/Extender: Rudene Re  in Treatment: 1 Wound Status Wound Number: 2 Primary Pressure Ulcer Etiology: Wound Location: Left Foot - Dorsal Wound Open Wounding Event: Pressure Injury Status: Date Acquired: 01/30/2015 Comorbid Anemia, Hypertension, Type II Weeks Of Treatment: 1 History: Diabetes, History of pressure wounds, Clustered Wound: No Osteoarthritis, Dementia Photos Photo Uploaded By: Elliot GurneyWoody, RN, BSN, Kim on 03/07/2015 09:15:07 Wound Measurements Length: (cm) 0.5 Width: (cm) 0.7 Depth: (cm) 0.1 Area: (cm) 0.275 Volume: (cm) 0.027 % Reduction in Area: 82.5% % Reduction in Volume: 82.8% Epithelialization: None Wound Description Classification: Category/Stage II Diabetic Severity (Wagner): Grade 1 Wound Margin: Indistinct, nonvisib Exudate Amount: Small Foul Odor After Cleansing: No le Wound Bed Granulation Amount: Medium (34-66%) Exposed Structure Granulation Quality: Pink, Pale Fascia Exposed: No Necrotic Amount: Medium (34-66%) Fat Layer Exposed: No Necrotic Quality: Adherent Slough Tendon Exposed: No Muscle Exposed: No Lope, Nitya ([id number]) Joint Exposed: No Bone Exposed: No Limited to Skin Breakdown Periwound Skin Texture Texture Color No Abnormalities Noted: No No Abnormalities Noted: No Callus: No Atrophie Blanche: No Crepitus: No Cyanosis: No Excoriation:  No Ecchymosis: No Fluctuance: No Erythema: No Friable: No Hemosiderin Staining: No Induration: No Mottled: No Localized Edema: No Pallor: No Rash: No Rubor: No Scarring: No Temperature / Pain Moisture Temperature: No Abnormality No Abnormalities Noted: No Tenderness on Palpation: Yes Dry / Scaly: Yes Maceration: No Moist: No Wound Preparation Ulcer Cleansing: Rinsed/Irrigated with Saline Topical Anesthetic Applied: Other: lidocaine 4%, Treatment Notes Wound #2 (Left, Dorsal Foot) 1. Cleansed with: Clean wound with Normal Saline 2. Anesthetic Topical Lidocaine 4% cream to wound bed prior to debridement 4. Dressing Applied: Other dressing (specify in notes) 5. Secondary Dressing Applied Bordered Foam Dressing Notes Siltec, SAge boots Electronic Signature(s) Signed: 03/07/2015 3:54:28 PM By: Elliot GurneyWoody, RN, BSN, Kim RN, BSN Entered By: Elliot GurneyWoody, RN, BSN, Kim on 03/07/2015 08:20:20 Rosie FateMORDECAI, Sukaina ([id number]) -------------------------------------------------------------------------------- Vitals Details Patient Name: Rosie FateMORDECAI, Mark Date of Service: 03/07/2015 8:00 AM Medical Record Number: [id number] Patient Account Number: [id number] Date of Birth/Sex: April 27, 1928 (81 y.o. Female) Treating RN: Huel CoventryWoody, Kim Primary Care Physician: Loney LaurenceAMPBELL, MARGARET Other Clinician: Referring Physician: Loney LaurenceAMPBELL, MARGARET Treating Physician/Extender: Rudene ReBritto, Errol Weeks in Treatment: 1 Vital Signs Time Taken: 08:13 Temperature (F): 98.7 Height (in): 64 Pulse (bpm): 88 Respiratory Rate (breaths/min): 16 Blood Pressure (mmHg): 136/68 Reference Range: 80 - 120 mg / dl Electronic Signature(s) Signed: 03/07/2015 3:54:28 PM By: Elliot GurneyWoody, RN, BSN, Kim RN, BSN Entered By: Elliot GurneyWoody, RN, BSN, Kim on 03/07/2015 08:14:06

## 2015-03-08 NOTE — Progress Notes (Signed)
Gloria, [name] ([id number]) Visit Report for 03/07/2015 Chief Complaint Document Details Patient Name: [name], [name] 03/07/2015 8:00 Date of Service: AM Medical Record [id number] Number: Patient Account Number: [id number] 1927-09-12 (81 y.o. Treating RN: Huel Coventry Date of Birth/Sex: Female) Other Clinician: Primary Care Physician: CAMPBELL, MARGARET Treating Evlyn Kanner Referring Physician: Loney Laurence Physician/Extender: Tania Ade in Treatment: 1 Information Obtained from: Patient Chief Complaint Patient is at the clinic for treatment of an open pressure ulcer. Recently noticed to have a pressure injury to the left heel for about 2 weeks now. Electronic Signature(s) Signed: 03/07/2015 8:36:13 AM By: Evlyn Kanner MD, FACS Entered By: Evlyn Kanner on 03/07/2015 08:36:13 [name] ([id number]) -------------------------------------------------------------------------------- HPI Details Patient Name: [name], [name] 03/07/2015 8:00 Date of Service: AM Medical Record [id number] Number: Patient Account Number: [id number] 18-Sep-1927 (81 y.o. Treating RN: Huel Coventry Date of Birth/Sex: Female) Other Clinician: Primary Care Physician: CAMPBELL, MARGARET Treating Evlyn Kanner Referring Physician: Loney Laurence Physician/Extender: Weeks in Treatment: 1 History of Present Illness Location: left heel and dorsum of the foot Quality: Patient reports No Pain. Severity: Patient states wound (s) are getting better. Duration: Patient has had the wound for < 2 weeks prior to presenting for treatment Timing: Pain in wound is Intermittent (comes and goes Context: The wound appeared gradually over time Modifying Factors: Other treatment(s) tried include: local dressings and offloading Associated Signs and Symptoms: Patient reports having difficulty standing for long periods. HPI Description: 81 year old patient with past medical history of Alzheimer's dementia,  diabetes mellitus, hypertension and recent history of right hip arthroplasty in August 2016. Her past medical history is significant also for hypothyroidism, hypertension, urinary retention, dementia. Electronic Signature(s) Signed: 03/07/2015 8:36:17 AM By: Evlyn Kanner MD, FACS Entered By: Evlyn Kanner on 03/07/2015 08:36:17 [name] ([id number]) -------------------------------------------------------------------------------- Physical Exam Details Patient Name: [name], [name] 03/07/2015 8:00 Date of Service: AM Medical Record [id number] Number: Patient Account Number: [id number] 1927/05/28 (81 y.o. Treating RN: Huel Coventry Date of Birth/Sex: Female) Other Clinician: Primary Care Physician: CAMPBELL, MARGARET Treating Evlyn Kanner Referring Physician: Loney Laurence Physician/Extender: Weeks in Treatment: 1 Constitutional . Pulse regular. Respirations normal and unlabored. Afebrile. . Eyes Nonicteric. Reactive to light. Ears, Nose, Mouth, and Throat Lips, teeth, and gums WNL.Marland Kitchen Moist mucosa without lesions . Neck supple and nontender. No palpable supraclavicular or cervical adenopathy. Normal sized without goiter. Respiratory WNL. No retractions.. Cardiovascular Pedal Pulses WNL. No clubbing, cyanosis or edema. Lymphatic No adneopathy. No adenopathy. No adenopathy. Musculoskeletal Adexa without tenderness or enlargement.. Digits and nails w/o clubbing, cyanosis, infection, petechiae, ischemia, or inflammatory conditions.. Integumentary (Hair, Skin) No suspicious lesions. No crepitus or fluctuance. No peri-wound warmth or erythema. No masses.Marland Kitchen Psychiatric Judgement and insight Intact.. No evidence of depression, anxiety, or agitation.. Notes The dorsum of the left ankle is healing nicely and has minimal eschar. The left heel has significant dry eschar and we will leave this alone. Electronic Signature(s) Signed: 03/07/2015 8:37:12 AM By: Evlyn Kanner MD,  FACS Entered By: Evlyn Kanner on 03/07/2015 08:37:12 [name], [name] ([id number]) -------------------------------------------------------------------------------- Physician Orders Details Patient Name: [name], [name] 03/07/2015 8:00 Date of Service: AM Medical Record [id number] Number: Patient Account Number: [id number] 1927/08/26 (81 y.o. Treating RN: Huel Coventry Date of Birth/Sex: Female) Other Clinician: Primary Care Physician: CAMPBELL, MARGARET Treating Evlyn Kanner Referring Physician: Loney Laurence Physician/Extender: Tania Ade in Treatment: 1 Verbal / Phone Orders: Yes Clinician: Huel Coventry Read Back and Verified: Yes Diagnosis Coding Wound Cleansing Wound #1 Left Calcaneous o Cleanse wound with mild soap and water Wound #2 Left,Dorsal Foot o  Cleanse wound with mild soap and water Anesthetic Wound #1 Left Calcaneous o Topical Lidocaine 4% cream applied to wound bed prior to debridement Wound #2 Left,Dorsal Foot o Topical Lidocaine 4% cream applied to wound bed prior to debridement Primary Wound Dressing Wound #1 Left Calcaneous o Other: - Siltec Sorbact (Siltec sent with Patient transportation, if not available you may paint with betadine and cover with bordered foam dressing). Wound #2 Left,Dorsal Foot o Other: - Sorbact Secondary Dressing Wound #1 Left Calcaneous o Boardered Foam Dressing - heel protectors Wound #2 Left,Dorsal Foot o Boardered Foam Dressing Dressing Change Frequency Wound #1 Left Calcaneous o Dressing is to be changed Monday and Thursday. Wound #2 Left,Dorsal Foot [name], [name] ([id number]) o Dressing is to be changed Monday and Thursday. Follow-up Appointments Wound #1 Left Calcaneous o Return Appointment in 1 week. Wound #2 Left,Dorsal Foot o Return Appointment in 1 week. Off-Loading Wound #1 Left Calcaneous o Heel suspension boot to: - [employer] Wound #2 Left,Dorsal Foot o Heel suspension boot  to: - [employer] Additional Orders / Instructions Wound #1 Left Calcaneous o Increase protein intake. Wound #2 Left,Dorsal Foot o Increase protein intake. Home Health Wound #1 Left Calcaneous o Continue Home Health Visits - [facility]: (Siltec sent with Patient transportation, if not available you may paint with betadine and cover with bordered foam dressing). o Home Health Nurse may visit PRN to address patientos wound care needs. o FACE TO FACE ENCOUNTER: MEDICARE and MEDICAID PATIENTS: I certify that this patient is under my care and that I had a face-to-face encounter that meets the physician face-to-face encounter requirements with this patient on this date. The encounter with the patient was in whole or in part for the following MEDICAL CONDITION: (primary reason for Home Healthcare) MEDICAL NECESSITY: I certify, that based on my findings, NURSING services are a medically necessary home health service. HOME BOUND STATUS: I certify that my clinical findings support that this patient is homebound (i.e., Due to illness or injury, pt requires aid of supportive devices such as crutches, cane, wheelchairs, walkers, the use of special transportation or the assistance of another person to leave their place of residence. There is a normal inability to leave the home and doing so requires considerable and taxing effort. Other absences are for medical reasons / religious services and are infrequent or of short duration when for other reasons). o If current dressing causes regression in wound condition, may D/C ordered dressing product/s and apply Normal Saline Moist Dressing daily until next Wound Healing Center / Other MD appointment. Notify Wound Healing Center of regression in wound condition at [phone]. o Please direct any NON-WOUND related issues/requests for orders to patient's Primary Care Physician Wound #2 Left,Dorsal Foot o Continue Home Health Visits -  [name], [name] ([id number]) o Home Health Nurse may visit PRN to address patientos wound care needs. o FACE TO FACE ENCOUNTER: MEDICARE and MEDICAID PATIENTS: I certify that this patient is under my care and that I had a face-to-face encounter that meets the physician face-to-face encounter requirements with this patient on this date. The encounter with the patient was in whole or in part for the following MEDICAL CONDITION: (primary reason for Home Healthcare) MEDICAL NECESSITY: I certify, that based on my findings, NURSING services are a medically necessary home health service. HOME BOUND STATUS: I certify that my clinical findings support that this patient is homebound (i.e., Due to illness or injury, pt requires aid of supportive devices such as crutches, cane,  wheelchairs, walkers, the use of special transportation or the assistance of another person to leave their place of residence. There is a normal inability to leave the home and doing so requires considerable and taxing effort. Other absences are for medical reasons / religious services and are infrequent or of short duration when for other reasons). o If current dressing causes regression in wound condition, may D/C ordered dressing product/s and apply Normal Saline Moist Dressing daily until next Wound Healing Center / Other MD appointment. Notify Wound Healing Center of regression in wound condition at [phone]. o Please direct any NON-WOUND related issues/requests for orders to patient's Primary Care Physician Electronic Signature(s) Signed: 03/07/2015 3:54:28 PM By: Elliot Gurney RN, BSN, Kim RN, BSN Signed: 03/07/2015 4:17:13 PM By: Evlyn Kanner MD, FACS Entered By: Elliot Gurney RN, BSN, Kim on 03/07/2015 08:38:09 [place], [name] ([id number]) -------------------------------------------------------------------------------- Problem List Details Patient Name: [name], [name] 03/07/2015 8:00 Date of  Service: AM Medical Record [id number] Number: Patient Account Number: [id number] 07/03/1927 (81 y.o. Treating RN: Huel Coventry Date of Birth/Sex: Female) Other Clinician: Primary Care Physician: Loney Laurence Treating Evlyn Kanner Referring Physician: Loney Laurence Physician/Extender: Tania Ade in Treatment: 1 Active Problems ICD-10 Encounter Code Description Active Date Diagnosis E11.621 Type 2 diabetes mellitus with foot ulcer 02/27/2015 Yes L89.620 Pressure ulcer of left heel, unstageable 02/27/2015 Yes L89.521 Pressure ulcer of left ankle, stage 1 02/27/2015 Yes G30.9 Alzheimer's disease, unspecified 02/27/2015 Yes Inactive Problems Resolved Problems Electronic Signature(s) Signed: 03/07/2015 8:36:07 AM By: Evlyn Kanner MD, FACS Entered By: Evlyn Kanner on 03/07/2015 08:36:07 [place], [name] ([id number]) -------------------------------------------------------------------------------- Progress Note Details Patient Name: [name], [name] 03/07/2015 8:00 Date of Service: AM Medical Record [id number] Number: Patient Account Number: [id number] [birth year] (81 y.o. Treating RN: Huel Coventry Date of Birth/Sex: Female) Other Clinician: Primary Care Physician: CAMPBELL, MARGARET Treating Evlyn Kanner Referring Physician: Loney Laurence Physician/Extender: Tania Ade in Treatment: 1 Subjective Chief Complaint Information obtained from Patient Patient is at the clinic for treatment of an open pressure ulcer. Recently noticed to have a pressure injury to the left heel for about 2 weeks now. History of Present Illness (HPI) The following HPI elements were documented for the patient's wound: Location: left heel and dorsum of the foot Quality: Patient reports No Pain. Severity: Patient states wound (s) are getting better. Duration: Patient has had the wound for < 2 weeks prior to presenting for treatment Timing: Pain in wound is Intermittent (comes and goes Context: The  wound appeared gradually over time Modifying Factors: Other treatment(s) tried include: local dressings and offloading Associated Signs and Symptoms: Patient reports having difficulty standing for long periods. 81 year old patient with past medical history of Alzheimer's dementia, diabetes mellitus, hypertension and recent history of right hip arthroplasty in August 2016. Her past medical history is significant also for hypothyroidism, hypertension, urinary retention, dementia. Objective Constitutional Pulse regular. Respirations normal and unlabored. Afebrile. Vitals Time Taken: 8:13 AM, Height: 64 in, Temperature: 98.7 F, Pulse: 88 bpm, Respiratory Rate: 16 breaths/min, Blood Pressure: 136/68 mmHg. Eyes Nonicteric. Reactive to light. [name], [name] ([id number]) Ears, Nose, Mouth, and Throat Lips, teeth, and gums WNL.Marland Kitchen Moist mucosa without lesions . Neck supple and nontender. No palpable supraclavicular or cervical adenopathy. Normal sized without goiter. Respiratory WNL. No retractions.. Cardiovascular Pedal Pulses WNL. No clubbing, cyanosis or edema. Lymphatic No adneopathy. No adenopathy. No adenopathy. Musculoskeletal Adexa without tenderness or enlargement.. Digits and nails w/o clubbing, cyanosis, infection, petechiae, ischemia, or inflammatory conditions.Marland Kitchen Psychiatric Judgement and insight Intact.. No evidence of depression, anxiety, or agitation.. General Notes: The  dorsum of the left ankle is healing nicely and has minimal eschar. The left heel has significant dry eschar and we will leave this alone. Integumentary (Hair, Skin) No suspicious lesions. No crepitus or fluctuance. No peri-wound warmth or erythema. No masses.. Wound #1 status is Open. Original cause of wound was Pressure Injury. The wound is located on the Left Calcaneous. The wound measures 4cm length x 5.5cm width x 0.1cm depth; 17.279cm^2 area and 1.728cm^3 volume. The wound is limited to skin breakdown.  There is a none present amount of drainage noted. The wound margin is indistinct and nonvisible. There is small (1-33%) pink granulation within the wound bed. There is a large (67-100%) amount of necrotic tissue within the wound bed including Eschar. The periwound skin appearance exhibited: Dry/Scaly. The periwound skin appearance did not exhibit: Callus, Crepitus, Excoriation, Fluctuance, Friable, Induration, Localized Edema, Rash, Scarring, Maceration, Moist, Atrophie Blanche, Cyanosis, Ecchymosis, Hemosiderin Staining, Mottled, Pallor, Rubor, Erythema. Periwound temperature was noted as No Abnormality. The periwound has tenderness on palpation. Wound #2 status is Open. Original cause of wound was Pressure Injury. The wound is located on the Left,Dorsal Foot. The wound measures 0.5cm length x 0.7cm width x 0.1cm depth; 0.275cm^2 area and 0.027cm^3 volume. The wound is limited to skin breakdown. There is a small amount of drainage noted. The wound margin is indistinct and nonvisible. There is medium (34-66%) pink, pale granulation within the wound bed. There is a medium (34-66%) amount of necrotic tissue within the wound bed including Adherent Slough. The periwound skin appearance exhibited: Dry/Scaly. The periwound skin appearance did not exhibit: Callus, Crepitus, Excoriation, Fluctuance, Friable, Induration, Localized Edema, Rash, Scarring, Maceration, Moist, Atrophie Blanche, Cyanosis, Ecchymosis, Hemosiderin Staining, Mottled, Pallor, Rubor, Erythema. Periwound temperature was noted as No Abnormality. The periwound has [name], [name] ([id number]) tenderness on palpation. Assessment Active Problems ICD-10 E11.621 - Type 2 diabetes mellitus with foot ulcer L89.620 - Pressure ulcer of left heel, unstageable L89.521 - Pressure ulcer of left ankle, stage 1 G30.9 - Alzheimer's disease, unspecified I have recommended some Sorbact with heel protectors and have discussed offloading stage  boots. We've also discussed with the nursing home attendant to give her an appropriate amount of nutrition, vitamin supplements and methods of offloading her heel. She will come back and see as an regular basis. Plan Wound Cleansing: Wound #1 Left Calcaneous: Cleanse wound with mild soap and water Wound #2 Left,Dorsal Foot: Cleanse wound with mild soap and water Anesthetic: Wound #1 Left Calcaneous: Topical Lidocaine 4% cream applied to wound bed prior to debridement Wound #2 Left,Dorsal Foot: Topical Lidocaine 4% cream applied to wound bed prior to debridement Primary Wound Dressing: Wound #2 Left,Dorsal Foot: Other: - Sorbact Wound #1 Left Calcaneous: Other: - [employer]tec Sorbact Secondary Dressing: Wound #1 Left Calcaneous: Boardered Foam Dressing - heel protectors Wound #2 Left,Dorsal Foot: Boardered Foam Dressing Dressing Change Frequency: Wound #1 Left Calcaneous: Dressing is to be changed Monday and Thursday. [name], [name] ([id number]) Wound #2 Left,Dorsal Foot: Dressing is to be changed Monday and Thursday. Follow-up Appointments: Wound #1 Left Calcaneous: Return Appointment in 1 week. Wound #2 Left,Dorsal Foot: Return Appointment in 1 week. Off-Loading: Wound #1 Left Calcaneous: Heel suspension boot to: - [employer] Wound #2 Left,Dorsal Foot: Heel suspension boot to: - [employer] Additional Orders / Instructions: Wound #1 Left Calcaneous: Increase protein intake. Wound #2 Left,Dorsal Foot: Increase protein intake. Home Health: Wound #1 Left Calcaneous: Continue Home Health Visits - [facility] Nurse may visit PRN to address patient  s wound care needs. FACE TO FACE ENCOUNTER: MEDICARE and MEDICAID PATIENTS: I certify that this patient is under my care and that I had a face-to-face encounter that meets the physician face-to-face encounter requirements with this patient on this date. The encounter with the patient was in whole or in part for  the following MEDICAL CONDITION: (primary reason for Home Healthcare) MEDICAL NECESSITY: I certify, that based on my findings, NURSING services are a medically necessary home health service. HOME BOUND STATUS: I certify that my clinical findings support that this patient is homebound (i.e., Due to illness or injury, pt requires aid of supportive devices such as crutches, cane, wheelchairs, walkers, the use of special transportation or the assistance of another person to leave their place of residence. There is a normal inability to leave the home and doing so requires considerable and taxing effort. Other absences are for medical reasons / religious services and are infrequent or of short duration when for other reasons). If current dressing causes regression in wound condition, may D/C ordered dressing product/s and apply Normal Saline Moist Dressing daily until next Wound Healing Center / Other MD appointment. Notify Wound Healing Center of regression in wound condition at [phone]. Please direct any NON-WOUND related issues/requests for orders to patient's Primary Care Physician Wound #2 Left,Dorsal Foot: Continue Home Health Visits - [facility] Nurse may visit PRN to address patient s wound care needs. FACE TO FACE ENCOUNTER: MEDICARE and MEDICAID PATIENTS: I certify that this patient is under my care and that I had a face-to-face encounter that meets the physician face-to-face encounter requirements with this patient on this date. The encounter with the patient was in whole or in part for the following MEDICAL CONDITION: (primary reason for Home Healthcare) MEDICAL NECESSITY: I certify, that based on my findings, NURSING services are a medically necessary home health service. HOME BOUND STATUS: I certify that my clinical findings support that this patient is homebound (i.e., Due to illness or injury, pt requires aid of supportive devices such as crutches, cane, wheelchairs,  walkers, the use of special transportation or the assistance of another person to leave their place of residence. There is a normal inability to leave the home and doing so requires considerable and taxing effort. Other absences are for medical reasons / religious services and are infrequent or of short duration when for other reasons). If current dressing causes regression in wound condition, may D/C ordered dressing product/s and apply Normal Saline Moist Dressing daily until next Wound Healing Center / Other MD appointment. Notify Wound Healing Center of regression in wound condition at [phone]. Grade, [name] ([id number]) Please direct any NON-WOUND related issues/requests for orders to patient's Primary Care Physician I have recommended some Sorbact with heel protectors and have discussed offloading stage boots. We've also discussed with the nursing home attendant to give her an appropriate amount of nutrition, vitamin supplements and methods of offloading her heel. She will come back and see as an regular basis. Electronic Signature(s) Signed: 03/07/2015 8:38:26 AM By: Evlyn Kanner MD, FACS Entered By: Evlyn Kanner on 03/07/2015 08:38:26 [place], [name] ([id number]) -------------------------------------------------------------------------------- SuperBill Details Patient Name: [name] Date of Service: 03/07/2015 Medical Record Number: [id number] Patient Account Number: [id number] Date of Birth/Sex: February 23, 1928 (81 y.o. Female) Treating RN: Huel Coventry Primary Care Physician: Loney Laurence Other Clinician: Referring Physician: Loney Laurence Treating Physician/Extender: Rudene Re in Treatment: 1 Diagnosis Coding ICD-10 Codes Code Description E11.621 Type 2 diabetes mellitus with foot ulcer L89.620 Pressure ulcer of  left heel, unstageable L89.521 Pressure ulcer of left ankle, stage 1 G30.9 Alzheimer's disease, unspecified Facility  Procedures CPT4 Code: [id number] Description: 99213 - WOUND CARE VISIT-LEV 3 EST PT Modifier: Quantity: 1 Physician Procedures CPT4 Code: [id number] Description: 99213 - WC PHYS LEVEL 3 - EST PT ICD-10 Description Diagnosis E11.621 Type 2 diabetes mellitus with foot ulcer L89.620 Pressure ulcer of left heel, unstageable L89.521 Pressure ulcer of left ankle, stage 1 G30.9 Alzheimer's disease,  unspecified Modifier: Quantity: 1 Electronic Signature(s) Signed: 03/07/2015 8:38:40 AM By: Evlyn KannerBritto, Keyan Folson MD, FACS Entered By: Evlyn KannerBritto, Ivin Rosenbloom on 03/07/2015 08:38:40

## 2015-03-14 ENCOUNTER — Encounter: Payer: Medicare Other | Attending: Surgery | Admitting: Surgery

## 2015-03-14 DIAGNOSIS — L89620 Pressure ulcer of left heel, unstageable: Secondary | ICD-10-CM | POA: Diagnosis not present

## 2015-03-14 DIAGNOSIS — I10 Essential (primary) hypertension: Secondary | ICD-10-CM | POA: Insufficient documentation

## 2015-03-14 DIAGNOSIS — G309 Alzheimer's disease, unspecified: Secondary | ICD-10-CM | POA: Insufficient documentation

## 2015-03-14 DIAGNOSIS — L89521 Pressure ulcer of left ankle, stage 1: Secondary | ICD-10-CM | POA: Insufficient documentation

## 2015-03-14 DIAGNOSIS — E11621 Type 2 diabetes mellitus with foot ulcer: Secondary | ICD-10-CM | POA: Insufficient documentation

## 2015-03-14 NOTE — Progress Notes (Addendum)
LAKEYN, DOKKEN ([id number]) Visit Report for 03/14/2015 Chief Complaint Document Details Patient Name: [name], [name] Date of Service: 03/14/2015 3:00 PM Medical Record Number: [id number] Patient Account Number: [id number] Date of Birth/Sex: 1928/04/18 (81 y.o. Female) Treating RN: Curtis Sites Primary Care Physician: Loney Laurence Other Clinician: Referring Physician: Loney Laurence Treating Physician/Extender: Rudene Re in Treatment: 2 Information Obtained from: Patient Chief Complaint Patient is at the clinic for treatment of an open pressure ulcer. Recently noticed to have a pressure injury to the left heel for about 2 weeks now. Electronic Signature(s) Signed: 03/14/2015 3:24:09 PM By: Evlyn Kanner MD, FACS Entered By: Evlyn Kanner on 03/14/2015 15:24:08 [name] ([id number]) -------------------------------------------------------------------------------- HPI Details Patient Name: [name] Date of Service: 03/14/2015 3:00 PM Medical Record Number: [id number] Patient Account Number: [id number] Date of Birth/Sex: [birth year] (81 y.o. Female) Treating RN: Curtis Sites Primary Care Physician: Loney Laurence Other Clinician: Referring Physician: Loney Laurence Treating Physician/Extender: Rudene Re in Treatment: 2 History of Present Illness Location: left heel and dorsum of the foot Quality: Patient reports No Pain. Severity: Patient states wound (s) are getting better. Duration: Patient has had the wound for < 2 weeks prior to presenting for treatment Timing: Pain in wound is Intermittent (comes and goes Context: The wound appeared gradually over time Modifying Factors: Other treatment(s) tried include: local dressings and offloading Associated Signs and Symptoms: Patient reports having difficulty standing for long periods. HPI Description: 81 year old patient with past medical history of Alzheimer's dementia, diabetes  mellitus, hypertension and recent history of right hip arthroplasty in August 2016. Her past medical history is significant also for hypothyroidism, hypertension, urinary retention, dementia. Electronic Signature(s) Signed: 03/14/2015 3:24:16 PM By: Evlyn Kanner MD, FACS Entered By: Evlyn Kanner on 03/14/2015 15:24:16 [name] ([id number]) -------------------------------------------------------------------------------- Physical Exam Details Patient Name: [name] Date of Service: 03/14/2015 3:00 PM Medical Record Number: [id number] Patient Account Number: [id number] Date of Birth/Sex: 06/26/1927 (81 y.o. Female) Treating RN: Curtis Sites Primary Care Physician: Loney Laurence Other Clinician: Referring Physician: Loney Laurence Treating Physician/Extender: Rudene Re in Treatment: 2 Constitutional . Pulse regular. Respirations normal and unlabored. Afebrile. . Eyes Nonicteric. Reactive to light. Ears, Nose, Mouth, and Throat Lips, teeth, and gums WNL.Marland Kitchen Moist mucosa without lesions . Neck supple and nontender. No palpable supraclavicular or cervical adenopathy. Normal sized without goiter. Respiratory WNL. No retractions.. Cardiovascular Pedal Pulses WNL. No clubbing, cyanosis or edema. Lymphatic No adneopathy. No adenopathy. No adenopathy. Musculoskeletal Adexa without tenderness or enlargement.. Digits and nails w/o clubbing, cyanosis, infection, petechiae, ischemia, or inflammatory conditions.. Integumentary (Hair, Skin) No suspicious lesions. No crepitus or fluctuance. No peri-wound warmth or erythema. No masses.Marland Kitchen Psychiatric Judgement and insight Intact.. No evidence of depression, anxiety, or agitation.. Notes the dorsum of the left ankle had minimal eschar which has been gently removed with moist saline gauze. The left heel has a dark eschar which is quite firmly adherent and surrounding this is some loose skin which was gently  removed. Electronic Signature(s) Signed: 03/14/2015 3:33:36 PM By: Evlyn Kanner MD, FACS Entered By: Evlyn Kanner on 03/14/2015 15:33:36 [name] ([id number]) -------------------------------------------------------------------------------- Physician Orders Details Patient Name: [name] Date of Service: 03/14/2015 3:00 PM Medical Record Number: [id number] Patient Account Number: [id number] Date of Birth/Sex: 1928-03-26 (81 y.o. Female) Treating RN: Curtis Sites Primary Care Physician: Loney Laurence Other Clinician: Referring Physician: Loney Laurence Treating Physician/Extender: Rudene Re in Treatment: 2 Verbal / Phone Orders: Yes Clinician: Curtis Sites Read Back and Verified: Yes Diagnosis Coding Wound Cleansing Wound #1  Left Calcaneous o Cleanse wound with mild soap and water Wound #2 Left,Dorsal Foot o Cleanse wound with mild soap and water Anesthetic Wound #1 Left Calcaneous o Topical Lidocaine 4% cream applied to wound bed prior to debridement Wound #2 Left,Dorsal Foot o Topical Lidocaine 4% cream applied to wound bed prior to debridement Primary Wound Dressing Wound #1 Left Calcaneous o Other: - paint with betadine Wound #2 Left,Dorsal Foot o Other: - Siltec Sorbact Secondary Dressing Wound #1 Left Calcaneous o Boardered Foam Dressing - heel protectors Dressing Change Frequency Wound #1 Left Calcaneous o Dressing is to be changed Monday and Thursday. Wound #2 Left,Dorsal Foot o Change dressing every week - at [facility] Follow-up Appointments Wound #1 Left Calcaneous o Return Appointment in 1 week. Splitt, Sharmila ([id number]) Wound #2 Left,Dorsal Foot o Return Appointment in 1 week. Off-Loading Wound #1 Left Calcaneous o Heel suspension boot to: - [employer] Wound #2 Left,Dorsal Foot o Heel suspension boot to: - [employer] Additional Orders / Instructions Wound #1 Left Calcaneous o Increase  protein intake. Wound #2 Left,Dorsal Foot o Increase protein intake. Home Health Wound #1 Left Calcaneous o Continue Home Health Visits - [place] o Home Health Nurse may visit PRN to address patientos wound care needs. o FACE TO FACE ENCOUNTER: MEDICARE and MEDICAID PATIENTS: I certify that this patient is under my care and that I had a face-to-face encounter that meets the physician face-to-face encounter requirements with this patient on this date. The encounter with the patient was in whole or in part for the following MEDICAL CONDITION: (primary reason for Home Healthcare) MEDICAL NECESSITY: I certify, that based on my findings, NURSING services are a medically necessary home health service. HOME BOUND STATUS: I certify that my clinical findings support that this patient is homebound (i.e., Due to illness or injury, pt requires aid of supportive devices such as crutches, cane, wheelchairs, walkers, the use of special transportation or the assistance of another person to leave their place of residence. There is a normal inability to leave the home and doing so requires considerable and taxing effort. Other absences are for medical reasons / religious services and are infrequent or of short duration when for other reasons). o If current dressing causes regression in wound condition, may D/C ordered dressing product/s and apply Normal Saline Moist Dressing daily until next Wound Healing Center / Other MD appointment. Notify Wound Healing Center of regression in wound condition at [phone]. o Please direct any NON-WOUND related issues/requests for orders to patient's Primary Care Physician Wound #2 Left,Dorsal Foot o Continue Home Health Visits - [facility] o Home Health Nurse may visit PRN to address patientos wound care needs. o FACE TO FACE ENCOUNTER: MEDICARE and MEDICAID PATIENTS: I certify that this patient is under my care and that I had a face-to-face  encounter that meets the physician face-to-face encounter requirements with this patient on this date. The encounter with the patient was in whole or in part for the following MEDICAL CONDITION: (primary reason for Home Healthcare) MEDICAL NECESSITY: I certify, that based on my findings, NURSING services are a medically Gutknecht, Sona ([id number]) necessary home health service. HOME BOUND STATUS: I certify that my clinical findings support that this patient is homebound (i.e., Due to illness or injury, pt requires aid of supportive devices such as crutches, cane, wheelchairs, walkers, the use of special transportation or the assistance of another person to leave their place of residence. There is a normal inability to leave the home and doing  so requires considerable and taxing effort. Other absences are for medical reasons / religious services and are infrequent or of short duration when for other reasons). o If current dressing causes regression in wound condition, may D/C ordered dressing product/s and apply Normal Saline Moist Dressing daily until next Wound Healing Center / Other MD appointment. Notify Wound Healing Center of regression in wound condition at [phone]. o Please direct any NON-WOUND related issues/requests for orders to patient's Primary Care Physician Electronic Signature(s) Signed: 03/14/2015 3:45:45 PM By: Evlyn KannerBritto, Lani Havlik MD, FACS Signed: 03/14/2015 4:46:28 PM By: Curtis Sitesorthy, Joanna Entered By: Curtis Sitesorthy, Joanna on 03/14/2015 15:35:50 [name], Gloria MutterBLOSSOM ([id number]) -------------------------------------------------------------------------------- Problem List Details Patient Name: Gloria FateMORDECAI, Zayley Date of Service: 03/14/2015 3:00 PM Medical Record Number: [id number] Patient Account Number: [id number] Date of Birth/Sex: Dec 16, 1927 (81 y.o. Female) Treating RN: Curtis Sitesorthy, Joanna Primary Care Physician: Loney LaurenceAMPBELL, MARGARET Other Clinician: Referring Physician: Loney LaurenceAMPBELL,  MARGARET Treating Physician/Extender: Rudene ReBritto, Jayani Rozman Weeks in Treatment: 2 Active Problems ICD-10 Encounter Code Description Active Date Diagnosis E11.621 Type 2 diabetes mellitus with foot ulcer 02/27/2015 Yes L89.620 Pressure ulcer of left heel, unstageable 02/27/2015 Yes L89.521 Pressure ulcer of left ankle, stage 1 02/27/2015 Yes G30.9 Alzheimer's disease, unspecified 02/27/2015 Yes Inactive Problems Resolved Problems Electronic Signature(s) Signed: 03/14/2015 3:23:55 PM By: Evlyn KannerBritto, Osama Coleson MD, FACS Entered By: Evlyn KannerBritto, Karsyn Jamie on 03/14/2015 15:23:55 Cargo, Gloria MutterBLOSSOM ([id number]) -------------------------------------------------------------------------------- Progress Note Details Patient Name: Gloria FateMORDECAI, Delsy Date of Service: 03/14/2015 3:00 PM Medical Record Number: [id number] Patient Account Number: [id number] Date of Birth/Sex: Dec 16, 1927 (81 y.o. Female) Treating RN: Curtis Sitesorthy, Joanna Primary Care Physician: Loney LaurenceAMPBELL, MARGARET Other Clinician: Referring Physician: Loney LaurenceAMPBELL, MARGARET Treating Physician/Extender: Rudene ReBritto, Danamarie Minami Weeks in Treatment: 2 Subjective Chief Complaint Information obtained from Patient Patient is at the clinic for treatment of an open pressure ulcer. Recently noticed to have a pressure injury to the left heel for about 2 weeks now. History of Present Illness (HPI) The following HPI elements were documented for the patient's wound: Location: left heel and dorsum of the foot Quality: Patient reports No Pain. Severity: Patient states wound (s) are getting better. Duration: Patient has had the wound for < 2 weeks prior to presenting for treatment Timing: Pain in wound is Intermittent (comes and goes Context: The wound appeared gradually over time Modifying Factors: Other treatment(s) tried include: local dressings and offloading Associated Signs and Symptoms: Patient reports having difficulty standing for long periods. 81 year old patient with past medical  history of Alzheimer's dementia, diabetes mellitus, hypertension and recent history of right hip arthroplasty in August 2016. Her past medical history is significant also for hypothyroidism, hypertension, urinary retention, dementia. Objective Constitutional Pulse regular. Respirations normal and unlabored. Afebrile. Vitals Time Taken: 3:12 PM, Height: 64 in, Temperature: 98.6 F, Pulse: 88 bpm, Respiratory Rate: 16 breaths/min, Blood Pressure: 131/46 mmHg. Eyes Nonicteric. Reactive to light. Ears, Nose, Mouth, and Throat Routon, Noami ([id number]) Lips, teeth, and gums WNL.Marland Kitchen. Moist mucosa without lesions . Neck supple and nontender. No palpable supraclavicular or cervical adenopathy. Normal sized without goiter. Respiratory WNL. No retractions.. Cardiovascular Pedal Pulses WNL. No clubbing, cyanosis or edema. Lymphatic No adneopathy. No adenopathy. No adenopathy. Musculoskeletal Adexa without tenderness or enlargement.. Digits and nails w/o clubbing, cyanosis, infection, petechiae, ischemia, or inflammatory conditions.Marland Kitchen. Psychiatric Judgement and insight Intact.. No evidence of depression, anxiety, or agitation.. General Notes: the dorsum of the left ankle had minimal eschar which has been gently removed with moist saline gauze. The left heel has a dark eschar which is quite firmly adherent and surrounding this is some loose skin  which was gently removed. Integumentary (Hair, Skin) No suspicious lesions. No crepitus or fluctuance. No peri-wound warmth or erythema. No masses.. Wound #1 status is Open. Original cause of wound was Pressure Injury. The wound is located on the Left Calcaneous. The wound measures 4.8cm length x 3.9cm width x 0.1cm depth; 14.703cm^2 area and 1.47cm^3 volume. The wound is limited to skin breakdown. There is no tunneling or undermining noted. There is a none present amount of drainage noted. The wound margin is indistinct and nonvisible. There is no  granulation within the wound bed. There is a large (67-100%) amount of necrotic tissue within the wound bed including Eschar. The periwound skin appearance exhibited: Dry/Scaly. The periwound skin appearance did not exhibit: Callus, Crepitus, Excoriation, Fluctuance, Friable, Induration, Localized Edema, Rash, Scarring, Maceration, Moist, Atrophie Blanche, Cyanosis, Ecchymosis, Hemosiderin Staining, Mottled, Pallor, Rubor, Erythema. Periwound temperature was noted as No Abnormality. The periwound has tenderness on palpation. Wound #2 status is Open. Original cause of wound was Pressure Injury. The wound is located on the Left,Dorsal Foot. The wound measures 0.7cm length x 0.7cm width x 0.1cm depth; 0.385cm^2 area and 0.038cm^3 volume. The wound is limited to skin breakdown. There is no tunneling or undermining noted. There is a small amount of drainage noted. The wound margin is indistinct and nonvisible. There is medium (34-66%) pink, pale granulation within the wound bed. There is a medium (34-66%) amount of necrotic tissue within the wound bed including Adherent Slough. The periwound skin appearance exhibited: Dry/Scaly. The periwound skin appearance did not exhibit: Callus, Crepitus, Excoriation, Fluctuance, Friable, Induration, Localized Edema, Rash, Scarring, Maceration, Moist, Atrophie Blanche, Cyanosis, Ecchymosis, Hemosiderin Staining, Mottled, Pallor, Rubor, Erythema. Periwound temperature was noted as No Abnormality. The periwound has tenderness on palpation. [name], [name] ([id number]) Assessment Active Problems ICD-10 E11.621 - Type 2 diabetes mellitus with foot ulcer L89.620 - Pressure ulcer of left heel, unstageable L89.521 - Pressure ulcer of left ankle, stage 1 G30.9 - Alzheimer's disease, unspecified Plan Wound Cleansing: Wound #1 Left Calcaneous: Cleanse wound with mild soap and water Wound #2 Left,Dorsal Foot: Cleanse wound with mild soap and  water Anesthetic: Wound #1 Left Calcaneous: Topical Lidocaine 4% cream applied to wound bed prior to debridement Wound #2 Left,Dorsal Foot: Topical Lidocaine 4% cream applied to wound bed prior to debridement Primary Wound Dressing: Wound #1 Left Calcaneous: Other: - paint with betadine Wound #2 Left,Dorsal Foot: Other: - [employer]tec Sorbact Secondary Dressing: Wound #1 Left Calcaneous: Boardered Foam Dressing - heel protectors Dressing Change Frequency: Wound #1 Left Calcaneous: Dressing is to be changed Monday and Thursday. Wound #2 Left,Dorsal Foot: Change dressing every week - at [facility] Follow-up Appointments: Wound #1 Left Calcaneous: Return Appointment in 1 week. Wound #2 Left,Dorsal Foot: Return Appointment in 1 week. Off-Loading: Wound #1 Left Calcaneous: Heel suspension boot to: [employer] Rosen[employer], Beyonce ([id number]) Wound #2 Left,Dorsal Foot: Heel suspension boot to: - [employer] Additional Orders / Instructions: Wound #1 Left Calcaneous: Increase protein intake. Wound #2 Left,Dorsal Foot: Increase protein intake. Home Health: Wound #1 Left Calcaneous: Continue Home Health Visits - [facility] Nurse may visit PRN to address patient s wound care needs. FACE TO FACE ENCOUNTER: MEDICARE and MEDICAID PATIENTS: I certify that this patient is under my care and that I had a face-to-face encounter that meets the physician face-to-face encounter requirements with this patient on this date. The encounter with the patient was in whole or in part for the following MEDICAL CONDITION: (primary reason for Home Healthcare) MEDICAL  NECESSITY: I certify, that based on my findings, NURSING services are a medically necessary home health service. HOME BOUND STATUS: I certify that my clinical findings support that this patient is homebound (i.e., Due to illness or injury, pt requires aid of supportive devices such as crutches, cane, wheelchairs, walkers, the use of special  transportation or the assistance of another person to leave their place of residence. There is a normal inability to leave the home and doing so requires considerable and taxing effort. Other absences are for medical reasons / religious services and are infrequent or of short duration when for other reasons). If current dressing causes regression in wound condition, may D/C ordered dressing product/s and apply Normal Saline Moist Dressing daily until next Wound Healing Center / Other MD appointment. Notify Wound Healing Center of regression in wound condition at [phone]. Please direct any NON-WOUND related issues/requests for orders to patient's Primary Care Physician Wound #2 Left,Dorsal Foot: Continue Home Health Visits - [facility] Nurse may visit PRN to address patient s wound care needs. FACE TO FACE ENCOUNTER: MEDICARE and MEDICAID PATIENTS: I certify that this patient is under my care and that I had a face-to-face encounter that meets the physician face-to-face encounter requirements with this patient on this date. The encounter with the patient was in whole or in part for the following MEDICAL CONDITION: (primary reason for Home Healthcare) MEDICAL NECESSITY: I certify, that based on my findings, NURSING services are a medically necessary home health service. HOME BOUND STATUS: I certify that my clinical findings support that this patient is homebound (i.e., Due to illness or injury, pt requires aid of supportive devices such as crutches, cane, wheelchairs, walkers, the use of special transportation or the assistance of another person to leave their place of residence. There is a normal inability to leave the home and doing so requires considerable and taxing effort. Other absences are for medical reasons / religious services and are infrequent or of short duration when for other reasons). If current dressing causes regression in wound condition, may D/C ordered dressing  product/s and apply Normal Saline Moist Dressing daily until next Wound Healing Center / Other MD appointment. Notify Wound Healing Center of regression in wound condition at [phone]. Please direct any NON-WOUND related issues/requests for orders to patient's Primary Care Physician We will continue to use salt backed over the left ankle and a heel protector on the right heel after painting it with Betadine. Smejkal, Raidyn ([id number]) She is encouraged to continue with good nutrition, vitamins and off loading especially with the Sage boot at night. She will continue to see is on a weekly basis. Electronic Signature(s) Signed: 03/14/2015 3:49:52 PM By: Evlyn Kanner MD, FACS Previous Signature: 03/14/2015 3:34:42 PM Version By: Evlyn Kanner MD, FACS Entered By: Evlyn Kanner on 03/14/2015 15:49:52 Moon, [name] ([id number]) -------------------------------------------------------------------------------- SuperBill Details Patient Name: [name] Date of Service: 03/14/2015 Medical Record Number: [id number] Patient Account Number: [id number] Date of Birth/Sex: April 14, 1928 (81 y.o. Female) Treating RN: Curtis Sites Primary Care Physician: Loney Laurence Other Clinician: Referring Physician: Loney Laurence Treating Physician/Extender: Rudene Re in Treatment: 2 Diagnosis Coding ICD-10 Codes Code Description E11.621 Type 2 diabetes mellitus with foot ulcer L89.620 Pressure ulcer of left heel, unstageable L89.521 Pressure ulcer of left ankle, stage 1 G30.9 Alzheimer's disease, unspecified Physician Procedures CPT4 Code: [id number] Description: 99213 - WC PHYS LEVEL 3 - EST PT ICD-10 Description Diagnosis E11.621 Type 2 diabetes mellitus with foot ulcer L89.620 Pressure ulcer of left heel,  unstageable L89.521 Pressure ulcer of left ankle, stage 1 G30.9 Alzheimer's disease,  unspecified Modifier: Quantity: 1 Electronic Signature(s) Signed: 03/14/2015 3:34:56  PM By: Evlyn Kanner MD, FACS Entered By: Evlyn Kanner on 03/14/2015 15:34:56

## 2015-03-15 NOTE — Progress Notes (Signed)
NEVADA, KIRCHNER ([id number]) Visit Report for 03/14/2015 Arrival Information Details Patient Name: [name], [name] Date of Service: 03/14/2015 3:00 PM Medical Record Number: [id number] Patient Account Number: [id number] Date of Birth/Sex: 1927-07-26 (81 y.o. Female) Treating RN: Curtis Sites Primary Care Physician: Loney Laurence Other Clinician: Referring Physician: Loney Laurence Treating Physician/Extender: Rudene Re in Treatment: 2 Visit Information History Since Last Visit Added or deleted any medications: No Patient Arrived: Wheel Chair Any new allergies or adverse reactions: No Arrival Time: 15:10 Had a fall or experienced change in No Accompanied By: staff activities of daily living that may affect Transfer Assistance: None risk of falls: Patient Identification Verified: Yes Signs or symptoms of abuse/neglect since last No Secondary Verification Process Yes visito Completed: Hospitalized since last visit: No Patient Requires Transmission- No Pain Present Now: No Based Precautions: Patient Has Alerts: Yes Patient Alerts: Patient on Blood Thinner ABI: Non Compressible Electronic Signature(s) Signed: 03/14/2015 4:46:28 PM By: Curtis Sites Entered By: Curtis Sites on 03/14/2015 15:11:51 Mcclure, [name] ([id number]) -------------------------------------------------------------------------------- Encounter Discharge Information Details Patient Name: [name] Date of Service: 03/14/2015 3:00 PM Medical Record Number: [id number] Patient Account Number: [id number] Date of Birth/Sex: 31-Aug-1927 (81 y.o. Female) Treating RN: Curtis Sites Primary Care Physician: Loney Laurence Other Clinician: Referring Physician: Loney Laurence Treating Physician/Extender: Rudene Re in Treatment: 2 Encounter Discharge Information Items Schedule Follow-up Appointment: No Medication Reconciliation completed No and provided to  Patient/Care Zori Benbrook: Provided on Clinical Summary of Care: 03/14/2015 Form Type Recipient Paper Patient BM Electronic Signature(s) Signed: 03/14/2015 3:42:28 PM By: Gwenlyn Perking Entered By: Gwenlyn Perking on 03/14/2015 15:42:28 Kaczmarek, Addysen ([id number]) -------------------------------------------------------------------------------- Lower Extremity Assessment Details Patient Name: [name] Date of Service: 03/14/2015 3:00 PM Medical Record Number: [id number] Patient Account Number: [id number] Date of Birth/Sex: Oct 10, 1927 (81 y.o. Female) Treating RN: Curtis Sites Primary Care Physician: Loney Laurence Other Clinician: Referring Physician: Loney Laurence Treating Physician/Extender: Rudene Re in Treatment: 2 Edema Assessment Assessed: [Left: No] [Right: No] Edema: [Left: N] [Right: o] Vascular Assessment Pulses: Posterior Tibial Dorsalis Pedis Palpable: [Left:Yes] Extremity colors, hair growth, and conditions: Extremity Color: [Left:Normal] Hair Growth on Extremity: [Left:No] Temperature of Extremity: [Left:Warm] Capillary Refill: [Left:< 3 seconds] Toe Nail Assessment Left: Right: Thick: Yes Discolored: Yes Deformed: No Improper Length and Hygiene: No Electronic Signature(s) Signed: 03/14/2015 4:46:28 PM By: Curtis Sites Entered By: Curtis Sites on 03/14/2015 15:20:47 Sidor, [name] ([id number]) -------------------------------------------------------------------------------- Multi Wound Chart Details Patient Name: [name] Date of Service: 03/14/2015 3:00 PM Medical Record Number: [id number] Patient Account Number: [id number] Date of Birth/Sex: 09/03/1927 (81 y.o. Female) Treating RN: Curtis Sites Primary Care Physician: Loney Laurence Other Clinician: Referring Physician: Loney Laurence Treating Physician/Extender: Rudene Re in Treatment: 2 Vital Signs Height(in): 64 Pulse(bpm): 88 Weight(lbs):  Blood Pressure 131/46 (mmHg): Body Mass Index(BMI): Temperature(F): 98.6 Respiratory Rate 16 (breaths/min): Photos: [1:No Photos] [2:No Photos] [N/A:N/A] Wound Location: [1:Left Calcaneous] [2:Left Foot - Dorsal] [N/A:N/A] Wounding Event: [1:Pressure Injury] [2:Pressure Injury] [N/A:N/A] Primary Etiology: [1:Pressure Ulcer] [2:Pressure Ulcer] [N/A:N/A] Comorbid History: [1:Anemia, Hypertension, Type II Diabetes, History of pressure wounds, Osteoarthritis, Dementia] [2:Anemia, Hypertension, Type II Diabetes, History of pressure wounds, Osteoarthritis, Dementia] [N/A:N/A] Date Acquired: [1:01/30/2015] [2:01/30/2015] [N/A:N/A] Weeks of Treatment: [1:2] [2:2] [N/A:N/A] Wound Status: [1:Open] [2:Open] [N/A:N/A] Measurements L x W x D 4.8x3.9x0.1 [2:0.7x0.7x0.1] [N/A:N/A] (cm) Area (cm) : [1:14.703] [2:0.385] [N/A:N/A] Volume (cm) : [1:1.47] [2:0.038] [N/A:N/A] % Reduction in Area: [1:-52.80%] [2:75.50%] [N/A:N/A] % Reduction in Volume: -52.80% [2:75.80%] [N/A:N/A] Classification: [1:Unstageable/Unclassified] [2:Category/Stage II] [N/A:N/A] HBO Classification: [1:Grade 1] [2:Grade 1] [N/A:N/A]  Exudate Amount: [1:None Present] [2:Small] [N/A:N/A] Wound Margin: [1:Indistinct, nonvisible] [2:Indistinct, nonvisible] [N/A:N/A] Granulation Amount: [1:None Present (0%)] [2:Medium (34-66%)] [N/A:N/A] Granulation Quality: [1:N/A] [2:Pink, Pale] [N/A:N/A] Necrotic Amount: [1:Large (67-100%)] [2:Medium (34-66%)] [N/A:N/A] Necrotic Tissue: [1:Eschar] [2:Adherent Slough] [N/A:N/A] Exposed Structures: [1:Fascia: No Fat: No Tendon: No Muscle: No Joint: No] [2:Fascia: No Fat: No Tendon: No Muscle: No Joint: No] [N/A:N/A] Bone: No Bone: No Limited to Skin Limited to Skin Breakdown Breakdown Epithelialization: None None N/A Periwound Skin Texture: Edema: No Edema: No N/A Excoriation: No Excoriation: No Induration: No Induration: No Callus: No Callus: No Crepitus: No Crepitus: No Fluctuance:  No Fluctuance: No Friable: No Friable: No Rash: No Rash: No Scarring: No Scarring: No Periwound Skin Dry/Scaly: Yes Dry/Scaly: Yes N/A Moisture: Maceration: No Maceration: No Moist: No Moist: No Periwound Skin Color: Atrophie Blanche: No Atrophie Blanche: No N/A Cyanosis: No Cyanosis: No Ecchymosis: No Ecchymosis: No Erythema: No Erythema: No Hemosiderin Staining: No Hemosiderin Staining: No Mottled: No Mottled: No Pallor: No Pallor: No Rubor: No Rubor: No Temperature: No Abnormality No Abnormality N/A Tenderness on Yes Yes N/A Palpation: Wound Preparation: Ulcer Cleansing: Ulcer Cleansing: N/A Rinsed/Irrigated with Rinsed/Irrigated with Saline Saline Topical Anesthetic Topical Anesthetic Applied: Other: lidocaine Applied: Other: lidocaine 4% 4% Treatment Notes Electronic Signature(s) Signed: 03/14/2015 4:46:28 PM By: Curtis Sites Entered By: Curtis Sites on 03/14/2015 15:23:35 Sloniker, [name] ([id number]) -------------------------------------------------------------------------------- Multi-Disciplinary Care Plan Details Patient Name: [name] Date of Service: 03/14/2015 3:00 PM Medical Record Number: [id number] Patient Account Number: [id number] Date of Birth/Sex: 04-07-1928 (81 y.o. Female) Treating RN: Curtis Sites Primary Care Physician: Loney Laurence Other Clinician: Referring Physician: Loney Laurence Treating Physician/Extender: Rudene Re in Treatment: 2 Active Inactive Orientation to the Wound Care Program Nursing Diagnoses: Knowledge deficit related to the wound healing center program Goals: Patient/caregiver will verbalize understanding of the Wound Healing Center Program Date Initiated: 02/27/2015 Goal Status: Active Interventions: Provide education on orientation to the wound center Notes: Pressure Nursing Diagnoses: Knowledge deficit related to management of pressures ulcers Potential for impaired  tissue integrity related to pressure, friction, moisture, and shear Goals: Patient will remain free from development of additional pressure ulcers Date Initiated: 02/27/2015 Goal Status: Active Patient will remain free of pressure ulcers Date Initiated: 02/27/2015 Goal Status: Active Patient/caregiver will verbalize risk factors for pressure ulcer development Date Initiated: 02/27/2015 Goal Status: Active Patient/caregiver will verbalize understanding of pressure ulcer management Date Initiated: 02/27/2015 Goal Status: Active Interventions: Assess: immobility, friction, shearing, incontinence upon admission and as needed Rogerson, Kechia ([id number]) Assess offloading mechanisms upon admission and as needed Assess potential for pressure ulcer upon admission and as needed Provide education on pressure ulcers Treatment Activities: Patient referred for home evaluation of offloading devices/mattresses : 03/14/2015 Patient referred for pressure reduction/relief devices : 03/14/2015 Notes: Wound/Skin Impairment Nursing Diagnoses: Impaired tissue integrity Knowledge deficit related to ulceration/compromised skin integrity Goals: Patient/caregiver will verbalize understanding of skin care regimen Date Initiated: 02/27/2015 Goal Status: Active Ulcer/skin breakdown will have a volume reduction of 30% by week 4 Date Initiated: 02/27/2015 Goal Status: Active Ulcer/skin breakdown will have a volume reduction of 50% by week 8 Date Initiated: 02/27/2015 Goal Status: Active Ulcer/skin breakdown will have a volume reduction of 80% by week 12 Date Initiated: 02/27/2015 Goal Status: Active Ulcer/skin breakdown will heal within 14 weeks Date Initiated: 02/27/2015 Goal Status: Active Interventions: Assess patient/caregiver ability to obtain necessary supplies Assess patient/caregiver ability to perform ulcer/skin care regimen upon admission and as needed Assess ulceration(s) every  visit Provide education on ulcer  and skin care Treatment Activities: Skin care regimen initiated : 03/14/2015 Notes: Electronic Signature(s) LATORA, QUARRY ([id number]) Signed: 03/14/2015 4:46:28 PM By: Curtis Sites Entered By: Curtis Sites on 03/14/2015 15:23:15 Corne, Tommye ([id number]) -------------------------------------------------------------------------------- Wound Assessment Details Patient Name: [name] Date of Service: 03/14/2015 3:00 PM Medical Record Number: [id number] Patient Account Number: [id number] Date of Birth/Sex: 01/16/1928 (81 y.o. Female) Treating RN: Curtis Sites Primary Care Physician: Loney Laurence Other Clinician: Referring Physician: Loney Laurence Treating Physician/Extender: Rudene Re in Treatment: 2 Wound Status Wound Number: 1 Primary Pressure Ulcer Etiology: Wound Location: Left Calcaneous Wound Open Wounding Event: Pressure Injury Status: Date Acquired: 01/30/2015 Comorbid Anemia, Hypertension, Type II Weeks Of Treatment: 2 History: Diabetes, History of pressure wounds, Clustered Wound: No Osteoarthritis, Dementia Photos Photo Uploaded By: Curtis Sites on 03/14/2015 16:32:41 Wound Measurements Length: (cm) 4.8 Width: (cm) 3.9 Depth: (cm) 0.1 Area: (cm) 14.703 Volume: (cm) 1.47 % Reduction in Area: -52.8% % Reduction in Volume: -52.8% Epithelialization: None Tunneling: No Undermining: No Wound Description Classification: Unstageable/Unclassified Foul Od Diabetic Severity (Wagner): Grade 1 Wound Margin: Indistinct, nonvisible Exudate Amount: None Present or After Cleansing: No Wound Bed Granulation Amount: None Present (0%) Exposed Structure Necrotic Amount: Large (67-100%) Fascia Exposed: No Necrotic Quality: Eschar Fat Layer Exposed: No Tendon Exposed: No Muscle Exposed: No Godar, Lakishia ([id number]) Joint Exposed: No Bone Exposed: No Limited to Skin Breakdown Periwound  Skin Texture Texture Color No Abnormalities Noted: No No Abnormalities Noted: No Callus: No Atrophie Blanche: No Crepitus: No Cyanosis: No Excoriation: No Ecchymosis: No Fluctuance: No Erythema: No Friable: No Hemosiderin Staining: No Induration: No Mottled: No Localized Edema: No Pallor: No Rash: No Rubor: No Scarring: No Temperature / Pain Moisture Temperature: No Abnormality No Abnormalities Noted: No Tenderness on Palpation: Yes Dry / Scaly: Yes Maceration: No Moist: No Wound Preparation Ulcer Cleansing: Rinsed/Irrigated with Saline Topical Anesthetic Applied: Other: lidocaine 4%, Electronic Signature(s) Signed: 03/14/2015 4:46:28 PM By: Curtis Sites Entered By: Curtis Sites on 03/14/2015 15:19:56 Roulhac, Kinley ([id number]) -------------------------------------------------------------------------------- Wound Assessment Details Patient Name: [name] Date of Service: 03/14/2015 3:00 PM Medical Record Number: [id number] Patient Account Number: [id number] Date of Birth/Sex: September 03, 1927 (81 y.o. Female) Treating RN: Curtis Sites Primary Care Physician: Loney Laurence Other Clinician: Referring Physician: Loney Laurence Treating Physician/Extender: Rudene Re in Treatment: 2 Wound Status Wound Number: 2 Primary Pressure Ulcer Etiology: Wound Location: Left Foot - Dorsal Wound Open Wounding Event: Pressure Injury Status: Date Acquired: 01/30/2015 Comorbid Anemia, Hypertension, Type II Weeks Of Treatment: 2 History: Diabetes, History of pressure wounds, Clustered Wound: No Osteoarthritis, Dementia Wound Measurements Length: (cm) 0.7 Width: (cm) 0.7 Depth: (cm) 0.1 Area: (cm) 0.385 Volume: (cm) 0.038 % Reduction in Area: 75.5% % Reduction in Volume: 75.8% Epithelialization: None Tunneling: No Undermining: No Wound Description Classification: Category/Stage II Diabetic Severity (Wagner): Grade 1 Wound Margin:  Indistinct, nonvisib Exudate Amount: Small Foul Odor After Cleansing: No le Wound Bed Granulation Amount: Medium (34-66%) Exposed Structure Granulation Quality: Pink, Pale Fascia Exposed: No Necrotic Amount: Medium (34-66%) Fat Layer Exposed: No Necrotic Quality: Adherent Slough Tendon Exposed: No Muscle Exposed: No Joint Exposed: No Bone Exposed: No Limited to Skin Breakdown Periwound Skin Texture Texture Color No Abnormalities Noted: No No Abnormalities Noted: No Callus: No Atrophie Blanche: No Crepitus: No Cyanosis: No Excoriation: No Ecchymosis: No Fluctuance: No Erythema: No Ned, Hedy ([id number]) Friable: No Hemosiderin Staining: No Induration: No Mottled: No Localized Edema: No Pallor: No Rash: No Rubor: No Scarring: No Temperature / Pain Moisture Temperature: No Abnormality No  Abnormalities Noted: No Tenderness on Palpation: Yes Dry / Scaly: Yes Maceration: No Moist: No Wound Preparation Ulcer Cleansing: Rinsed/Irrigated with Saline Topical Anesthetic Applied: Other: lidocaine 4%, Electronic Signature(s) Signed: 03/14/2015 4:46:28 PM By: Curtis Sitesorthy, Joanna Entered By: Curtis Sitesorthy, Joanna on 03/14/2015 15:20:10 Bow, [name] ([id number]) -------------------------------------------------------------------------------- Vitals Details Patient Name: Gloria FateMORDECAI, Kennita Date of Service: 03/14/2015 3:00 PM Medical Record Number: [id number] Patient Account Number: [id number] Date of Birth/Sex: January 10, 1928 (81 y.o. Female) Treating RN: Curtis Sitesorthy, Joanna Primary Care Physician: Loney LaurenceAMPBELL, MARGARET Other Clinician: Referring Physician: Loney LaurenceAMPBELL, MARGARET Treating Physician/Extender: Rudene ReBritto, Errol Weeks in Treatment: 2 Vital Signs Time Taken: 15:12 Temperature (F): 98.6 Height (in): 64 Pulse (bpm): 88 Respiratory Rate (breaths/min): 16 Blood Pressure (mmHg): 131/46 Reference Range: 80 - 120 mg / dl Electronic Signature(s) Signed: 03/14/2015 4:46:28 PM By:  Curtis Sitesorthy, Joanna Entered By: Curtis Sitesorthy, Joanna on 03/14/2015 15:12:39

## 2015-03-21 ENCOUNTER — Ambulatory Visit: Payer: Medicare Other | Admitting: Surgery

## 2015-03-28 ENCOUNTER — Encounter: Payer: Medicare Other | Admitting: Surgery

## 2015-03-28 DIAGNOSIS — E11621 Type 2 diabetes mellitus with foot ulcer: Secondary | ICD-10-CM | POA: Diagnosis not present

## 2015-03-29 NOTE — Progress Notes (Signed)
Gloria, [name] ([id number]) Visit Report for 03/28/2015 Chief Complaint Document Details Patient Name: [name], [name] 03/28/2015 1:00 Date of Service: PM Medical Record [id number] Number: Patient Account Number: [id number] Jan 04, 1928 (81 y.o. Treating RN: Huel Coventry Date of Birth/Sex: Female) Other Clinician: Primary Care Physician: CAMPBELL, MARGARET Treating Evlyn Kanner Referring Physician: Loney Laurence Physician/Extender: Tania Ade in Treatment: 4 Information Obtained from: Patient Chief Complaint Patient is at the clinic for treatment of an open pressure ulcer. Recently noticed to have a pressure injury to the left heel for about 2 weeks now. Electronic Signature(s) Signed: 03/28/2015 1:52:59 PM By: Evlyn Kanner MD, FACS Entered By: Evlyn Kanner on 03/28/2015 13:52:59 Sitts, [name] ([id number]) -------------------------------------------------------------------------------- HPI Details Patient Name: [name], [name] 03/28/2015 1:00 Date of Service: PM Medical Record [id number] Number: Patient Account Number: [id number] May 15, 1927 (81 y.o. Treating RN: Huel Coventry Date of Birth/Sex: Female) Other Clinician: Primary Care Physician: CAMPBELL, MARGARET Treating Evlyn Kanner Referring Physician: Loney Laurence Physician/Extender: Weeks in Treatment: 4 History of Present Illness Location: left heel and dorsum of the foot Quality: Patient reports No Pain. Severity: Patient states wound (s) are getting better. Duration: Patient has had the wound for < 2 weeks prior to presenting for treatment Timing: Pain in wound is Intermittent (comes and goes Context: The wound appeared gradually over time Modifying Factors: Other treatment(s) tried include: local dressings and offloading Associated Signs and Symptoms: Patient reports having difficulty standing for long periods. HPI Description: 81 year old patient with past medical history of Alzheimer's dementia,  diabetes mellitus, hypertension and recent history of right hip arthroplasty in August 2016. Her past medical history is significant also for hypothyroidism, hypertension, urinary retention, dementia. Electronic Signature(s) Signed: 03/28/2015 1:53:05 PM By: Evlyn Kanner MD, FACS Entered By: Evlyn Kanner on 03/28/2015 13:53:04 ERMAL, BRZOZOWSKI ([id number]) -------------------------------------------------------------------------------- Physical Exam Details Patient Name: [name], [name] 03/28/2015 1:00 Date of Service: PM Medical Record [id number] Number: Patient Account Number: [id number] 01-Oct-1927 (81 y.o. Treating RN: Huel Coventry Date of Birth/Sex: Female) Other Clinician: Primary Care Physician: CAMPBELL, MARGARET Treating Evlyn Kanner Referring Physician: Loney Laurence Physician/Extender: Weeks in Treatment: 4 Constitutional . Pulse regular. Respirations normal and unlabored. Afebrile. . Eyes Nonicteric. Reactive to light. Ears, Nose, Mouth, and Throat Lips, teeth, and gums WNL.Marland Kitchen Moist mucosa without lesions . Neck supple and nontender. No palpable supraclavicular or cervical adenopathy. Normal sized without goiter. Respiratory WNL. No retractions.. Cardiovascular Pedal Pulses WNL. No clubbing, cyanosis or edema. Lymphatic No adneopathy. No adenopathy. No adenopathy. Musculoskeletal Adexa without tenderness or enlargement.. Digits and nails w/o clubbing, cyanosis, infection, petechiae, ischemia, or inflammatory conditions.. Integumentary (Hair, Skin) No suspicious lesions. No crepitus or fluctuance. No peri-wound warmth or erythema. No masses.Marland Kitchen Psychiatric Judgement and insight Intact.. No evidence of depression, anxiety, or agitation.. Notes the dorsum of the left ankle is looking very good and there is no open wound. The left heel continues to have dry eschar and towards the posterior part of it there is some separation and it may be infected there. She  is extremely tender but there is no evidence of cellulitis or other pus drainage. Electronic Signature(s) Signed: 03/28/2015 1:53:45 PM By: Evlyn Kanner MD, FACS Entered By: Evlyn Kanner on 03/28/2015 13:53:45 [name] ([id number]) -------------------------------------------------------------------------------- Physician Orders Details Patient Name: RUTHER, EPHRAIM 03/28/2015 1:00 Date of Service: PM Medical Record [id number] Number: Patient Account Number: [id number] 07/25/1927 (81 y.o. Treating RN: Huel Coventry Date of Birth/Sex: Female) Other Clinician: Primary Care Physician: CAMPBELL, MARGARET Treating Evlyn Kanner Referring Physician: Loney Laurence Physician/Extender: Tania Ade in Treatment: 4 Verbal / Phone Orders:  Yes Clinician: Huel CoventryWoody, Kim Read Back and Verified: Yes Diagnosis Coding Wound Cleansing Wound #1 Left Calcaneous o Clean wound with Normal Saline. Wound #2 Left,Dorsal Foot o Clean wound with Normal Saline. Anesthetic Wound #1 Left Calcaneous o Topical Lidocaine 4% cream applied to wound bed prior to debridement Wound #2 Left,Dorsal Foot o Topical Lidocaine 4% cream applied to wound bed prior to debridement Primary Wound Dressing Wound #1 Left Calcaneous o Other: - paint with betadine Wound #2 Left,Dorsal Foot o Foam Secondary Dressing Wound #1 Left Calcaneous o Boardered Foam Dressing Wound #2 Left,Dorsal Foot o Boardered Foam Dressing Dressing Change Frequency Wound #1 Left Calcaneous o Change dressing every other day. Wound #2 Left,Dorsal Foot o Change dressing every other day. Gloria FateMORDECAI, Ophelia ([id number]) Follow-up Appointments Wound #1 Left Calcaneous o Return Appointment in 2 weeks. Wound #2 Left,Dorsal Foot o Return Appointment in 2 weeks. Home Health Wound #1 Left Calcaneous o Continue Home Health Visits o Home Health Nurse may visit PRN to address patientos wound care needs. o FACE TO FACE  ENCOUNTER: MEDICARE and MEDICAID PATIENTS: I certify that this patient is under my care and that I had a face-to-face encounter that meets the physician face-to-face encounter requirements with this patient on this date. The encounter with the patient was in whole or in part for the following MEDICAL CONDITION: (primary reason for Home Healthcare) MEDICAL NECESSITY: I certify, that based on my findings, NURSING services are a medically necessary home health service. HOME BOUND STATUS: I certify that my clinical findings support that this patient is homebound (i.e., Due to illness or injury, pt requires aid of supportive devices such as crutches, cane, wheelchairs, walkers, the use of special transportation or the assistance of another person to leave their place of residence. There is a normal inability to leave the home and doing so requires considerable and taxing effort. Other absences are for medical reasons / religious services and are infrequent or of short duration when for other reasons). o If current dressing causes regression in wound condition, may D/C ordered dressing product/s and apply Normal Saline Moist Dressing daily until next Wound Healing Center / Other MD appointment. Notify Wound Healing Center of regression in wound condition at [phone]. o Please direct any NON-WOUND related issues/requests for orders to patient's Primary Care Physician Wound #2 Left,Dorsal Foot o Continue Home Health Visits o Home Health Nurse may visit PRN to address patientos wound care needs. o FACE TO FACE ENCOUNTER: MEDICARE and MEDICAID PATIENTS: I certify that this patient is under my care and that I had a face-to-face encounter that meets the physician face-to-face encounter requirements with this patient on this date. The encounter with the patient was in whole or in part for the following MEDICAL CONDITION: (primary reason for Home Healthcare) MEDICAL NECESSITY: I certify, that  based on my findings, NURSING services are a medically necessary home health service. HOME BOUND STATUS: I certify that my clinical findings support that this patient is homebound (i.e., Due to illness or injury, pt requires aid of supportive devices such as crutches, cane, wheelchairs, walkers, the use of special transportation or the assistance of another person to leave their place of residence. There is a normal inability to leave the home and doing so requires considerable and taxing effort. Other absences are for medical reasons / religious services and are infrequent or of short duration when for other reasons). o If current dressing causes regression in wound condition, may D/C ordered dressing product/s and apply Normal Saline  Moist Dressing daily until next Wound Healing Center / Other MD appointment. Notify Wound Healing Center of regression in wound condition at [phone]. Behlke, Meg ([id number]) o Please direct any NON-WOUND related issues/requests for orders to patient's Primary Care Physician Medications-please add to medication list. Wound #1 Left Calcaneous o P.O. Antibiotics - doxycycline Wound #2 Left,Dorsal Foot o P.O. Antibiotics - doxycycline Patient Medications Allergies: no known allergies Notifications Medication Indication Start End doxycycline hyclate 03/28/2015 DOSE 1 - oral 100 mg capsule - 1 capsule oral PO BID Electronic Signature(s) Signed: 03/28/2015 1:52:41 PM By: Evlyn Kanner MD, FACS Entered By: Evlyn Kanner on 03/28/2015 13:52:40 Lungren, [name] ([id number]) -------------------------------------------------------------------------------- Problem List Details Patient Name: [name], [name] 03/28/2015 1:00 Date of Service: PM Medical Record [id number] Number: Patient Account Number: [id number] Aug 10, 1927 (81 y.o. Treating RN: Huel Coventry Date of Birth/Sex: Female) Other Clinician: Primary Care Physician: Loney Laurence  Treating Evlyn Kanner Referring Physician: Loney Laurence Physician/Extender: Weeks in Treatment: 4 Active Problems ICD-10 Encounter Code Description Active Date Diagnosis E11.621 Type 2 diabetes mellitus with foot ulcer 02/27/2015 Yes L89.620 Pressure ulcer of left heel, unstageable 02/27/2015 Yes L89.521 Pressure ulcer of left ankle, stage 1 02/27/2015 Yes G30.9 Alzheimer's disease, unspecified 02/27/2015 Yes Inactive Problems Resolved Problems Electronic Signature(s) Signed: 03/28/2015 1:52:54 PM By: Evlyn Kanner MD, FACS Entered By: Evlyn Kanner on 03/28/2015 13:52:53 Mctier, [name] ([id number]) -------------------------------------------------------------------------------- Progress Note Details Patient Name: [name], [name] 03/28/2015 1:00 Date of Service: PM Medical Record [id number] Number: Patient Account Number: [id number] December 08, 1927 (81 y.o. Treating RN: Huel Coventry Date of Birth/Sex: Female) Other Clinician: Primary Care Physician: CAMPBELL, MARGARET Treating Evlyn Kanner Referring Physician: Loney Laurence Physician/Extender: Tania Ade in Treatment: 4 Subjective Chief Complaint Information obtained from Patient Patient is at the clinic for treatment of an open pressure ulcer. Recently noticed to have a pressure injury to the left heel for about 2 weeks now. History of Present Illness (HPI) The following HPI elements were documented for the patient's wound: Location: left heel and dorsum of the foot Quality: Patient reports No Pain. Severity: Patient states wound (s) are getting better. Duration: Patient has had the wound for < 2 weeks prior to presenting for treatment Timing: Pain in wound is Intermittent (comes and goes Context: The wound appeared gradually over time Modifying Factors: Other treatment(s) tried include: local dressings and offloading Associated Signs and Symptoms: Patient reports having difficulty standing for long  periods. 81 year old patient with past medical history of Alzheimer's dementia, diabetes mellitus, hypertension and recent history of right hip arthroplasty in August 2016. Her past medical history is significant also for hypothyroidism, hypertension, urinary retention, dementia. Objective Constitutional Pulse regular. Respirations normal and unlabored. Afebrile. Vitals Time Taken: 1:07 PM, Height: 64 in, Temperature: 98.1 F, Pulse: 97 bpm, Respiratory Rate: 18 breaths/min, Blood Pressure: 138/58 mmHg. Eyes Nonicteric. Reactive to light. Hainsworth, Saavi ([id number]) Ears, Nose, Mouth, and Throat Lips, teeth, and gums WNL.Marland Kitchen Moist mucosa without lesions . Neck supple and nontender. No palpable supraclavicular or cervical adenopathy. Normal sized without goiter. Respiratory WNL. No retractions.. Cardiovascular Pedal Pulses WNL. No clubbing, cyanosis or edema. Lymphatic No adneopathy. No adenopathy. No adenopathy. Musculoskeletal Adexa without tenderness or enlargement.. Digits and nails w/o clubbing, cyanosis, infection, petechiae, ischemia, or inflammatory conditions.Marland Kitchen Psychiatric Judgement and insight Intact.. No evidence of depression, anxiety, or agitation.. General Notes: the dorsum of the left ankle is looking very good and there is no open wound. The left heel continues to have dry eschar and towards the posterior part of it there is some separation  and it may be infected there. She is extremely tender but there is no evidence of cellulitis or other pus drainage. Integumentary (Hair, Skin) No suspicious lesions. No crepitus or fluctuance. No peri-wound warmth or erythema. No masses.. Wound #1 status is Open. Original cause of wound was Pressure Injury. The wound is located on the Left Calcaneous. The wound measures 3.5cm length x 4.2cm width x 0.1cm depth; 11.545cm^2 area and 1.155cm^3 volume. The wound is limited to skin breakdown. There is no tunneling or undermining  noted. There is a small amount of serosanguineous drainage noted. The wound margin is indistinct and nonvisible. There is no granulation within the wound bed. There is a large (67-100%) amount of necrotic tissue within the wound bed including Eschar. The periwound skin appearance did not exhibit: Callus, Crepitus, Excoriation, Fluctuance, Friable, Induration, Localized Edema, Rash, Scarring, Dry/Scaly, Maceration, Moist, Atrophie Blanche, Cyanosis, Ecchymosis, Hemosiderin Staining, Mottled, Pallor, Rubor, Erythema. Periwound temperature was noted as No Abnormality. The periwound has tenderness on palpation. Wound #2 status is Open. Original cause of wound was Pressure Injury. The wound is located on the Left,Dorsal Foot. The wound measures 0.5cm length x 0.4cm width x 0.1cm depth; 0.157cm^2 area and 0.016cm^3 volume. The wound is limited to skin breakdown. There is no tunneling or undermining noted. There is a none present amount of drainage noted. The wound margin is indistinct and nonvisible. There is no granulation within the wound bed. There is a large (67-100%) amount of necrotic tissue within the wound bed including Eschar. The periwound skin appearance exhibited: Dry/Scaly. The periwound skin appearance did not exhibit: Callus, Crepitus, Excoriation, Fluctuance, Friable, Induration, Localized Edema, Rash, Scarring, Maceration, Moist, Atrophie Blanche, Cyanosis, Ecchymosis, Hemosiderin Staining, Mottled, Pallor, Rubor, Erythema. Periwound temperature was noted as No Abnormality. The periwound Wiltsie, Kamry ([id number]) has tenderness on palpation. Assessment Active Problems ICD-10 E11.621 - Type 2 diabetes mellitus with foot ulcer L89.620 - Pressure ulcer of left heel, unstageable L89.521 - Pressure ulcer of left ankle, stage 1 G30.9 - Alzheimer's disease, unspecified I have recommended doxycycline 100 mg twice a day for 14 days. Strict instructions are given to the caregiver that  in case there is any significant pus discharge cellulitis or problems with fever and significant pain she should report to the ER. At the present time we will continue dressing the heel with Betadine and a heel protector. She will continue conservative therapy and see me in 2 weeks time because of the holiday. Plan Wound Cleansing: Wound #1 Left Calcaneous: Clean wound with Normal Saline. Wound #2 Left,Dorsal Foot: Clean wound with Normal Saline. Anesthetic: Wound #1 Left Calcaneous: Topical Lidocaine 4% cream applied to wound bed prior to debridement Wound #2 Left,Dorsal Foot: Topical Lidocaine 4% cream applied to wound bed prior to debridement Primary Wound Dressing: Wound #1 Left Calcaneous: Other: - paint with betadine Wound #2 Left,Dorsal Foot: Foam Secondary Dressing: Wound #1 Left Calcaneous: Boardered Foam Dressing Wound #2 Left,Dorsal Foot: Boardered Foam Dressing Dressing Change Frequency: Wound #1 Left Calcaneous: Jerome, Icyss ([id number]) Change dressing every other day. Wound #2 Left,Dorsal Foot: Change dressing every other day. Follow-up Appointments: Wound #1 Left Calcaneous: Return Appointment in 2 weeks. Wound #2 Left,Dorsal Foot: Return Appointment in 2 weeks. Home Health: Wound #1 Left Calcaneous: Continue Home Health Visits Home Health Nurse may visit PRN to address patient s wound care needs. FACE TO FACE ENCOUNTER: MEDICARE and MEDICAID PATIENTS: I certify that this patient is under my care and that I had a face-to-face encounter that meets the  physician face-to-face encounter requirements with this patient on this date. The encounter with the patient was in whole or in part for the following MEDICAL CONDITION: (primary reason for Home Healthcare) MEDICAL NECESSITY: I certify, that based on my findings, NURSING services are a medically necessary home health service. HOME BOUND STATUS: I certify that my clinical findings support that this patient  is homebound (i.e., Due to illness or injury, pt requires aid of supportive devices such as crutches, cane, wheelchairs, walkers, the use of special transportation or the assistance of another person to leave their place of residence. There is a normal inability to leave the home and doing so requires considerable and taxing effort. Other absences are for medical reasons / religious services and are infrequent or of short duration when for other reasons). If current dressing causes regression in wound condition, may D/C ordered dressing product/s and apply Normal Saline Moist Dressing daily until next Wound Healing Center / Other MD appointment. Notify Wound Healing Center of regression in wound condition at [phone]. Please direct any NON-WOUND related issues/requests for orders to patient's Primary Care Physician Wound #2 Left,Dorsal Foot: Continue Home Health Visits Home Health Nurse may visit PRN to address patient s wound care needs. FACE TO FACE ENCOUNTER: MEDICARE and MEDICAID PATIENTS: I certify that this patient is under my care and that I had a face-to-face encounter that meets the physician face-to-face encounter requirements with this patient on this date. The encounter with the patient was in whole or in part for the following MEDICAL CONDITION: (primary reason for Home Healthcare) MEDICAL NECESSITY: I certify, that based on my findings, NURSING services are a medically necessary home health service. HOME BOUND STATUS: I certify that my clinical findings support that this patient is homebound (i.e., Due to illness or injury, pt requires aid of supportive devices such as crutches, cane, wheelchairs, walkers, the use of special transportation or the assistance of another person to leave their place of residence. There is a normal inability to leave the home and doing so requires considerable and taxing effort. Other absences are for medical reasons / religious services and are  infrequent or of short duration when for other reasons). If current dressing causes regression in wound condition, may D/C ordered dressing product/s and apply Normal Saline Moist Dressing daily until next Wound Healing Center / Other MD appointment. Notify Wound Healing Center of regression in wound condition at [phone]. Please direct any NON-WOUND related issues/requests for orders to patient's Primary Care Physician Medications-please add to medication list.: Wound #1 Left Calcaneous: P.O. Antibiotics - doxycycline Wound #2 Left,Dorsal Foot: P.O. Antibiotics - doxycycline The following medication(s) was prescribed: doxycycline hyclate oral 100 mg capsule 1 1 capsule oral PO BID starting 03/28/2015 Ruegg, Nirvana ([id number]) I have recommended doxycycline 100 mg twice a day for 14 days. Strict instructions are given to the caregiver that in case there is any significant pus discharge cellulitis or problems with fever and significant pain she should report to the ER. At the present time we will continue dressing the heel with Betadine and a heel protector. She will continue conservative therapy and see me in 2 weeks time because of the holiday. Electronic Signature(s) Signed: 03/28/2015 1:54:46 PM By: Evlyn Kanner MD, FACS Entered By: Evlyn Kanner on 03/28/2015 13:54:46 Heagle, [name] ([id number]) -------------------------------------------------------------------------------- SuperBill Details Patient Name: [name] Date of Service: 03/28/2015 Medical Record Number: [id number] Patient Account Number: [id number] Date of Birth/Sex: [birth year] (81 y.o. Female) Treating RN: Huel Coventry Primary Care Physician: Orvan Falconer,  MARGARET Other Clinician: Referring Physician: Loney Laurence Treating Physician/Extender: Rudene Re in Treatment: 4 Diagnosis Coding ICD-10 Codes Code Description E11.621 Type 2 diabetes mellitus with foot ulcer L89.620 Pressure ulcer  of left heel, unstageable L89.521 Pressure ulcer of left ankle, stage 1 G30.9 Alzheimer's disease, unspecified Facility Procedures CPT4 Code: [id number] Description: 29562 - WOUND CARE VISIT-LEV 2 EST PT Modifier: Quantity: 1 Physician Procedures CPT4 Code: 1308657 Description: 99213 - WC PHYS LEVEL 3 - EST PT ICD-10 Description Diagnosis E11.621 Type 2 diabetes mellitus with foot ulcer L89.620 Pressure ulcer of left heel, unstageable L89.521 Pressure ulcer of left ankle, stage 1 G30.9 Alzheimer's disease,  unspecified Modifier: Quantity: 1 Electronic Signature(s) Signed: 03/28/2015 1:55:07 PM By: Evlyn Kanner MD, FACS Entered By: Evlyn Kanner on 03/28/2015 13:55:07

## 2015-03-30 NOTE — Progress Notes (Signed)
Rosie FateMORDECAI, Mushka ([id number]) Visit Report for 03/28/2015 Fall Risk Assessment Details Patient Name: Rosie FateMORDECAI, Anhelica 03/28/2015 1:00 Date of Service: PM Medical Record [id number] Number: Patient Account Number: [id number] 10/30/1927 (81 y.o. Treating RN: Huel CoventryWoody, Kim Date of Birth/Sex: Female) Other Clinician: Primary Care Physician: CAMPBELL, MARGARET Treating Britto, Errol Referring Physician: Loney LaurenceAMPBELL, MARGARET Physician/Extender: Weeks in Treatment: 4 Fall Risk Assessment Items FALL RISK ASSESSMENT: History of falling - immediate or within 3 months 25 Yes Secondary diagnosis 0 No Ambulatory aid None/bed rest/wheelchair/nurse 0 Yes Crutches/cane/walker 15 Yes Furniture 0 No IV Access/Saline Lock 0 No Gait/Training Normal/bed rest/immobile 0 No Weak 10 Yes Impaired 20 Yes Mental Status Oriented to own ability 0 No Electronic Signature(s) Signed: 03/28/2015 5:40:12 PM By: Elliot GurneyWoody, RN, BSN, Kim RN, BSN Entered By: Elliot GurneyWoody, RN, BSN, Kim on 03/28/2015 13:11:24

## 2015-04-01 NOTE — Progress Notes (Signed)
MINDA, FAAS ([id number]) Visit Report for 03/28/2015 Arrival Information Details Patient Name: [name], [name] Date of Service: 03/28/2015 1:00 PM Medical Record Number: [id number] Patient Account Number: [id number] Date of Birth/Sex: Aug 29, 1927 (81 y.o. Female) Treating RN: Huel Coventry Primary Care Physician: Loney Laurence Other Clinician: Referring Physician: Loney Laurence Treating Physician/Extender: Rudene Re in Treatment: 4 Visit Information History Since Last Visit Added or deleted any medications: No Patient Arrived: Wheel Chair Any new allergies or adverse reactions: No Arrival Time: 13:06 Had a fall or experienced change in Yes Accompanied By: caregiver activities of daily living that may affect Transfer Assistance: Manual risk of falls: Patient Identification Verified: Yes Signs or symptoms of abuse/neglect since last No Secondary Verification Process Yes visito Completed: Hospitalized since last visit: No Patient Requires Transmission- No Has Dressing in Place as Prescribed: Yes Based Precautions: Pain Present Now: Yes Patient Has Alerts: Yes Patient Alerts: Patient on Blood Thinner ABI: Non Compressible Electronic Signature(s) Signed: 03/28/2015 5:40:12 PM By: Elliot Gurney, RN, BSN, Kim RN, BSN Entered By: Elliot Gurney, RN, BSN, Kim on 03/28/2015 13:29:34 Clugston, [name] ([id number]) -------------------------------------------------------------------------------- Clinic Level of Care Assessment Details Patient Name: [name] Date of Service: 03/28/2015 1:00 PM Medical Record Number: [id number] Patient Account Number: [id number] Date of Birth/Sex: 03/05/1928 (81 y.o. Female) Treating RN: Huel Coventry Primary Care Physician: Loney Laurence Other Clinician: Referring Physician: Loney Laurence Treating Physician/Extender: Rudene Re in Treatment: 4 Clinic Level of Care Assessment Items TOOL 4 Quantity Score []  - Use when  only an EandM is performed on FOLLOW-UP visit 0 ASSESSMENTS - Nursing Assessment / Reassessment X - Reassessment of Co-morbidities (includes updates in patient status) 1 10 X - Reassessment of Adherence to Treatment Plan 1 5 ASSESSMENTS - Wound and Skin Assessment / Reassessment X - Simple Wound Assessment / Reassessment - one wound 1 5 []  - Complex Wound Assessment / Reassessment - multiple wounds 0 []  - Dermatologic / Skin Assessment (not related to wound area) 0 ASSESSMENTS - Focused Assessment []  - Circumferential Edema Measurements - multi extremities 0 []  - Nutritional Assessment / Counseling / Intervention 0 []  - Lower Extremity Assessment (monofilament, tuning fork, pulses) 0 []  - Peripheral Arterial Disease Assessment (using hand held doppler) 0 ASSESSMENTS - Ostomy and/or Continence Assessment and Care []  - Incontinence Assessment and Management 0 []  - Ostomy Care Assessment and Management (repouching, etc.) 0 PROCESS - Coordination of Care X - Simple Patient / Family Education for ongoing care 1 15 []  - Complex (extensive) Patient / Family Education for ongoing care 0 []  - Staff obtains Chiropractor, Records, Test Results / Process Orders 0 []  - Staff telephones HHA, Nursing Homes / Clarify orders / etc 0 []  - Routine Transfer to another Facility (non-emergent condition) 0 Mikles, Cahterine ([id number]) []  - Routine Hospital Admission (non-emergent condition) 0 []  - New Admissions / Manufacturing engineer / Ordering NPWT, Apligraf, etc. 0 []  - Emergency Hospital Admission (emergent condition) 0 []  - Simple Discharge Coordination 0 []  - Complex (extensive) Discharge Coordination 0 PROCESS - Special Needs []  - Pediatric / Minor Patient Management 0 []  - Isolation Patient Management 0 []  - Hearing / Language / Visual special needs 0 []  - Assessment of Community assistance (transportation, D/C planning, etc.) 0 []  - Additional assistance / Altered mentation 0 []  - Support  Surface(s) Assessment (bed, cushion, seat, etc.) 0 INTERVENTIONS - Wound Cleansing / Measurement X - Simple Wound Cleansing - one wound 1 5 []  - Complex Wound Cleansing - multiple wounds 0 X - Wound Imaging (  photographs - any number of wounds) 1 5  - Wound Tracing (instead of photographs) 0 X - Simple Wound Measurement - one wound 1 5  - Complex Wound Measurement - multiple wounds 0 INTERVENTIONS - Wound Dressings X - Small Wound Dressing one or multiple wounds 1 10  - Medium Wound Dressing one or multiple wounds 0  - Large Wound Dressing one or multiple wounds 0  - Application of Medications - topical 0  - Application of Medications - injection 0 INTERVENTIONS - Miscellaneous  - External ear exam 0 Paschal, Aleene ([id number])  - Specimen Collection (cultures, biopsies, blood, body fluids, etc.) 0  - Specimen(s) / Culture(s) sent or taken to Lab for analysis 0  - Patient Transfer (multiple staff / Michiel Sites Lift / Similar devices) 0  - Simple Staple / Suture removal (25 or less) 0  - Complex Staple / Suture removal (26 or more) 0  - Hypo / Hyperglycemic Management (close monitor of Blood Glucose) 0  - Ankle / Brachial Index (ABI) - do not check if billed separately 0  - Vital Signs 0 Has the patient been seen at the hospital within the last three years: Yes Total Score: 60 Level Of Care: New/Established - Level 2 Electronic Signature(s) Signed: 04/01/2015 9:38:48 AM By: Alejandro Mulling Entered By: Alejandro Mulling on 03/28/2015 13:52:49 Langone, Kandance ([id number]) -------------------------------------------------------------------------------- Encounter Discharge Information Details Patient Name: [name] Date of Service: 03/28/2015 1:00 PM Medical Record Number: [id number] Patient Account Number: [id number] Date of Birth/Sex: 1927-12-15 (81 y.o. Female) Treating RN: Huel Coventry Primary Care Physician: Loney Laurence Other  Clinician: Referring Physician: Loney Laurence Treating Physician/Extender: Rudene Re in Treatment: 4 Encounter Discharge Information Items Discharge Pain Level: 3 Discharge Condition: Stable Ambulatory Status: Wheelchair Discharge Destination: Nursing Home Transportation: Private Auto Accompanied By: caregiver Schedule Follow-up Appointment: Yes Medication Reconciliation completed and provided to Patient/Care Yes Shlome Baldree: Provided on Clinical Summary of Care: 03/28/2015 Form Type Recipient Paper Patient BM Electronic Signature(s) Signed: 03/28/2015 2:01:52 PM By: Gwenlyn Perking Entered By: Gwenlyn Perking on 03/28/2015 14:01:51 [name], [name] ([id number]) -------------------------------------------------------------------------------- Lower Extremity Assessment Details Patient Name: [name] Date of Service: 03/28/2015 1:00 PM Medical Record Number: [id number] Patient Account Number: [id number] Date of Birth/Sex: 1927/12/07 (81 y.o. Female) Treating RN: Huel Coventry Primary Care Physician: Loney Laurence Other Clinician: Referring Physician: Loney Laurence Treating Physician/Extender: Rudene Re in Treatment: 4 Vascular Assessment Pulses: Posterior Tibial Dorsalis Pedis Palpable: [Left:Yes] [Right:Yes] Extremity colors, hair growth, and conditions: Extremity Color: [Left:Normal] [Right:Normal] Hair Growth on Extremity: [Left:No] [Right:No] Temperature of Extremity: [Left:Warm] [Right:Warm] Capillary Refill: [Left:< 3 seconds] [Right:< 3 seconds] Dependent Rubor: [Left:No] [Right:No] Toe Nail Assessment Left: Right: Thick: No Yes Discolored: No No Deformed: No No Improper Length and Hygiene: No No Electronic Signature(s) Signed: 03/28/2015 5:40:12 PM By: Elliot Gurney, RN, BSN, Kim RN, BSN Entered By: Elliot Gurney, RN, BSN, Kim on 03/28/2015 13:37:59 [name], [name]  ([id number]) -------------------------------------------------------------------------------- Multi Wound Chart Details Patient Name: [name] Date of Service: 03/28/2015 1:00 PM Medical Record Number: [id number] Patient Account Number: [id number] Date of Birth/Sex: [birth year] (81 y.o. Female) Treating RN: Huel Coventry Primary Care Physician: Loney Laurence Other Clinician: Referring Physician: Loney Laurence Treating Physician/Extender: Rudene Re in Treatment: 4 Vital Signs Height(in): 64 Pulse(bpm): 97 Weight(lbs): Blood Pressure 138/58 (mmHg): Body Mass Index(BMI): Temperature(F): 98.1 Respiratory Rate 18 (breaths/min): Photos: [1:No Photos] [2:No Photos] [N/A:N/A] Wound Location: [1:Left Calcaneous] [2:Left Foot - Dorsal] [N/A:N/A] Wounding Event: [1:Pressure Injury] [2:Pressure Injury] [N/A:N/A] Primary Etiology: [1:Pressure Ulcer] [2:Pressure Ulcer] [N/A:N/A] Comorbid  History: [1:Anemia, Hypertension, Type II Diabetes, History of pressure wounds, Osteoarthritis, Dementia] [2:Anemia, Hypertension, Type II Diabetes, History of pressure wounds, Osteoarthritis, Dementia] [N/A:N/A] Date Acquired: [1:01/30/2015] [2:01/30/2015] [N/A:N/A] Weeks of Treatment: [1:4] [2:4] [N/A:N/A] Wound Status: [1:Open] [2:Open] [N/A:N/A] Measurements L x W x D 3.5x4.2x0.1 [2:0.5x0.4x0.1] [N/A:N/A] (cm) Area (cm) : [1:11.545] [2:0.157] [N/A:N/A] Volume (cm) : [1:1.155] [2:0.016] [N/A:N/A] % Reduction in Area: [1:-20.00%] [2:90.00%] [N/A:N/A] % Reduction in Volume: -20.10% [2:89.80%] [N/A:N/A] Classification: [1:Unstageable/Unclassified] [2:Category/Stage II] [N/A:N/A] HBO Classification: [1:Grade 1] [2:Grade 1] [N/A:N/A] Exudate Amount: [1:Small] [2:None Present] [N/A:N/A] Exudate Type: [1:Serosanguineous] [2:N/A] [N/A:N/A] Exudate Color: [1:red, brown] [2:N/A] [N/A:N/A] Wound Margin: [1:Indistinct, nonvisible] [2:Indistinct, nonvisible] [N/A:N/A] Granulation Amount:  [1:None Present (0%)] [2:None Present (0%)] [N/A:N/A] Necrotic Amount: [1:Large (67-100%)] [2:Large (67-100%)] [N/A:N/A] Necrotic Tissue: [1:Eschar] [2:Eschar] [N/A:N/A] Exposed Structures: [1:Fascia: No Fat: No Tendon: No Muscle: No] [2:Fascia: No Fat: No Tendon: No Muscle: No] [N/A:N/A] Joint: No Joint: No Bone: No Bone: No Limited to Skin Limited to Skin Breakdown Breakdown Epithelialization: None None N/A Periwound Skin Texture: Edema: No Edema: No N/A Excoriation: No Excoriation: No Induration: No Induration: No Callus: No Callus: No Crepitus: No Crepitus: No Fluctuance: No Fluctuance: No Friable: No Friable: No Rash: No Rash: No Scarring: No Scarring: No Periwound Skin Maceration: No Dry/Scaly: Yes N/A Moisture: Moist: No Maceration: No Dry/Scaly: No Moist: No Periwound Skin Color: Atrophie Blanche: No Atrophie Blanche: No N/A Cyanosis: No Cyanosis: No Ecchymosis: No Ecchymosis: No Erythema: No Erythema: No Hemosiderin Staining: No Hemosiderin Staining: No Mottled: No Mottled: No Pallor: No Pallor: No Rubor: No Rubor: No Temperature: No Abnormality No Abnormality N/A Tenderness on Yes Yes N/A Palpation: Wound Preparation: Ulcer Cleansing: Ulcer Cleansing: N/A Rinsed/Irrigated with Rinsed/Irrigated with Saline Saline Topical Anesthetic Topical Anesthetic Applied: Other: lidocaine Applied: Other: lidocaine 4% 4% Treatment Notes Electronic Signature(s) Signed: 03/28/2015 5:40:12 PM By: Elliot GurneyWoody, RN, BSN, Kim RN, BSN Entered By: Elliot GurneyWoody, RN, BSN, Kim on 03/28/2015 13:19:08 [name], [name] ([id number]) -------------------------------------------------------------------------------- Multi-Disciplinary Care Plan Details Patient Name: [name], Samyiah Date of Service: 03/28/2015 1:00 PM Medical Record Number: [id number] Patient Account Number: [id number] Date of Birth/Sex: Mar 22, 1928 (81 y.o. Female) Treating RN: Huel CoventryWoody, Kim Primary Care Physician:  Loney LaurenceAMPBELL, MARGARET Other Clinician: Referring Physician: Loney LaurenceAMPBELL, MARGARET Treating Physician/Extender: Rudene ReBritto, Errol Weeks in Treatment: 4 Active Inactive Orientation to the Wound Care Program Nursing Diagnoses: Knowledge deficit related to the wound healing center program Goals: Patient/caregiver will verbalize understanding of the Wound Healing Center Program Date Initiated: 02/27/2015 Goal Status: Active Interventions: Provide education on orientation to the wound center Notes: Pressure Nursing Diagnoses: Knowledge deficit related to management of pressures ulcers Potential for impaired tissue integrity related to pressure, friction, moisture, and shear Goals: Patient will remain free from development of additional pressure ulcers Date Initiated: 02/27/2015 Goal Status: Active Patient will remain free of pressure ulcers Date Initiated: 02/27/2015 Goal Status: Active Patient/caregiver will verbalize risk factors for pressure ulcer development Date Initiated: 02/27/2015 Goal Status: Active Patient/caregiver will verbalize understanding of pressure ulcer management Date Initiated: 02/27/2015 Goal Status: Active Interventions: Assess: immobility, friction, shearing, incontinence upon admission and as needed Baisley, Zhara ([id number]) Assess offloading mechanisms upon admission and as needed Assess potential for pressure ulcer upon admission and as needed Provide education on pressure ulcers Treatment Activities: Patient referred for home evaluation of offloading devices/mattresses : 03/28/2015 Patient referred for pressure reduction/relief devices : 03/28/2015 Notes: Wound/Skin Impairment Nursing Diagnoses: Impaired tissue integrity Knowledge deficit related to ulceration/compromised skin integrity Goals: Patient/caregiver will verbalize understanding of skin care regimen Date Initiated: 02/27/2015 Goal  Status: Active Ulcer/skin breakdown will have a volume  reduction of 30% by week 4 Date Initiated: 02/27/2015 Goal Status: Active Ulcer/skin breakdown will have a volume reduction of 50% by week 8 Date Initiated: 02/27/2015 Goal Status: Active Ulcer/skin breakdown will have a volume reduction of 80% by week 12 Date Initiated: 02/27/2015 Goal Status: Active Ulcer/skin breakdown will heal within 14 weeks Date Initiated: 02/27/2015 Goal Status: Active Interventions: Assess patient/caregiver ability to obtain necessary supplies Assess patient/caregiver ability to perform ulcer/skin care regimen upon admission and as needed Assess ulceration(s) every visit Provide education on ulcer and skin care Treatment Activities: Skin care regimen initiated : 03/28/2015 Notes: Electronic Signature(s) [name], [name] ([id number]) Signed: 03/28/2015 5:40:12 PM By: Elliot Gurney, RN, BSN, Kim RN, BSN Entered By: Elliot Gurney, RN, BSN, Kim on 03/28/2015 13:19:00 [name], [name] ([id number]) -------------------------------------------------------------------------------- Pain Assessment Details Patient Name: [name] Date of Service: 03/28/2015 1:00 PM Medical Record Number: [id number] Patient Account Number: [id number] Date of Birth/Sex: Jun 03, 1927 (81 y.o. Female) Treating RN: Huel Coventry Primary Care Physician: Loney Laurence Other Clinician: Referring Physician: Loney Laurence Treating Physician/Extender: Rudene Re in Treatment: 4 Active Problems Location of Pain Severity and Description of Pain Patient Has Paino Patient Unable to Respond Site Locations With Dressing Change: Yes Duration of the Pain. Constant / Intermittento Intermittent Rate the pain. Current Pain Level: 0 Character of Pain Describe the Pain: Tender Pain Management and Medication Current Pain Management: Electronic Signature(s) Signed: 03/28/2015 5:40:12 PM By: Elliot Gurney, RN, BSN, Kim RN, BSN Entered By: Elliot Gurney, RN, BSN, Kim on 03/28/2015 13:10:42 [name],  [name] ([id number]) -------------------------------------------------------------------------------- Patient/Caregiver Education Details Patient Name: [name] Date of Service: 03/28/2015 1:00 PM Medical Record Number: [id number] Patient Account Number: [id number] Date of Birth/Gender: [birth year] (81 y.o. Female) Treating RN: Huel Coventry Primary Care Physician: Loney Laurence Other Clinician: Referring Physician: Loney Laurence Treating Physician/Extender: Rudene Re in Treatment: 4 Education Assessment Education Provided To: Patient and Caregiver Education Topics Provided Wound/Skin Impairment: Handouts: Other: continue wound care as prescribed Methods: Demonstration, Explain/Verbal Responses: State content correctly Electronic Signature(s) Signed: 04/01/2015 9:38:48 AM By: Alejandro Mulling Entered By: Alejandro Mulling on 03/28/2015 13:59:02 Gonzales, Kimani ([id number]) -------------------------------------------------------------------------------- Wound Assessment Details Patient Name: [name] Date of Service: 03/28/2015 1:00 PM Medical Record Number: [id number] Patient Account Number: [id number] Date of Birth/Sex: 1928/04/13 (81 y.o. Female) Treating RN: Huel Coventry Primary Care Physician: Loney Laurence Other Clinician: Referring Physician: Loney Laurence Treating Physician/Extender: Rudene Re in Treatment: 4 Wound Status Wound Number: 1 Primary Pressure Ulcer Etiology: Wound Location: Left Calcaneous Wound Open Wounding Event: Pressure Injury Status: Date Acquired: 01/30/2015 Comorbid Anemia, Hypertension, Type II Weeks Of Treatment: 4 History: Diabetes, History of pressure wounds, Clustered Wound: No Osteoarthritis, Dementia Photos Photo Uploaded By: Elliot Gurney, RN, BSN, Kim on 03/28/2015 14:54:53 Wound Measurements Length: (cm) 3.5 Width: (cm) 4.2 Depth: (cm) 0.1 Area: (cm) 11.545 Volume: (cm) 1.155 %  Reduction in Area: -20% % Reduction in Volume: -20.1% Epithelialization: None Tunneling: No Undermining: No Wound Description Classification: Unstageable/Unclassified Foul Od Diabetic Severity (Wagner): Grade 1 Wound Margin: Indistinct, nonvisible Exudate Amount: Small Exudate Type: Serosanguineous Exudate Color: red, brown or After Cleansing: No Wound Bed Granulation Amount: None Present (0%) Exposed Structure Necrotic Amount: Large (67-100%) Fascia Exposed: No Necrotic Quality: Eschar Fat Layer Exposed: No Skolnick, Deisha ([id number]) Tendon Exposed: No Muscle Exposed: No Joint Exposed: No Bone Exposed: No Limited to Skin Breakdown Periwound Skin Texture Texture Color No Abnormalities Noted: No No Abnormalities Noted: No Callus: No Atrophie Blanche: No Crepitus: No Cyanosis: No  Excoriation: No Ecchymosis: No Fluctuance: No Erythema: No Friable: No Hemosiderin Staining: No Induration: No Mottled: No Localized Edema: No Pallor: No Rash: No Rubor: No Scarring: No Temperature / Pain Moisture Temperature: No Abnormality No Abnormalities Noted: No Tenderness on Palpation: Yes Dry / Scaly: No Maceration: No Moist: No Wound Preparation Ulcer Cleansing: Rinsed/Irrigated with Saline Topical Anesthetic Applied: Other: lidocaine 4%, Treatment Notes Wound #1 (Left Calcaneous) 1. Cleansed with: Clean wound with Normal Saline 2. Anesthetic Topical Lidocaine 4% cream to wound bed prior to debridement 4. Dressing Applied: Foam Other dressing (specify in notes) 5. Secondary Dressing Applied Bordered Foam Dressing Notes painted wound with betadine Electronic Signature(s) Signed: 03/28/2015 5:40:12 PM By: Elliot Gurney, RN, BSN, Kim RN, BSN Entered By: Elliot Gurney, RN, BSN, Kim on 03/28/2015 13:16:43 [name], [name] ([id number]) [name], [name] ([id number]) -------------------------------------------------------------------------------- Wound Assessment Details Patient  Name: [name] Date of Service: 03/28/2015 1:00 PM Medical Record Number: [id number] Patient Account Number: [id number] Date of Birth/Sex: 1927/12/07 (81 y.o. Female) Treating RN: Huel Coventry Primary Care Physician: Loney Laurence Other Clinician: Referring Physician: Loney Laurence Treating Physician/Extender: Rudene Re in Treatment: 4 Wound Status Wound Number: 2 Primary Pressure Ulcer Etiology: Wound Location: Left Foot - Dorsal Wound Open Wounding Event: Pressure Injury Status: Date Acquired: 01/30/2015 Comorbid Anemia, Hypertension, Type II Weeks Of Treatment: 4 History: Diabetes, History of pressure wounds, Clustered Wound: No Osteoarthritis, Dementia Photos Photo Uploaded By: Elliot Gurney, RN, BSN, Kim on 03/28/2015 14:54:54 Wound Measurements Length: (cm) 0.5 Width: (cm) 0.4 Depth: (cm) 0.1 Area: (cm) 0.157 Volume: (cm) 0.016 % Reduction in Area: 90% % Reduction in Volume: 89.8% Epithelialization: None Tunneling: No Undermining: No Wound Description Classification: Category/Stage II Diabetic Severity [address]): Grade 1 Wound Margin: Indistinct, nonvisib Exudate Amount: None Present Foul Odor After Cleansing: No le Wound Bed Granulation Amount: None Present (0%) Exposed Structure Necrotic Amount: Large (67-100%) Fascia Exposed: No Necrotic Quality: Eschar Fat Layer Exposed: No Tendon Exposed: No Muscle Exposed: No [name], [name] ([id number]) Joint Exposed: No Bone Exposed: No Limited to Skin Breakdown Periwound Skin Texture Texture Color No Abnormalities Noted: No No Abnormalities Noted: No Callus: No Atrophie Blanche: No Crepitus: No Cyanosis: No Excoriation: No Ecchymosis: No Fluctuance: No Erythema: No Friable: No Hemosiderin Staining: No Induration: No Mottled: No Localized Edema: No Pallor: No Rash: No Rubor: No Scarring: No Temperature / Pain Moisture Temperature: No Abnormality No Abnormalities Noted:  No Tenderness on Palpation: Yes Dry / Scaly: Yes Maceration: No Moist: No Wound Preparation Ulcer Cleansing: Rinsed/Irrigated with Saline Topical Anesthetic Applied: Other: lidocaine 4%, Treatment Notes Wound #2 (Left, Dorsal Foot) 1. Cleansed with: Clean wound with Normal Saline 2. Anesthetic Topical Lidocaine 4% cream to wound bed prior to debridement 4. Dressing Applied: Foam Other dressing (specify in notes) 5. Secondary Dressing Applied Bordered Foam Dressing Notes painted wound with betadine Electronic Signature(s) Signed: 03/28/2015 5:40:12 PM By: Elliot Gurney, RN, BSN, Kim RN, BSN Entered By: Elliot Gurney, RN, BSN, Kim on 03/28/2015 13:18:24 Kuchera, [name] ([id number]) -------------------------------------------------------------------------------- Vitals Details Patient Name: [name] Date of Service: 03/28/2015 1:00 PM Medical Record Number: [id number] Patient Account Number: [id number] Date of Birth/Sex: 1928/02/16 (81 y.o. Female) Treating RN: Huel Coventry Primary Care Physician: Loney Laurence Other Clinician: Referring Physician: Loney Laurence Treating Physician/Extender: Rudene Re in Treatment: 4 Vital Signs Time Taken: 13:07 Temperature (F): 98.1 Height (in): 64 Pulse (bpm): 97 Respiratory Rate (breaths/min): 18 Blood Pressure (mmHg): 138/58 Reference Range: 80 - 120 mg / dl Electronic Signature(s) Signed: 03/28/2015 5:40:12 PM By: Elliot Gurney, RN, BSN, Kim RN,  BSN Entered By: Elliot Gurney, RN, BSN, Kim on 03/28/2015 13:37:55

## 2015-04-11 ENCOUNTER — Emergency Department: Payer: Medicare Other

## 2015-04-11 ENCOUNTER — Other Ambulatory Visit: Payer: Self-pay

## 2015-04-11 ENCOUNTER — Encounter: Payer: Self-pay | Admitting: Medical Oncology

## 2015-04-11 ENCOUNTER — Inpatient Hospital Stay
Admission: EM | Admit: 2015-04-11 | Discharge: 2015-04-14 | DRG: 988 | Disposition: A | Discharge status: Skilled Nursing Facility | Payer: Medicare Other | Attending: Internal Medicine | Admitting: Internal Medicine

## 2015-04-11 ENCOUNTER — Encounter: Payer: Medicare Other | Attending: Surgery | Admitting: Surgery

## 2015-04-11 DIAGNOSIS — H9190 Unspecified hearing loss, unspecified ear: Secondary | ICD-10-CM | POA: Diagnosis present

## 2015-04-11 DIAGNOSIS — Z79899 Other long term (current) drug therapy: Secondary | ICD-10-CM

## 2015-04-11 DIAGNOSIS — L89620 Pressure ulcer of left heel, unstageable: Secondary | ICD-10-CM | POA: Insufficient documentation

## 2015-04-11 DIAGNOSIS — I739 Peripheral vascular disease, unspecified: Secondary | ICD-10-CM | POA: Diagnosis present

## 2015-04-11 DIAGNOSIS — F0280 Dementia in other diseases classified elsewhere without behavioral disturbance: Secondary | ICD-10-CM | POA: Diagnosis present

## 2015-04-11 DIAGNOSIS — H547 Unspecified visual loss: Secondary | ICD-10-CM | POA: Diagnosis present

## 2015-04-11 DIAGNOSIS — L03116 Cellulitis of left lower limb: Secondary | ICD-10-CM | POA: Insufficient documentation

## 2015-04-11 DIAGNOSIS — E039 Hypothyroidism, unspecified: Secondary | ICD-10-CM | POA: Diagnosis present

## 2015-04-11 DIAGNOSIS — L97429 Non-pressure chronic ulcer of left heel and midfoot with unspecified severity: Secondary | ICD-10-CM | POA: Diagnosis present

## 2015-04-11 DIAGNOSIS — Z7982 Long term (current) use of aspirin: Secondary | ICD-10-CM | POA: Diagnosis not present

## 2015-04-11 DIAGNOSIS — E1140 Type 2 diabetes mellitus with diabetic neuropathy, unspecified: Secondary | ICD-10-CM | POA: Diagnosis present

## 2015-04-11 DIAGNOSIS — R2681 Unsteadiness on feet: Secondary | ICD-10-CM | POA: Diagnosis present

## 2015-04-11 DIAGNOSIS — E1152 Type 2 diabetes mellitus with diabetic peripheral angiopathy with gangrene: Secondary | ICD-10-CM | POA: Diagnosis present

## 2015-04-11 DIAGNOSIS — L089 Local infection of the skin and subcutaneous tissue, unspecified: Secondary | ICD-10-CM

## 2015-04-11 DIAGNOSIS — G309 Alzheimer's disease, unspecified: Secondary | ICD-10-CM | POA: Diagnosis present

## 2015-04-11 DIAGNOSIS — T148XXA Other injury of unspecified body region, initial encounter: Secondary | ICD-10-CM

## 2015-04-11 DIAGNOSIS — E785 Hyperlipidemia, unspecified: Secondary | ICD-10-CM | POA: Diagnosis present

## 2015-04-11 DIAGNOSIS — E11621 Type 2 diabetes mellitus with foot ulcer: Secondary | ICD-10-CM | POA: Insufficient documentation

## 2015-04-11 DIAGNOSIS — I10 Essential (primary) hypertension: Secondary | ICD-10-CM | POA: Diagnosis present

## 2015-04-11 DIAGNOSIS — L89521 Pressure ulcer of left ankle, stage 1: Secondary | ICD-10-CM | POA: Insufficient documentation

## 2015-04-11 DIAGNOSIS — G2581 Restless legs syndrome: Secondary | ICD-10-CM | POA: Diagnosis present

## 2015-04-11 DIAGNOSIS — I96 Gangrene, not elsewhere classified: Secondary | ICD-10-CM | POA: Diagnosis present

## 2015-04-11 DIAGNOSIS — Z794 Long term (current) use of insulin: Secondary | ICD-10-CM

## 2015-04-11 DIAGNOSIS — Z96641 Presence of right artificial hip joint: Secondary | ICD-10-CM | POA: Diagnosis present

## 2015-04-11 DIAGNOSIS — L8990 Pressure ulcer of unspecified site, unspecified stage: Secondary | ICD-10-CM | POA: Insufficient documentation

## 2015-04-11 DIAGNOSIS — T148 Other injury of unspecified body region: Secondary | ICD-10-CM | POA: Diagnosis present

## 2015-04-11 LAB — BASIC METABOLIC PANEL
Anion gap: 10 (ref 5–15)
BUN: 20 mg/dL (ref 6–20)
CHLORIDE: 101 mmol/L (ref 101–111)
CO2: 26 mmol/L (ref 22–32)
CREATININE: 0.73 mg/dL (ref 0.44–1.00)
Calcium: 9.3 mg/dL (ref 8.9–10.3)
Glucose, Bld: 198 mg/dL — ABNORMAL HIGH (ref 65–99)
Potassium: 4.7 mmol/L (ref 3.5–5.1)
SODIUM: 137 mmol/L (ref 135–145)

## 2015-04-11 LAB — GLUCOSE, CAPILLARY
GLUCOSE-CAPILLARY: 106 mg/dL — AB (ref 65–99)
Glucose-Capillary: 140 mg/dL — ABNORMAL HIGH (ref 65–99)
Glucose-Capillary: 119 mg/dL — ABNORMAL HIGH (ref 65–99)

## 2015-04-11 LAB — CBC WITH DIFFERENTIAL/PLATELET
BASOS ABS: 0.1 10*3/uL (ref 0–0.1)
BASOS PCT: 1 %
EOS ABS: 0.1 10*3/uL (ref 0–0.7)
EOS PCT: 1 %
HEMATOCRIT: 34.6 % — AB (ref 35.0–47.0)
Hemoglobin: 11.2 g/dL — ABNORMAL LOW (ref 12.0–16.0)
Lymphocytes Relative: 21 %
Lymphs Abs: 1.6 10*3/uL (ref 1.0–3.6)
MCH: 26.6 pg (ref 26.0–34.0)
MCHC: 32.4 g/dL (ref 32.0–36.0)
MCV: 82.1 fL (ref 80.0–100.0)
MONO ABS: 0.5 10*3/uL (ref 0.2–0.9)
MONOS PCT: 6 %
Neutro Abs: 5.6 10*3/uL (ref 1.4–6.5)
Neutrophils Relative %: 71 %
PLATELETS: 360 10*3/uL (ref 150–440)
RBC: 4.21 MIL/uL (ref 3.80–5.20)
RDW: 15.2 % — ABNORMAL HIGH (ref 11.5–14.5)
WBC: 7.9 10*3/uL (ref 3.6–11.0)

## 2015-04-11 LAB — SEDIMENTATION RATE: SED RATE: 65 mm/h — AB (ref 0–30)

## 2015-04-11 LAB — HEMOGLOBIN A1C: Hgb A1c MFr Bld: 6.6 % — ABNORMAL HIGH (ref 4.0–6.0)

## 2015-04-11 MED ORDER — SODIUM CHLORIDE 0.9 % IV SOLN
INTRAVENOUS | Status: DC
Start: 2015-04-11 — End: 2015-04-14
  Administered 2015-04-11 – 2015-04-14 (×4): via INTRAVENOUS

## 2015-04-11 MED ORDER — FERROUS SULFATE 325 (65 FE) MG PO TABS
325.0000 mg | ORAL_TABLET | Freq: Two times a day (BID) | ORAL | Status: DC
  Administered 2015-04-12 – 2015-04-14 (×5): 325 mg via ORAL
  Filled 2015-04-11 (×5): qty 1

## 2015-04-11 MED ORDER — LISINOPRIL 5 MG PO TABS
5.0000 mg | ORAL_TABLET | Freq: Every day | ORAL | Status: DC
  Administered 2015-04-12 – 2015-04-14 (×3): 5 mg via ORAL
  Filled 2015-04-11 (×3): qty 1

## 2015-04-11 MED ORDER — MORPHINE SULFATE (PF) 2 MG/ML IV SOLN
2.0000 mg | Freq: Once | INTRAVENOUS | Status: AC
  Administered 2015-04-11: 2 mg via INTRAVENOUS
  Filled 2015-04-11: qty 1

## 2015-04-11 MED ORDER — LEVOTHYROXINE SODIUM 75 MCG PO TABS
75.0000 ug | ORAL_TABLET | Freq: Every day | ORAL | Status: DC
  Administered 2015-04-13 – 2015-04-14 (×2): 75 ug via ORAL
  Filled 2015-04-11 (×4): qty 1

## 2015-04-11 MED ORDER — TAMSULOSIN HCL 0.4 MG PO CAPS
0.4000 mg | ORAL_CAPSULE | Freq: Every day | ORAL | Status: DC
  Administered 2015-04-12 – 2015-04-14 (×3): 0.4 mg via ORAL
  Filled 2015-04-11 (×3): qty 1

## 2015-04-11 MED ORDER — ACETAMINOPHEN 650 MG RE SUPP: 650.0000 mg | Freq: Four times a day (QID) | RECTAL | Status: DC | PRN

## 2015-04-11 MED ORDER — INSULIN ASPART 100 UNIT/ML ~~LOC~~ SOLN
0.0000 [IU] | Freq: Three times a day (TID) | SUBCUTANEOUS | Status: DC
  Administered 2015-04-11: 1 [IU] via SUBCUTANEOUS
  Administered 2015-04-12: 2 [IU] via SUBCUTANEOUS
  Administered 2015-04-12: 1 [IU] via SUBCUTANEOUS
  Administered 2015-04-13: 3 [IU] via SUBCUTANEOUS
  Administered 2015-04-13 – 2015-04-14 (×3): 1 [IU] via SUBCUTANEOUS
  Administered 2015-04-14: 2 [IU] via SUBCUTANEOUS
  Filled 2015-04-11: qty 2
  Filled 2015-04-11 (×2): qty 1
  Filled 2015-04-11: qty 2
  Filled 2015-04-11: qty 3
  Filled 2015-04-11: qty 1
  Filled 2015-04-11: qty 2
  Filled 2015-04-11: qty 1
  Filled 2015-04-11: qty 2
  Filled 2015-04-11: qty 1

## 2015-04-11 MED ORDER — SODIUM CHLORIDE 0.9 % IV SOLN
1.5000 g | Freq: Four times a day (QID) | INTRAVENOUS | Status: DC
  Administered 2015-04-11 – 2015-04-14 (×13): 1.5 g via INTRAVENOUS
  Filled 2015-04-11 (×18): qty 1.5

## 2015-04-11 MED ORDER — OXYCODONE HCL 5 MG PO TABS
5.0000 mg | ORAL_TABLET | ORAL | Status: DC | PRN
  Administered 2015-04-12 – 2015-04-13 (×2): 5 mg via ORAL
  Filled 2015-04-11 (×2): qty 1

## 2015-04-11 MED ORDER — SERTRALINE HCL 50 MG PO TABS
50.0000 mg | ORAL_TABLET | Freq: Every day | ORAL | Status: DC
  Administered 2015-04-12 – 2015-04-14 (×3): 50 mg via ORAL
  Filled 2015-04-11 (×3): qty 1

## 2015-04-11 MED ORDER — VANCOMYCIN HCL IN DEXTROSE 750-5 MG/150ML-% IV SOLN
750.0000 mg | INTRAVENOUS | Status: DC
  Administered 2015-04-11 – 2015-04-14 (×4): 750 mg via INTRAVENOUS
  Filled 2015-04-11 (×4): qty 150

## 2015-04-11 MED ORDER — TRAZODONE HCL 50 MG PO TABS
50.0000 mg | ORAL_TABLET | Freq: Every day | ORAL | Status: DC
  Administered 2015-04-11 – 2015-04-13 (×3): 50 mg via ORAL
  Filled 2015-04-11 (×4): qty 1

## 2015-04-11 MED ORDER — SIMVASTATIN 20 MG PO TABS
20.0000 mg | ORAL_TABLET | Freq: Every evening | ORAL | Status: DC
  Administered 2015-04-12 – 2015-04-14 (×3): 20 mg via ORAL
  Filled 2015-04-11 (×3): qty 1

## 2015-04-11 MED ORDER — INSULIN ASPART 100 UNIT/ML ~~LOC~~ SOLN: 0.0000 [IU] | Freq: Every day | SUBCUTANEOUS | Status: DC

## 2015-04-11 MED ORDER — ACETAMINOPHEN 325 MG PO TABS: 650.0000 mg | ORAL_TABLET | Freq: Four times a day (QID) | ORAL | Status: DC | PRN

## 2015-04-11 MED ORDER — VANCOMYCIN HCL IN DEXTROSE 750-5 MG/150ML-% IV SOLN
750.0000 mg | INTRAVENOUS | Status: AC
  Administered 2015-04-11: 750 mg via INTRAVENOUS
  Filled 2015-04-11: qty 150

## 2015-04-11 MED ORDER — SENNOSIDES-DOCUSATE SODIUM 8.6-50 MG PO TABS
2.0000 | ORAL_TABLET | Freq: Two times a day (BID) | ORAL | Status: DC
  Administered 2015-04-11 – 2015-04-14 (×5): 2 via ORAL
  Filled 2015-04-11 (×5): qty 2

## 2015-04-11 NOTE — H&P (Signed)
[facility] NAME: [name]    MR#:  [id number]  DATE OF BIRTH:  11/22/1927  DATE OF ADMISSION:  04/11/2015  PRIMARY CARE PHYSICIAN: CAMPBELL, Jackelyn Poling, FNP   REQUESTING/REFERRING PHYSICIAN: Larae Grooms  CHIEF COMPLAINT:   Chief Complaint  Patient presents with  . Wound Infection    HISTORY OF PRESENT ILLNESS:  [name]  is a 81 y.o. female with a known history of left heel infection going on for a few months. She is followed at the wound care center. She was seen at the wound care center today and sent in for further evaluation. She's been on doxycycline for 10 days. Left heel is gangrenous and draining pus. The patient has pain there when somebody touches it. The patient is very anxious about being here in the hospital.  PAST MEDICAL HISTORY:   Past Medical History  Diagnosis Date  . Alzheimer's dementia   . Diabetes type 2, controlled ([facility])   . Hypothyroidism   . Vision impairment   . Hearing impairment   . Hypertension   . Unsteady gait     PAST SURGICAL HISTORY:   Past Surgical History  Procedure Laterality Date  . No past surgeries    . Hip arthroplasty Right 01/08/2015    Procedure: ARTHROPLASTY BIPOLAR HIP (HEMIARTHROPLASTY);  Surgeon: Thornton Park, MD;  Location: [facility] ORS;  Service: Orthopedics;  Laterality: Right;    SOCIAL HISTORY:   Social History  Substance Use Topics  . Smoking status: Never Smoker   . Smokeless tobacco: Not on file  . Alcohol Use: No    FAMILY HISTORY:   Family History  Problem Relation Age of Onset  . Family history unknown: Yes   Secondary to dementia I'm not able to get a good family history. patient believes her parents died of natural causes DRUG ALLERGIES:  No Known Allergies  REVIEW OF SYSTEMS:  CONSTITUTIONAL: No fever, fatigue or weakness. Positive for weight loss and decreased appetite EYES: No blurred or double vision. Poor  vision and can't find her glasses EARS, NOSE, AND THROAT: No tinnitus or ear pain. No sore throat. Decreased hearing RESPIRATORY: No cough, shortness of breath, wheezing or hemoptysis.  CARDIOVASCULAR: No chest pain, orthopnea, edema.  GASTROINTESTINAL: No nausea, vomiting, diarrhea or abdominal pain. No blood in bowel movements GENITOURINARY: No dysuria, hematuria.  ENDOCRINE: No polyuria, nocturia, hypothyroidism HEMATOLOGY: No anemia, easy bruising or bleeding SKIN: No rash or lesion. MUSCULOSKELETAL: No joint pain or arthritis.   NEUROLOGIC: No tingling, numbness, weakness.  PSYCHIATRY: No anxiety or depression.   MEDICATIONS AT HOME:   Prior to Admission medications   Medication Sig Start Date End Date Taking? Authorizing Provider  acetaminophen (TYLENOL) 325 MG tablet Take 325 mg by mouth every 6 (six) hours as needed for mild pain or moderate pain.   Yes Historical Provider, MD  aspirin EC 325 MG tablet Take 325 mg by mouth daily.   Yes Historical Provider, MD  doxycycline (VIBRA-TABS) 100 MG tablet Take 100 mg by mouth 2 (two) times daily.   Yes Historical Provider, MD  ferrous sulfate 325 (65 FE) MG tablet Take 1 tablet (325 mg total) by mouth 2 (two) times daily with a meal. Patient taking differently: Take 325 mg by mouth 3 (three) times daily before meals.  01/10/15  Yes Srikar Sudini, MD  insulin aspart (NOVOLOG) 100 UNIT/ML injection Inject into the skin 3 (three) times daily as needed for high blood  sugar (per sliding scale).   Yes Historical Provider, MD  insulin detemir (LEVEMIR) 100 UNIT/ML injection Inject 0.25 mLs (25 Units total) into the skin at bedtime. Patient taking differently: Inject 20 Units into the skin at bedtime.  01/10/15  Yes Srikar Sudini, MD  levothyroxine (SYNTHROID, LEVOTHROID) 75 MCG tablet Take 75 mcg by mouth daily.   Yes Historical Provider, MD  lisinopril (PRINIVIL,ZESTRIL) 5 MG tablet Take 5 mg by mouth daily.   Yes Historical Provider, MD  metFORMIN  (GLUCOPHAGE) 500 MG tablet Take 500 mg by mouth daily.   Yes Historical Provider, MD  senna-docusate (SENOKOT-S) 8.6-50 MG tablet Take 2 tablets by mouth 2 (two) times daily.   Yes Historical Provider, MD  sertraline (ZOLOFT) 50 MG tablet Take 50 mg by mouth daily.   Yes Historical Provider, MD  simvastatin (ZOCOR) 20 MG tablet Take 20 mg by mouth every evening.   Yes Historical Provider, MD  tamsulosin (FLOMAX) 0.4 MG CAPS capsule Take 1 capsule (0.4 mg total) by mouth daily. 02/03/15  Yes Shannon A McGowan, PA-C  traZODone (DESYREL) 50 MG tablet Take 50 mg by mouth at bedtime.   Yes Historical Provider, MD  docusate sodium (COLACE) 100 MG capsule Take 1 capsule (100 mg total) by mouth 2 (two) times daily. Patient not taking: Reported on 04/11/2015 01/10/15   Hillary Bow, MD  enoxaparin (LOVENOX) 30 MG/0.3ML injection Inject 0.3 mLs (30 mg total) into the skin every 12 (twelve) hours. Patient not taking: Reported on 02/03/2015 01/10/15 02/09/15  Thornton Park, MD  metFORMIN (GLUCOPHAGE-XR) 500 MG 24 hr tablet Take 1 tablet (500 mg total) by mouth daily with breakfast. Patient not taking: Reported on 04/11/2015 01/11/15   Vaughan Basta, MD  oxyCODONE (OXY IR/ROXICODONE) 5 MG immediate release tablet Take 1 tablet (5 mg total) by mouth every 4 (four) hours as needed for breakthrough pain ((for MODERATE breakthrough pain)). Patient not taking: Reported on 04/11/2015 01/10/15   Hillary Bow, MD  polyethylene glycol (MIRALAX / GLYCOLAX) packet Take 17 g by mouth daily as needed for mild constipation. Patient not taking: Reported on 04/11/2015 01/10/15   Hillary Bow, MD      VITAL SIGNS:  Blood pressure 134/72, pulse 86, temperature 98.2 F (36.8 C), temperature source Oral, resp. rate 18, height '5\' 4"'  (1.626 m), weight 53.978 kg (119 lb), SpO2 100 %.  PHYSICAL EXAMINATION:  GENERAL:  81 y.o.-year-old patient lying in the bed with no acute distress.  EYES: Pupils equal, round, reactive to light and  accommodation. No scleral icterus. Extraocular muscles intact.  HEENT: Head atraumatic, normocephalic. Oropharynx and nasopharynx clear.  NECK:  Supple, no jugular venous distention. No thyroid enlargement, no tenderness.  LUNGS: Normal breath sounds bilaterally, no wheezing, rales,rhonchi or crepitation. No use of accessory muscles of respiration.  CARDIOVASCULAR: S1, S2 normal. No murmurs, rubs, or gallops. Unable to palpate dorsalis pedis or posterior tibial pulses bilaterally ABDOMEN: Soft, nontender, nondistended. Bowel sounds present. No organomegaly or mass.  EXTREMITIES: Trace pedal edema, cyanosis, or clubbing.  NEUROLOGIC: Cranial nerves II through XII are intact. Muscle strength 5/5 in all extremities. Sensation intact. Gait not checked.  PSYCHIATRIC: The patient is alert and oriented x 3.  SKIN: Left heel gangrenous with pus draining from part of the wound.  LABORATORY PANEL:   CBC  Recent Labs Lab 04/11/15 0927  WBC 7.9  HGB 11.2*  HCT 34.6*  PLT 360   ------------------------------------------------------------------------------------------------------------------  Chemistries   Recent Labs Lab 04/11/15 0927  NA 137  K 4.7  CL 101  CO2 26  GLUCOSE 198*  BUN 20  CREATININE 0.73  CALCIUM 9.3   ------------------------------------------------------------------------------------------------------------------   EKG:   Normal sinus rhythm 85 bpm premature atrial complex  IMPRESSION AND PLAN:   1. Gangrene left heel. This could be an osteomyelitis. Empiric vancomycin and Unasyn. Patient failed outpatient doxycycline. Get a podiatry consultation to evaluate for debridement. I will check an ESR and get a wound culture. Wet to dry dressings. No contraindications to surgery. Tammy at [facility] phone number [phone]. Daughter Leda Gauze phone number [phone]. X-ray results pending. I will leave it up to podiatry on whether he wants to get an  MRI or not. Likely will end up needing a vascular surgery consultation for poor circulation. I will keep nothing by mouth for right now just in case podiatry wants to take to the operating room. 2. Type 2 diabetes- sliding-scale, check a hemoglobin A1c 3. Essential hypertension continue lisinopril 4. Hyperlipidemia unspecified continue Zocor 5. Hypothyroidism unspecified continue levothyroxin 6. Alzheimer's dementia without behavioral disturbance   All the records are reviewed and case discussed with ED provider. Management plans discussed with the patient, and Tammy from [facility] and they are in agreement.  CODE STATUS: Full code  TOTAL TIME TAKING CARE OF THIS PATIENT: 50 minutes.    Loletha Grayer M.D on 04/11/2015 at 10:18 AM  Between 7am to 6pm - Pager - [phone]  After 6pm call admission pager [facility]  Office  [phone]  CC: Primary care physician; Roxana Hires, FNP
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
## 2015-04-11 NOTE — ED Provider Notes (Addendum)
[facility] Emergency Department Provider Note  ____________________________________________  Time seen: Approximately 9:20 AM  I have reviewed the triage vital signs and the nursing notes.   HISTORY  Chief Complaint Wound Infection  History Limited secondary patient's dementia. Per caretaker who is at the bedside the patient was sent in from the wound care center because of the left foot infection.  HPI [name] is a 80 y.o. female with a history of type 2 diabetes and a left heel wound who was sent in by the wound center today for worsening infection or left heel. The patient has been having increased pain and drainage from the wound of her left heel. She recently finished a course of doxycycline oral and went in today for follow-up at the wound center. Because of the patient's dementia she was not able to give a thorough history. She is here with a caretaker, who is able to help with some details but not with specifics of the appearance of the wound. I was able to discuss the case with Dr. Meyer Russel, of the wound center, who says that the patient had "bubbling pus" from the wound when she arrived this morning at the wound center. It was thoroughly cleaned over there and then she was sent to the emergency department for admission for IV antibiotics and debridement.Dr. Meyer Russel said that he has never been able to get palpable pulses but they're usually able to get a Doppler pulse in the foot.   Past Medical History  Diagnosis Date  . Alzheimer's dementia   . Diabetes type 2, controlled ([facility])   . Hypothyroidism   . Vision impairment   . Hearing impairment   . Hypertension   . Unsteady gait     Patient Active Problem List   Diagnosis Date Noted  . Urinary retention 02/04/2015  . Atelectasis 01/10/2015  . Acute encephalopathy 01/10/2015  . Dementia 01/10/2015  . Closed right hip fracture ([facility]) 01/07/2015    Past Surgical History  Procedure Laterality  Date  . No past surgeries    . Hip arthroplasty Right 01/08/2015    Procedure: ARTHROPLASTY BIPOLAR HIP (HEMIARTHROPLASTY);  Surgeon: [name], MD;  Location: [facility] ORS;  Service: Orthopedics;  Laterality: Right;    Current Outpatient Rx  Name  Route  Sig  Dispense  Refill  . aspirin EC 81 MG tablet   Oral   Take 81 mg by mouth daily.         Marland Kitchen docusate sodium (COLACE) 100 MG capsule   Oral   Take 1 capsule (100 mg total) by mouth 2 (two) times daily.   10 capsule   0   . EXPIRED: enoxaparin (LOVENOX) 30 MG/0.3ML injection   Subcutaneous   Inject 0.3 mLs (30 mg total) into the skin every 12 (twelve) hours. Patient not taking: Reported on 02/03/2015   42 Syringe   0   . ferrous sulfate 325 (65 FE) MG tablet   Oral   Take 1 tablet (325 mg total) by mouth 2 (two) times daily with a meal.   60 tablet   0   . GLUCERNA (GLUCERNA) LIQD   Oral   Take 237 mLs by mouth.         . insulin aspart protamine- aspart (NOVOLOG MIX 70/30) (70-30) 100 UNIT/ML injection   Subcutaneous   Inject into the skin.         Marland Kitchen insulin detemir (LEVEMIR) 100 UNIT/ML injection   Subcutaneous   Inject 0.25 mLs (  25 Units total) into the skin at bedtime.   10 mL   0   . levothyroxine (SYNTHROID, LEVOTHROID) 75 MCG tablet   Oral   Take 75 mcg by mouth daily.         Marland Kitchen lisinopril (PRINIVIL,ZESTRIL) 5 MG tablet   Oral   Take 5 mg by mouth daily.         . metFORMIN (GLUCOPHAGE-XR) 500 MG 24 hr tablet   Oral   Take 1 tablet (500 mg total) by mouth daily with breakfast.   30 tablet   0   . mupirocin ointment (BACTROBAN) 2 %   Topical   Apply topically.         Marland Kitchen oxyCODONE (OXY IR/ROXICODONE) 5 MG immediate release tablet   Oral   Take 1 tablet (5 mg total) by mouth every 4 (four) hours as needed for breakthrough pain ((for MODERATE breakthrough pain)).   30 tablet   0   . polyethylene glycol (MIRALAX / GLYCOLAX) packet   Oral   Take 17 g by mouth daily as needed for  mild constipation.   14 each   0   . simvastatin (ZOCOR) 20 MG tablet   Oral   Take 20 mg by mouth every evening.         . tamsulosin (FLOMAX) 0.4 MG CAPS capsule   Oral   Take 1 capsule (0.4 mg total) by mouth daily.   30 capsule   3   . traZODone (DESYREL) 50 MG tablet   Oral   Take 50 mg by mouth at bedtime.         . vitamin B-12 (CYANOCOBALAMIN) 1000 MCG tablet   Oral   Take 2,000 mcg by mouth daily.           Allergies Review of patient's allergies indicates no known allergies.  Family History  Problem Relation Age of Onset  . Family history unknown: Yes    Social History Social History  Substance Use Topics  . Smoking status: Never Smoker   . Smokeless tobacco: None  . Alcohol Use: No    Review of Systems Constitutional: No fever/chills Eyes: No visual changes. ENT: No sore throat. Cardiovascular: Denies chest pain. Respiratory: Denies shortness of breath. Gastrointestinal: No abdominal pain.  No nausea, no vomiting.  No diarrhea.  No constipation. Genitourinary: Negative for dysuria. Musculoskeletal: Negative for back pain. Skin: Negative for rash. Neurological: Negative for headaches, focal weakness or numbness.  10-point ROS otherwise negative.  ____________________________________________   PHYSICAL EXAM:  VITAL SIGNS: ED Triage Vitals  Enc Vitals Group     BP 04/11/15 0905 126/64 mmHg     Pulse Rate 04/11/15 0905 91     Resp 04/11/15 0905 18     Temp 04/11/15 0905 98.2 F (36.8 C)     Temp Source 04/11/15 0905 Oral     SpO2 04/11/15 0905 97 %     Weight 04/11/15 0905 119 lb (53.978 kg)     Height 04/11/15 0905  (1.626 m)     Head Cir --      Peak Flow --      Pain Score --      Pain Loc --      Pain Edu? --      Excl. in GC? --     Constitutional: Alert and oriented. Well appearing and in no acute distress. Eyes: Conjunctivae are normal. PERRL. EOMI. Head: Atraumatic. Nose: No  congestion/rhinnorhea. Mouth/Throat: Mucous membranes are  moist.  Oropharynx non-erythematous. Neck: No stridor.   Cardiovascular: Normal rate, regular rhythm. Grossly normal heart sounds.  Good peripheral circulation. Respiratory: Normal respiratory effort.  No retractions. Lungs CTAB. Gastrointestinal: Soft and nontender. No distention. No abdominal bruits. No CVA tenderness. Musculoskeletal: No lower extremity edema.  No joint effusions. Neurologic:  Normal speech and language. No gross focal neurologic deficits are appreciated. No gait instability. Skin:  There is a left foot ulcer/wound to the heel which is 3 x 4 cm and oval shaped. It is tender to touch with a small wheal of erythema about 1 cm surrounding it.  There is no draining pus at this time. It is tender to touch. The foot is warm and without palpable pulses. The patient has full range of motion of the heel as well as the toes. No crepitus. Psychiatric: Mood and affect are normal. Speech and behavior are normal.  ____________________________________________   LABS (all labs ordered are listed, but only abnormal results are displayed)  Labs Reviewed  CBC WITH DIFFERENTIAL/PLATELET  BASIC METABOLIC PANEL   ____________________________________________  EKG   ____________________________________________  RADIOLOGY  Pending for x-ray ____________________________________________   PROCEDURES    ____________________________________________   INITIAL IMPRESSION / ASSESSMENT AND PLAN / ED COURSE  Pertinent labs & imaging results that were available during my care of the patient were reviewed by me and considered in my medical decision making (see chart for details).  ----------------------------------------- 9:39 AM on 04/11/2015 -----------------------------------------  Patient and caretaker aware of the plan for admission and debridement. Pending labs as well as imaging at this time. Will follow. Signed out to  Dr. Sherryll BurgerShah of the medicine service for admission. ____________________________________________   FINAL CLINICAL IMPRESSION(S) / ED DIAGNOSES  Nonhealing, necrotic left foot ulcer.    Myrna Blazeravid Matthew Azyiah Bo, MD 04/11/15 0940  Myrna Blazeravid Matthew Celese Banner, MD 04/11/15 [phone]

## 2015-04-11 NOTE — Progress Notes (Signed)
Notified night shift RN. That Vancomycin was not administered due to IV unasyn running and medications incompatible. Pharmacy did not want medication to be rescheduled and said to give med as close to scheduled time as possible.

## 2015-04-11 NOTE — Progress Notes (Addendum)
Inpatient Diabetes Program Recommendations  AACE/ADA: New Consensus Statement on Inpatient Glycemic Control (2015)  Target Ranges:  Prepandial:   less than 140 mg/dL      Peak postprandial:   less than 180 mg/dL (1-2 hours)      Critically ill patients:  140 - 180 mg/dL   Review of Glycemic Control  Diabetes history: Type 2 Outpatient Diabetes medications: Metformin 500mg  qam, Levemir 20 units qhs. Novolog by sliding scale tid Current orders for Inpatient glycemic control: Novolog 0-9units tid, Novolog 0-5 units qhs  Inpatient Diabetes Program Recommendations: consider starting basal insulin, half home dose- consider starting Levemir 10 units qhs starting tonight. Recommend giving it tonight,  even if it is delayed because of surgery- ideal blood sugar control will aid in healing.   Gloria RacerJulie Tra Wilemon, RN, BA, MHA, CDE Diabetes Coordinator Inpatient Diabetes Program  [phone] (Team Pager) [phone] [facility] Office) 04/11/2015 2:38 PM

## 2015-04-11 NOTE — Progress Notes (Signed)
ANTIBIOTIC CONSULT NOTE - INITIAL  Pharmacy Consult for Vancomycin and Unasyn  Indication: wound infection and cellulitis   No Known Allergies  Patient Measurements: Height: 5\' 4"  (162.6 cm) Weight: 119 lb (53.978 kg) IBW/kg (Calculated) : 54.7 Adjusted Body Weight:   Vital Signs: Temp: 98.2 F (36.8 C) (12/02 0905) Temp Source: Oral (12/02 0905) BP: 134/72 mmHg (12/02 1000) Pulse Rate: 86 (12/02 1000) Intake/Output from previous day:   Intake/Output from this shift:    Labs:  Recent Labs  04/11/15 0927  WBC 7.9  HGB 11.2*  PLT 360  CREATININE 0.73   Estimated Creatinine Clearance: 43.8 mL/min (by C-G formula based on Cr of 0.73). No results for input(s): VANCOTROUGH, VANCOPEAK, VANCORANDOM, GENTTROUGH, GENTPEAK, GENTRANDOM, TOBRATROUGH, TOBRAPEAK, TOBRARND, AMIKACINPEAK, AMIKACINTROU, AMIKACIN in the last 72 hours.   Microbiology: No results found for this or any previous visit (from the past 720 hour(s)).  Medical History: Past Medical History  Diagnosis Date  . Alzheimer's dementia   . Diabetes type 2, controlled ([facility])   . Hypothyroidism   . Vision impairment   . Hearing impairment   . Hypertension   . Unsteady gait     Medications:   (Not in a hospital admission) Scheduled:  . insulin aspart  0-5 Units Subcutaneous QHS  . insulin aspart  0-9 Units Subcutaneous TID WC  . vancomycin  750 mg Intravenous STAT  . vancomycin  750 mg Intravenous Q18H   Assessment: Pharmacy consulted to dose and monitor Vancomycin and Unasyn in this patient being treated for wound infection of the left heel which is gangrenous and draining pus. Patient previously on doxycyline.   Kel (hr-1): 0.041 Half-life (hrs): 16.91 Vd (liters): 37.80 (factor used: 0.7 L/kg)  Goal of Therapy:  Vancomycin trough level 15-20 mcg/ml  Plan:  Follow up culture results   Will give Vancomycin 750 mg IV x 1 then will start Vancomycin 750 mg IV q18 hours @ 18:00. Will order Vancomycin  trough level @ 23:30 on 12/04.   Mykaylah Ballman D 04/11/2015,10:43 AM

## 2015-04-11 NOTE — Progress Notes (Signed)
Spoke to Dr. Alberteen Spindleline Patient will not be having procedure today, will be done tomorrow. Pt may have carb modified diet and will be NPO after midnight.
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
## 2015-04-11 NOTE — Consult Note (Signed)
Reason for Consult: Ulceration on the left heel with several month history Referring Physician: [facility]  [name] is an 81 y.o. female.  HPI: [name] is an 82 year old female with a history of diabetes and neuropathy. Has had an ulcer on her left heel being managed by the wound care center over the past 2-3 months area the ulcer progressed to a full-thickness level with purulent drainage and was referred over to the emergency department for evaluation and possible admission. Patient states the heel has been very painful lately.  Past Medical History  Diagnosis Date  . Alzheimer's dementia   . Diabetes type 2, controlled ([place])   . Hypothyroidism   . Vision impairment   . Hearing impairment   . Hypertension   . Unsteady gait     Past Surgical History  Procedure Laterality Date  . No past surgeries    . Hip arthroplasty Right 01/08/2015    Procedure: ARTHROPLASTY BIPOLAR HIP (HEMIARTHROPLASTY);  Surgeon: Thornton Park, MD;  Location: [facility] ORS;  Service: Orthopedics;  Laterality: Right;    Family History  Problem Relation Age of Onset  . Family history unknown: Yes    Social History:  reports that she has never smoked. She does not have any smokeless tobacco history on file. She reports that she does not drink alcohol or use illicit drugs.  Allergies: No Known Allergies  Medications: I have reviewed the patient's current medications.  Results for orders placed or performed during the hospital encounter of 04/11/15 (from the past 48 hour(s))  CBC with Differential     Status: Abnormal   Collection Time: 04/11/15  9:27 AM  Result Value Ref Range   WBC 7.9 3.6 - 11.0 K/uL   RBC 4.21 3.80 - 5.20 MIL/uL   Hemoglobin 11.2 (L) 12.0 - 16.0 g/dL   HCT 34.6 (L) 35.0 - 47.0 %   MCV 82.1 80.0 - 100.0 fL   MCH 26.6 26.0 - 34.0 pg   MCHC 32.4 32.0 - 36.0 g/dL   RDW 15.2 (H) 11.5 - 14.5 %   Platelets 360 150 - 440 K/uL   Neutrophils Relative % 71 %   Neutro Abs 5.6 1.4 - 6.5 K/uL   Lymphocytes Relative 21 %   Lymphs Abs 1.6 1.0 - 3.6 K/uL   Monocytes Relative 6 %   Monocytes Absolute 0.5 0.2 - 0.9 K/uL   Eosinophils Relative 1 %   Eosinophils Absolute 0.1 0 - 0.7 K/uL   Basophils Relative 1 %   Basophils Absolute 0.1 0 - 0.1 K/uL  Basic metabolic panel     Status: Abnormal   Collection Time: 04/11/15  9:27 AM  Result Value Ref Range   Sodium 137 135 - 145 mmol/L   Potassium 4.7 3.5 - 5.1 mmol/L   Chloride 101 101 - 111 mmol/L   CO2 26 22 - 32 mmol/L   Glucose, Bld 198 (H) 65 - 99 mg/dL   BUN 20 6 - 20 mg/dL   Creatinine, Ser 0.73 0.44 - 1.00 mg/dL   Calcium 9.3 8.9 - 10.3 mg/dL   GFR calc non Af Amer >60 >60 mL/min   GFR calc Af Amer >60 >60 mL/min    Comment: (NOTE) The eGFR has been calculated using the CKD EPI equation. This calculation has not been validated in all clinical situations. eGFR's persistently <60 mL/min signify possible Chronic Kidney Disease.    Anion gap 10 5 - 15  Glucose, capillary     Status:  Abnormal   Collection Time: 04/11/15 11:58 AM  Result Value Ref Range   Glucose-Capillary 140 (H) 65 - 99 mg/dL   Comment 1 Notify RN     Dg Foot Complete Left  04/11/2015  CLINICAL DATA:  Soft tissue infection calcaneal region. Diabetes mellitus. EXAM: LEFT FOOT - COMPLETE 3+ VIEW COMPARISON:  None. FINDINGS: Frontal, oblique, and lateral views were obtained. There is soft tissue irregularity posterior to the calcaneus consistent with cellulitis. There is no frank bony destruction. Bones are osteoporotic. There is no acute fracture or dislocation. There is moderate osteoarthritic change in all MTP, PIP, and DIP joints. There is extensive arterial vascular calcifications in the foot region. IMPRESSION: Soft tissue prominence and thickening posterior to the calcaneus, likely due to cellulitis. No erosive change or bony destruction is appreciable. Bones are osteoporotic. Multilevel osteoarthritic change. Extensive  arterial vascular calcification. No acute fracture or dislocation. If there remains concern for potential osteomyelitis, either nuclear medicine three-phase bone scan or MR could be helpful for further assessment. Electronically Signed   By: Lowella Grip III M.D.   On: 04/11/2015 10:19    Review of Systems  Constitutional: Positive for weight loss.  HENT: Negative.   Eyes: Negative.   Respiratory: Negative.   Cardiovascular: Negative.   Gastrointestinal: Negative.   Genitourinary: Negative.   Musculoskeletal: Negative.   Skin:       Painful and draining open sore on her left heel  Neurological: Positive for weakness.       Relates some paresthesias and burning sensations in the feet when someone touches them  Endo/Heme/Allergies: Negative.   Psychiatric/Behavioral: Negative.    Blood pressure 120/62, pulse 86, temperature 98.2 F (36.8 C), temperature source Oral, resp. rate 18, height _0  (1.6 m), weight 53.326 kg (117 lb 9 oz), SpO2 100 %. Physical Exam  Cardiovascular:  DP and PT pulses are not clearly palpable bilateral.  Musculoskeletal:  Adequate range of motion. Diminished muscle mass and tone  Neurological:  Loss of protective threshold monofilament wire in the foot and toes.  Skin:  The skin is warm dry and somewhat atrophic. Absent hair growth. A large eschar on the posterior aspect of the left heel with full thickness underlying ulceration with some purulent drainage and necrotic debris. Measures approximately 4 cm diameter. Minimal surrounding cellulitis    Assessment/Plan: Assessment: Full-thickness ulceration left heel. Diabetes with associated neuropathy. Peripheral vascular disease  Plan: The patient has been held nothing by mouth so we will plan for debridement of the of the ulcerative area on the back of her left heel this evening. Obtain a consent form for debridement of the left heel. Discussed with the patient possible risks and complications of the  procedure. Unsure of the patient's complete understanding of our above conversation. We will plan for debridement later this evening  Maurisa Tesmer W. 04/11/2015, 2:25 PM

## 2015-04-11 NOTE — Progress Notes (Signed)
Recieved contact number from Care coordinator MadisonJanice. [phone])  Attempted again to contact pt daughter was able to reach voicemail and [facility] a [facility]. Awaiting return of call. Continue to assess.

## 2015-04-11 NOTE — ED Notes (Signed)
Brought in by family care for possible wound infection

## 2015-04-12 ENCOUNTER — Inpatient Hospital Stay: Payer: Medicare Other | Admitting: Anesthesiology

## 2015-04-12 ENCOUNTER — Encounter
Admission: EM | Disposition: A | Discharge status: Skilled Nursing Facility | Payer: Self-pay | Source: Home / Self Care | Attending: Internal Medicine

## 2015-04-12 ENCOUNTER — Encounter: Payer: Self-pay | Admitting: Anesthesiology

## 2015-04-12 DIAGNOSIS — L8990 Pressure ulcer of unspecified site, unspecified stage: Secondary | ICD-10-CM | POA: Insufficient documentation

## 2015-04-12 HISTORY — PX: IRRIGATION AND DEBRIDEMENT FOOT: SHX6602

## 2015-04-12 LAB — GLUCOSE, CAPILLARY
GLUCOSE-CAPILLARY: 123 mg/dL — AB (ref 65–99)
Glucose-Capillary: 107 mg/dL — ABNORMAL HIGH (ref 65–99)
Glucose-Capillary: 193 mg/dL — ABNORMAL HIGH (ref 65–99)
Glucose-Capillary: 175 mg/dL — ABNORMAL HIGH (ref 65–99)

## 2015-04-12 LAB — BASIC METABOLIC PANEL
Anion gap: 7 (ref 5–15)
BUN: 15 mg/dL (ref 6–20)
CALCIUM: 8.9 mg/dL (ref 8.9–10.3)
CO2: 27 mmol/L (ref 22–32)
CREATININE: 0.69 mg/dL (ref 0.44–1.00)
Chloride: 104 mmol/L (ref 101–111)
GFR calc Af Amer: >60 mL/min (ref >60)
Glucose, Bld: 136 mg/dL — ABNORMAL HIGH (ref 65–99)
Potassium: 4.5 mmol/L (ref 3.5–5.1)
SODIUM: 138 mmol/L (ref 135–145)

## 2015-04-12 LAB — CBC
HCT: 33.1 % — ABNORMAL LOW (ref 35.0–47.0)
Hemoglobin: 11.1 g/dL — ABNORMAL LOW (ref 12.0–16.0)
MCH: 27.3 pg (ref 26.0–34.0)
MCHC: 33.4 g/dL (ref 32.0–36.0)
MCV: 81.7 fL (ref 80.0–100.0)
PLATELETS: 304 10*3/uL (ref 150–440)
RBC: 4.05 MIL/uL (ref 3.80–5.20)
RDW: 15.5 % — AB (ref 11.5–14.5)
WBC: 5.3 10*3/uL (ref 3.6–11.0)

## 2015-04-12 LAB — MRSA PCR SCREENING: MRSA BY PCR: NEGATIVE

## 2015-04-12 SURGERY — IRRIGATION AND DEBRIDEMENT FOOT
Anesthesia: General | Laterality: Left

## 2015-04-12 MED ORDER — GLUCERNA SHAKE PO LIQD
237.0000 mL | Freq: Three times a day (TID) | ORAL | Status: DC
  Administered 2015-04-12 – 2015-04-14 (×7): 237 mL via ORAL

## 2015-04-12 MED ORDER — LABETALOL HCL 5 MG/ML IV SOLN
INTRAVENOUS | Status: DC | PRN
  Administered 2015-04-12: 5 mg via INTRAVENOUS

## 2015-04-12 MED ORDER — ONDANSETRON HCL 4 MG/2ML IJ SOLN: 4.0000 mg | Freq: Once | INTRAMUSCULAR | Status: DC | PRN

## 2015-04-12 MED ORDER — VANCOMYCIN HCL 1000 MG IV SOLR
INTRAVENOUS | Status: DC | PRN
  Administered 2015-04-12: 1000 mg

## 2015-04-12 MED ORDER — BUPIVACAINE HCL (PF) 0.5 % IJ SOLN
INTRAMUSCULAR | Status: DC | PRN
  Administered 2015-04-12: 10 mL

## 2015-04-12 MED ORDER — GLUCERNA SHAKE PO LIQD: 237.0000 mL | Freq: Two times a day (BID) | ORAL | Status: DC

## 2015-04-12 MED ORDER — ONDANSETRON HCL 4 MG/2ML IJ SOLN
INTRAMUSCULAR | Status: DC | PRN
Start: 2015-04-12 — End: 2015-04-12
  Administered 2015-04-12: 4 mg via INTRAVENOUS

## 2015-04-12 MED ORDER — MORPHINE SULFATE (PF) 2 MG/ML IV SOLN
2.0000 mg | INTRAVENOUS | Status: DC | PRN
  Administered 2015-04-12 – 2015-04-14 (×2): 2 mg via INTRAVENOUS
  Filled 2015-04-12 (×2): qty 1

## 2015-04-12 MED ORDER — PROPOFOL 10 MG/ML IV BOLUS
INTRAVENOUS | Status: DC | PRN
  Administered 2015-04-12: 100 mg via INTRAVENOUS

## 2015-04-12 MED ORDER — FENTANYL CITRATE (PF) 100 MCG/2ML IJ SOLN
INTRAMUSCULAR | Status: DC | PRN
  Administered 2015-04-12: 100 ug via INTRAVENOUS

## 2015-04-12 MED ORDER — HYDROCODONE-ACETAMINOPHEN 5-325 MG PO TABS
1.0000 | ORAL_TABLET | ORAL | Status: DC | PRN
  Administered 2015-04-12: 1 via ORAL
  Filled 2015-04-12: qty 1

## 2015-04-12 MED ORDER — SODIUM CHLORIDE 0.9 % IV SOLN: 250.0000 mL | INTRAVENOUS | Status: DC | PRN

## 2015-04-12 MED ORDER — VANCOMYCIN HCL 1000 MG IV SOLR
INTRAVENOUS | Status: AC
  Filled 2015-04-12: qty 1000

## 2015-04-12 MED ORDER — BUPIVACAINE HCL (PF) 0.5 % IJ SOLN
INTRAMUSCULAR | Status: AC
  Filled 2015-04-12: qty 30

## 2015-04-12 MED ORDER — EPHEDRINE SULFATE 50 MG/ML IJ SOLN
INTRAMUSCULAR | Status: DC | PRN
  Administered 2015-04-12 (×2): 10 mg via INTRAVENOUS

## 2015-04-12 MED ORDER — FENTANYL CITRATE (PF) 100 MCG/2ML IJ SOLN: 25.0000 ug | INTRAMUSCULAR | Status: DC | PRN

## 2015-04-12 MED ORDER — SODIUM CHLORIDE 0.9 % IJ SOLN: 3.0000 mL | INTRAMUSCULAR | Status: DC | PRN

## 2015-04-12 MED ORDER — SODIUM CHLORIDE 0.9 % IJ SOLN
3.0000 mL | Freq: Two times a day (BID) | INTRAMUSCULAR | Status: DC
  Administered 2015-04-12: 3 mL via INTRAVENOUS

## 2015-04-12 MED ORDER — MIDAZOLAM HCL 2 MG/2ML IJ SOLN
INTRAMUSCULAR | Status: DC | PRN
  Administered 2015-04-12: 2 mg via INTRAVENOUS

## 2015-04-12 MED ORDER — TRAZODONE HCL 50 MG PO TABS
50.0000 mg | ORAL_TABLET | Freq: Once | ORAL | Status: AC
  Administered 2015-04-13: 50 mg via ORAL

## 2015-04-12 SURGICAL SUPPLY — 49 items
BAG COUNTER SPONGE EZ (MISCELLANEOUS) ×2 IMPLANT
BANDAGE ELASTIC 4 LF NS (GAUZE/BANDAGES/DRESSINGS) ×3 IMPLANT
BLADE OSCILLATING/SAGITTAL (BLADE)
BLADE SURG 15 STRL LF DISP TIS (BLADE) ×1 IMPLANT
BLADE SURG 15 STRL SS (BLADE) ×2
BLADE SW THK.38XMED LNG THN (BLADE) IMPLANT
BNDG ESMARK 4X12 TAN STRL LF (GAUZE/BANDAGES/DRESSINGS) ×3 IMPLANT
BNDG GAUZE 4.5X4.1 6PLY STRL (MISCELLANEOUS) ×3 IMPLANT
CANISTER SUCT 1200ML W/VALVE (MISCELLANEOUS) ×3 IMPLANT
COUNTER SPONGE BAG EZ (MISCELLANEOUS) ×1
CUFF TOURN 18 STER (MISCELLANEOUS) IMPLANT
CUFF TOURN DUAL PL 12 NO SLV (MISCELLANEOUS) ×3 IMPLANT
DRAPE FLUOR MINI C-ARM 54X84 (DRAPES) IMPLANT
DRSG MEPITEL 4X7.2 (GAUZE/BANDAGES/DRESSINGS) ×3 IMPLANT
DURAPREP 26ML APPLICATOR (WOUND CARE) ×3 IMPLANT
GAUZE FLUFF 18X24 1PLY STRL (GAUZE/BANDAGES/DRESSINGS) IMPLANT
GAUZE PETRO XEROFOAM 1X8 (MISCELLANEOUS) ×3 IMPLANT
GAUZE SPONGE 4X4 12PLY STRL (GAUZE/BANDAGES/DRESSINGS) ×3 IMPLANT
GAUZE STRETCH 2X75IN STRL (MISCELLANEOUS) IMPLANT
GLOVE BIO SURGEON STRL SZ7.5 (GLOVE) ×9 IMPLANT
GLOVE INDICATOR 8.0 STRL GRN (GLOVE) ×3 IMPLANT
GOWN STRL REUS W/ TWL LRG LVL3 (GOWN DISPOSABLE) ×2 IMPLANT
GOWN STRL REUS W/TWL LRG LVL3 (GOWN DISPOSABLE) ×4
HANDPIECE VERSAJET DEBRIDEMENT (MISCELLANEOUS) ×3 IMPLANT
KIT STIMULAN RAPID CURE 5CC (Orthopedic Implant) ×3 IMPLANT
LABEL OR SOLS (LABEL) ×3 IMPLANT
NEEDLE FILTER BLUNT 18X 1/2SAF (NEEDLE) ×2
NEEDLE FILTER BLUNT 18X1 1/2 (NEEDLE) ×1 IMPLANT
NEEDLE HYPO 25X1 1.5 SAFETY (NEEDLE) ×9 IMPLANT
NS IRRIG 500ML POUR BTL (IV SOLUTION) ×3 IMPLANT
PACK EXTREMITY ARMC (MISCELLANEOUS) ×3 IMPLANT
PAD ABD DERMACEA PRESS 5X9 (GAUZE/BANDAGES/DRESSINGS) ×6 IMPLANT
PAD GROUND ADULT SPLIT (MISCELLANEOUS) ×3 IMPLANT
PENCIL ELECTRO HAND CTR (MISCELLANEOUS) IMPLANT
RASP SM TEAR CROSS CUT (RASP) IMPLANT
SOL PREP PVP 2OZ (MISCELLANEOUS) ×3
SOLUTION PREP PVP 2OZ (MISCELLANEOUS) ×1 IMPLANT
STOCKINETTE STRL 4IN 9604848 (GAUZE/BANDAGES/DRESSINGS) ×3 IMPLANT
STOCKINETTE STRL 6IN 960660 (GAUZE/BANDAGES/DRESSINGS) IMPLANT
STRAP SAFETY BODY (MISCELLANEOUS) ×3 IMPLANT
SUT ETHILON 4-0 (SUTURE) ×2
SUT ETHILON 4-0 FS2 18XMFL BLK (SUTURE) ×1
SUT VIC AB 3-0 SH 27 (SUTURE)
SUT VIC AB 3-0 SH 27X BRD (SUTURE) IMPLANT
SUT VIC AB 4-0 FS2 27 (SUTURE) IMPLANT
SUTURE ETHLN 4-0 FS2 18XMF BLK (SUTURE) ×1 IMPLANT
SWAB CULTURE AMIES ANAERIB BLU (MISCELLANEOUS) ×3 IMPLANT
SYR 3ML LL SCALE MARK (SYRINGE) IMPLANT
SYRINGE 10CC LL (SYRINGE) ×6 IMPLANT

## 2015-04-12 NOTE — Progress Notes (Signed)
Made password with daughter who is next of kin. Patient is only alert and oriented x2.

## 2015-04-12 NOTE — H&P (View-Only) (Signed)
Reason for Consult: Ulceration on the left heel with several month history Referring Physician: [facility]  [name] is an 81 y.o. female.  HPI: [name] is an 81-year-old female with a history of diabetes and neuropathy. Has had an ulcer on her left heel being managed by the wound care center over the past 2-3 months area the ulcer progressed to a full-thickness level with purulent drainage and was referred over to the emergency department for evaluation and possible admission. Patient states the heel has been very painful lately.  Past Medical History  Diagnosis Date  . Alzheimer's dementia   . Diabetes type 2, controlled ([facility])   . Hypothyroidism   . Vision impairment   . Hearing impairment   . Hypertension   . Unsteady gait     Past Surgical History  Procedure Laterality Date  . No past surgeries    . Hip arthroplasty Right 01/08/2015    Procedure: ARTHROPLASTY BIPOLAR HIP (HEMIARTHROPLASTY);  Surgeon: Kevin Krasinski, MD;  Location: [facility] ORS;  Service: Orthopedics;  Laterality: Right;    Family History  Problem Relation Age of Onset  . Family history unknown: Yes    Social History:  reports that she has never smoked. She does not have any smokeless tobacco history on file. She reports that she does not drink alcohol or use illicit drugs.  Allergies: No Known Allergies  Medications: I have reviewed the patient's current medications.  Results for orders placed or performed during the hospital encounter of 04/11/15 (from the past 48 hour(s))  CBC with Differential     Status: Abnormal   Collection Time: 04/11/15  9:27 AM  Result Value Ref Range   WBC 7.9 3.6 - 11.0 K/uL   RBC 4.21 3.80 - 5.20 MIL/uL   Hemoglobin 11.2 (L) 12.0 - 16.0 g/dL   HCT 34.6 (L) 35.0 - 47.0 %   MCV 82.1 80.0 - 100.0 fL   MCH 26.6 26.0 - 34.0 pg   MCHC 32.4 32.0 - 36.0 g/dL   RDW 15.2 (H) 11.5 - 14.5 %   Platelets 360 150 - 440 K/uL   Neutrophils Relative % 71 %   Neutro Abs 5.6 1.4 - 6.5 K/uL   Lymphocytes Relative 21 %   Lymphs Abs 1.6 1.0 - 3.6 K/uL   Monocytes Relative 6 %   Monocytes Absolute 0.5 0.2 - 0.9 K/uL   Eosinophils Relative 1 %   Eosinophils Absolute 0.1 0 - 0.7 K/uL   Basophils Relative 1 %   Basophils Absolute 0.1 0 - 0.1 K/uL  Basic metabolic panel     Status: Abnormal   Collection Time: 04/11/15  9:27 AM  Result Value Ref Range   Sodium 137 135 - 145 mmol/L   Potassium 4.7 3.5 - 5.1 mmol/L   Chloride 101 101 - 111 mmol/L   CO2 26 22 - 32 mmol/L   Glucose, Bld 198 (H) 65 - 99 mg/dL   BUN 20 6 - 20 mg/dL   Creatinine, Ser 0.73 0.44 - 1.00 mg/dL   Calcium 9.3 8.9 - 10.3 mg/dL   GFR calc non Af Amer >60 >60 mL/min   GFR calc Af Amer >60 >60 mL/min    Comment: (NOTE) The eGFR has been calculated using the CKD EPI equation. This calculation has not been validated in all clinical situations. eGFR's persistently <60 mL/min signify possible Chronic Kidney Disease.    Anion gap 10 5 - 15  Glucose, capillary     Status:   Abnormal   Collection Time: 04/11/15 11:58 AM  Result Value Ref Range   Glucose-Capillary 140 (H) 65 - 99 mg/dL   Comment 1 Notify RN     Dg Foot Complete Left  04/11/2015  CLINICAL DATA:  Soft tissue infection calcaneal region. Diabetes mellitus. EXAM: LEFT FOOT - COMPLETE 3+ VIEW COMPARISON:  None. FINDINGS: Frontal, oblique, and lateral views were obtained. There is soft tissue irregularity posterior to the calcaneus consistent with cellulitis. There is no frank bony destruction. Bones are osteoporotic. There is no acute fracture or dislocation. There is moderate osteoarthritic change in all MTP, PIP, and DIP joints. There is extensive arterial vascular calcifications in the foot region. IMPRESSION: Soft tissue prominence and thickening posterior to the calcaneus, likely due to cellulitis. No erosive change or bony destruction is appreciable. Bones are osteoporotic. Multilevel osteoarthritic change. Extensive  arterial vascular calcification. No acute fracture or dislocation. If there remains concern for potential osteomyelitis, either nuclear medicine three-phase bone scan or MR could be helpful for further assessment. Electronically Signed   By: William  Woodruff III M.D.   On: 04/11/2015 10:19    Review of Systems  Constitutional: Positive for weight loss.  HENT: Negative.   Eyes: Negative.   Respiratory: Negative.   Cardiovascular: Negative.   Gastrointestinal: Negative.   Genitourinary: Negative.   Musculoskeletal: Negative.   Skin:       Painful and draining open sore on her left heel  Neurological: Positive for weakness.       Relates some paresthesias and burning sensations in the feet when someone touches them  Endo/Heme/Allergies: Negative.   Psychiatric/Behavioral: Negative.    Blood pressure 120/62, pulse 86, temperature 98.2 F (36.8 C), temperature source Oral, resp. rate 18, height 5' 3" (1.6 m), weight 53.326 kg (117 lb 9 oz), SpO2 100 %. Physical Exam  Cardiovascular:  DP and PT pulses are not clearly palpable bilateral.  Musculoskeletal:  Adequate range of motion. Diminished muscle mass and tone  Neurological:  Loss of protective threshold monofilament wire in the foot and toes.  Skin:  The skin is warm dry and somewhat atrophic. Absent hair growth. A large eschar on the posterior aspect of the left heel with full thickness underlying ulceration with some purulent drainage and necrotic debris. Measures approximately 4 cm diameter. Minimal surrounding cellulitis    Assessment/Plan: Assessment: Full-thickness ulceration left heel. Diabetes with associated neuropathy. Peripheral vascular disease  Plan: The patient has been held nothing by mouth so we will plan for debridement of the of the ulcerative area on the back of her left heel this evening. Obtain a consent form for debridement of the left heel. Discussed with the patient possible risks and complications of the  procedure. Unsure of the patient's complete understanding of our above conversation. We will plan for debridement later this evening  Ervan Heber W. 04/11/2015, 2:25 PM      

## 2015-04-12 NOTE — Progress Notes (Signed)
Pt. Complained of sever pain even after pain meds admin. She also complained that she cannot sleep. MD notified and orders placed for Trazadone 50mg .

## 2015-04-12 NOTE — Progress Notes (Signed)
[facility] [place] [facility]                                                                                                                                                                                            Patient Demographics   Katelind Pytel, is a 81 y.o. female, DOB - December 15, 1927, LKG:[id number]  Admit date - 04/11/2015   Admitting Physician Alford Highland, MD  Outpatient Primary MD for the patient is CAMPBELL, MARGARET P, FNP   LOS - 1  Subjective: Patient underwent incision and drainage of the left heel ulceration and abscess. Patient denies any significant pain. No chest pain no shortness of breath     Review of Systems:   CONSTITUTIONAL: No documented fever. No fatigue, weakness. No weight gain, no weight loss.  EYES: No blurry or double vision.  ENT: No tinnitus. No postnasal drip. No redness of the oropharynx.  RESPIRATORY: No cough, no wheeze, no hemoptysis. No dyspnea.  CARDIOVASCULAR: No chest pain. No orthopnea. No palpitations. No syncope.  GASTROINTESTINAL: No nausea, no vomiting or diarrhea. No abdominal pain. No melena or hematochezia.  GENITOURINARY: No dysuria or hematuria.  ENDOCRINE: No polyuria or nocturia. No heat or cold intolerance.  HEMATOLOGY: No anemia. No bruising. No bleeding.  INTEGUMENTARY: No rashes. No lesions.  MUSCULOSKELETAL: No arthritis. No swelling. No gout.  NEUROLOGIC: No numbness, tingling, or ataxia. No seizure-type activity.  PSYCHIATRIC: No anxiety. No insomnia. No ADD.    Vitals:   Filed Vitals:   04/12/15 1135 04/12/15 1151 04/12/15 1154 04/12/15 1227  BP: 132/61  133/63 132/52  Pulse: 80  79 80  Temp:  97.8 F (36.6 C)  97.3 F (36.3 C)  TempSrc:    Oral  Resp: Height:      Weight:      SpO2: 100%  96% 100%    Wt Readings from Last 3 Encounters:  04/11/15 53.326 kg (117 lb 9 oz)  02/08/15 54 kg (119 lb 0.8 oz)  01/07/15 63.095 kg (139 lb 1.6 oz)      Intake/Output Summary (Last 24 hours) at 04/12/15 1340 Last data filed at 04/12/15 1219  Gross per 24 hour  Intake 929.17 ml  Output    565 ml  Net 364.17 ml    Physical Exam:   GENERAL: Pleasant-appearing in no apparent distress.  HEAD, EYES, EARS, NOSE AND THROAT: Atraumatic, normocephalic. Extraocular muscles are intact. Pupils equal and reactive to light. Sclerae anicteric. No conjunctival injection. No oro-pharyngeal erythema.  NECK: Supple. There is no jugular venous distention. No bruits, no lymphadenopathy, no thyromegaly.  HEART: Regular rate and rhythm,. No murmurs, no rubs, no clicks.  LUNGS: Clear to auscultation bilaterally. No rales or rhonchi. No wheezes.  ABDOMEN: Soft, flat, nontender, nondistended. Has good bowel sounds. No hepatosplenomegaly appreciated.  EXTREMITIES: No evidence of any cyanosis, clubbing, or peripheral edema.  Patient has dressing on the left foot  NEUROLOGIC: The patient is alert, awake, and oriented x3 with no focal motor or sensory deficits appreciated bilaterally.  SKIN: Moist and warm with no rashes appreciated.  Psych: Not anxious, depressed LN: No inguinal LN enlargement    Antibiotics   Anti-infectives    Start     Dose/Rate Route Frequency Ordered Stop   04/12/15 1100  vancomycin (VANCOCIN) powder  Status:  Discontinued       As needed 04/12/15 1100 04/12/15 1116   04/11/15 1800  vancomycin (VANCOCIN) IVPB 750 mg/150 ml premix     750 mg 150 mL/hr over 60 Minutes Intravenous Every 18 hours 04/11/15 1040     04/11/15 1100  ampicillin-sulbactam (UNASYN) 1.5 g in sodium chloride 0.9 % 50 mL IVPB     1.5 g 100 mL/hr over 30 Minutes Intravenous Every 6 hours 04/11/15 1028     04/11/15 1045  vancomycin (VANCOCIN) IVPB 750 mg/150 ml premix     750 mg 150 mL/hr over 60 Minutes Intravenous STAT 04/11/15 1038 04/11/15 1406      Medications   Scheduled Meds: . ampicillin-sulbactam (UNASYN) IV  1.5 g Intravenous Q6H  . ferrous  sulfate  325 mg Oral BID WC  . insulin aspart  0-5 Units Subcutaneous QHS  . insulin aspart  0-9 Units Subcutaneous TID WC  . levothyroxine  75 mcg Oral Daily  . lisinopril  5 mg Oral Daily  . senna-docusate  2 tablet Oral BID  . sertraline  50 mg Oral Daily  . simvastatin  20 mg Oral QPM  . sodium chloride  3 mL Intravenous Q12H  . tamsulosin  0.4 mg Oral Daily  . traZODone  50 mg Oral QHS  . vancomycin  750 mg Intravenous Q18H   Continuous Infusions: . sodium chloride 50 mL/hr at 04/12/15 1300   PRN Meds:.sodium chloride, acetaminophen **OR** acetaminophen, HYDROcodone-acetaminophen, morphine injection, oxyCODONE, sodium chloride   Data Review:   Micro Results Recent Results (from the past 240 hour(s))  Wound culture     Status: None (Preliminary result)   Collection Time: 04/10/15  7:00 PM  Result Value Ref Range Status   Specimen Description FOOT  Final   Special Requests Normal  Final   Gram Stain PENDING  Incomplete   Culture NO GROWTH < 24 HOURS  Final   Report Status PENDING  Incomplete  MRSA PCR Screening     Status: None   Collection Time: 04/12/15  1:56 AM  Result Value Ref Range Status   MRSA by PCR NEGATIVE NEGATIVE Final    Comment:        The GeneXpert MRSA Assay (FDA approved for NASAL specimens only), is one component of a comprehensive MRSA colonization surveillance program. It is not intended to diagnose MRSA infection nor to guide or monitor treatment for MRSA infections.     Radiology Reports Dg Foot Complete Left  04/11/2015  CLINICAL DATA:  Soft tissue infection calcaneal region. Diabetes mellitus. EXAM: LEFT FOOT - COMPLETE 3+ VIEW COMPARISON:  None. FINDINGS: Frontal, oblique, and lateral views were obtained. There is soft tissue irregularity posterior to the calcaneus consistent with cellulitis. There is no frank bony destruction. Bones are  osteoporotic. There is no acute fracture or dislocation. There is moderate osteoarthritic change in  all MTP, PIP, and DIP joints. There is extensive arterial vascular calcifications in the foot region. IMPRESSION: Soft tissue prominence and thickening posterior to the calcaneus, likely due to cellulitis. No erosive change or bony destruction is appreciable. Bones are osteoporotic. Multilevel osteoarthritic change. Extensive arterial vascular calcification. No acute fracture or dislocation. If there remains concern for potential osteomyelitis, either nuclear medicine three-phase bone scan or MR could be helpful for further assessment. Electronically Signed   By: Bretta Bang III M.D.   On: 04/11/2015 10:19     CBC  Recent Labs Lab 04/11/15 0927 04/12/15 0610  WBC 7.9 5.3  HGB 11.2* 11.1*  HCT 34.6* 33.1*  PLT 360 304  MCV 82.1 81.7  MCH 26.6 27.3  MCHC 32.4 33.4  RDW 15.2* 15.5*  LYMPHSABS 1.6  --   MONOABS 0.5  --   EOSABS 0.1  --   BASOSABS 0.1  --     Chemistries   Recent Labs Lab 04/11/15 0927 04/12/15 0610  NA 137 138  K 4.7 4.5  CL 101 104  CO2 26 27  GLUCOSE 198* 136*  BUN 20 15  CREATININE 0.73 0.69  CALCIUM 9.3 8.9   ------------------------------------------------------------------------------------------------------------------ estimated creatinine clearance is 42.5 mL/min (by C-G formula based on Cr of 0.69). ------------------------------------------------------------------------------------------------------------------  Recent Labs  04/11/15 0927  HGBA1C 6.6*   ------------------------------------------------------------------------------------------------------------------ No results for input(s): CHOL, HDL, LDLCALC, TRIG, CHOLHDL, LDLDIRECT in the last 72 hours. ------------------------------------------------------------------------------------------------------------------ No results for input(s): TSH, T4TOTAL, T3FREE, THYROIDAB in the last 72 hours.  Invalid input(s):  FREET3 ------------------------------------------------------------------------------------------------------------------ No results for input(s): VITAMINB12, FOLATE, FERRITIN, TIBC, IRON, RETICCTPCT in the last 72 hours.  Coagulation profile No results for input(s): INR, PROTIME in the last 168 hours.  No results for input(s): DDIMER in the last 72 hours.  Cardiac Enzymes No results for input(s): CKMB, TROPONINI, MYOGLOBIN in the last 168 hours.  Invalid input(s): CK ------------------------------------------------------------------------------------------------------------------ Invalid input(s): POCBNP    Assessment & Plan   1. Gangrene left heel. Status post incision and drainage await cultures continue Unasyn and vancomycin 2. Type 2 diabetes- sliding-scale, hemoglobin A1c elevated at 6.6 needs diet control 3. Essential hypertension continue lisinopril 4. Hyperlipidemia unspecified continue Zocor 5. Hypothyroidism unspecified continue levothyroxin 6. Alzheimer's dementia without behavioral disturbance     Code Status Orders        Start     Ordered   04/12/15 1216  Full code   Continuous     04/12/15 1215           Consults  none  DVT Prophylaxis heparin  Lab Results  Component Value Date   PLT 304 04/12/2015     Time Spent in minutes     Auburn Bilberry M.D on 04/12/2015 at 1:40 PM  Between 7am to 6pm - Pager - [phone]  After 6pm go to [email or web address] - password [email or web address]  [facility]   Office  [phone]

## 2015-04-12 NOTE — Progress Notes (Signed)
Initial Nutrition Assessment   INTERVENTION:   Meals and Snacks: Cater to patient preferences; will send soup on all lunch and dinner trays Medical Food Supplement Therapy: will recommend Glucerna Shake po TID, each supplement provides 220 kcal and 10 grams of protein   NUTRITION DIAGNOSIS:   Increased nutrient needs related to wound healing as evidenced by estimated needs.  GOAL:   Patient will meet greater than or equal to 90% of their needs  MONITOR:    (Energy Intake, Glucose Profile, Anthropometrics, Digestive System)  REASON FOR ASSESSMENT:   Malnutrition Screening Tool    ASSESSMENT:   Pt admitted with infected heel ulcer, s/p I&D 12/3. Pt out of room this am at procedure and sleeping soundly on visit this afternoon. Pt's lunch tray in room on visit.  Past Medical History  Diagnosis Date  . Alzheimer's dementia   . Diabetes type 2, controlled (HCC)   . Hypothyroidism   . Vision impairment   . Hearing impairment   . Hypertension   . Unsteady gait     Diet Order:  Diet Carb Modified Fluid consistency:: Thin; Room service appropriate?: Yes    Current Nutrition: Pt ate 100% of yogurt and bites of mashed potatoes at lunch today.   Food/Nutrition-Related History: Per [facility] pt with decreased appetite PTA with note from N[facility] pt usually eats soup and crackers.    Scheduled Medications:  . ampicillin-sulbactam (UNASYN) IV  1.5 g Intravenous Q6H  . feeding supplement (GLUCERNA SHAKE)  237 mL Oral BID WC  . ferrous sulfate  325 mg Oral BID WC  . insulin aspart  0-5 Units Subcutaneous QHS  . insulin aspart  0-9 Units Subcutaneous TID WC  . levothyroxine  75 mcg Oral Daily  . lisinopril  5 mg Oral Daily  . senna-docusate  2 tablet Oral BID  . sertraline  50 mg Oral Daily  . simvastatin  20 mg Oral QPM  . sodium chloride  3 mL Intravenous Q12H  . tamsulosin  0.4 mg Oral Daily  . traZODone  50 mg Oral QHS  . vancomycin  750 mg Intravenous Q18H    Continuous  Medications:  . sodium chloride 50 mL/hr at 04/12/15 1300     Electrolyte/Renal Profile and Glucose Profile:   Recent Labs Lab 04/11/15 0927 04/12/15 0610  NA 137 138  K 4.7 4.5  CL 101 104  CO2 26 27  BUN 20 15  CREATININE 0.73 0.69  CALCIUM 9.3 8.9  GLUCOSE 198* 136*   Lab Results  Component Value Date   HGBA1C 6.6* 04/11/2015    Protein Profile: No results for input(s): ALBUMIN in the last 168 hours.  Gastrointestinal Profile: Last BM:  unknown   Nutrition-Focused Physical Exam Findings:  Unable to complete Nutrition-Focused physical exam at this time.    Weight Change: Per [facility] does not know weight trend. Per [facility] weight loss of 16% in 4 months, however only 2% in 2 months.   Skin:   (Unstageable left heel ulcer)  Last BM:  unknown  Height:   Ht Readings from Last 1 Encounters:  04/11/15  (1.6 m)    Weight:   Wt Readings from Last 1 Encounters:  04/11/15 117 lb 9 oz (53.326 kg)   Wt Readings from Last 10 Encounters:  04/11/15 117 lb 9 oz (53.326 kg)  02/08/15 119 lb 0.8 oz (54 kg)  01/07/15 139 lb 1.6 oz (63.095 kg)    BMI:  Body mass index is 20.83 kg/(m^2).  Estimated Nutritional Needs:   Kcal:  BEE: 944kcals, TEE: (IF 1.2-1.4)(AF 1.2)  1360-1568kcals  Protein:  64-80g protein (1.2-1.5g/kg)  Fluid:  1325-154890mL of fluid (25-6030mL/kg)  EDUCATION NEEDS:   Education needs no appropriate at this time   MODERATE Care Level  Leda QuailAllyson Kathlee Barnhardt, RD, LDN Pager [phone]

## 2015-04-12 NOTE — Progress Notes (Signed)
Blood glucose check of 107. Pts arm band did not scan

## 2015-04-12 NOTE — Anesthesia Postprocedure Evaluation (Signed)
Anesthesia Post Note  Patient: [name]  Procedure(s) Performed: Procedure(s) (LRB): IRRIGATION AND DEBRIDEMENT FOOT (Left)  Patient location during evaluation: PACU Anesthesia Type: General Level of consciousness: awake and awake and alert Pain management: pain level controlled Vital Signs Assessment: post-procedure vital signs reviewed and stable Respiratory status: spontaneous breathing Cardiovascular status: blood pressure returned to baseline Anesthetic complications: no    Last Vitals:  Filed Vitals:   04/12/15 1151 04/12/15 1154  BP:  133/63  Pulse:  79  Temp: 36.6 C   Resp:  15    Last Pain:  Filed Vitals:   04/12/15 1155  PainSc: 0-No pain                 Tareva Leske

## 2015-04-12 NOTE — Addendum Note (Signed)
Addendum  created 04/12/15 1342 by Yves DillPaul Kaula Klenke, MD   Modules edited: Clinical Notes   Clinical Notes:  File: [id number]

## 2015-04-12 NOTE — Interval H&P Note (Signed)
History and Physical Interval Note:  04/12/2015 10:15 AM  [name]  has presented today for surgery, with the diagnosis of infected heel left  The various methods of treatment have been discussed with the patient and family. After consideration of risks, benefits and other options for treatment, the patient has consented to  Procedure(s): IRRIGATION AND DEBRIDEMENT FOOT (Left) as a surgical intervention .  The patient's history has been reviewed, patient examined, no change in status, stable for surgery.  I have reviewed the patient's chart and labs.  Questions were answered to the patient's satisfaction.     [name] W.

## 2015-04-12 NOTE — Transfer of Care (Signed)
Immediate Anesthesia Transfer of Care Note  Patient: [name]  Procedure(s) Performed: Procedure(s): IRRIGATION AND DEBRIDEMENT FOOT (Left)  Patient Location: PACU  Anesthesia Type:MAC and General  Level of Consciousness: sedated  Airway & Oxygen Therapy: Patient Spontanous Breathing and Patient connected to face mask oxygen  Post-op Assessment: Report given to RN and Post -op Vital signs reviewed and stable  Post vital signs: Reviewed and stable  Last Vitals:  Filed Vitals:   04/11/15 2115 04/12/15 0918  BP: 138/58 124/66  Pulse: 95 96  Temp: 36.7 C 36.9 C  Resp: 20 18    Complications: No apparent anesthesia complications

## 2015-04-12 NOTE — Anesthesia Preprocedure Evaluation (Addendum)
Anesthesia Evaluation  Patient identified by MRN, date of birth, ID band Patient awake and Patient confused    Reviewed: Allergy & Precautions, NPO status , Patient's Chart, lab work & pertinent test results  Airway Mallampati: II  TM Distance: >3 FB Neck ROM: Limited    Dental  (+) Missing, Chipped   Pulmonary neg pulmonary ROS,    Pulmonary exam normal breath sounds clear to auscultation       Cardiovascular hypertension, Pt. on medications Normal cardiovascular exam     Neuro/Psych PSYCHIATRIC DISORDERS alzheimers dementia...unsteady gait... Very hard of hearing    GI/Hepatic negative GI ROS, Neg liver ROS,   Endo/Other  diabetes, Well Controlled, Type 2, Oral Hypoglycemic AgentsHypothyroidism   Renal/GU negative Renal ROS   Urinary retention    Musculoskeletal Heel ulcer   Abdominal Normal abdominal exam  (+)   Peds negative pediatric ROS (+)  Hematology negative hematology ROS (+)   Anesthesia Other Findings   Reproductive/Obstetrics                           Anesthesia Physical Anesthesia Plan  ASA: III and emergent  Anesthesia Plan: General   Post-op Pain Management:    Induction: Intravenous  Airway Management Planned: LMA  Additional Equipment:   Intra-op Plan:   Post-operative Plan: Extubation in OR  Informed Consent: I have reviewed the patients History and Physical, chart, labs and discussed the procedure including the risks, benefits and alternatives for the proposed anesthesia with the patient or authorized representative who has indicated his/her understanding and acceptance.   Dental Advisory Given and Dental advisory given  Plan Discussed with: Anesthesiologist, CRNA and Surgeon  Anesthesia Plan Comments: (Will go with Surgical/anesthesia consent... [place])      Anesthesia Quick Evaluation

## 2015-04-12 NOTE — Op Note (Signed)
Date of operation: 04/12/2015.  Surgeon: Ricci Barkerodd W Maury Groninger DPM.  Preoperative diagnosis: Full-thickness ulceration with abscess left heel.  Postoperative diagnosis: Same.  Procedure: Incision and drainage left heel ulceration and abscess.  Anesthesia: Gen. LMA.  Hemostasis: None.  Estimated blood loss: 15 cc.  Implants: Stimulan rapid cure antibiotic beads impregnated with vancomycin.  Injectables: 10 cc of 0.5% bupivacaine plain.  Complications: None apparent.  Operative indications: The patient has a several month history of nonhealing ulceration on her left heel. She has been managed outpatient at the wound care center area wound recently has progressed and become significantly infected and referred over to the emergency department for admission and debridement.  Operative procedure: Patient was taken to the operating room and placed on the table in the supine position. Following satisfactory general LMA anesthesia the left foot was prepped and draped in the usual sterile fashion. Attention was then directed to the posterior aspect of the left heel where an approximate 4 cm diameter ulceration and eschar was noted over the posterior medial heel. The entire eschar and dorsal aspect of the ulceration was excisionally debrided using a 15 blade followed by the underlying necrotic and purulent tissue down to the level of the posterior aspect of the calcaneus. This and compress the deep and superficial subcutaneous layers. A culture was taken for sensitivities of the deep tissue. The remaining devitalized tissue was then debrided with a versa jet debrider on a setting of 6 down to good healthy bleeding tissues. Bleeders were cauterized. The wound was then flushed with copious amounts of sterile saline. A Mepitel dressing was then placed over the wound and sewed intact using 4-0 nylon simple interrupted sutures around the inferior aspect of the ulceration. Stimulan rapid cure antibiotic beads  impregnated with vancomycin with then placed into the base of the wound covering the entire ulcerative area. The rest of the Mepitel was then sewn in place again using 4-0 nylon simple interrupted sutures. The wound was then covered with 4 x 4's ABDs Kerlix and an Ace wrap. The patient was awakened and transported to the PACU with vital signs stable and in good condition.

## 2015-04-13 LAB — BASIC METABOLIC PANEL
ANION GAP: 4 — AB (ref 5–15)
BUN: 9 mg/dL (ref 6–20)
CHLORIDE: 104 mmol/L (ref 101–111)
CO2: 27 mmol/L (ref 22–32)
Calcium: 8.3 mg/dL — ABNORMAL LOW (ref 8.9–10.3)
Creatinine, Ser: 0.61 mg/dL (ref 0.44–1.00)
Glucose, Bld: 161 mg/dL — ABNORMAL HIGH (ref 65–99)
POTASSIUM: 4.3 mmol/L (ref 3.5–5.1)
SODIUM: 135 mmol/L (ref 135–145)

## 2015-04-13 LAB — CBC
HCT: 29 % — ABNORMAL LOW (ref 35.0–47.0)
Hemoglobin: 9.3 g/dL — ABNORMAL LOW (ref 12.0–16.0)
MCH: 26.2 pg (ref 26.0–34.0)
MCHC: 32.2 g/dL (ref 32.0–36.0)
MCV: 81.3 fL (ref 80.0–100.0)
PLATELETS: 264 10*3/uL (ref 150–440)
RBC: 3.56 MIL/uL — ABNORMAL LOW (ref 3.80–5.20)
RDW: 15.1 % — ABNORMAL HIGH (ref 11.5–14.5)
WBC: 6.7 10*3/uL (ref 3.6–11.0)

## 2015-04-13 LAB — GLUCOSE, CAPILLARY
GLUCOSE-CAPILLARY: 144 mg/dL — AB (ref 65–99)
GLUCOSE-CAPILLARY: 214 mg/dL — AB (ref 65–99)
GLUCOSE-CAPILLARY: 179 mg/dL — AB (ref 65–99)
GLUCOSE-CAPILLARY: 175 mg/dL — AB (ref 65–99)
Glucose-Capillary: 139 mg/dL — ABNORMAL HIGH (ref 65–99)

## 2015-04-13 MED ORDER — ROPINIROLE HCL 1 MG PO TABS
1.0000 mg | ORAL_TABLET | Freq: Every day | ORAL | Status: DC
  Administered 2015-04-13: 1 mg via ORAL
  Filled 2015-04-13: qty 1

## 2015-04-13 NOTE — Progress Notes (Signed)
1 Day Post-Op  Subjective: Patient seen. Had some pain yesterday when the anesthesia wore off but currently states it is not really bothering her that much  Objective: Vital signs in last 24 hours: Temp:  [96.3 F (35.7 C)-98.9 F (37.2 C)] 98.9 F (37.2 C) (12/03 2100) Pulse Rate:  [76-96] 83 (12/04 0001) Resp:  [12-20] 20 (12/04 0001) BP: (111-149)/(47-66) 111/53 mmHg (12/04 0001) SpO2:  [96 %-100 %] 96 % (12/04 0001) Last BM Date:  (unsure)  Intake/Output from previous day: 12/03 0701 - 12/04 0700 In: 1832 [P.O.:240; I.V.:1542; IV Piggyback:50] Out: 15 [Blood:15] Intake/Output this shift: Total I/O In: 31 [I.V.:31] Out: 0   The dressing is dry and intact. No evidence of any strikethrough at the heel. She is in her pressure relief boot  Lab Results:   Recent Labs  04/12/15 0610 04/13/15 0551  WBC 5.3 6.7  HGB 11.1* 9.3*  HCT 33.1* 29.0*  PLT 304 264   BMET  Recent Labs  04/12/15 0610 04/13/15 0551  NA 138 135  K 4.5 4.3  CL 104 104  CO2 27 27  GLUCOSE 136* 161*  BUN 15 9  CREATININE 0.69 0.61  CALCIUM 8.9 8.3*   PT/INR No results for input(s): LABPROT, INR in the last 72 hours. ABG No results for input(s): PHART, HCO3 in the last 72 hours.  Invalid input(s): PCO2, PO2  Studies/Results: Dg Foot Complete Left  04/11/2015  CLINICAL DATA:  Soft tissue infection calcaneal region. Diabetes mellitus. EXAM: LEFT FOOT - COMPLETE 3+ VIEW COMPARISON:  None. FINDINGS: Frontal, oblique, and lateral views were obtained. There is soft tissue irregularity posterior to the calcaneus consistent with cellulitis. There is no frank bony destruction. Bones are osteoporotic. There is no acute fracture or dislocation. There is moderate osteoarthritic change in all MTP, PIP, and DIP joints. There is extensive arterial vascular calcifications in the foot region. IMPRESSION: Soft tissue prominence and thickening posterior to the calcaneus, likely due to cellulitis. No erosive  change or bony destruction is appreciable. Bones are osteoporotic. Multilevel osteoarthritic change. Extensive arterial vascular calcification. No acute fracture or dislocation. If there remains concern for potential osteomyelitis, either nuclear medicine three-phase bone scan or MR could be helpful for further assessment. Electronically Signed   By: Bretta BangWilliam  Woodruff III M.D.   On: 04/11/2015 10:19    Anti-infectives: Anti-infectives    Start     Dose/Rate Route Frequency Ordered Stop   04/12/15 1100  vancomycin (VANCOCIN) powder  Status:  Discontinued       As needed 04/12/15 1100 04/12/15 1116   04/11/15 1800  vancomycin (VANCOCIN) IVPB 750 mg/150 ml premix     750 mg 150 mL/hr over 60 Minutes Intravenous Every 18 hours 04/11/15 1040     04/11/15 1100  ampicillin-sulbactam (UNASYN) 1.5 g in sodium chloride 0.9 % 50 mL IVPB     1.5 g 100 mL/hr over 30 Minutes Intravenous Every 6 hours 04/11/15 1028     04/11/15 1045  vancomycin (VANCOCIN) IVPB 750 mg/150 ml premix     750 mg 150 mL/hr over 60 Minutes Intravenous STAT 04/11/15 1038 04/11/15 1406      Assessment/Plan: s/p Procedure(s): IRRIGATION AND DEBRIDEMENT FOOT (Left) Assessment: Ulceration left heel status post debridement  Plan: Dressing was left intact. We will plan on dressing change tomorrow. Reassess the wound at that time  LOS: 2 days    Yacine Garriga W. 04/13/2015

## 2015-04-13 NOTE — Progress Notes (Signed)
[facility] [place] at [facility]                                                                                                                                                                                            Patient Demographics   [name], is a 81 y.o. female, DOB - 03-08-1928, ZOX:[id number]  Admit date - 04/11/2015   Admitting Physician Alford Highland, MD  Outpatient Primary MD for the patient is CAMPBELL, MARGARET P, FNP   LOS - 2  Subjective: Complains of restless leg and the left leg. Some pain in the left foot   Review of Systems:   CONSTITUTIONAL: No documented fever. No fatigue, weakness. No weight gain, no weight loss.  EYES: No blurry or double vision.  ENT: No tinnitus. No postnasal drip. No redness of the oropharynx.  RESPIRATORY: No cough, no wheeze, no hemoptysis. No dyspnea.  CARDIOVASCULAR: No chest pain. No orthopnea. No palpitations. No syncope.  GASTROINTESTINAL: No nausea, no vomiting or diarrhea. No abdominal pain. No melena or hematochezia.  GENITOURINARY: No dysuria or hematuria.  ENDOCRINE: No polyuria or nocturia. No heat or cold intolerance.  HEMATOLOGY: No anemia. No bruising. No bleeding.  INTEGUMENTARY: No rashes. No lesions.  MUSCULOSKELETAL: No arthritis. No swelling. No gout. Left foot pain NEUROLOGIC: No numbness, tingling, or ataxia. No seizure-type activity.  PSYCHIATRIC: No anxiety. No insomnia. No ADD.    Vitals:   Filed Vitals:   04/12/15 2100 04/13/15 0001 04/13/15 0904 04/13/15 1055  BP: 149/62 111/53 114/56 126/57  Pulse: 95 83 80 83  Temp: 98.9 F (37.2 C)  97.4 F (36.3 C)   TempSrc: Oral Oral Oral   Resp: Height:      Weight:      SpO2: 97% 96% 96%     Wt Readings from Last 3 Encounters:  04/11/15 53.326 kg (117 lb 9 oz)  02/08/15 54 kg (119 lb 0.8 oz)  01/07/15 63.095 kg (139 lb 1.6 oz)     Intake/Output Summary (Last 24 hours) at 04/13/15 1357 Last data  filed at 04/13/15 1228  Gross per 24 hour  Intake   2248 ml  Output    200 ml  Net   2048 ml    Physical Exam:   GENERAL: Pleasant-appearing in no apparent distress.  HEAD, EYES, EARS, NOSE AND THROAT: Atraumatic, normocephalic. Extraocular muscles are intact. Pupils equal and reactive to light. Sclerae anicteric. No conjunctival injection. No oro-pharyngeal erythema.  NECK: Supple. There is no jugular venous distention. No bruits, no lymphadenopathy, no thyromegaly.  HEART: Regular rate and rhythm,. No  murmurs, no rubs, no clicks.  LUNGS: Clear to auscultation bilaterally. No rales or rhonchi. No wheezes.  ABDOMEN: Soft, flat, nontender, nondistended. Has good bowel sounds. No hepatosplenomegaly appreciated.  EXTREMITIES: No evidence of any cyanosis, clubbing, or peripheral edema.  Patient has dressing on the left foot  NEUROLOGIC: The patient is alert, awake, and oriented x3 with no focal motor or sensory deficits appreciated bilaterally.  SKIN: Moist and warm with no rashes appreciated.  Psych: Not anxious, depressed LN: No inguinal LN enlargement    Antibiotics   Anti-infectives    Start     Dose/Rate Route Frequency Ordered Stop   04/12/15 1100  vancomycin (VANCOCIN) powder  Status:  Discontinued       As needed 04/12/15 1100 04/12/15 1116   04/11/15 1800  vancomycin (VANCOCIN) IVPB 750 mg/150 ml premix     750 mg 150 mL/hr over 60 Minutes Intravenous Every 18 hours 04/11/15 1040     04/11/15 1100  ampicillin-sulbactam (UNASYN) 1.5 g in sodium chloride 0.9 % 50 mL IVPB     1.5 g 100 mL/hr over 30 Minutes Intravenous Every 6 hours 04/11/15 1028     04/11/15 1045  vancomycin (VANCOCIN) IVPB 750 mg/150 ml premix     750 mg 150 mL/hr over 60 Minutes Intravenous STAT 04/11/15 1038 04/11/15 1406      Medications   Scheduled Meds: . ampicillin-sulbactam (UNASYN) IV  1.5 g Intravenous Q6H  . feeding supplement (GLUCERNA SHAKE)  237 mL Oral TID WC  . ferrous sulfate  325 mg  Oral BID WC  . insulin aspart  0-5 Units Subcutaneous QHS  . insulin aspart  0-9 Units Subcutaneous TID WC  . levothyroxine  75 mcg Oral Daily  . lisinopril  5 mg Oral Daily  . rOPINIRole  1 mg Oral QHS  . senna-docusate  2 tablet Oral BID  . sertraline  50 mg Oral Daily  . simvastatin  20 mg Oral QPM  . sodium chloride  3 mL Intravenous Q12H  . tamsulosin  0.4 mg Oral Daily  . traZODone  50 mg Oral QHS  . vancomycin  750 mg Intravenous Q18H   Continuous Infusions: . sodium chloride 50 mL/hr at 04/12/15 1300   PRN Meds:.sodium chloride, acetaminophen **OR** acetaminophen, HYDROcodone-acetaminophen, morphine injection, oxyCODONE, sodium chloride   Data Review:   Micro Results Recent Results (from the past 240 hour(s))  Wound culture     Status: None (Preliminary result)   Collection Time: 04/10/15  7:00 PM  Result Value Ref Range Status   Specimen Description FOOT  Final   Special Requests Normal  Final   Gram Stain NO WBC SEEN NO ORGANISMS SEEN   Final   Culture NORMAL SKIN FLORA  Final   Report Status PENDING  Incomplete  MRSA PCR Screening     Status: None   Collection Time: 04/12/15  1:56 AM  Result Value Ref Range Status   MRSA by PCR NEGATIVE NEGATIVE Final    Comment:        The GeneXpert MRSA Assay (FDA approved for NASAL specimens only), is one component of a comprehensive MRSA colonization surveillance program. It is not intended to diagnose MRSA infection nor to guide or monitor treatment for MRSA infections.   Wound culture     Status: None (Preliminary result)   Collection Time: 04/12/15 10:45 AM  Result Value Ref Range Status   Specimen Description WOUND  Final   Special Requests NONE  Final   Gram Stain  Final    FEW WBC SEEN RARE GRAM POSITIVE COCCI IN PAIRS RARE GRAM NEGATIVE RODS    Culture NO GROWTH < 24 HOURS  Final   Report Status PENDING  Incomplete    Radiology Reports Dg Foot Complete Left  04/11/2015  CLINICAL DATA:  Soft  tissue infection calcaneal region. Diabetes mellitus. EXAM: LEFT FOOT - COMPLETE 3+ VIEW COMPARISON:  None. FINDINGS: Frontal, oblique, and lateral views were obtained. There is soft tissue irregularity posterior to the calcaneus consistent with cellulitis. There is no frank bony destruction. Bones are osteoporotic. There is no acute fracture or dislocation. There is moderate osteoarthritic change in all MTP, PIP, and DIP joints. There is extensive arterial vascular calcifications in the foot region. IMPRESSION: Soft tissue prominence and thickening posterior to the calcaneus, likely due to cellulitis. No erosive change or bony destruction is appreciable. Bones are osteoporotic. Multilevel osteoarthritic change. Extensive arterial vascular calcification. No acute fracture or dislocation. If there remains concern for potential osteomyelitis, either nuclear medicine three-phase bone scan or MR could be helpful for further assessment. Electronically Signed   By: Bretta BangWilliam  Woodruff III M.D.   On: 04/11/2015 10:19     CBC  Recent Labs Lab 04/11/15 0927 04/12/15 0610 04/13/15 0551  WBC 7.9 5.3 6.7  HGB 11.2* 11.1* 9.3*  HCT 34.6* 33.1* 29.0*  PLT 360 304 264  MCV 82.1 81.7 81.3  MCH 26.6 27.3 26.2  MCHC 32.4 33.4 32.2  RDW 15.2* 15.5* 15.1*  LYMPHSABS 1.6  --   --   MONOABS 0.5  --   --   EOSABS 0.1  --   --   BASOSABS 0.1  --   --     Chemistries   Recent Labs Lab 04/11/15 0927 04/12/15 0610 04/13/15 0551  NA 137 138 135  K 4.7 4.5 4.3  CL 101 104 104  CO2 26 27 27   GLUCOSE 198* 136* 161*  BUN 20 15 9   CREATININE 0.73 0.69 0.61  CALCIUM 9.3 8.9 8.3*   ------------------------------------------------------------------------------------------------------------------ estimated creatinine clearance is 42.5 mL/min (by C-G formula based on Cr of 0.61). ------------------------------------------------------------------------------------------------------------------  Recent Labs   04/11/15 0927  HGBA1C 6.6*   ------------------------------------------------------------------------------------------------------------------ No results for input(s): CHOL, HDL, LDLCALC, TRIG, CHOLHDL, LDLDIRECT in the last 72 hours. ------------------------------------------------------------------------------------------------------------------ No results for input(s): TSH, T4TOTAL, T3FREE, THYROIDAB in the last 72 hours.  Invalid input(s): FREET3 ------------------------------------------------------------------------------------------------------------------ No results for input(s): VITAMINB12, FOLATE, FERRITIN, TIBC, IRON, RETICCTPCT in the last 72 hours.  Coagulation profile No results for input(s): INR, PROTIME in the last 168 hours.  No results for input(s): DDIMER in the last 72 hours.  Cardiac Enzymes No results for input(s): CKMB, TROPONINI, MYOGLOBIN in the last 168 hours.  Invalid input(s): CK ------------------------------------------------------------------------------------------------------------------ Invalid input(s): POCBNP    Assessment & Plan   1. Gangrene left heel. Status post incision and drainage await cultures continue Unasyn and vancomycin. Podiatry will change the dressing tomorrow  2. Type 2 diabetes- sliding-scale, hemoglobin A1c elevated at 6.6 needs diet controlBlood sugars likely elevated due to him dextrose in antibiotics  3. Essential hypertension continue lisinopril 4. Hyperlipidemia unspecified continue Zocor 5. Hypothyroidism unspecified continue levothyroxin 6. Alzheimer's dementia without behavioral disturbance     Code Status Orders        Start     Ordered   04/12/15 1216  Full code   Continuous     04/12/15 1215           Consults  none  DVT Prophylaxis heparin  Lab Results  Component Value Date   PLT 264 04/13/2015     Time Spent in minutes     Auburn Bilberry M.D on 04/13/2015 at 1:57 PM  Between  7am to 6pm - Pager - [phone]  After 6pm go to [email or web address] - password [email or web address] [facility]  [facility]   Office  [phone]

## 2015-04-14 ENCOUNTER — Encounter: Payer: Self-pay | Admitting: Podiatry

## 2015-04-14 LAB — GLUCOSE, CAPILLARY
GLUCOSE-CAPILLARY: 133 mg/dL — AB (ref 65–99)
Glucose-Capillary: 168 mg/dL — ABNORMAL HIGH (ref 65–99)
Glucose-Capillary: 193 mg/dL — ABNORMAL HIGH (ref 65–99)

## 2015-04-14 LAB — CBC
HEMATOCRIT: 31 % — AB (ref 35.0–47.0)
HEMOGLOBIN: 10.2 g/dL — AB (ref 12.0–16.0)
MCH: 26.8 pg (ref 26.0–34.0)
MCHC: 32.8 g/dL (ref 32.0–36.0)
MCV: 81.8 fL (ref 80.0–100.0)
Platelets: 281 10*3/uL (ref 150–440)
RBC: 3.79 MIL/uL — ABNORMAL LOW (ref 3.80–5.20)
RDW: 15.5 % — ABNORMAL HIGH (ref 11.5–14.5)
WBC: 5.9 10*3/uL (ref 3.6–11.0)

## 2015-04-14 LAB — VANCOMYCIN, TROUGH: VANCOMYCIN TR: 11 ug/mL (ref 10–20)

## 2015-04-14 MED ORDER — AMOXICILLIN-POT CLAVULANATE 875-125 MG PO TABS: 1.0000 | ORAL_TABLET | Freq: Two times a day (BID) | ORAL | Status: AC

## 2015-04-14 MED ORDER — ROPINIROLE HCL 1 MG PO TABS: 1.0000 mg | ORAL_TABLET | Freq: Every day | ORAL | Status: DC

## 2015-04-14 MED ORDER — VANCOMYCIN HCL IN DEXTROSE 750-5 MG/150ML-% IV SOLN
750.0000 mg | Freq: Two times a day (BID) | INTRAVENOUS | Status: DC
  Administered 2015-04-14: 750 mg via INTRAVENOUS
  Filled 2015-04-14 (×3): qty 150

## 2015-04-14 MED ORDER — GLUCERNA SHAKE PO LIQD: 237.0000 mL | Freq: Three times a day (TID) | ORAL | Status: DC

## 2015-04-14 MED ORDER — HYDROCODONE-ACETAMINOPHEN 5-325 MG PO TABS: 1.0000 | ORAL_TABLET | ORAL | Status: DC | PRN

## 2015-04-14 MED ORDER — FLEET ENEMA 7-19 GM/118ML RE ENEM
1.0000 | ENEMA | Freq: Once | RECTAL | Status: AC
  Administered 2015-04-14: 1 via RECTAL

## 2015-04-14 NOTE — Evaluation (Signed)
Physical Therapy Evaluation Patient Details Name: [name] MRN: [id number] DOB: 1927/08/21 Today's Date: 04/14/2015   History of Present Illness  81 year old female who was admitted for wound infection on L heel x few months. Pt with history of Alzheimer's disease, DM, and HTN. Pt now s/p I&D from Dr. Alberteen Spindleline on L heel. Pt currently from [facility].  Clinical Impression  Pt is a pleasant 81 year old female who was admitted for L heel infection and is s/p I& D on 04/12/15. Pt performs bed mobility with min assist, transfers with mod assist, and ambulation with mod assist and rw. Pt requires heavy cues for sequencing and safety during mobility as she tries to use furniture instead of rw. High fall risk. Pt demonstrates deficits with strength/pain/mobility. Would benefit from skilled PT to address above deficits and promote optimal return to PLOF. During pt interaction, pt very tearful, however reports little pain. When asked what was wrong, she said she is being lied to, however unable to further elaborate. RN notified. Pt also upset that she is unable to use her rollater from home. Rollater not recommended at this time for increased fall risk.      Follow Up Recommendations SNF    Equipment Recommendations       Recommendations for Other Services       Precautions / Restrictions Precautions Precautions: Fall Restrictions Weight Bearing Restrictions: No      Mobility  Bed Mobility Overal bed mobility: Needs Assistance             General bed mobility comments: supine->sit with min assist. Once seated at EOB, pt able to sit with cga.  Transfers Overall transfer level: Needs assistance Equipment used: Rolling walker (2 wheeled) Transfers: Sit to/from Stand Sit to Stand: Mod assist         General transfer comment: sit<>Stand with rw and mod assist. Heavy cues for pushing through B UE on rw. Pt with forward flexed posture and self restricts weight bearing on L  heel.  Ambulation/Gait Ambulation/Gait assistance: Mod assist Ambulation Distance (Feet): 7 Feet Assistive device: Rolling walker (2 wheeled) Gait Pattern/deviations: Step-to pattern;Antalgic     General Gait Details: ambulated using rw and requires heavy assistance for safe technique. Step to gait pattern performed, however pt constantly reaches to hold onto furniture and needs cues to keep hold on rw during ambulation. Manual facilitation required for forward momentum.  Stairs            Wheelchair Mobility    Modified Rankin (Stroke Patients Only)       Balance Overall balance assessment: Needs assistance Sitting-balance support: Bilateral upper extremity supported;Feet supported Sitting balance-Leahy Scale: Fair     Standing balance support: Bilateral upper extremity supported Standing balance-Leahy Scale: Poor                               Pertinent Vitals/Pain Pain Assessment: Faces Faces Pain Scale: Hurts whole lot Pain Location: L heel Pain Descriptors / Indicators: Constant Pain Intervention(s): Limited activity within patient's tolerance;Premedicated before session;Repositioned    Home Living Family/patient expects to be discharged to:: Assisted living               Home Equipment: Walker - 4 wheels;Shower seat Additional Comments: previously used Product managerrollater. pt upset/tearful that she doesn't have her rollater here at this hospital    Prior Function Level of Independence: Needs assistance   Gait / Transfers  Assistance Needed: Uses 4ww  ADL's / Homemaking Assistance Needed: Independent with dressing but requires assist for meals, bathing, laundry, cleaning  Comments: Patient had been independent with basic ADL such as dressing and bathing.     Hand Dominance        Extremity/Trunk Assessment   Upper Extremity Assessment: Generalized weakness (B UE grossly 3+/5)           Lower Extremity Assessment: Generalized weakness (B  LE grossly 3/5)         Communication   Communication: No difficulties  Cognition Arousal/Alertness: Awake/alert (very tearful) Behavior During Therapy: Anxious Overall Cognitive Status: History of cognitive impairments - at baseline                      General Comments      Exercises Other Exercises Other Exercises: Pt ambulated to [facility] where she required mod assist for set up and to reach back with B hands prior to sitting down. Assist also required for pulling down undergarments. min assist required for hygiene.      Assessment/Plan    PT Assessment Patient needs continued PT services  PT Diagnosis Difficulty walking;Abnormality of gait;Acute pain;Generalized weakness   PT Problem List Decreased strength;Decreased balance;Decreased mobility;Pain  PT Treatment Interventions Gait training;Therapeutic exercise   PT Goals (Current goals can be found in the Care Plan section) Acute Rehab PT Goals Patient Stated Goal: to go back to ALF today PT Goal Formulation: With patient Time For Goal Achievement: 04/28/15 Potential to Achieve Goals: Fair    Frequency Min 2X/week   Barriers to discharge        Co-evaluation               End of Session Equipment Utilized During Treatment: Gait belt Activity Tolerance: Patient limited by fatigue;Patient limited by pain Patient left: in chair;with chair alarm set Nurse Communication: Mobility status         Time: 4098-1191 PT Time Calculation (min) (ACUTE ONLY): 23 min   Charges:   PT Evaluation $Initial PT Evaluation Tier I: 1 Procedure PT Treatments $Therapeutic Activity: 8-22 mins   PT G Codes:        Ivelis Norgard May 13, 2015, 1:14 PM Elizabeth Palau, PT, DPT [phone]

## 2015-04-14 NOTE — Progress Notes (Signed)
Pt was noted to be tearful after MD changed dressing on Left heel. Pain medication given. When reassessed, pt stated she was not in pain. Upon rounding, pt was found to be tearful again, stating she is not in any pain. Emotional support given to pt.

## 2015-04-14 NOTE — Discharge Summary (Signed)
[name], 81 y.o., DOB 06/28/1927, MRN [id number]. Admission date: 04/11/2015 Discharge Date 04/14/2015 Primary MD CAMPBELL, Kristen Loader, FNP Admitting Physician Alford Highland, MD  Admission Diagnosis  Wound infection (HCC) [T14.8, L08.9]  Discharge Diagnosis   Active Problems:   Gangrene (HCC)   Pressure ulcer  Alzheimer's dementia Diabetes type 2 Hypothyroidism Visual impairment Hearing impairment Hypertension Unsteady gait        Hospital Course [name] is a 81 y.o. female with a known history of left heel infection going on for a few months. She is followed at the wound care center. She was seen at the wound care center  and sent in for further evaluation. She's been on doxycycline for 10 days. Left heel is gangrenous and draining pus. Due to this she was admitted and started on broad-spectrum antibiotics. Podiatry consult was obtained. She underwent incision and drainage of the left heel ulceration and abscess. Dressing was placed and was followed throughout the hospitalization with by podiatry. Patient has cultures are growing some gram-negative bacteria as well as gram-negative. She'll be continued on oral anabiotic's. She's been cleared by podiatry to follow up outpatient. She is very weak and was seen by physical therapy and they recommended for rehabilitation.           Consults  podiatry  Significant Tests:  See full reports for all details    Dg Foot Complete Left  04/11/2015  CLINICAL DATA:  Soft tissue infection calcaneal region. Diabetes mellitus. EXAM: LEFT FOOT - COMPLETE 3+ VIEW COMPARISON:  None. FINDINGS: Frontal, oblique, and lateral views were obtained. There is soft tissue irregularity posterior to the calcaneus consistent with cellulitis. There is no frank bony destruction. Bones are osteoporotic. There is no acute fracture or dislocation. There is moderate osteoarthritic change in all MTP, PIP, and DIP joints. There is extensive  arterial vascular calcifications in the foot region. IMPRESSION: Soft tissue prominence and thickening posterior to the calcaneus, likely due to cellulitis. No erosive change or bony destruction is appreciable. Bones are osteoporotic. Multilevel osteoarthritic change. Extensive arterial vascular calcification. No acute fracture or dislocation. If there remains concern for potential osteomyelitis, either nuclear medicine three-phase bone scan or MR could be helpful for further assessment. Electronically Signed   By: Bretta Bang III M.D.   On: 04/11/2015 10:19       Today   Subjective:   [name]  feels okay denies any chest pain or shortness of breath  Objective:   Blood pressure 142/58, pulse 80, temperature 97.8 F (36.6 C), temperature source Oral, resp. rate 17, height 5\' 3"  (1.6 m), weight 53.326 kg (117 lb 9 oz), SpO2 96 %.  .  Intake/Output Summary (Last 24 hours) at 04/14/15 1459 Last data filed at 04/14/15 1300  Gross per 24 hour  Intake 2415.67 ml  Output   1125 ml  Net 1290.67 ml    Exam VITAL SIGNS: Blood pressure 142/58, pulse 80, temperature 97.8 F (36.6 C), temperature source Oral, resp. rate 17, height 5\' 3"  (1.6 m), weight 53.326 kg (117 lb 9 oz), SpO2 96 %.  GENERAL:  81 y.o.-year-old patient lying in the bed with no acute distress.  EYES: Pupils equal, round, reactive to light and accommodation. No scleral icterus. Extraocular muscles intact.  HEENT: Head atraumatic, normocephalic. Oropharynx and nasopharynx clear.  NECK:  Supple, no jugular venous distention. No thyroid enlargement, no tenderness.  LUNGS: Normal breath sounds bilaterally, no wheezing, rales,rhonchi or crepitation. No use of accessory muscles of respiration.  CARDIOVASCULAR: S1, S2 normal. No murmurs, rubs, or gallops.  ABDOMEN: Soft, nontender, nondistended. Bowel sounds present. No organomegaly or mass.  EXTREMITIES: No pedal edema, cyanosis, or clubbing. Dressing in place in the  left lower extremity  NEUROLOGIC: Cranial nerves II through XII are intact. Muscle strength 5/5 in all extremities. Sensation intact. Gait not checked.  PSYCHIATRIC: The patient is alert and oriented x 3.  SKIN: No obvious rash, lesion, or ulcer.   Data Review     CBC w Diff: Lab Results  Component Value Date   WBC 5.9 04/14/2015   WBC 7.8 10/01/2011   HGB 10.2* 04/14/2015   HGB 12.9 10/01/2011   HCT 31.0* 04/14/2015   HCT 39.5 10/01/2011   PLT 281 04/14/2015   PLT 241 10/01/2011   LYMPHOPCT 21 04/11/2015   MONOPCT 6 04/11/2015   EOSPCT 1 04/11/2015   BASOPCT 1 04/11/2015   CMP: Lab Results  Component Value Date   NA 135 04/13/2015   NA 140 10/01/2011   K 4.3 04/13/2015   K 4.0 10/01/2011   CL 104 04/13/2015   CL 102 10/01/2011   CO2 27 04/13/2015   CO2 30 10/01/2011   BUN 9 04/13/2015   BUN 20* 10/01/2011   CREATININE 0.61 04/13/2015   CREATININE 0.78 10/01/2011   PROT 7.7 10/01/2011   ALBUMIN 4.0 01/07/2015   ALBUMIN 3.4 10/01/2011   BILITOT 0.2 10/01/2011   ALKPHOS 69 10/01/2011   AST 22 10/01/2011   ALT 26 10/01/2011  .  Micro Results Recent Results (from the past 240 hour(s))  Wound culture     Status: None (Preliminary result)   Collection Time: 04/10/15  7:00 PM  Result Value Ref Range Status   Specimen Description FOOT  Final   Special Requests Normal  Final   Gram Stain NO WBC SEEN NO ORGANISMS SEEN   Final   Culture NORMAL SKIN FLORA  Final   Report Status PENDING  Incomplete  MRSA PCR Screening     Status: None   Collection Time: 04/12/15  1:56 AM  Result Value Ref Range Status   MRSA by PCR NEGATIVE NEGATIVE Final    Comment:        The GeneXpert MRSA Assay (FDA approved for NASAL specimens only), is one component of a comprehensive MRSA colonization surveillance program. It is not intended to diagnose MRSA infection nor to guide or monitor treatment for MRSA infections.   Anaerobic culture     Status: None (Preliminary result)    Collection Time: 04/12/15 10:45 AM  Result Value Ref Range Status   Specimen Description WOUND  Final   Special Requests NONE  Final   Gram Stain PENDING  Incomplete   Culture HOLDING FOR POSSIBLE PATHOGEN  Final   Report Status PENDING  Incomplete  Wound culture     Status: None (Preliminary result)   Collection Time: 04/12/15 10:45 AM  Result Value Ref Range Status   Specimen Description WOUND  Final   Special Requests NONE  Final   Gram Stain   Final    FEW WBC SEEN RARE GRAM POSITIVE COCCI IN PAIRS RARE GRAM NEGATIVE RODS    Culture HOLDING FOR POSSIBLE PATHOGEN  Final   Report Status PENDING  Incomplete        Code Status Orders        Start     Ordered   04/12/15 1216  Full code   Continuous     04/12/15 1215  Follow-up Information    Follow up with CLINE,TODD W., MD In 7 days.   Specialty:  Podiatry   Contact information:   [address] [facility] [place] [address] [phone]       Follow up with md at snf In 7 days.      Discharge Medications     Medication List    STOP taking these medications        docusate sodium 100 MG capsule  Commonly known as:  COLACE     doxycycline 100 MG tablet  Commonly known as:  VIBRA-TABS     enoxaparin 30 MG/0.3ML injection  Commonly known as:  LOVENOX     oxyCODONE 5 MG immediate release tablet  Commonly known as:  Oxy IR/ROXICODONE      TAKE these medications        acetaminophen 325 MG tablet  Commonly known as:  TYLENOL  Take 325 mg by mouth every 6 (six) hours as needed for mild pain or moderate pain.     amoxicillin-clavulanate 875-125 MG tablet  Commonly known as:  AUGMENTIN  Take 1 tablet by mouth 2 (two) times daily.     aspirin EC 325 MG tablet  Take 325 mg by mouth daily.     feeding supplement (GLUCERNA SHAKE) Liqd  Take 237 mLs by mouth 3 (three) times daily with meals.     ferrous sulfate 325 (65 FE) MG tablet  Take 1 tablet (325 mg  total) by mouth 2 (two) times daily with a meal.     HYDROcodone-acetaminophen 5-325 MG tablet  Commonly known as:  NORCO/VICODIN  Take 1 tablet by mouth every 4 (four) hours as needed for moderate pain.     insulin aspart 100 UNIT/ML injection  Commonly known as:  novoLOG  Inject into the skin 3 (three) times daily as needed for high blood sugar (per sliding scale).     insulin detemir 100 UNIT/ML injection  Commonly known as:  LEVEMIR  Inject 0.25 mLs (25 Units total) into the skin at bedtime.     levothyroxine 75 MCG tablet  Commonly known as:  SYNTHROID, LEVOTHROID  Take 75 mcg by mouth daily.     lisinopril 5 MG tablet  Commonly known as:  PRINIVIL,ZESTRIL  Take 5 mg by mouth daily.     metFORMIN 500 MG tablet  Commonly known as:  GLUCOPHAGE  Take 500 mg by mouth daily.     polyethylene glycol packet  Commonly known as:  MIRALAX / GLYCOLAX  Take 17 g by mouth daily as needed for mild constipation.     rOPINIRole 1 MG tablet  Commonly known as:  REQUIP  Take 1 tablet (1 mg total) by mouth at bedtime.     senna-docusate 8.6-50 MG tablet  Commonly known as:  Senokot-S  Take 2 tablets by mouth 2 (two) times daily.     sertraline 50 MG tablet  Commonly known as:  ZOLOFT  Take 50 mg by mouth daily.     simvastatin 20 MG tablet  Commonly known as:  ZOCOR  Take 20 mg by mouth every evening.     tamsulosin 0.4 MG Caps capsule  Commonly known as:  FLOMAX  Take 1 capsule (0.4 mg total) by mouth daily.     traZODone 50 MG tablet  Commonly known as:  DESYREL  Take 50 mg by mouth at bedtime.           Total Time in  preparing paper work, data evaluation and todays exam - 35 minutes  Auburn Bilberry M.D on 04/14/2015 at 2:59 PM  [facility]   Office  [phone]

## 2015-04-14 NOTE — Clinical Social Work Placement (Signed)
   CLINICAL SOCIAL WORK PLACEMENT  NOTE  Date:  04/14/2015  Patient Details  Name: [name] MRN: [id number] Date of Birth: 1927-11-04  Clinical Social Work is seeking post-discharge placement for this patient at the Skilled  Nursing Facility level of care (*CSW will initial, date and re-position this form in  chart as items are completed):  Yes   Patient/family provided with [facility] Clinical Social Work Department's list of facilities offering this level of care within the geographic area requested by the patient (or if unable, by the patient's family).      Patient/family informed of their freedom to choose among providers that offer the needed level of care, that participate in Medicare, Medicaid or managed care program needed by the patient, have an available bed and are willing to accept the patient.      Patient/family informed of [facility] ownership interest in [facility] and [facility], as well as of the fact that they are under no obligation to receive care at these facilities.  PASRR submitted to EDS on       PASRR number received on       Existing PASRR number confirmed on 04/14/15     FL2 transmitted to all facilities in geographic area requested by pt/family on 04/14/15     FL2 transmitted to all facilities within larger geographic area on       Patient informed that his/her managed care company has contracts with or will negotiate with certain facilities, including the following:        Yes   Patient/family informed of bed offers received.  [facility]     Physician recommends and patient chooses bed at      [facility] 04[facility]14/[facility].  Patient to be transferred to facility by EMS     Patient family notified on 04/14/15 of transfer.  Name of family member notified:  Daughter [name]      PHYSICIAN       Additional Comment:     _______________________________________________ Ned Cardara N Pericles Carmicheal, LCSW 04/14/2015, 5:00 PM

## 2015-04-14 NOTE — Progress Notes (Signed)
2 Days Post-Op  Subjective: Patient seen. Relates significant pain in the left heel at a level of about 10. States she cannot sleep with the big bulky boot on her foot.  Objective: Vital signs in last 24 hours: Temp:  [97.4 F (36.3 C)-99.4 F (37.4 C)] 98.5 F (36.9 C) (12/05 0600) Pulse Rate:  [76-83] 79 (12/05 0600) Resp:  [16-20] 16 (12/05 0600) BP: (114-156)/(52-63) 156/60 mmHg (12/05 0600) SpO2:  [96 %-98 %] 96 % (12/05 0600) Last BM Date:  (unsure)  Intake/Output from previous day: 12/04 0701 - 12/05 0700 In: 1313.7 [P.O.:240; I.V.:1023.7; IV Piggyback:50] Out: 1475 [Urine:1475] Intake/Output this shift:    The bandages clean dry and intact. Upon removal there is minimal bleeding at the posterior heel. No significant cellulitis. Overall appears stable  Lab Results:   Recent Labs  04/13/15 0551 04/14/15 0452  WBC 6.7 5.9  HGB 9.3* 10.2*  HCT 29.0* 31.0*  PLT 264 281   BMET  Recent Labs  04/12/15 0610 04/13/15 0551  NA 138 135  K 4.5 4.3  CL 104 104  CO2 27 27  GLUCOSE 136* 161*  BUN 15 9  CREATININE 0.69 0.61  CALCIUM 8.9 8.3*   PT/INR No results for input(s): LABPROT, INR in the last 72 hours. ABG No results for input(s): PHART, HCO3 in the last 72 hours.  Invalid input(s): PCO2, PO2  Studies/Results: No results found.  Anti-infectives: Anti-infectives    Start     Dose/Rate Route Frequency Ordered Stop   04/14/15 1400  vancomycin (VANCOCIN) IVPB 750 mg/150 ml premix     750 mg 150 mL/hr over 60 Minutes Intravenous Every 12 hours 04/14/15 0243     04/12/15 1100  vancomycin (VANCOCIN) powder  Status:  Discontinued       As needed 04/12/15 1100 04/12/15 1116   04/11/15 1800  vancomycin (VANCOCIN) IVPB 750 mg/150 ml premix  Status:  Discontinued     750 mg 150 mL/hr over 60 Minutes Intravenous Every 18 hours 04/11/15 1040 04/14/15 0243   04/11/15 1100  ampicillin-sulbactam (UNASYN) 1.5 g in sodium chloride 0.9 % 50 mL IVPB     1.5 g 100  mL/hr over 30 Minutes Intravenous Every 6 hours 04/11/15 1028     04/11/15 1045  vancomycin (VANCOCIN) IVPB 750 mg/150 ml premix     750 mg 150 mL/hr over 60 Minutes Intravenous STAT 04/11/15 1038 04/11/15 1406      Assessment/Plan: s/p Procedure(s): IRRIGATION AND DEBRIDEMENT FOOT (Left) Assessment: Full thickness ulceration left heel status post debridement   Plan: Dressing reapplied to the left heel. At this point the bandage may stay intact for about a week before will need changing. Would recommend continuing the pressure relief boot. As far as her foot is concerned she should be stable for discharge when appropriate  LOS: 3 days    [name] W. 04/14/2015

## 2015-04-14 NOTE — [place] FL2 (Signed)
[place] MEDICAID FL2 LEVEL OF CARE SCREENING TOOL     IDENTIFICATION  Patient Name: [name] Birthdate: 07/24/1927 Sex: female Admission Date (Current Location): 04/11/2015  [place] [facility] [place]    Facility and Address:  [facility], [address], [place], [place] [address]      Provider Number: [id number]  Attending Physician Name and Address:  Auburn BilberryShreyang Patel, MD  Relative Name and Phone Number:       Current Level of Care: Hospital Recommended Level of Care: Assisted Living Facility Prior Approval Number:    Date Approved/Denied:   PASRR Number:    Discharge Plan: Domiciliary (Rest home)    Current Diagnoses: Patient Active Problem List   Diagnosis Date Noted  . Pressure ulcer 04/12/2015  . Gangrene (HCC) 04/11/2015  . Urinary retention 02/04/2015  . Atelectasis 01/10/2015  . Acute encephalopathy 01/10/2015  . Dementia 01/10/2015  . Closed right hip fracture (HCC) 01/07/2015    Orientation ACTIVITIES/SOCIAL BLADDER RESPIRATION    Self, Situation, Place  Passive, Re-Socialization, Family supportive Continent Normal  BEHAVIORAL SYMPTOMS/MOOD NEUROLOGICAL BOWEL NUTRITION STATUS  Other (Comment) (tearful)   Continent    PHYSICIAN VISITS COMMUNICATION OF NEEDS Height & Weight Skin  30 days Verbally   117 lbs. PU Stage and Appropriate Care    see below      AMBULATORY STATUS RESPIRATION    Assist extensive Normal      Personal Care Assistance Level of Assistance  Bathing, Feeding, Dressing Bathing Assistance: Limited assistance Feeding assistance: Limited assistance Dressing Assistance: Limited assistance      Functional Limitations Info  Sight, Hearing, Speech Sight Info: Impaired Hearing Info: Impaired Speech Info: Adequate       SPECIAL CARE FACTORS FREQUENCY   Wound care   Dressing reapplied to the left heel. At this point the bandage may stay intact for about a week before will  need changing. Would recommend continuing the pressure relief boot. As far as her foot is concerned she should be stable for discharge when appropriate.                 Additional Factors Info  Code Status, Allergies, Psychotropic Code Status Info: Full Allergies Info: NKA           Current Medications (04/14/2015):  This is the current hospital active medication list Current Facility-Administered Medications  Medication Dose Route Frequency Provider Last Rate Last Dose  . 0.9 %  sodium chloride infusion   Intravenous Continuous Alford Highlandichard Wieting, MD 50 mL/hr at 04/14/15 1230    . 0.9 %  sodium chloride infusion  250 mL Intravenous PRN Linus Galasodd Cline, MD      . acetaminophen (TYLENOL) tablet 650 mg  650 mg Oral Q6H PRN Alford Highlandichard Wieting, MD       Or  . acetaminophen (TYLENOL) suppository 650 mg  650 mg Rectal Q6H PRN Alford Highlandichard Wieting, MD      . ampicillin-sulbactam (UNASYN) 1.5 g in sodium chloride 0.9 % 50 mL IVPB  1.5 g Intravenous Q6H Alford Highlandichard Wieting, MD   1.5 g at 04/14/15 1038  . feeding supplement (GLUCERNA SHAKE) (GLUCERNA SHAKE) liquid 237 mL  237 mL Oral TID WC Auburn BilberryShreyang Patel, MD   237 mL at 04/14/15 1231  . ferrous sulfate tablet 325 mg  325 mg Oral BID WC Alford Highlandichard Wieting, MD   325 mg at 04/14/15 [phone]  . HYDROcodone-acetaminophen (NORCO/VICODIN) 5-325 MG per tablet 1 tablet  1 tablet Oral Q4H PRN Linus Galasodd Cline,  MD   1 tablet at 04/12/15 2336  . insulin aspart (novoLOG) injection 0-5 Units  0-5 Units Subcutaneous QHS Alford Highland, MD   0 Units at 04/11/15 2232  . insulin aspart (novoLOG) injection 0-9 Units  0-9 Units Subcutaneous TID WC Alford Highland, MD   1 Units at 04/14/15 1229  . levothyroxine (SYNTHROID, LEVOTHROID) tablet 75 mcg  75 mcg Oral Daily Alford Highland, MD   75 mcg at 04/14/15 0527  . lisinopril (PRINIVIL,ZESTRIL) tablet 5 mg  5 mg Oral Daily Alford Highland, MD   5 mg at 04/14/15 0841  . morphine 2 MG/ML injection 2 mg  2 mg Intravenous Q2H PRN Linus Galas, MD   2  mg at 04/14/15 0839  . oxyCODONE (Oxy IR/ROXICODONE) immediate release tablet 5 mg  5 mg Oral Q4H PRN Alford Highland, MD   5 mg at 04/13/15 2134  . rOPINIRole (REQUIP) tablet 1 mg  1 mg Oral QHS Auburn Bilberry, MD   1 mg at 04/13/15 2134  . senna-docusate (Senokot-S) tablet 2 tablet  2 tablet Oral BID Alford Highland, MD   2 tablet at 04/14/15 [phone]  . sertraline (ZOLOFT) tablet 50 mg  50 mg Oral Daily Alford Highland, MD   50 mg at 04/14/15 0840  . simvastatin (ZOCOR) tablet 20 mg  20 mg Oral QPM Alford Highland, MD   20 mg at 04/13/15 1803  . sodium chloride 0.9 % injection 3 mL  3 mL Intravenous Q12H Linus Galas, MD   3 mL at 04/12/15 1300  . sodium chloride 0.9 % injection 3 mL  3 mL Intravenous PRN Linus Galas, MD      . tamsulosin [facility]) capsule 0.4 mg  0.4 mg Oral Daily Alford Highland, MD   0.4 mg at 04/14/15 0840  . traZODone (DESYREL) tablet 50 mg  50 mg Oral QHS Alford Highland, MD   50 mg at 04/13/15 2134  . vancomycin (VANCOCIN) IVPB 750 mg/150 ml premix  750 mg Intravenous Q12H Alford Highland, MD         Discharge Medications: Please see discharge summary for a list of discharge medications.  Relevant Imaging Results:  Relevant Lab Results:  Recent Labs    Additional Information    Ned Card, LCSW

## 2015-04-14 NOTE — Progress Notes (Signed)
CSW was notified that Pt has been medically cleared for dc to SNF [facility]. CSW sent Pt's chart electronically. RN to call report and EMS for transport.    No further CSW needs at this time.   Wilford Gristara Shterna Laramee, LCSW [phone]

## 2015-04-14 NOTE — Progress Notes (Signed)
Report called to Dennie BiblePat at [facility].

## 2015-04-14 NOTE — Progress Notes (Signed)
ANTIBIOTIC CONSULT NOTE - INITIAL  Pharmacy Consult for Vancomycin and Unasyn  Indication: wound infection and cellulitis   No Known Allergies  Patient Measurements: Height:  (160 cm) Weight: 117 lb 9 oz (53.326 kg) IBW/kg (Calculated) : 52.4 Adjusted Body Weight:   Vital Signs: Temp: 98.4 F (36.9 C) (12/05 0108) Temp Source: Oral (12/05 0108) BP: 126/59 mmHg (12/05 0108) Pulse Rate: 77 (12/05 0108) Intake/Output from previous day: 12/04 0701 - 12/05 0700 In: 1313.7 [P.O.:240; I.V.:1023.7; IV Piggyback:50] Out: 1475 [Urine:1475] Intake/Output from this shift: Total I/O In: 446.7 [I.V.:396.7; IV Piggyback:50] Out: 500 [Urine:500]  Labs:  Recent Labs  04/11/15 0927 04/12/15 0610 04/13/15 0551  WBC 7.9 5.3 6.7  HGB 11.2* 11.1* 9.3*  PLT 360 304 264  CREATININE 0.73 0.69 0.61   Estimated Creatinine Clearance: 42.5 mL/min (by C-G formula based on Cr of 0.61).  Recent Labs  04/14/15 0109  VANCOTROUGH 11     Microbiology: Recent Results (from the past 720 hour(s))  Wound culture     Status: None (Preliminary result)   Collection Time: 04/10/15  7:00 PM  Result Value Ref Range Status   Specimen Description FOOT  Final   Special Requests Normal  Final   Gram Stain NO WBC SEEN NO ORGANISMS SEEN   Final   Culture NORMAL SKIN FLORA  Final   Report Status PENDING  Incomplete  MRSA PCR Screening     Status: None   Collection Time: 04/12/15  1:56 AM  Result Value Ref Range Status   MRSA by PCR NEGATIVE NEGATIVE Final    Comment:        The GeneXpert MRSA Assay (FDA approved for NASAL specimens only), is one component of a comprehensive MRSA colonization surveillance program. It is not intended to diagnose MRSA infection nor to guide or monitor treatment for MRSA infections.   Wound culture     Status: None (Preliminary result)   Collection Time: 04/12/15 10:45 AM  Result Value Ref Range Status   Specimen Description WOUND  Final   Special Requests  NONE  Final   Gram Stain   Final    FEW WBC SEEN RARE GRAM POSITIVE COCCI IN PAIRS RARE GRAM NEGATIVE RODS    Culture NO GROWTH < 24 HOURS  Final   Report Status PENDING  Incomplete    Medical History: Past Medical History  Diagnosis Date  . Alzheimer's dementia   . Diabetes type 2, controlled (HCC)   . Hypothyroidism   . Vision impairment   . Hearing impairment   . Hypertension   . Unsteady gait     Medications:  Prescriptions prior to admission  Medication Sig Dispense Refill Last Dose  . acetaminophen (TYLENOL) 325 MG tablet Take 325 mg by mouth every 6 (six) hours as needed for mild pain or moderate pain.   PRN  . aspirin EC 325 MG tablet Take 325 mg by mouth daily.   04/11/2015 at 0800  . doxycycline (VIBRA-TABS) 100 MG tablet Take 100 mg by mouth 2 (two) times daily.   04/11/2015 at 0800  . ferrous sulfate 325 (65 FE) MG tablet Take 1 tablet (325 mg total) by mouth 2 (two) times daily with a meal. (Patient taking differently: Take 325 mg by mouth 3 (three) times daily before meals. ) 60 tablet 0 04/11/2015 at 0700  . insulin aspart (NOVOLOG) 100 UNIT/ML injection Inject into the skin 3 (three) times daily as needed for high blood sugar (per sliding scale).  04/10/2015 at 2000  . insulin detemir (LEVEMIR) 100 UNIT/ML injection Inject 0.25 mLs (25 Units total) into the skin at bedtime. (Patient taking differently: Inject 20 Units into the skin at bedtime. ) 10 mL 0 04/10/2015 at 2000  . levothyroxine (SYNTHROID, LEVOTHROID) 75 MCG tablet Take 75 mcg by mouth daily.   04/11/2015 at 0800  . lisinopril (PRINIVIL,ZESTRIL) 5 MG tablet Take 5 mg by mouth daily.   04/11/2015 at 0800  . metFORMIN (GLUCOPHAGE) 500 MG tablet Take 500 mg by mouth daily.   04/11/2015 at 0800  . senna-docusate (SENOKOT-S) 8.6-50 MG tablet Take 2 tablets by mouth 2 (two) times daily.   04/11/2015 at 0800  . sertraline (ZOLOFT) 50 MG tablet Take 50 mg by mouth daily.   04/11/2015 at 0800  . simvastatin (ZOCOR) 20 MG  tablet Take 20 mg by mouth every evening.   04/10/2015 at 2000  . tamsulosin (FLOMAX) 0.4 MG CAPS capsule Take 1 capsule (0.4 mg total) by mouth daily. 30 capsule 3 04/11/2015 at 0800  . traZODone (DESYREL) 50 MG tablet Take 50 mg by mouth at bedtime.   04/10/2015 at 2000  . docusate sodium (COLACE) 100 MG capsule Take 1 capsule (100 mg total) by mouth 2 (two) times daily. (Patient not taking: Reported on 04/11/2015) 10 capsule 0 Taking  . enoxaparin (LOVENOX) 30 MG/0.3ML injection Inject 0.3 mLs (30 mg total) into the skin every 12 (twelve) hours. (Patient not taking: Reported on 02/03/2015) 42 Syringe 0 Not Taking  . metFORMIN (GLUCOPHAGE-XR) 500 MG 24 hr tablet Take 1 tablet (500 mg total) by mouth daily with breakfast. (Patient not taking: Reported on 04/11/2015) 30 tablet 0 Taking  . oxyCODONE (OXY IR/ROXICODONE) 5 MG immediate release tablet Take 1 tablet (5 mg total) by mouth every 4 (four) hours as needed for breakthrough pain ((for MODERATE breakthrough pain)). (Patient not taking: Reported on 04/11/2015) 30 tablet 0 Taking  . polyethylene glycol (MIRALAX / GLYCOLAX) packet Take 17 g by mouth daily as needed for mild constipation. (Patient not taking: Reported on 04/11/2015) 14 each 0 Taking   Scheduled:  . ampicillin-sulbactam (UNASYN) IV  1.5 g Intravenous Q6H  . feeding supplement (GLUCERNA SHAKE)  237 mL Oral TID WC  . ferrous sulfate  325 mg Oral BID WC  . insulin aspart  0-5 Units Subcutaneous QHS  . insulin aspart  0-9 Units Subcutaneous TID WC  . levothyroxine  75 mcg Oral Daily  . lisinopril  5 mg Oral Daily  . rOPINIRole  1 mg Oral QHS  . senna-docusate  2 tablet Oral BID  . sertraline  50 mg Oral Daily  . simvastatin  20 mg Oral QPM  . sodium chloride  3 mL Intravenous Q12H  . tamsulosin  0.4 mg Oral Daily  . traZODone  50 mg Oral QHS  . vancomycin  750 mg Intravenous Q12H   Assessment: Pharmacy consulted to dose and monitor Vancomycin and Unasyn in this patient being treated  for wound infection of the left heel which is gangrenous and draining pus. Patient previously on doxycyline.   Kel (hr-1): 0.041 Half-life (hrs): 16.91 Vd (liters): 37.80 (factor used: 0.7 L/kg)  Goal of Therapy:  Vancomycin trough level 15-20 mcg/ml  Plan:  Follow up culture results   Will give Vancomycin 750 mg IV x 1 then will start Vancomycin 750 mg IV q18 hours @ 18:00. Will order Vancomycin trough level @ 23:30 on 12/04.   12/4 23:00 vanc level 11. Changed to 750 mg  q 12 hours. Level ordered before 4th new dose.  James,Teldrin D 04/11/2015,10:43 AM

## 2015-04-14 NOTE — Progress Notes (Signed)
Enema given to pt per MD order. Pt had large BM.

## 2015-04-14 NOTE — Care Management (Signed)
Awating PT evaluation for further determination of discharge plan. Patient is from assisted living may need higher level of care pre attending. Dr Auburn BilberryShreyang Patel.

## 2015-04-14 NOTE — Progress Notes (Signed)
[facility] [place] at [facility]                                                                                                                                                                                            Patient Demographics   [name], is a 81 y.o. female, DOB - Jan 30, 1928, ZOX:[id number]  Admit date - 04/11/2015   Admitting Physician Alford Highland, MD  Outpatient Primary MD for the patient is CAMPBELL, MARGARET P, FNP   LOS - 3  Subjective: Complains of some pain in the leg no other complaints   Review of Systems:   CONSTITUTIONAL: No documented fever. No fatigue, weakness. No weight gain, no weight loss.  EYES: No blurry or double vision.  ENT: No tinnitus. No postnasal drip. No redness of the oropharynx.  RESPIRATORY: No cough, no wheeze, no hemoptysis. No dyspnea.  CARDIOVASCULAR: No chest pain. No orthopnea. No palpitations. No syncope.  GASTROINTESTINAL: No nausea, no vomiting or diarrhea. No abdominal pain. No melena or hematochezia.  GENITOURINARY: No dysuria or hematuria.  ENDOCRINE: No polyuria or nocturia. No heat or cold intolerance.  HEMATOLOGY: No anemia. No bruising. No bleeding.  INTEGUMENTARY: No rashes. No lesions.  MUSCULOSKELETAL: No arthritis. No swelling. No gout. Left foot pain NEUROLOGIC: No numbness, tingling, or ataxia. No seizure-type activity.  PSYCHIATRIC: No anxiety. No insomnia. No ADD.    Vitals:   Filed Vitals:   04/14/15 0108 04/14/15 0600 04/14/15 0935 04/14/15 1424  BP: 126/59 156/60 124/56 142/58  Pulse: 77 79 77 80  Temp: 98.4 F (36.9 C) 98.5 F (36.9 C) 99 F (37.2 C) 97.8 F (36.6 C)  TempSrc: Oral  Oral Oral  Resp: Height:      Weight:      SpO2: 97% 96% 99% 96%    Wt Readings from Last 3 Encounters:  04/11/15 53.326 kg (117 lb 9 oz)  02/08/15 54 kg (119 lb 0.8 oz)  01/07/15 63.095 kg (139 lb 1.6 oz)     Intake/Output Summary (Last 24 hours) at 04/14/15  1438 Last data filed at 04/14/15 1250  Gross per 24 hour  Intake 2175.67 ml  Output   1125 ml  Net 1050.67 ml    Physical Exam:   GENERAL: Pleasant-appearing in no apparent distress.  HEAD, EYES, EARS, NOSE AND THROAT: Atraumatic, normocephalic. Extraocular muscles are intact. Pupils equal and reactive to light. Sclerae anicteric. No conjunctival injection. No oro-pharyngeal erythema.  NECK: Supple. There is no jugular venous distention. No bruits, no lymphadenopathy, no thyromegaly.  HEART: Regular rate and rhythm,. No murmurs, no rubs,  no clicks.  LUNGS: Clear to auscultation bilaterally. No rales or rhonchi. No wheezes.  ABDOMEN: Soft, flat, nontender, nondistended. Has good bowel sounds. No hepatosplenomegaly appreciated.  EXTREMITIES: No evidence of any cyanosis, clubbing, or peripheral edema.  Patient has dressing on the left foot  NEUROLOGIC: The patient is alert, awake, and oriented x3 with no focal motor or sensory deficits appreciated bilaterally.  SKIN: Moist and warm with no rashes appreciated.  Psych: Not anxious, depressed LN: No inguinal LN enlargement    Antibiotics   Anti-infectives    Start     Dose/Rate Route Frequency Ordered Stop   04/14/15 1400  vancomycin (VANCOCIN) IVPB 750 mg/150 ml premix     750 mg 150 mL/hr over 60 Minutes Intravenous Every 12 hours 04/14/15 0243     04/12/15 1100  vancomycin (VANCOCIN) powder  Status:  Discontinued       As needed 04/12/15 1100 04/12/15 1116   04/11/15 1800  vancomycin (VANCOCIN) IVPB 750 mg/150 ml premix  Status:  Discontinued     750 mg 150 mL/hr over 60 Minutes Intravenous Every 18 hours 04/11/15 1040 04/14/15 0243   04/11/15 1100  ampicillin-sulbactam (UNASYN) 1.5 g in sodium chloride 0.9 % 50 mL IVPB     1.5 g 100 mL/hr over 30 Minutes Intravenous Every 6 hours 04/11/15 1028     04/11/15 1045  vancomycin (VANCOCIN) IVPB 750 mg/150 ml premix     750 mg 150 mL/hr over 60 Minutes Intravenous STAT 04/11/15 1038  04/11/15 1406      Medications   Scheduled Meds: . ampicillin-sulbactam (UNASYN) IV  1.5 g Intravenous Q6H  . feeding supplement (GLUCERNA SHAKE)  237 mL Oral TID WC  . ferrous sulfate  325 mg Oral BID WC  . insulin aspart  0-5 Units Subcutaneous QHS  . insulin aspart  0-9 Units Subcutaneous TID WC  . levothyroxine  75 mcg Oral Daily  . lisinopril  5 mg Oral Daily  . rOPINIRole  1 mg Oral QHS  . senna-docusate  2 tablet Oral BID  . sertraline  50 mg Oral Daily  . simvastatin  20 mg Oral QPM  . sodium chloride  3 mL Intravenous Q12H  . tamsulosin  0.4 mg Oral Daily  . traZODone  50 mg Oral QHS  . vancomycin  750 mg Intravenous Q12H   Continuous Infusions: . sodium chloride 50 mL/hr at 04/14/15 1230   PRN Meds:.sodium chloride, acetaminophen **OR** acetaminophen, HYDROcodone-acetaminophen, morphine injection, oxyCODONE, sodium chloride   Data Review:   Micro Results Recent Results (from the past 240 hour(s))  Wound culture     Status: None (Preliminary result)   Collection Time: 04/10/15  7:00 PM  Result Value Ref Range Status   Specimen Description FOOT  Final   Special Requests Normal  Final   Gram Stain NO WBC SEEN NO ORGANISMS SEEN   Final   Culture NORMAL SKIN FLORA  Final   Report Status PENDING  Incomplete  MRSA PCR Screening     Status: None   Collection Time: 04/12/15  1:56 AM  Result Value Ref Range Status   MRSA by PCR NEGATIVE NEGATIVE Final    Comment:        The GeneXpert MRSA Assay (FDA approved for NASAL specimens only), is one component of a comprehensive MRSA colonization surveillance program. It is not intended to diagnose MRSA infection nor to guide or monitor treatment for MRSA infections.   Anaerobic culture     Status: None (Preliminary  result)   Collection Time: 04/12/15 10:45 AM  Result Value Ref Range Status   Specimen Description WOUND  Final   Special Requests NONE  Final   Gram Stain PENDING  Incomplete   Culture HOLDING FOR  POSSIBLE PATHOGEN  Final   Report Status PENDING  Incomplete  Wound culture     Status: None (Preliminary result)   Collection Time: 04/12/15 10:45 AM  Result Value Ref Range Status   Specimen Description WOUND  Final   Special Requests NONE  Final   Gram Stain   Final    FEW WBC SEEN RARE GRAM POSITIVE COCCI IN PAIRS RARE GRAM NEGATIVE RODS    Culture HOLDING FOR POSSIBLE PATHOGEN  Final   Report Status PENDING  Incomplete    Radiology Reports Dg Foot Complete Left  04/11/2015  CLINICAL DATA:  Soft tissue infection calcaneal region. Diabetes mellitus. EXAM: LEFT FOOT - COMPLETE 3+ VIEW COMPARISON:  None. FINDINGS: Frontal, oblique, and lateral views were obtained. There is soft tissue irregularity posterior to the calcaneus consistent with cellulitis. There is no frank bony destruction. Bones are osteoporotic. There is no acute fracture or dislocation. There is moderate osteoarthritic change in all MTP, PIP, and DIP joints. There is extensive arterial vascular calcifications in the foot region. IMPRESSION: Soft tissue prominence and thickening posterior to the calcaneus, likely due to cellulitis. No erosive change or bony destruction is appreciable. Bones are osteoporotic. Multilevel osteoarthritic change. Extensive arterial vascular calcification. No acute fracture or dislocation. If there remains concern for potential osteomyelitis, either nuclear medicine three-phase bone scan or MR could be helpful for further assessment. Electronically Signed   By: Bretta Bang III M.D.   On: 04/11/2015 10:19     CBC  Recent Labs Lab 04/11/15 0927 04/12/15 0610 04/13/15 0551 04/14/15 0452  WBC 7.9 5.3 6.7 5.9  HGB 11.2* 11.1* 9.3* 10.2*  HCT 34.6* 33.1* 29.0* 31.0*  PLT 360 304 264 281  MCV 82.1 81.7 81.3 81.8  MCH 26.6 27.3 26.2 26.8  MCHC 32.4 33.4 32.2 32.8  RDW 15.2* 15.5* 15.1* 15.5*  LYMPHSABS 1.6  --   --   --   MONOABS 0.5  --   --   --   EOSABS 0.1  --   --   --   BASOSABS  0.1  --   --   --     Chemistries   Recent Labs Lab 04/11/15 0927 04/12/15 0610 04/13/15 0551  NA 137 138 135  K 4.7 4.5 4.3  CL 101 104 104  CO2 26 27 27   GLUCOSE 198* 136* 161*  BUN 20 15 9   CREATININE 0.73 0.69 0.61  CALCIUM 9.3 8.9 8.3*   ------------------------------------------------------------------------------------------------------------------ estimated creatinine clearance is 42.5 mL/min (by C-G formula based on Cr of 0.61). ------------------------------------------------------------------------------------------------------------------ No results for input(s): HGBA1C in the last 72 hours. ------------------------------------------------------------------------------------------------------------------ No results for input(s): CHOL, HDL, LDLCALC, TRIG, CHOLHDL, LDLDIRECT in the last 72 hours. ------------------------------------------------------------------------------------------------------------------ No results for input(s): TSH, T4TOTAL, T3FREE, THYROIDAB in the last 72 hours.  Invalid input(s): FREET3 ------------------------------------------------------------------------------------------------------------------ No results for input(s): VITAMINB12, FOLATE, FERRITIN, TIBC, IRON, RETICCTPCT in the last 72 hours.  Coagulation profile No results for input(s): INR, PROTIME in the last 168 hours.  No results for input(s): DDIMER in the last 72 hours.  Cardiac Enzymes No results for input(s): CKMB, TROPONINI, MYOGLOBIN in the last 168 hours.  Invalid input(s): CK ------------------------------------------------------------------------------------------------------------------ Invalid input(s): POCBNP    Assessment & Plan   1. Gangrene left heel.  Status post incision and drainage changed to oral anabiotic  2. Type 2 diabetes- sliding-scale, hemoglobin A1c elevated at 6.6 needs diet controlBlood sugars likely elevated due to him dextrose in antibiotics   3. Essential hypertension continue lisinopril 4. Hyperlipidemia unspecified continue Zocor 5. Hypothyroidism unspecified continue levothyroxin 6. Alzheimer's dementia without behavioral disturbance     Code Status Orders        Start     Ordered   04/12/15 1216  Full code   Continuous     04/12/15 1215           Consults  none  DVT Prophylaxis heparin  Lab Results  Component Value Date   PLT 281 04/14/2015     Time Spent in minutes   32min  Kimyetta Flott M.D on 04/14/2015 at 2:38 PM  Between 7am to 6pm - Pager - [phone]  After 6pm go to [email or web address] - password [email or web address]  [facility]   Office  [phone]

## 2015-04-14 NOTE — Discharge Instructions (Signed)
°  DIET:  Diabetic diet  DISCHARGE CONDITION:  Stable  ACTIVITY:  Activity as tolerated  OXYGEN:  Home Oxygen: No.   Oxygen Delivery: room air  DISCHARGE LOCATION:  nursing home    ADDITIONAL DISCHARGE INSTRUCTION:pressure relief boot , leave dressing in place until seen by podiatry in one week   If you experience worsening of your admission symptoms, develop shortness of breath, life threatening emergency, suicidal or homicidal thoughts you must seek medical attention immediately by calling 911 or calling your MD immediately  if symptoms less severe.  You Must read complete instructions/literature along with all the possible adverse reactions/side effects for all the Medicines you take and that have been prescribed to you. Take any new Medicines after you have completely understood and accpet all the possible adverse reactions/side effects.   Please note  You were cared for by a hospitalist during your hospital stay. If you have any questions about your discharge medications or the care you received while you were in the hospital after you are discharged, you can call the unit and asked to speak with the hospitalist on call if the hospitalist that took care of you is not available. Once you are discharged, your primary care physician will handle any further medical issues. Please note that NO REFILLS for any discharge medications will be authorized once you are discharged, as it is imperative that you return to your primary care physician (or establish a relationship with a primary care physician if you do not have one) for your aftercare needs so that they can reassess your need for medications and monitor your lab values.

## 2015-04-14 NOTE — Care Management Important Message (Signed)
Important Message  Patient Details  Name: [name] MRN: [id number] Date of Birth: 1928/03/27   Medicare Important Message Given:  Yes    Olegario MessierKathy A Chrisanne Loose 04/14/2015, 10:08 AM

## 2015-04-14 NOTE — Progress Notes (Signed)
IV was removed. Pt was changed into cloth gown and a clean diaper was put on the pt. Awaiting for EMS transport to [facility].

## 2015-04-14 NOTE — Clinical Social Work Note (Signed)
Clinical Social Work Assessment  Patient Details  Name: [name] MRN: [id number] Date of Birth: Dec 01, 1927  Date of referral:  04/11/15               Reason for consult:  Facility Placement                Permission sought to share information with:  Case Manager, Magazine features editoracility Contact Representative, Family Supports Permission granted to share information::  Yes, Verbal Permission Granted  Name::     Daughter Elouise MunroeMarilyn Carfagno  Agency::  [facility], SNFs  Relationship::     Contact Information:     Housing/Transportation Living arrangements for the past 2 months:  [facility] Source of Information:  Patient, Medical Team, Adult Children, Facility Patient Interpreter Needed:  None Criminal Activity/Legal Involvement Pertinent to Current Situation/Hospitalization:  No - Comment as needed Significant Relationships:  Adult Children, Friend Lives with:  Facility Resident Do you feel safe going back to the place where you live?  Yes Need for family participation in patient care:  Yes (Comment) (Patient has dementia, unable to participate in dc planning.)  Care giving concerns:  Pt is from [facility]F [facility]. Pt has been evaluated by PT and they are recommending SNF. Pt also has worsening wound on her heal.    Social Worker assessment / plan:  CSW was referred to Pt to assist with dc coordination. Pt is from [facility]F [facility] where she has lived for 2 years. Prior to ALF, she was living in an apartment by herself. Pt has been widowed for many years. She and her husband had 3 children, 1 son "her favorite" died 20 years ago. Pt's daughter and other son live in [place]. Pt has been evaluated and recommendation is for STR at SNF. Pt was at [facility] after breaking her hip in August. CSW followed up with [facility] and Pt's daughter to discuss options, everyone in agreement with the recommendation of SNF. CSW faxed Pt out to SNFs and [facility] is able to offer Pt a bed  again. CSW confirmed with Pt's daughter that she would like Pt to return to [facility].   Employment status:  Retired Health and safety inspectornsurance information:  Armed forces operational officerMedicare, [place]  Skilled Nursing Facility Information / Referral to community resources:  Skilled Nursing Facility  Patient/Family's Response to care:  Pt has been emotional in the hospital. She is tearful at times and without obvious reason to staff. CSW spoke to ALF and Pt's daughter to learn that this is Pt's baseline. Pt is on medication for her mood. ALF liaison states that Pt has had some improvement in her mood while she has been at ALF.  Patient/Family's Understanding of and Emotional Response to Diagnosis, Current Treatment, and Prognosis:  Pt's daughter is engaged with providers and demonstrates in conversation basic knowledge of her mother's condition and ongoing illness. Pt's daughter relies on the facility to update her as she is not able to travel to visit her mother as often as she would like and Pt is not able to relay information accurately.   Emotional Assessment Appearance:  Appears stated age Attitude/Demeanor/Rapport:  Apprehensive, Reactive Affect (typically observed):  Anxious, Tearful/Crying, Irritable, Overwhelmed Orientation:  Oriented to Self, Oriented to Place, Oriented to Situation Alcohol / Substance use:  Never Used Psych involvement (Current and /or in the community):  No (Comment)  Discharge Needs  Concerns to be addressed:  Adjustment to Illness, Discharge Planning Concerns Readmission within the last 30 days:  No Current discharge risk:  Cognitively Impaired Barriers to Discharge:  Barriers Resolved   Ned Card, LCSW 04/14/2015, 3:33 PM

## 2015-04-15 LAB — WOUND CULTURE
Culture: NORMAL
Gram Stain: NONE SEEN
Special Requests: NORMAL

## 2015-04-16 NOTE — Progress Notes (Signed)
TKEYA, STENCIL ([id number]) Visit Report for 04/11/2015 Arrival Information Details Patient Name: [name], [name] Date of Service: 04/11/2015 8:00 AM Medical Record Number: [id number] Patient Account Number: [id number] Date of Birth/Sex: 05-18-1927 (81 y.o. Female) Treating RN: Ashok Cordia, Debi Primary Care Physician: Loney Laurence Other Clinician: Referring Physician: Loney Laurence Treating Physician/Extender: Rudene Re in Treatment: 6 Visit Information History Since Last Visit All ordered tests and consults were completed: No Patient Arrived: Walker Added or deleted any medications: No Arrival Time: 08:14 Any new allergies or adverse reactions: No Accompanied By: caregiver Had a fall or experienced change in No Transfer Assistance: EasyPivot Patient activities of daily living that may affect Lift risk of falls: Patient Identification Verified: Yes Signs or symptoms of abuse/neglect since last No Secondary Verification Process Yes visito Completed: Hospitalized since last visit: No Patient Requires Transmission- No Pain Present Now: No Based Precautions: Patient Has Alerts: Yes Patient Alerts: Patient on Blood Thinner ABI: Non Compressible Electronic Signature(s) Signed: 04/15/2015 6:00:56 PM By: Alejandro Mulling Entered By: Alejandro Mulling on 04/11/2015 08:15:21 Munch, [name] ([id number]) -------------------------------------------------------------------------------- Clinic Level of Care Assessment Details Patient Name: [name] Date of Service: 04/11/2015 8:00 AM Medical Record Number: [id number] Patient Account Number: [id number] Date of Birth/Sex: 09-27-1927 (81 y.o. Female) Treating RN: Ashok Cordia, Debi Primary Care Physician: Loney Laurence Other Clinician: Referring Physician: Loney Laurence Treating Physician/Extender: Rudene Re in Treatment: 6 Clinic Level of Care Assessment Items TOOL 4 Quantity Score   - Use when only an EandM is performed on FOLLOW-UP visit 0 ASSESSMENTS - Nursing Assessment / Reassessment  - Reassessment of Co-morbidities (includes updates in patient status) 0  - Reassessment of Adherence to Treatment Plan 0 ASSESSMENTS - Wound and Skin Assessment / Reassessment X - Simple Wound Assessment / Reassessment - one wound 1 5  - Complex Wound Assessment / Reassessment - multiple wounds 0  - Dermatologic / Skin Assessment (not related to wound area) 0 ASSESSMENTS - Focused Assessment  - Circumferential Edema Measurements - multi extremities 0  - Nutritional Assessment / Counseling / Intervention 0  - Lower Extremity Assessment (monofilament, tuning fork, pulses) 0  - Peripheral Arterial Disease Assessment (using hand held doppler) 0 ASSESSMENTS - Ostomy and/or Continence Assessment and Care  - Incontinence Assessment and Management 0  - Ostomy Care Assessment and Management (repouching, etc.) 0 PROCESS - Coordination of Care  - Simple Patient / Family Education for ongoing care 0 X - Complex (extensive) Patient / Family Education for ongoing care 1 20  - Staff obtains Chiropractor, Records, Test Results / Process Orders 0  - Staff telephones HHA, Nursing Homes / Clarify orders / etc 0  - Routine Transfer to another Facility (non-emergent condition) 0 Deshong, [name] ([id number])  - Routine Hospital Admission (non-emergent condition) 0  - New Admissions / Manufacturing engineer / Ordering NPWT, Apligraf, etc. 0  - Emergency Hospital Admission (emergent condition) 0 X - Simple Discharge Coordination 1 10  - Complex (extensive) Discharge Coordination 0 PROCESS - Special Needs  - Pediatric / Minor Patient Management 0  - Isolation Patient Management 0  - Hearing / Language / Visual special needs 0  - Assessment of Community assistance (transportation, D/C planning, etc.) 0  - Additional assistance / Altered mentation 0  -  Support Surface(s) Assessment (bed, cushion, seat, etc.) 0 INTERVENTIONS - Wound Cleansing / Measurement X - Simple Wound Cleansing - one wound 1 5  - Complex Wound Cleansing - multiple wounds 0  - Wound Imaging (photographs - any number of  wounds) 0 []  - Wound Tracing (instead of photographs) 0 []  - Simple Wound Measurement - one wound 0 []  - Complex Wound Measurement - multiple wounds 0 INTERVENTIONS - Wound Dressings X - Small Wound Dressing one or multiple wounds 1 10 []  - Medium Wound Dressing one or multiple wounds 0 []  - Large Wound Dressing one or multiple wounds 0 []  - Application of Medications - topical 0 []  - Application of Medications - injection 0 INTERVENTIONS - Miscellaneous []  - External ear exam 0 Catena, [name] ([id number]) []  - Specimen Collection (cultures, biopsies, blood, body fluids, etc.) 0 []  - Specimen(s) / Culture(s) sent or taken to Lab for analysis 0 []  - Patient Transfer (multiple staff / Michiel Sites Lift / Similar devices) 0 []  - Simple Staple / Suture removal (25 or less) 0 []  - Complex Staple / Suture removal (26 or more) 0 []  - Hypo / Hyperglycemic Management (close monitor of Blood Glucose) 0 []  - Ankle / Brachial Index (ABI) - do not check if billed separately 0 X - Vital Signs 1 5 Has the patient been seen at the hospital within the last three years: Yes Total Score: 55 Level Of Care: New/Established - Level 2 Electronic Signature(s) Signed: 04/15/2015 6:00:56 PM By: Alejandro Mulling Entered By: Alejandro Mulling on 04/11/2015 08:35:13 Patchell, [name] ([id number]) -------------------------------------------------------------------------------- Encounter Discharge Information Details Patient Name: [name] Date of Service: 04/11/2015 8:00 AM Medical Record Number: [id number] Patient Account Number: [id number] Date of Birth/Sex: 1928-05-02 (81 y.o. Female) Treating RN: Ashok Cordia, Debi Primary Care Physician: Loney Laurence Other  Clinician: Referring Physician: Loney Laurence Treating Physician/Extender: Rudene Re in Treatment: 6 Encounter Discharge Information Items Discharge Pain Level: 8 Discharge Condition: Stable Ambulatory Status: Walker Emergency Discharge Destination: Room Transportation: Private Auto Accompanied By: caregiver Schedule Follow-up Appointment: Yes Medication Reconciliation completed and provided to Patient/Care Yes Hadyn Azer: Provided on Clinical Summary of Care: 04/11/2015 Form Type Recipient Paper Patient BM Electronic Signature(s) Signed: 04/11/2015 8:36:56 AM By: Gwenlyn Perking Entered By: Gwenlyn Perking on 04/11/2015 08:36:56 Tiso, [name] ([id number]) -------------------------------------------------------------------------------- Lower Extremity Assessment Details Patient Name: [name] Date of Service: 04/11/2015 8:00 AM Medical Record Number: [id number] Patient Account Number: [id number] Date of Birth/Sex: [birth year] (81 y.o. Female) Treating RN: Ashok Cordia, Debi Primary Care Physician: Loney Laurence Other Clinician: Referring Physician: Loney Laurence Treating Physician/Extender: Rudene Re in Treatment: 6 Vascular Assessment Pulses: Posterior Tibial Dorsalis Pedis Palpable: [Left:Yes] Extremity colors, hair growth, and conditions: Extremity Color: [Left:Normal] Hair Growth on Extremity: [Left:No] Temperature of Extremity: [Left:Warm] Capillary Refill: [Left:< 3 seconds] Toe Nail Assessment Left: Right: Thick: Yes Discolored: Yes Deformed: No Improper Length and Hygiene: No Electronic Signature(s) Signed: 04/15/2015 6:00:56 PM By: Alejandro Mulling Entered By: Alejandro Mulling on 04/11/2015 08:25:14 [name], [name] ([id number]) -------------------------------------------------------------------------------- Multi Wound Chart Details Patient Name: [name] Date of Service: 04/11/2015 8:00 AM Medical Record  Number: [id number] Patient Account Number: [id number] Date of Birth/Sex: 01-26-1928 (81 y.o. Female) Treating RN: Ashok Cordia, Debi Primary Care Physician: Loney Laurence Other Clinician: Referring Physician: Loney Laurence Treating Physician/Extender: Rudene Re in Treatment: 6 Vital Signs Height(in): 64 Pulse(bpm): 89 Weight(lbs): Blood Pressure 133/64 (mmHg): Body Mass Index(BMI): Temperature(F): 98.2 Respiratory Rate 18 (breaths/min): Photos: [1:No Photos] [2:No Photos] [N/A:N/A] Wound Location: [1:Left Calcaneous] [2:Left, Dorsal Foot] [N/A:N/A] Wounding Event: [1:Pressure Injury] [2:Pressure Injury] [N/A:N/A] Primary Etiology: [1:Pressure Ulcer] [2:Pressure Ulcer] [N/A:N/A] Comorbid History: [1:Anemia, Hypertension, Type II Diabetes, History of pressure wounds, Osteoarthritis, Dementia] [2:N/A] [N/A:N/A] Date Acquired: [1:01/30/2015] [2:01/30/2015] [N/A:N/A] Weeks of Treatment: [1:6] [2:6] [N/A:N/A] Wound Status: [1:Open] [  2:Healed - Epithelialized] [N/A:N/A] Measurements L x W x D 4.5x4.6x0.1 [2:0x0x0] [N/A:N/A] (cm) Area (cm) : [1:16.258] [2:0] [N/A:N/A] Volume (cm) : [1:1.626] [2:0] [N/A:N/A] % Reduction in Area: [1:-69.00%] [2:100.00%] [N/A:N/A] % Reduction in Volume: -69.00% [2:100.00%] [N/A:N/A] Classification: [1:Unstageable/Unclassified] [2:Category/Stage II] [N/A:N/A] HBO Classification: [1:Grade 1] [2:N/A] [N/A:N/A] Exudate Amount: [1:Small] [2:N/A] [N/A:N/A] Exudate Type: [1:Serosanguineous] [2:N/A] [N/A:N/A] Exudate Color: [1:red, brown] [2:N/A] [N/A:N/A] Wound Margin: [1:Indistinct, nonvisible] [2:N/A] [N/A:N/A] Granulation Amount: [1:None Present (0%)] [2:N/A] [N/A:N/A] Necrotic Amount: [1:Large (67-100%)] [2:N/A] [N/A:N/A] Necrotic Tissue: [1:Eschar, Adherent Slough] [2:N/A] [N/A:N/A] Exposed Structures: [1:Fascia: No Fat: No Tendon: No Muscle: No] [2:N/A] [N/A:N/A] Joint: No Bone: No Limited to Skin Breakdown Epithelialization:  None N/A N/A Periwound Skin Texture: Edema: Yes No Abnormalities Noted N/A Excoriation: No Induration: No Callus: No Crepitus: No Fluctuance: No Friable: No Rash: No Scarring: No Periwound Skin Maceration: No No Abnormalities Noted N/A Moisture: Moist: No Dry/Scaly: No Periwound Skin Color: Atrophie Blanche: No No Abnormalities Noted N/A Cyanosis: No Ecchymosis: No Erythema: No Hemosiderin Staining: No Mottled: No Pallor: No Rubor: No Temperature: No Abnormality N/A N/A Tenderness on Yes No N/A Palpation: Wound Preparation: Ulcer Cleansing: N/A N/A Rinsed/Irrigated with Saline Topical Anesthetic Applied: Other: lidocaine 4% Treatment Notes Electronic Signature(s) Signed: 04/15/2015 6:00:56 PM By: Alejandro MullingPinkerton, [name] Entered By: Alejandro MullingPinkerton, [name] on 04/11/2015 08:27:53 Geron, [name] ([id number]) -------------------------------------------------------------------------------- Multi-Disciplinary Care Plan Details Patient Name: Gloria FateMORDECAI, [name] Date of Service: 04/11/2015 8:00 AM Medical Record Number: [id number] Patient Account Number: [id number] Date of Birth/Sex: 04-Jun-1927 (81 y.o. Female) Treating RN: Ashok CordiaPinkerton, Debi Primary Care Physician: Loney LaurenceAMPBELL, MARGARET Other Clinician: Referring Physician: Loney LaurenceAMPBELL, MARGARET Treating Physician/Extender: Rudene ReBritto, Errol Weeks in Treatment: 6 Active Inactive Orientation to the Wound Care Program Nursing Diagnoses: Knowledge deficit related to the wound healing center program Goals: Patient/caregiver will verbalize understanding of the Wound Healing Center Program Date Initiated: 02/27/2015 Goal Status: Active Interventions: Provide education on orientation to the wound center Notes: Pressure Nursing Diagnoses: Knowledge deficit related to management of pressures ulcers Potential for impaired tissue integrity related to pressure, friction, moisture, and shear Goals: Patient will remain free from development of  additional pressure ulcers Date Initiated: 02/27/2015 Goal Status: Active Patient will remain free of pressure ulcers Date Initiated: 02/27/2015 Goal Status: Active Patient/caregiver will verbalize risk factors for pressure ulcer development Date Initiated: 02/27/2015 Goal Status: Active Patient/caregiver will verbalize understanding of pressure ulcer management Date Initiated: 02/27/2015 Goal Status: Active Interventions: Assess: immobility, friction, shearing, incontinence upon admission and as needed Willers, Seniya ([id number]) Assess offloading mechanisms upon admission and as needed Assess potential for pressure ulcer upon admission and as needed Provide education on pressure ulcers Treatment Activities: Patient referred for home evaluation of offloading devices/mattresses : 04/11/2015 Patient referred for pressure reduction/relief devices : 04/11/2015 Notes: Wound/Skin Impairment Nursing Diagnoses: Impaired tissue integrity Knowledge deficit related to ulceration/compromised skin integrity Goals: Patient/caregiver will verbalize understanding of skin care regimen Date Initiated: 02/27/2015 Goal Status: Active Ulcer/skin breakdown will have a volume reduction of 30% by week 4 Date Initiated: 02/27/2015 Goal Status: Active Ulcer/skin breakdown will have a volume reduction of 50% by week 8 Date Initiated: 02/27/2015 Goal Status: Active Ulcer/skin breakdown will have a volume reduction of 80% by week 12 Date Initiated: 02/27/2015 Goal Status: Active Ulcer/skin breakdown will heal within 14 weeks Date Initiated: 02/27/2015 Goal Status: Active Interventions: Assess patient/caregiver ability to obtain necessary supplies Assess patient/caregiver ability to perform ulcer/skin care regimen upon admission and as needed Assess ulceration(s) every visit Provide education on ulcer and skin care Treatment Activities: Skin care  regimen initiated : 04/11/2015 Notes: Electronic  Signature(s) CLIO, [name] ([id number]) Signed: 04/15/2015 6:00:56 PM By: Alejandro Mulling Entered By: Alejandro Mulling on 04/11/2015 08:27:44 Haider, [name] ([id number]) -------------------------------------------------------------------------------- Pain Assessment Details Patient Name: [name] Date of Service: 04/11/2015 8:00 AM Medical Record Number: [id number] Patient Account Number: [id number] Date of Birth/Sex: Aug 26, 1927 (81 y.o. Female) Treating RN: Ashok Cordia, Debi Primary Care Physician: Loney Laurence Other Clinician: Referring Physician: Loney Laurence Treating Physician/Extender: Rudene Re in Treatment: 6 Active Problems Location of Pain Severity and Description of Pain Patient Has Paino No Site Locations Pain Management and Medication Current Pain Management: Electronic Signature(s) Signed: 04/15/2015 6:00:56 PM By: Alejandro Mulling Entered By: Alejandro Mulling on 04/11/2015 08:15:28 Reale, [name] ([id number]) -------------------------------------------------------------------------------- Patient/Caregiver Education Details Patient Name: [name] Date of Service: 04/11/2015 8:00 AM Medical Record Number: [id number] Patient Account Number: [id number] Date of Birth/Gender: Mar 01, 1928 (81 y.o. Female) Treating RN: Ashok Cordia, Debi Primary Care Physician: Loney Laurence Other Clinician: Referring Physician: Loney Laurence Treating Physician/Extender: Rudene Re in Treatment: 6 Education Assessment Education Provided To: Patient and Caregiver Education Topics Provided Wound/Skin Impairment: Handouts: Other: Go to ER rfor evaluation Methods: Explain/Verbal Responses: State content correctly Electronic Signature(s) Signed: 04/15/2015 6:00:56 PM By: Alejandro Mulling Entered By: Alejandro Mulling on 04/11/2015 08:36:58 Llorens, [name]  ([id number]) -------------------------------------------------------------------------------- Wound Assessment Details Patient Name: [name] Date of Service: 04/11/2015 8:00 AM Medical Record Number: [id number] Patient Account Number: [id number] Date of Birth/Sex: 03-12-1928 (81 y.o. Female) Treating RN: Ashok Cordia, Debi Primary Care Physician: Loney Laurence Other Clinician: Referring Physician: Loney Laurence Treating Physician/Extender: Rudene Re in Treatment: 6 Wound Status Wound Number: 1 Primary Pressure Ulcer Etiology: Wound Location: Left Calcaneous Wound Open Wounding Event: Pressure Injury Status: Date Acquired: 01/30/2015 Comorbid Anemia, Hypertension, Type II Weeks Of Treatment: 6 History: Diabetes, History of pressure wounds, Clustered Wound: No Osteoarthritis, Dementia Photos Photo Uploaded By: Alejandro Mulling on 04/11/2015 17:07:21 Wound Measurements Length: (cm) 4.5 Width: (cm) 4.6 Depth: (cm) 0.1 Area: (cm) 16.258 Volume: (cm) 1.626 % Reduction in Area: -69% % Reduction in Volume: -69% Epithelialization: None Tunneling: No Undermining: No Wound Description Classification: Unstageable/Unclassified Foul Od Diabetic Severity (Wagner): Grade 1 Wound Margin: Indistinct, nonvisible Exudate Amount: Small Exudate Type: Serosanguineous Exudate Color: red, brown or After Cleansing: No Wound Bed Granulation Amount: None Present (0%) Exposed Structure Necrotic Amount: Large (67-100%) Fascia Exposed: No Necrotic Quality: Eschar, Adherent Slough Fat Layer Exposed: No J[place]oeur, [name] ([id number]) Tendon Exposed: No Muscle Exposed: No Joint Exposed: No Bone Exposed: No Limited to Skin Breakdown Periwound Skin Texture Texture Color No Abnormalities Noted: No No Abnormalities Noted: No Callus: No Atrophie Blanche: No Crepitus: No Cyanosis: No Excoriation: No Ecchymosis: No Fluctuance: No Erythema: No Friable:  No Hemosiderin Staining: No Induration: No Mottled: No Localized Edema: Yes Pallor: No Rash: No Rubor: No Scarring: No Temperature / Pain Moisture Temperature: No Abnormality No Abnormalities Noted: No Tenderness on Palpation: Yes Dry / Scaly: No Maceration: No Moist: No Wound Preparation Ulcer Cleansing: Rinsed/Irrigated with Saline Topical Anesthetic Applied: Other: lidocaine 4%, Treatment Notes Wound #1 (Left Calcaneous) 1. Cleansed with: Clean wound with Normal Saline 2. Anesthetic Topical Lidocaine 4% cream to wound bed prior to debridement 4. Dressing Applied: Dry Gauze 5. Secondary Dressing Applied Kerlix/Conform 7. Secured with Tape Notes painted wound with betadine Electronic Signature(s) Signed: 04/15/2015 6:00:56 PM By: Alejandro Mulling Entered By: Alejandro Mulling on 04/11/2015 08:23:48 LORAE, [name] ([id number]) [place], [name] ([id number]) -------------------------------------------------------------------------------- Wound Assessment Details Patient Name: [name] Date of Service: 04/11/2015 8:00 AM Medical Record  Number: [id number] Patient Account Number: [id number] Date of Birth/Sex: 1927-09-26 (81 y.o. Female) Treating RN: Ashok Cordia, Debi Primary Care Physician: Loney Laurence Other Clinician: Referring Physician: Loney Laurence Treating Physician/Extender: Rudene Re in Treatment: 6 Wound Status Wound Number: 2 Primary Etiology: Pressure Ulcer Wound Location: Left, Dorsal Foot Wound Status: Healed - Epithelialized Wounding Event: Pressure Injury Date Acquired: 01/30/2015 Weeks Of Treatment: 6 Clustered Wound: No Photos Photo Uploaded By: Alejandro Mulling on 04/11/2015 17:07:21 Wound Measurements Length: (cm) 0 % Reduction Width: (cm) 0 % Reduction Depth: (cm) 0 Area: (cm) 0 Volume: (cm) 0 in Area: 100% in Volume: 100% Wound Description Classification: Category/Stage II Periwound Skin  Texture Texture Color No Abnormalities Noted: No No Abnormalities Noted: No Moisture No Abnormalities Noted: No Electronic Signature(s) Signed: 04/15/2015 6:00:56 PM By: Norman Herrlich, [name] ([id number]) Entered By: Alejandro Mulling on 04/11/2015 08:24:12 Baumgart, [name] ([id number]) -------------------------------------------------------------------------------- Vitals Details Patient Name: [name] Date of Service: 04/11/2015 8:00 AM Medical Record Number: [id number] Patient Account Number: [id number] Date of Birth/Sex: Aug 12, 1927 (81 y.o. Female) Treating RN: Ashok Cordia, Debi Primary Care Physician: Loney Laurence Other Clinician: Referring Physician: Loney Laurence Treating Physician/Extender: Rudene Re in Treatment: 6 Vital Signs Time Taken: 08:15 Temperature (F): 98.2 Height (in): 64 Pulse (bpm): 89 Respiratory Rate (breaths/min): 18 Blood Pressure (mmHg): 133/64 Reference Range: 80 - 120 mg / dl Electronic Signature(s) Signed: 04/15/2015 6:00:56 PM By: Alejandro Mulling Entered By: Alejandro Mulling on 04/11/2015 08:16:04

## 2015-04-16 NOTE — Progress Notes (Signed)
LANA, FLAIM ([id number]) Visit Report for 04/11/2015 Chief Complaint Document Details Patient Name: [name], [name] Date of Service: 04/11/2015 8:00 AM Medical Record Patient Account Number: [id number] Number: Afful, RN, BSN, Treating RN: 10-25-1927 (81 y.o. [place] S[place] Date of Birth/Sex: Female) Other Clinician: Primary Care Physician: CAMPBELL, MARGARET Treating Darryle Dennie Referring Physician: Loney Laurence Physician/Extender: Tania Ade in Treatment: 6 Information Obtained from: Patient Chief Complaint Patient is at the clinic for treatment of an open pressure ulcer. Recently noticed to have a pressure injury to the left heel for about 2 weeks now. Electronic Signature(s) Signed: 04/11/2015 8:36:52 AM By: Evlyn Kanner MD, FACS Entered By: Evlyn Kanner on 04/11/2015 08:36:52 Villers, [name] ([id number]) -------------------------------------------------------------------------------- HPI Details Patient Name: [name] Date of Service: 04/11/2015 8:00 AM Medical Record Patient Account Number: [id number] Number: Afful, RN, BSN, Treating RN: 13-Sep-1927 (81 y.o. [place] Sink Date of Birth/Sex: Female) Other Clinician: Primary Care Physician: CAMPBELL, MARGARET Treating Rhemi Balbach Referring Physician: Loney Laurence Physician/Extender: Weeks in Treatment: 6 History of Present Illness Location: left heel and dorsum of the foot Quality: Patient reports No Pain. Severity: Patient states wound (s) are getting better. Duration: Patient has had the wound for < 2 weeks prior to presenting for treatment Timing: Pain in wound is Intermittent (comes and goes Context: The wound appeared gradually over time Modifying Factors: Other treatment(s) tried include: local dressings and offloading Associated Signs and Symptoms: Patient reports having difficulty standing for long periods. HPI Description: 81 year old patient with past medical history of Alzheimer's  dementia, diabetes mellitus, hypertension and recent history of right hip arthroplasty in August 2016. Her past medical history is significant also for hypothyroidism, hypertension, urinary retention, dementia. 04/11/2015 -- she has had a lot of pain and left heel and there has been a lot of drainage today with purulent material. He has not been running fever. Electronic Signature(s) Signed: 04/11/2015 8:37:22 AM By: Evlyn Kanner MD, FACS Entered By: Evlyn Kanner on 04/11/2015 08:37:22 Delrosario, [name] ([id number]) -------------------------------------------------------------------------------- Physical Exam Details Patient Name: [name] Date of Service: 04/11/2015 8:00 AM Medical Record Patient Account Number: [id number] Number: Afful, RN, BSN, Treating RN: 10/01/1927 (81 y.o. [place] S[place] Date of Birth/Sex: Female) Other Clinician: Primary Care Physician: CAMPBELL, MARGARET Treating Denis Koppel Referring Physician: Loney Laurence Physician/Extender: Weeks in Treatment: 6 Constitutional . Pulse regular. Respirations normal and unlabored. Afebrile. . Eyes Nonicteric. Reactive to light. Ears, Nose, Mouth, and Throat Lips, teeth, and gums WNL.Marland Kitchen Moist mucosa without lesions . Neck supple and nontender. No palpable supraclavicular or cervical adenopathy. Normal sized without goiter. Respiratory WNL. No retractions.. Breath sounds WNL, No rubs, rales, rhonchi, or wheeze.. Cardiovascular Heart rhythm and rate regular, no murmur or gallop.. Pedal Pulses WNL. No clubbing, cyanosis or edema. Chest Breasts symmetical and no nipple discharge.. Breast tissue WNL, no masses, lumps, or tenderness.. Lymphatic No adneopathy. No adenopathy. No adenopathy. Musculoskeletal Adexa without tenderness or enlargement.. Digits and nails w/o clubbing, cyanosis, infection, petechiae, ischemia, or inflammatory conditions.. Integumentary (Hair, Skin) No suspicious lesions. No crepitus  or fluctuance. No peri-wound warmth or erythema. No masses.Marland Kitchen Psychiatric Judgement and insight Intact.. No evidence of depression, anxiety, or agitation.. Notes the posterior part of the left heel has excessive amount of purulent drainage and extreme tenderness to touch. Electronic Signature(s) Signed: 04/11/2015 8:37:50 AM By: Evlyn Kanner MD, FACS Entered By: Evlyn Kanner on 04/11/2015 08:37:50 [name] ([id number]) -------------------------------------------------------------------------------- Physician Orders Details Patient Name: [name] Date of Service: 04/11/2015 8:00 AM Medical Record Number: [id number] Patient Account Number: [id number] Date of Birth/Sex:  10-Feb-1928 (81 y.o. Female) Treating RN: Ashok CordiaPinkerton, Debi Primary Care Physician: Loney LaurenceAMPBELL, MARGARET Other Clinician: Referring Physician: Loney LaurenceAMPBELL, MARGARET Treating Physician/Extender: Rudene ReBritto, Kameron Blethen Weeks in Treatment: 6 Verbal / Phone Orders: Yes Clinician: Pinkerton, Debi Read Back and Verified: Yes Diagnosis Coding Wound Cleansing Wound #1 Left Calcaneous o Clean wound with Normal Saline. Anesthetic Wound #1 Left Calcaneous o Topical Lidocaine 4% cream applied to wound bed prior to debridement Primary Wound Dressing Wound #1 Left Calcaneous o Dry Gauze Secondary Dressing Wound #1 Left Calcaneous o Conform/Kerlix Additional Orders / Instructions Wound #1 Left Calcaneous o Other: - Go to the ER for worsening of wound for IV ABT and possible surgical debridement of wound. Electronic Signature(s) Signed: 04/11/2015 4:40:44 PM By: Evlyn KannerBritto, Eternity Dexter MD, FACS Signed: 04/15/2015 6:00:56 PM By: Alejandro MullingPinkerton, Debra Entered By: Alejandro MullingPinkerton, Debra on 04/11/2015 08:34:28 Rollison, [name] ([id number]) -------------------------------------------------------------------------------- Problem List Details Patient Name: Gloria FateMORDECAI, Gloria Date of Service: 04/11/2015 8:00 AM Medical Record Patient Account  Number: [id number] Number: Afful, RN, BSN, Treating RN: 10-Feb-1928 (81 y.o. [place] Date of Birth/Sex: Female) Other Clinician: Primary Care Physician: CAMPBELL, MARGARET Treating Graziella Connery Referring Physician: Loney LaurenceAMPBELL, MARGARET Physician/Extender: Weeks in Treatment: 6 Active Problems ICD-10 Encounter Code Description Active Date Diagnosis E11.621 Type 2 diabetes mellitus with foot ulcer 02/27/2015 Yes L89.620 Pressure ulcer of left heel, unstageable 02/27/2015 Yes L89.521 Pressure ulcer of left ankle, stage 1 02/27/2015 Yes G30.9 Alzheimer's disease, unspecified 02/27/2015 Yes L03.116 Cellulitis of left lower limb 04/11/2015 Yes Inactive Problems Resolved Problems Electronic Signature(s) Signed: 04/11/2015 8:36:44 AM By: Evlyn KannerBritto, Caryle Helgeson MD, FACS Entered By: Evlyn KannerBritto, Vasti Yagi on 04/11/2015 08:36:44 Abdalla, Arletta ([id number]) -------------------------------------------------------------------------------- Progress Note Details Patient Name: Gloria FateMORDECAI, Gloria Date of Service: 04/11/2015 8:00 AM Medical Record Patient Account Number: [id number] Number: Afful, RN, BSN, Treating RN: 10-Feb-1928 (81 y.o. [place] Sinkita Date of Birth/Sex: Female) Other Clinician: Primary Care Physician: CAMPBELL, MARGARET Treating Erik Nessel Referring Physician: Loney LaurenceAMPBELL, MARGARET Physician/Extender: Tania AdeWeeks in Treatment: 6 Subjective Chief Complaint Information obtained from Patient Patient is at the clinic for treatment of an open pressure ulcer. Recently noticed to have a pressure injury to the left heel for about 2 weeks now. History of Present Illness (HPI) The following HPI elements were documented for the patient's wound: Location: left heel and dorsum of the foot Quality: Patient reports No Pain. Severity: Patient states wound (s) are getting better. Duration: Patient has had the wound for < 2 weeks prior to presenting for treatment Timing: Pain in wound is Intermittent  (comes and goes Context: The wound appeared gradually over time Modifying Factors: Other treatment(s) tried include: local dressings and offloading Associated Signs and Symptoms: Patient reports having difficulty standing for long periods. 81 year old patient with past medical history of Alzheimer's dementia, diabetes mellitus, hypertension and recent history of right hip arthroplasty in August 2016. Her past medical history is significant also for hypothyroidism, hypertension, urinary retention, dementia. 04/11/2015 -- she has had a lot of pain and left heel and there has been a lot of drainage today with purulent material. He has not been running fever. Objective Constitutional Pulse regular. Respirations normal and unlabored. Afebrile. Vitals Time Taken: 8:15 AM, Height: 64 in, Temperature: 98.2 F, Pulse: 89 bpm, Respiratory Rate: 18 breaths/min, Blood Pressure: 133/64 mmHg. [place], Laronda ([id number]) Eyes Nonicteric. Reactive to light. Ears, Nose, Mouth, and Throat Lips, teeth, and gums WNL.Marland Kitchen. Moist mucosa without lesions . Neck supple and nontender. No palpable supraclavicular or cervical adenopathy. Normal sized without goiter. Respiratory WNL. No retractions.. Breath sounds WNL, No rubs, rales, rhonchi,  or wheeze.. Cardiovascular Heart rhythm and rate regular, no murmur or gallop.. Pedal Pulses WNL. No clubbing, cyanosis or edema. Chest Breasts symmetical and no nipple discharge.. Breast tissue WNL, no masses, lumps, or tenderness.. Lymphatic No adneopathy. No adenopathy. No adenopathy. Musculoskeletal Adexa without tenderness or enlargement.. Digits and nails w/o clubbing, cyanosis, infection, petechiae, ischemia, or inflammatory conditions.Marland Kitchen Psychiatric Judgement and insight Intact.. No evidence of depression, anxiety, or agitation.. General Notes: the posterior part of the left heel has excessive amount of purulent drainage and extreme tenderness to  touch. Integumentary (Hair, Skin) No suspicious lesions. No crepitus or fluctuance. No peri-wound warmth or erythema. No masses.. Wound #1 status is Open. Original cause of wound was Pressure Injury. The wound is located on the Left Calcaneous. The wound measures 4.5cm length x 4.6cm width x 0.1cm depth; 16.258cm^2 area and 1.626cm^3 volume. The wound is limited to skin breakdown. There is no tunneling or undermining noted. There is a small amount of serosanguineous drainage noted. The wound margin is indistinct and nonvisible. There is no granulation within the wound bed. There is a large (67-100%) amount of necrotic tissue within the wound bed including Eschar and Adherent Slough. The periwound skin appearance exhibited: Localized Edema. The periwound skin appearance did not exhibit: Callus, Crepitus, Excoriation, Fluctuance, Friable, Induration, Rash, Scarring, Dry/Scaly, Maceration, Moist, Atrophie Blanche, Cyanosis, Ecchymosis, Hemosiderin Staining, Mottled, Pallor, Rubor, Erythema. Periwound temperature was noted as No Abnormality. The periwound has tenderness on palpation. Wound #2 status is Healed - Epithelialized. Original cause of wound was Pressure Injury. The wound is located on the Left,Dorsal Foot. The wound measures 0cm length x 0cm width x 0cm depth; 0cm^2 area and 0cm^3 volume. [name], [name] ([id number]) Assessment Active Problems ICD-10 E11.621 - Type 2 diabetes mellitus with foot ulcer L89.620 - Pressure ulcer of left heel, unstageable L89.521 - Pressure ulcer of left ankle, stage 1 G30.9 - Alzheimer's disease, unspecified L03.116 - Cellulitis of left lower limb This elderly lady who has initially had a unstageable heel ulcer has now got a fairly tender area with purulent discharge and this is now wet gangrene, in spite of being on oral antibiotics for the last 10 days.. Her ABIs were not compressible on initial evaluation. At this stage she will need to go to the ER  for inpatient care, workup and debridement under general anesthesia, and I have explained this to the caregiver was at the bedside. I would be happy to speak to the ER physician if any question need to be answered. Plan Wound Cleansing: Wound #1 Left Calcaneous: Clean wound with Normal Saline. Anesthetic: Wound #1 Left Calcaneous: Topical Lidocaine 4% cream applied to wound bed prior to debridement Primary Wound Dressing: Wound #1 Left Calcaneous: Dry Gauze Secondary Dressing: Wound #1 Left Calcaneous: Conform/Kerlix Additional Orders / Instructions: Wound #1 Left Calcaneous: Other: - Go to the ER for worsening of wound for IV ABT and possible surgical debridement of wound. [name], [name] ([id number]) This elderly lady who has initially had a unstageable heel ulcer has now got a fairly tender area with purulent discharge and this is now wet gangrene, in spite of being on oral antibiotics for the last 10 days.. Her ABIs were not compressible on initial evaluation. At this stage she will need to go to the ER for inpatient care, workup and debridement under general anesthesia, and I have explained this to the caregiver was at the bedside. I would be happy to speak to the ER physician if any question need to be answered.  Electronic Signature(s) Signed: 04/11/2015 8:40:40 AM By: Evlyn Kanner MD, FACS Entered By: Evlyn Kanner on 04/11/2015 08:40:40 [name] ([id number]) -------------------------------------------------------------------------------- SuperBill Details Patient Name: [name] Date of Service: 04/11/2015 Medical Record Number: [id number] Patient Account Number: [id number] Date of Birth/Sex: 08/09/1927 (81 y.o. Female) Treating RN: Ashok Cordia, Debi Primary Care Physician: Loney Laurence Other Clinician: Referring Physician: Loney Laurence Treating Physician/Extender: Rudene Re in Treatment: 6 Diagnosis Coding ICD-10 Codes Code  Description E11.621 Type 2 diabetes mellitus with foot ulcer L89.620 Pressure ulcer of left heel, unstageable L89.521 Pressure ulcer of left ankle, stage 1 G30.9 Alzheimer's disease, unspecified Facility Procedures CPT4 Code: [id number] Description: 30865 - WOUND CARE VISIT-LEV 2 EST PT Modifier: Quantity: 1 Physician Procedures CPT4 Code: [id number] Description: 99213 - WC PHYS LEVEL 3 - EST PT ICD-10 Description Diagnosis E11.621 Type 2 diabetes mellitus with foot ulcer L89.620 Pressure ulcer of left heel, unstageable L89.521 Pressure ulcer of left ankle, stage 1 G30.9 Alzheimer's disease,  unspecified Modifier: Quantity: 1 Electronic Signature(s) Signed: 04/11/2015 8:41:11 AM By: Evlyn Kanner MD, FACS Entered By: Evlyn Kanner on 04/11/2015 08:41:11

## 2015-04-17 LAB — WOUND CULTURE

## 2015-04-17 LAB — ANAEROBIC CULTURE

## 2015-04-18 ENCOUNTER — Encounter: Payer: Medicare Other | Admitting: Surgery

## 2015-04-18 DIAGNOSIS — L89521 Pressure ulcer of left ankle, stage 1: Secondary | ICD-10-CM | POA: Diagnosis not present

## 2015-04-18 DIAGNOSIS — E11621 Type 2 diabetes mellitus with foot ulcer: Secondary | ICD-10-CM | POA: Diagnosis present

## 2015-04-18 DIAGNOSIS — I10 Essential (primary) hypertension: Secondary | ICD-10-CM | POA: Diagnosis not present

## 2015-04-18 DIAGNOSIS — G309 Alzheimer's disease, unspecified: Secondary | ICD-10-CM | POA: Diagnosis not present

## 2015-04-18 DIAGNOSIS — L03116 Cellulitis of left lower limb: Secondary | ICD-10-CM | POA: Diagnosis not present

## 2015-04-18 DIAGNOSIS — L89620 Pressure ulcer of left heel, unstageable: Secondary | ICD-10-CM | POA: Diagnosis not present

## 2015-04-19 NOTE — Progress Notes (Addendum)
CERYS, WINGET ([id number]) Visit Report for 04/18/2015 Chief Complaint Document Details Patient Name: [name], [name] Date of Service: 04/18/2015 8:00 AM Medical Record Patient Account Number: [id number] Number: Afful, RN, BSN, Treating RN: 08-24-1927 (81 y.o. [place] Sink Date of Birth/Sex: Female) Other Clinician: Primary Care Physician: CAMPBELL, MARGARET Treating Khyli Swaim Referring Physician: Loney Laurence Physician/Extender: Weeks in Treatment: 7 Information Obtained from: Patient Chief Complaint Patient is at the clinic for treatment of an open pressure ulcer. Recently noticed to have a pressure injury to the left heel for about 2 weeks now. Electronic Signature(s) Signed: 04/18/2015 8:29:36 AM By: Evlyn Kanner MD, FACS Entered By: Evlyn Kanner on 04/18/2015 08:29:36 Ruperto, [name] ([id number]) -------------------------------------------------------------------------------- HPI Details Patient Name: [name] Date of Service: 04/18/2015 8:00 AM Medical Record Patient Account Number: [id number] Number: Afful, RN, BSN, Treating RN: 1927-07-04 (81 y.o. [place] Sink Date of Birth/Sex: Female) Other Clinician: Primary Care Physician: CAMPBELL, MARGARET Treating Chaselynn Kepple Referring Physician: Loney Laurence Physician/Extender: Weeks in Treatment: 7 History of Present Illness Location: left heel and dorsum of the foot Quality: Patient reports No Pain. Severity: Patient states wound (s) are getting better. Duration: Patient has had the wound for < 2 weeks prior to presenting for treatment Timing: Pain in wound is Intermittent (comes and goes Context: The wound appeared gradually over time Modifying Factors: Other treatment(s) tried include: local dressings and offloading Associated Signs and Symptoms: Patient reports having difficulty standing for long periods. HPI Description: 81 year old patient with past medical history of Alzheimer's  dementia, diabetes mellitus, hypertension and recent history of right hip arthroplasty in August 2016. Her past medical history is significant also for hypothyroidism, hypertension, urinary retention, dementia. 04/11/2015 -- she has had a lot of pain and left heel and there has been a lot of drainage today with purulent material. He has not been running fever. 04/18/2015 -- last week she was here with a florid infection of her left heel and I had sent her to the hospital for admission. She was admitted between 12/ 2 and 12/ 5 and she underwent a debridement of the left heel abscess with placement of antibiotic beads with vancomycin, by Dr. Linus Galas. Her cultures grew gram- negative bacteria, was given vancomycin and Unasyn IV in hospital and she was continued on oral antibiotics and was given documented for 10 days. at the time of discharge Dr. Alberteen Spindle wanted the dressing to be intact for about a week before it would need changing. Electronic Signature(s) Signed: 04/18/2015 8:30:22 AM By: Evlyn Kanner MD, FACS Previous Signature: 04/18/2015 7:59:23 AM Version By: Evlyn Kanner MD, FACS Entered By: Evlyn Kanner on 04/18/2015 08:30:21 [name] ([id number]) -------------------------------------------------------------------------------- Physical Exam Details Patient Name: [name] Date of Service: 04/18/2015 8:00 AM Medical Record Patient Account Number: [id number] Number: Afful, RN, BSN, Treating RN: [birth year] (81 y.o. [place] Sink Date of Birth/Sex: Female) Other Clinician: Primary Care Physician: CAMPBELL, MARGARET Treating Jalyric Kaestner Referring Physician: Loney Laurence Physician/Extender: Weeks in Treatment: 7 Constitutional . Pulse regular. Respirations normal and unlabored. Afebrile. . Eyes Nonicteric. Reactive to light. Ears, Nose, Mouth, and Throat Lips, teeth, and gums WNL.Marland Kitchen Moist mucosa without lesions . Neck supple and nontender. No palpable  supraclavicular or cervical adenopathy. Normal sized without goiter. Respiratory WNL. No retractions.. Cardiovascular Pedal Pulses WNL. ABI was checked today and found to be 0.63. No clubbing, cyanosis or edema. Lymphatic No adneopathy. No adenopathy. No adenopathy. Musculoskeletal Adexa without tenderness or enlargement.. Digits and nails w/o clubbing, cyanosis, infection, petechiae, ischemia, or inflammatory conditions.. Integumentary (Hair,  Skin) No suspicious lesions. No crepitus or fluctuance. No peri-wound warmth or erythema. No masses.Marland Kitchen. Psychiatric Judgement and insight Intact.. No evidence of depression, anxiety, or agitation.. Notes the left heel which was debrided in the OR has antibiotic beads in place and there is a dressing sewn intact by the podiatrist. The heel looks very good there is no cellulitis and no drainage. Electronic Signature(s) Signed: 04/18/2015 8:58:02 AM By: Evlyn KannerBritto, Florencio Hollibaugh MD, FACS Previous Signature: 04/18/2015 8:31:47 AM Version By: Evlyn KannerBritto, Kharizma Lesnick MD, FACS Entered By: Evlyn KannerBritto, Lydie Stammen on 04/18/2015 08:58:02 Gloria FateMORDECAI, Reannon ([id number]) -------------------------------------------------------------------------------- Physician Orders Details Patient Name: Gloria FateMORDECAI, Melisssa Date of Service: 04/18/2015 8:00 AM Medical Record Patient Account Number: [id number] Number: Afful, RN, BSN, Treating RN: 1927-05-23 (81 y.o. [place] Sinkita Date of Birth/Sex: Female) Other Clinician: Primary Care Physician: CAMPBELL, MARGARET Treating Dantre Yearwood Referring Physician: Loney LaurenceAMPBELL, MARGARET Physician/Extender: Tania AdeWeeks in Treatment: 7 Verbal / Phone Orders: Yes Clinician: Afful, RN, BSN, Rita Read Back and Verified: Yes Diagnosis Coding Primary Wound Dressing Wound #1 Left Calcaneous o Drawtex Secondary Dressing Wound #1 Left Calcaneous o Gauze and Kerlix/Conform Dressing Change Frequency Wound #1 Left Calcaneous o Change dressing every week - Change only  outside dressing as needed fro excess drainage. Do not remove adaptic. Patient has antibiotic beads implanted. Follow-up Appointments Wound #1 Left Calcaneous o Return Appointment in 1 week. Off-Loading Wound #1 Left Calcaneous o Other: - SAGE BOOTS Electronic Signature(s) Signed: 04/18/2015 8:49:08 AM By: Elpidio EricAfful, Rita BSN, RN Signed: 04/18/2015 4:13:31 PM By: Evlyn KannerBritto, Amarion Portell MD, FACS Entered By: Elpidio EricAfful, Rita on 04/18/2015 08:49:08 Rusnak, [name] ([id number]) -------------------------------------------------------------------------------- Problem List Details Patient Name: Gloria FateMORDECAI, Anavi Date of Service: 04/18/2015 8:00 AM Medical Record Patient Account Number: [id number] Number: Afful, RN, BSN, Treating RN: 1927-05-23 (81 y.o. [place] Sinkita Date of Birth/Sex: Female) Other Clinician: Primary Care Physician: CAMPBELL, MARGARET Treating Laythan Hayter Referring Physician: Loney LaurenceAMPBELL, MARGARET Physician/Extender: Weeks in Treatment: 7 Active Problems ICD-10 Encounter Code Description Active Date Diagnosis E11.621 Type 2 diabetes mellitus with foot ulcer 02/27/2015 Yes L89.620 Pressure ulcer of left heel, unstageable 02/27/2015 Yes L89.521 Pressure ulcer of left ankle, stage 1 02/27/2015 Yes G30.9 Alzheimer's disease, unspecified 02/27/2015 Yes L03.116 Cellulitis of left lower limb 04/11/2015 Yes Inactive Problems Resolved Problems Electronic Signature(s) Signed: 04/18/2015 8:29:27 AM By: Evlyn KannerBritto, Anayelli Lai MD, FACS Entered By: Evlyn KannerBritto, Saiquan Hands on 04/18/2015 08:29:27 Gloria FateMORDECAI, Aadhira ([id number]) -------------------------------------------------------------------------------- Progress Note Details Patient Name: Gloria FateMORDECAI, Samanthamarie Date of Service: 04/18/2015 8:00 AM Medical Record Patient Account Number: [id number] Number: Afful, RN, BSN, Treating RN: 1927-05-23 (81 y.o. [place] Sinkita Date of Birth/Sex: Female) Other Clinician: Primary Care Physician: CAMPBELL, MARGARET  Treating Jesus Nevills Referring Physician: Loney LaurenceAMPBELL, MARGARET Physician/Extender: Tania AdeWeeks in Treatment: 7 Subjective Chief Complaint Information obtained from Patient Patient is at the clinic for treatment of an open pressure ulcer. Recently noticed to have a pressure injury to the left heel for about 2 weeks now. History of Present Illness (HPI) The following HPI elements were documented for the patient's wound: Location: left heel and dorsum of the foot Quality: Patient reports No Pain. Severity: Patient states wound (s) are getting better. Duration: Patient has had the wound for < 2 weeks prior to presenting for treatment Timing: Pain in wound is Intermittent (comes and goes Context: The wound appeared gradually over time Modifying Factors: Other treatment(s) tried include: local dressings and offloading Associated Signs and Symptoms: Patient reports having difficulty standing for long periods. 81 year old patient with past medical history of Alzheimer's dementia, diabetes mellitus, hypertension and recent history of right hip arthroplasty in  August 2016. Her past medical history is significant also for hypothyroidism, hypertension, urinary retention, dementia. 04/11/2015 -- she has had a lot of pain and left heel and there has been a lot of drainage today with purulent material. He has not been running fever. 04/18/2015 -- last week she was here with a florid infection of her left heel and I had sent her to the hospital for admission. She was admitted between 12/ 2 and 12/ 5 and she underwent a debridement of the left heel abscess with placement of antibiotic beads with vancomycin, by Dr. Linus Galas. Her cultures grew gram- negative bacteria, was given vancomycin and Unasyn IV in hospital and she was continued on oral antibiotics and was given documented for 10 days. at the time of discharge Dr. Alberteen Spindle wanted the dressing to be intact for about a week before it would need  changing. Objective Shaddix, Jeanette ([id number]) Constitutional Pulse regular. Respirations normal and unlabored. Afebrile. Vitals Time Taken: 8:19 AM, Height: 64 in, Temperature: 98.3 F, Pulse: 79 bpm, Respiratory Rate: 18 breaths/min, Blood Pressure: 194/99 mmHg. Eyes Nonicteric. Reactive to light. Ears, Nose, Mouth, and Throat Lips, teeth, and gums WNL.Marland Kitchen Moist mucosa without lesions . Neck supple and nontender. No palpable supraclavicular or cervical adenopathy. Normal sized without goiter. Respiratory WNL. No retractions.. Cardiovascular Pedal Pulses WNL. ABI was checked today and found to be 0.63. No clubbing, cyanosis or edema. Lymphatic No adneopathy. No adenopathy. No adenopathy. Musculoskeletal Adexa without tenderness or enlargement.. Digits and nails w/o clubbing, cyanosis, infection, petechiae, ischemia, or inflammatory conditions.Marland Kitchen Psychiatric Judgement and insight Intact.. No evidence of depression, anxiety, or agitation.. General Notes: the left heel which was debrided in the OR has antibiotic beads in place and there is a dressing sewn intact by the podiatrist. The heel looks very good there is no cellulitis and no drainage. Integumentary (Hair, Skin) No suspicious lesions. No crepitus or fluctuance. No peri-wound warmth or erythema. No masses.. Wound #1 status is Open. Original cause of wound was Pressure Injury. The wound is located on the Left Calcaneous. The wound measures 3.5cm length x 3.5cm width x 0.1cm depth; 9.621cm^2 area and 0.962cm^3 volume. The wound is limited to skin breakdown. There is no tunneling or undermining noted. There is a small amount of serosanguineous drainage noted. The wound margin is indistinct and nonvisible. There is no granulation within the wound bed. There is a large (67-100%) amount of necrotic tissue within the wound bed including Eschar and Adherent Slough. The periwound skin appearance exhibited: Moist. The periwound skin  appearance did not exhibit: Callus, Crepitus, Excoriation, Fluctuance, Friable, Induration, Localized Edema, Rash, Scarring, Dry/Scaly, Maceration, Atrophie Blanche, Cyanosis, Ecchymosis, Hemosiderin Staining, Mottled, Pallor, Rubor, Erythema. Periwound temperature was noted as No Abnormality. SHUNDRA, WIRSING ([id number]) Assessment Active Problems ICD-10 E11.621 - Type 2 diabetes mellitus with foot ulcer L89.620 - Pressure ulcer of left heel, unstageable L89.521 - Pressure ulcer of left ankle, stage 1 G30.9 - Alzheimer's disease, unspecified L03.116 - Cellulitis of left lower limb The patient recently had a debridement of her left heel which was badly infected, has had IV antibiotics and also has antibiotic beads in place. The ABI today is 0.63 on the left leg and I have recommended arterial duplex studies. For the dressing changes I have recommended drawtex and a light Kerlix dressing over the Mepitel with antibiotic beads. She will be seeing Dr. Alberteen Spindle for follow-up in the postop period. Depending on her vascular workup and the progression of her heel wound, we will have  to consider further therapies including hyperbaric oxygen therapy for a Wagner grade 3 diabetic foot ulcer. No caregiver was here with her today and we will need to talk to the family regarding how we would like to proceed.we will do our best to bring a family member in either physically on the phone to discuss her care. Plan Primary Wound Dressing: Wound #1 Left Calcaneous: Drawtex Secondary Dressing: Wound #1 Left Calcaneous: Gauze and Kerlix/Conform Dressing Change Frequency: Wound #1 Left Calcaneous: Change dressing every week - Change only outside dressing as needed fro excess drainage. Do not remove adaptic. Patient has antibiotic beads implanted. Follow-up Appointments: Wound #1 Left Calcaneous: Return Appointment in 1 week. Off-Loading: Hoglund, Arlissa ([id number]) Wound #1 Left Calcaneous: Other:  - SAGE BOOTS The patient recently had a debridement of her left heel which was badly infected, has had IV antibiotics and also has antibiotic beads in place. The ABI today is 0.63 on the left leg and I have recommended arterial duplex studies. For the dressing changes I have recommended drawtex and a light Kerlix dressing over the Mepitel with antibiotic beads. She will be seeing Dr. Alberteen Spindle for follow-up in the postop period. Depending on her vascular workup and the progression of her heel wound, we will have to consider further therapies including hyperbaric oxygen therapy for a Wagner grade 3 diabetic foot ulcer. No caregiver was here with her today and we will need to talk to the family regarding how we would like to proceed.we will do our best to bring a family member in either physically on the phone to discuss her care. Electronic Signature(s) Signed: 04/18/2015 9:03:48 AM By: Evlyn Kanner MD, FACS Entered By: Evlyn Kanner on 04/18/2015 09:03:47 Kartes, [name] ([id number]) -------------------------------------------------------------------------------- SuperBill Details Patient Name: [name] Date of Service: 04/18/2015 Medical Record Patient Account Number: [id number] Number: Afful, RN, BSN, Treating RN: June 18, 1927 (81 y.o. [place] Sink Date of Birth/Sex: Female) Other Clinician: Primary Care Physician: CAMPBELL, MARGARET Treating Dayshia Ballinas Referring Physician: Loney Laurence Physician/Extender: Weeks in Treatment: 7 Diagnosis Coding ICD-10 Codes Code Description E11.621 Type 2 diabetes mellitus with foot ulcer L89.620 Pressure ulcer of left heel, unstageable L89.521 Pressure ulcer of left ankle, stage 1 G30.9 Alzheimer's disease, unspecified L03.116 Cellulitis of left lower limb Facility Procedures CPT4 Code: [id number] Description: 30865 - WOUND CARE VISIT-LEV 2 EST PT Modifier: Quantity: 1 Physician Procedures CPT4 Code: 7846962 Description:  99213 - WC PHYS LEVEL 3 - EST PT ICD-10 Description Diagnosis E11.621 Type 2 diabetes mellitus with foot ulcer L89.620 Pressure ulcer of left heel, unstageable L89.521 Pressure ulcer of left ankle, stage 1 G30.9 Alzheimer's disease,  unspecified Modifier: Quantity: 1 Electronic Signature(s) Signed: 04/18/2015 3:36:04 PM By: Elpidio Eric BSN, RN Signed: 04/18/2015 4:13:31 PM By: Evlyn Kanner MD, FACS Previous Signature: 04/18/2015 9:04:04 AM Version By: Evlyn Kanner MD, FACS Entered By: Elpidio Eric on 04/18/2015 15:31:09

## 2015-04-19 NOTE — Progress Notes (Signed)
[name], [name] ([id number]) Visit Report for 04/18/2015 Arrival Information Details Patient Name: [name], [name] Date of Service: 04/18/2015 8:00 AM Medical Record Number: [id number] Patient Account Number: [id number] Date of Birth/Sex: [birth year] (81 y.o. Female) Treating [name]: [name], [name] Primary Care Physician: [name] Other Clinician: Referring Physician: Loney LaurenceAMPBELL, [name] Treating Physician/Extender: [name] Weeks in Treatment: 7 Visit Information History Since Last Visit Added or deleted any medications: No Patient Arrived: Wheel Chair Any new allergies or adverse reactions: No Arrival Time: 08:18 Had a fall or experienced change in No Accompanied By: self activities of daily living that may affect Transfer Assistance: Manual risk of falls: Patient Identification Verified: Yes Signs or symptoms of abuse/neglect since last No Secondary Verification Process Yes visito Completed: Has Dressing in Place as Prescribed: Yes Patient Requires Transmission- No Pain Present Now: No Based Precautions: Patient Has Alerts: Yes Patient Alerts: Patient on Blood Thinner ABI: Non Compressible Electronic Signature(s) Signed: 04/18/2015 3:36:04 PM By: [name] BSN, [name] Entered By: [name] on 04/18/2015 08:19:18 [name] ([id number]) -------------------------------------------------------------------------------- Clinic Level of Care Assessment Details Patient Name: [name], [name] Date of Service: 04/18/2015 8:00 AM Medical Record Number: [id number] Patient Account Number: [id number] Date of Birth/Sex: [birth year] (81 y.o. Female) Treating [name]: [name],  [name] Primary Care Physician: [name] Other Clinician: Referring Physician: Loney LaurenceAMPBELL, [name] Treating Physician/Extender: [name] Weeks in Treatment: 7 Clinic Level of Care Assessment Items TOOL 4 Quantity Score []  - Use when only an EandM is performed on FOLLOW-UP  visit 0 ASSESSMENTS - Nursing Assessment / Reassessment X - Reassessment of Co-morbidities (includes updates in patient status) 1 10 X - Reassessment of Adherence to Treatment Plan 1 5 ASSESSMENTS - Wound and Skin Assessment / Reassessment X - Simple Wound Assessment / Reassessment - one wound 1 5 []  - Complex Wound Assessment / Reassessment - multiple wounds 0 []  - Dermatologic / Skin Assessment (not related to wound area) 0 ASSESSMENTS - Focused Assessment []  - Circumferential Edema Measurements - multi extremities 0 []  - Nutritional Assessment / Counseling / Intervention 0 X - Lower Extremity Assessment (monofilament, tuning fork, pulses) 1 5 []  - Peripheral Arterial Disease Assessment (using hand held doppler) 0 ASSESSMENTS - Ostomy and/or Continence Assessment and Care []  - Incontinence Assessment and Management 0 []  - Ostomy Care Assessment and Management (repouching, etc.) 0 PROCESS - Coordination of Care X - Simple Patient / Family Education for ongoing care 1 15 []  - Complex (extensive) Patient / Family Education for ongoing care 0 []  - Staff obtains ChiropractorConsents, Records, Test Results / Process Orders 0 []  - Staff telephones HHA, Nursing Homes / Clarify orders / etc 0 []  - Routine Transfer to another Facility (non-emergent condition) 0 [name] ([id number]) []  - Routine Hospital Admission (non-emergent condition) 0 []  - New Admissions / Manufacturing engineernsurance Authorizations / Ordering NPWT, Apligraf, etc. 0 []  - Emergency Hospital Admission (emergent condition) 0 []  - Simple Discharge Coordination 0 []  - Complex (extensive) Discharge Coordination 0 PROCESS - Special Needs []  - Pediatric / Minor Patient Management 0 []  - Isolation Patient Management 0 []  - Hearing / Language / Visual special needs 0 []  - Assessment of Community assistance (transportation, D/C planning, etc.) 0 []  - Additional assistance / Altered mentation 0 []  - Support Surface(s) Assessment (bed, cushion, seat,  etc.) 0 INTERVENTIONS - Wound Cleansing / Measurement []  - Simple Wound Cleansing - one wound 0 []  - Complex Wound Cleansing - multiple wounds 0 X - Wound Imaging (photographs - any number of  wounds) 1 5 []  - Wound Tracing (instead of photographs) 0 []  - Simple Wound Measurement - one wound 0 []  - Complex Wound Measurement - multiple wounds 0 INTERVENTIONS - Wound Dressings X - Small Wound Dressing one or multiple wounds 1 10 []  - Medium Wound Dressing one or multiple wounds 0 []  - Large Wound Dressing one or multiple wounds 0 []  - Application of Medications - topical 0 []  - Application of Medications - injection 0 INTERVENTIONS - Miscellaneous []  - External ear exam 0 [name] ([id number]) []  - Specimen Collection (cultures, biopsies, blood, body fluids, etc.) 0 []  - Specimen(s) / Culture(s) sent or taken to Lab for analysis 0 []  - Patient Transfer (multiple staff / Michiel Sites Lift / Similar devices) 0 []  - Simple Staple / Suture removal (25 or less) 0 []  - Complex Staple / Suture removal (26 or more) 0 []  - Hypo / Hyperglycemic Management (close monitor of Blood Glucose) 0 []  - Ankle / Brachial Index (ABI) - do not check if billed separately 0 X - Vital Signs 1 5 Has the patient been seen at the hospital within the last three years: Yes Total Score: 60 Level Of Care: New/Established - Level 2 Electronic Signature(s) Signed: 04/18/2015 3:36:04 PM By: [name] BSN, [name] Entered By: [name] on 04/18/2015 15:30:53 [name] ([id number]) -------------------------------------------------------------------------------- Encounter Discharge Information Details Patient Name: [name] Date of Service: 04/18/2015 8:00 AM Medical Record Number: [id number] Patient Account Number: [id number] Date of Birth/Sex: January 19, 1928 (81 y.o. Female) Treating [name]: [name], [place] Sink Primary Care Physician: Gloria Laurence Other Clinician: Referring Physician: Loney Laurence Treating Physician/Extender: Gloria Re in Treatment: 7 Encounter Discharge Information Items Schedule Follow-up Appointment: No Medication Reconciliation completed No and provided to Patient/Care Tavian Callander: Provided on Clinical Summary of Care: 04/18/2015 Form Type Recipient Paper Patient BM Electronic Signature(s) Signed: 04/18/2015 8:44:33 AM By: Gwenlyn Perking Entered By: Gwenlyn Perking on 04/18/2015 08:44:33 Pohle, Tylisha ([id number]) -------------------------------------------------------------------------------- Lower Extremity Assessment Details Patient Name: [name] Date of Service: 04/18/2015 8:00 AM Medical Record Number: [id number] Patient Account Number: [id number] Date of Birth/Sex: Feb 08, 1928 (81 y.o. Female) Treating [name]: [name], [place] Sink Primary Care Physician: Gloria Laurence Other Clinician: Referring Physician: Loney Laurence Treating Physician/Extender: Gloria Re in Treatment: 7 Vascular Assessment Pulses: Posterior Tibial Dorsalis Pedis Palpable: [Left:No] Doppler: [Left:Multiphasic] Extremity colors, hair growth, and conditions: Extremity Color: [Left:Normal] Hair Growth on Extremity: [Left:No] Temperature of Extremity: [Left:Warm] Capillary Refill: [Left:< 3 seconds] Blood Pressure: Brachial: [Left:190] Dorsalis Pedis: 120 [Left:Dorsalis Pedis:] Ankle: Posterior Tibial: [Left:Posterior Tibial: 0.63] Toe Nail Assessment Left: Right: Thick: Yes Discolored: Yes Deformed: No Improper Length and Hygiene: No Electronic Signature(s) Signed: 04/18/2015 3:36:04 PM By: [name] BSN, [name] Entered By: [name] on 04/18/2015 08:39:16 Stream, [name] ([id number]) -------------------------------------------------------------------------------- Multi Wound Chart Details Patient Name: [name] Date of Service: 04/18/2015 8:00 AM Medical Record Number: [id number] Patient Account Number:  [id number] Date of Birth/Sex: 1928-03-30 (81 y.o. Female) Treating [name]: Clover Mealy, [name], [name], [place] Sink Primary Care Physician: Gloria Laurence Other Clinician: Referring Physician: Loney Laurence Treating Physician/Extender: Gloria Re in Treatment: 7 Vital Signs Height(in): 64 Pulse(bpm): 79 Weight(lbs): Blood Pressure 148/72 (mmHg): Body Mass Index(BMI): Temperature(F): 98.3 Respiratory Rate 18 (breaths/min): Photos: [1:No Photos] [N/A:N/A] Wound Location: [1:Left Calcaneous] [N/A:N/A] Wounding Event: [1:Pressure Injury] [N/A:N/A] Primary Etiology: [1:Pressure Ulcer] [N/A:N/A] Comorbid History: [1:Anemia, Hypertension, Type II Diabetes, History of pressure wounds, Osteoarthritis, Dementia] [N/A:N/A] Date Acquired: [1:01/30/2015] [N/A:N/A] Weeks of Treatment: [1:7] [N/A:N/A] Wound Status: [1:Open] [N/A:N/A] Measurements L x  W x D 3.5x3.5x0.1 [N/A:N/A] (cm) Area (cm) : [1:9.621] [N/A:N/A] Volume (cm) : [1:0.962] [N/A:N/A] % Reduction in Area: [1:0.00%] [N/A:N/A] % Reduction in Volume: 0.00% [N/A:N/A] Classification: [1:Unstageable/Unclassified] [N/A:N/A] HBO Classification: [1:Grade 1] [N/A:N/A] Exudate Amount: [1:Small] [N/A:N/A] Exudate Type: [1:Serosanguineous] [N/A:N/A] Exudate Color: [1:red, brown] [N/A:N/A] Wound Margin: [1:Indistinct, nonvisible] [N/A:N/A] Granulation Amount: [1:None Present (0%)] [N/A:N/A] Necrotic Amount: [1:Large (67-100%)] [N/A:N/A] Necrotic Tissue: [1:Eschar, Adherent Slough] [N/A:N/A] Exposed Structures: [1:Fascia: No Fat: No Tendon: No Muscle: No] [N/A:N/A] Joint: No Bone: No Limited to Skin Breakdown Epithelialization: None N/A N/A Periwound Skin Texture: Edema: No N/A N/A Excoriation: No Induration: No Callus: No Crepitus: No Fluctuance: No Friable: No Rash: No Scarring: No Periwound Skin Moist: Yes N/A N/A Moisture: Maceration: No Dry/Scaly: No Periwound Skin Color: Atrophie Blanche: No N/A N/A Cyanosis:  No Ecchymosis: No Erythema: No Hemosiderin Staining: No Mottled: No Pallor: No Rubor: No Temperature: No Abnormality N/A N/A Tenderness on No N/A N/A Palpation: Wound Preparation: Ulcer Cleansing: N/A N/A Rinsed/Irrigated with Saline Topical Anesthetic Applied: None Treatment Notes Electronic Signature(s) Signed: 04/18/2015 3:36:04 PM By: [name] BSN, [name] Entered By: [name] on 04/18/2015 08:27:57 [name] ([id number]) -------------------------------------------------------------------------------- Multi-Disciplinary Care Plan Details Patient Name: [name] Date of Service: 04/18/2015 8:00 AM Medical Record Number: [id number] Patient Account Number: [id number] Date of Birth/Sex: 06-12-1927 (81 y.o. Female) Treating [name]: [name], [name] Primary Care Physician: Gloria Laurence Other Clinician: Referring Physician: Loney Laurence Treating Physician/Extender: Gloria Re in Treatment: 7 Active Inactive Orientation to the Wound Care Program Nursing Diagnoses: Knowledge deficit related to the wound healing center program Goals: Patient/caregiver will verbalize understanding of the Wound Healing Center Program Date Initiated: 02/27/2015 Goal Status: Active Interventions: Provide education on orientation to the wound center Notes: Pressure Nursing Diagnoses: Knowledge deficit related to management of pressures ulcers Potential for impaired tissue integrity related to pressure, friction, moisture, and shear Goals: Patient will remain free from development of additional pressure ulcers Date Initiated: 02/27/2015 Goal Status: Active Patient will remain free of pressure ulcers Date Initiated: 02/27/2015 Goal Status: Active Patient/caregiver will verbalize risk factors for pressure ulcer development Date Initiated: 02/27/2015 Goal Status: Active Patient/caregiver will verbalize understanding of pressure ulcer management Date  Initiated: 02/27/2015 Goal Status: Active Interventions: Assess: immobility, friction, shearing, incontinence upon admission and as needed Vitiello, Rooney ([id number]) Assess offloading mechanisms upon admission and as needed Assess potential for pressure ulcer upon admission and as needed Provide education on pressure ulcers Treatment Activities: Patient referred for home evaluation of offloading devices/mattresses : 04/18/2015 Patient referred for pressure reduction/relief devices : 04/18/2015 Notes: Wound/Skin Impairment Nursing Diagnoses: Impaired tissue integrity Knowledge deficit related to ulceration/compromised skin integrity Goals: Patient/caregiver will verbalize understanding of skin care regimen Date Initiated: 02/27/2015 Goal Status: Active Ulcer/skin breakdown will have a volume reduction of 30% by week 4 Date Initiated: 02/27/2015 Goal Status: Active Ulcer/skin breakdown will have a volume reduction of 50% by week 8 Date Initiated: 02/27/2015 Goal Status: Active Ulcer/skin breakdown will have a volume reduction of 80% by week 12 Date Initiated: 02/27/2015 Goal Status: Active Ulcer/skin breakdown will heal within 14 weeks Date Initiated: 02/27/2015 Goal Status: Active Interventions: Assess patient/caregiver ability to obtain necessary supplies Assess patient/caregiver ability to perform ulcer/skin care regimen upon admission and as needed Assess ulceration(s) every visit Provide education on ulcer and skin care Treatment Activities: Skin care regimen initiated : 04/18/2015 Notes: Electronic Signature(s) MERANDA, DECHAINE ([id number]) Signed: 04/18/2015 3:36:04 PM By: [name] BSN, [name] Entered By: [name] on 04/18/2015 08:27:40 Dimaggio,  Ameenah ([id number]) -------------------------------------------------------------------------------- Pain Assessment Details Patient Name: FOLASADE, MOOTY Date of Service: 04/18/2015 8:00 AM Medical Record Number:  [id number] Patient Account Number: [id number] Date of Birth/Sex: 1927-07-29 (81 y.o. Female) Treating [name]: [name], [place]ox[place] Sink Primary Care Physician: Gloria Laurence Other Clinician: Referring Physician: Loney Laurence Treating Physician/Extender: Gloria Re in Treatment: 7 Active Problems Location of Pain Severity and Description of Pain Patient Has Paino No Site Locations Pain Management and Medication Current Pain Management: Electronic Signature(s) Signed: 04/18/2015 3:36:04 PM By: [name] BSN, [name] Entered By: [name] on 04/18/2015 08:19:28 [name] ([id number]) -------------------------------------------------------------------------------- Wound Assessment Details Patient Name: [name] Date of Service: 04/18/2015 8:00 AM Medical Record Number: [id number] Patient Account Number: [id number] Date of Birth/Sex: [birth year] (81 y.o. Female) Treating [name]: [name], [place] Sink Primary Care Physician: Gloria Laurence Other Clinician: Referring Physician: Loney Laurence Treating Physician/Extender: Gloria Re in Treatment: 7 Wound Status Wound Number: 1 Primary Pressure Ulcer Etiology: Wound Location: Left Calcaneous Wound Open Wounding Event: Pressure Injury Status: Date Acquired: 01/30/2015 Comorbid Anemia, Hypertension, Type II Weeks Of Treatment: 7 History: Diabetes, History of pressure wounds, Clustered Wound: No Osteoarthritis, Dementia Photos Photo Uploaded By: [name] on 04/18/2015 15:02:22 Wound Measurements Length: (cm) 3.5 Width: (cm) 3.5 Depth: (cm) 0.1 Area: (cm) 9.621 Volume: (cm) 0.962 % Reduction in Area: 0% % Reduction in Volume: 0% Epithelialization: None Tunneling: No Undermining: No Wound Description Classification: Unstageable/Unclassified Foul Od Diabetic Severity (Wagner): Grade 1 Wound Margin: Indistinct, nonvisible Exudate Amount: Small Eduardo, Jonea ([id number]) or After  Cleansing: No Exudate Type: Serosanguineous Exudate Color: red, brown Wound Bed Granulation Amount: None Present (0%) Exposed Structure Necrotic Amount: Large (67-100%) Fascia Exposed: No Necrotic Quality: Eschar, Adherent Slough Fat Layer Exposed: No Tendon Exposed: No Muscle Exposed: No Joint Exposed: No Bone Exposed: No Limited to Skin Breakdown Periwound Skin Texture Texture Color No Abnormalities Noted: No No Abnormalities Noted: No Callus: No Atrophie Blanche: No Crepitus: No Cyanosis: No Excoriation: No Ecchymosis: No Fluctuance: No Erythema: No Friable: No Hemosiderin Staining: No Induration: No Mottled: No Localized Edema: No Pallor: No Rash: No Rubor: No Scarring: No Temperature / Pain Moisture Temperature: No Abnormality No Abnormalities Noted: No Dry / Scaly: No Maceration: No Moist: Yes Wound Preparation Ulcer Cleansing: Rinsed/Irrigated with Saline Topical Anesthetic Applied: None Electronic Signature(s) Signed: 04/18/2015 3:36:04 PM By: [name] BSN, [name] Entered By: [name] on 04/18/2015 08:23:44 Minkin, [name] ([id number]) -------------------------------------------------------------------------------- Vitals Details Patient Name: [name] Date of Service: 04/18/2015 8:00 AM Medical Record Number: [id number] Patient Account Number: [id number] Date of Birth/Sex: [birth year] (81 y.o. Female) Treating [name]: [name], [name] Primary Care Physician: Gloria Laurence Other Clinician: Referring Physician: Loney Laurence Treating Physician/Extender: Gloria Re in Treatment: 7 Vital Signs Time Taken: 08:19 Temperature (F): 98.3 Height (in): 64 Pulse (bpm): 79 Respiratory Rate (breaths/min): 18 Blood Pressure (mmHg): 194/99 Reference Range: 80 - 120 mg / dl Electronic Signature(s) Signed: 04/18/2015 3:36:04 PM By: [name] BSN, [name] Entered By: [name] on 04/18/2015 08:38:41

## 2015-04-25 ENCOUNTER — Ambulatory Visit: Payer: Medicare Other | Admitting: Surgery

## 2015-05-01 ENCOUNTER — Ambulatory Visit: Payer: Medicare Other | Admitting: Surgery

## 2015-05-30 ENCOUNTER — Encounter: Payer: Medicare Other | Attending: Surgery | Admitting: Surgery

## 2015-05-30 DIAGNOSIS — I10 Essential (primary) hypertension: Secondary | ICD-10-CM | POA: Insufficient documentation

## 2015-05-30 DIAGNOSIS — L89620 Pressure ulcer of left heel, unstageable: Secondary | ICD-10-CM | POA: Insufficient documentation

## 2015-05-30 DIAGNOSIS — L97422 Non-pressure chronic ulcer of left heel and midfoot with fat layer exposed: Secondary | ICD-10-CM | POA: Insufficient documentation

## 2015-05-30 DIAGNOSIS — E1165 Type 2 diabetes mellitus with hyperglycemia: Secondary | ICD-10-CM | POA: Diagnosis not present

## 2015-05-30 DIAGNOSIS — R339 Retention of urine, unspecified: Secondary | ICD-10-CM | POA: Diagnosis not present

## 2015-05-30 DIAGNOSIS — E039 Hypothyroidism, unspecified: Secondary | ICD-10-CM | POA: Diagnosis not present

## 2015-05-30 DIAGNOSIS — Z9889 Other specified postprocedural states: Secondary | ICD-10-CM | POA: Insufficient documentation

## 2015-05-30 DIAGNOSIS — E11621 Type 2 diabetes mellitus with foot ulcer: Secondary | ICD-10-CM | POA: Insufficient documentation

## 2015-05-30 DIAGNOSIS — G309 Alzheimer's disease, unspecified: Secondary | ICD-10-CM | POA: Diagnosis not present

## 2015-05-31 NOTE — Progress Notes (Addendum)
[name] ([id number]) Visit Report for 05/30/2015 Chief Complaint Document Details Patient Name: [name] Date of Service: 05/30/2015 2:00 PM Medical Record Patient Account Number: [id number] Number: Afful, RN, BSN, Treating RN: [birth year] (82 y.o. [place] Sink Date of Birth/Sex: Female) Other Clinician: Primary Care Physician: CAMPBELL, MARGARET Treating Stori Royse Referring Physician: Loney Laurence Physician/Extender: Weeks in Treatment: 13 Information Obtained from: Patient Chief Complaint patient is back to see [place] today for diabetic foot ulcer which she's had for several months. Electronic Signature(s) Signed: 05/30/2015 2:20:45 PM By: Evlyn Kanner MD, FACS Entered By: Evlyn Kanner on 05/30/2015 14:20:45 [name] ([id number]) -------------------------------------------------------------------------------- HPI Details Patient Name: [name] Date of Service: 05/30/2015 2:00 PM Medical Record Patient Account Number: [id number] Number: Afful, RN, BSN, Treating RN: 02-14-1928 (82 y.o. [place] Sink Date of Birth/Sex: Female) Other Clinician: Primary Care Physician: CAMPBELL, MARGARET Treating Junette Bernat Referring Physician: Loney Laurence Physician/Extender: Weeks in Treatment: 13 History of Present Illness Location: left heel and dorsum of the foot Quality: Patient reports No Pain. Severity: Patient states wound (s) are getting better. Duration: the patient has had surgery in early December and had vascular testing ordered but not done. Timing: Pain in wound is Intermittent (comes and goes Context: The wound appeared gradually over time Modifying Factors: Other treatment(s) tried include: local dressings and offloading. surgery done by Dr. Graciela Husbands for debridement of the left heel wound Associated Signs and Symptoms: Patient reports having difficulty standing for long periods. HPI Description: 82 year old patient with past  medical history of Alzheimer's dementia, diabetes mellitus, hypertension and recent history of right hip arthroplasty in August 2016. Her past medical history is significant also for hypothyroidism, hypertension, urinary retention, dementia. 04/11/2015 -- she has had a lot of pain and left heel and there has been a lot of drainage today with purulent material. He has not been running fever. 04/18/2015 -- last week she was here with a florid infection of her left heel and I had sent her to the hospital for admission. She was admitted between 12/ 2 and 12/ 5 and she underwent a debridement of the left heel abscess with placement of antibiotic beads with vancomycin, by Dr. Linus Galas. Her cultures grew gram- negative bacteria, was given vancomycin and Unasyn IV in hospital and she was continued on oral antibiotics and was given documented for 10 days. at the time of discharge Dr. Alberteen Spindle wanted the dressing to be intact for about a week before it would need changing. 05/29/2014 -- she has not been to see [place] for the last 6 weeks and from what I understand she has been in rehabilitation facility. No vascular testing was done as we had requested. She has a diabetic foot ulcer which was graded as a Wagner 3 when she was seen last about a month and a half ago. There is no family member to discuss hyperbaric oxygen therapy and the patient has as Alzheimer's dementia and I do not believe she understands our discussions Electronic Signature(s) Signed: 05/30/2015 2:22:26 PM By: Evlyn Kanner MD, FACS Entered By: Evlyn Kanner on 05/30/2015 14:22:26 [name] ([id number]) -------------------------------------------------------------------------------- Physical Exam Details Patient Name: [name] Date of Service: 05/30/2015 2:00 PM Medical Record Patient Account Number: [id number] Number: Afful, RN, BSN, Treating RN: 05/03/1928 (82 y.o. [place] Sink Date of Birth/Sex: Female) Other  Clinician: Primary Care Physician: CAMPBELL, MARGARET Treating Kalanie Fewell Referring Physician: Loney Laurence Physician/Extender: Weeks in Treatment: 13 Constitutional . Pulse regular. Respirations normal and unlabored. Afebrile. . Eyes Nonicteric. Reactive to light. Ears,  Nose, Mouth, and Throat Lips, teeth, and gums WNL.Marland Kitchen Moist mucosa without lesions. Neck supple and nontender. No palpable supraclavicular or cervical adenopathy. Normal sized without goiter. Respiratory WNL. No retractions.. Cardiovascular Pedal Pulses WNL. No clubbing, cyanosis or edema. Chest Breasts symmetical and no nipple discharge.. Breast tissue WNL, no masses, lumps, or tenderness.. Lymphatic No adneopathy. No adenopathy. No adenopathy. Musculoskeletal Adexa without tenderness or enlargement.. Digits and nails w/o clubbing, cyanosis, infection, petechiae, ischemia, or inflammatory conditions.. Integumentary (Hair, Skin) No suspicious lesions. No crepitus or fluctuance. No peri-wound warmth or erythema. No masses.Marland Kitchen Psychiatric Judgement and insight Intact.. No evidence of depression, anxiety, or agitation.. Notes the left heel has a fairly deep ulcerated area where previous surgery was done by podiatry and there is evidence of necrotic debris but there is no bone exposed. Electronic Signature(s) Signed: 05/30/2015 2:23:09 PM By: Evlyn Kanner MD, FACS Entered By: Evlyn Kanner on 05/30/2015 14:23:09 [name] ([id number]) -------------------------------------------------------------------------------- Physician Orders Details Patient Name: [name] Date of Service: 05/30/2015 2:00 PM Medical Record Patient Account Number: [id number] Number: Afful, RN, BSN, Treating RN: 1927-09-02 (82 y.o. [place] Sink Date of Birth/Sex: Female) Other Clinician: Primary Care Physician: CAMPBELL, MARGARET Treating Aleksia Freiman Referring Physician: Loney Laurence  Physician/Extender: Tania Ade in Treatment: 10 Verbal / Phone Orders: Yes Clinician: Afful, RN, BSN, Rita Read Back and Verified: Yes Diagnosis Coding Wound Cleansing Wound #1 Left Calcaneous o Clean wound with Normal Saline. Primary Wound Dressing Wound #1 Left Calcaneous o Santyl Ointment Secondary Dressing Wound #1 Left Calcaneous o Gauze and Kerlix/Conform Dressing Change Frequency Wound #1 Left Calcaneous o Change dressing every other day. - for home health purposes Follow-up Appointments Wound #1 Left Calcaneous o Return Appointment in 1 week. Off-Loading Wound #1 Left Calcaneous o Heel suspension boot to: - sage boots Home Health Wound #1 Left Calcaneous o Continue Home Health Visits - [facility] o Home Health Nurse may visit PRN to address patientos wound care needs. o FACE TO FACE ENCOUNTER: MEDICARE and MEDICAID PATIENTS: I certify that this patient is under my care and that I had a face-to-face encounter that meets the physician face-to-face encounter requirements with this patient on this date. The encounter with the patient was in whole or in part for the following MEDICAL CONDITION: (primary reason for Home Healthcare) MEDICAL NECESSITY: I certify, that based on my findings, NURSING services are a medically Roediger, [place] ([id number]) necessary home health service. HOME BOUND STATUS: I certify that my clinical findings support that this patient is homebound (i.e., Due to illness or injury, pt requires aid of supportive devices such as crutches, cane, wheelchairs, walkers, the use of special transportation or the assistance of another person to leave their place of residence. There is a normal inability to leave the home and doing so requires considerable and taxing effort. Other absences are for medical reasons / religious services and are infrequent or of short duration when for other reasons). o If current dressing causes regression in wound  condition, may D/C ordered dressing product/s and apply Normal Saline Moist Dressing daily until next Wound Healing Center / Other MD appointment. Notify Wound Healing Center of regression in wound condition at [phone]. o Please direct any NON-WOUND related issues/requests for orders to patient's Primary Care Physician Services and Therapies o Arterial Studies- Unilateral - left leg Electronic Signature(s) Signed: 05/30/2015 3:13:08 PM By: Elpidio Eric BSN, RN Signed: 05/30/2015 4:37:54 PM By: Evlyn Kanner MD, FACS Entered By: Elpidio Eric on 05/30/2015 14:23:12 Lehn, Illyana ([id number]) -------------------------------------------------------------------------------- Problem List Details  Patient Name: SABRIN, DUNLEVY Date of Service: 05/30/2015 2:00 PM Medical Record Patient Account Number: [id number] Number: Afful, RN, BSN, Treating RN: 07-12-1927 (82 y.o. 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/30/2015 Yes layer exposed Inactive Problems Resolved Problems ICD-10 Code Description Active Date Resolved Date L89.521 Pressure ulcer of left ankle, stage 1 02/27/2015 02/27/2015 L03.116 Cellulitis of left lower limb 04/11/2015 04/11/2015 Electronic Signature(s) Signed: 05/30/2015 2:20:16 PM By: Evlyn Kanner MD, FACS Entered By: Evlyn Kanner on 05/30/2015 14:20:16 Holway, [name] ([id number]) -------------------------------------------------------------------------------- Progress  Note Details Patient Name: [name] Date of Service: 05/30/2015 2:00 PM Medical Record Patient Account Number: [id number] Number: Afful, RN, BSN, Treating RN: [birth year] (82 y.o. [place] Sink Date of Birth/Sex: Female) Other Clinician: Primary Care Physician: CAMPBELL, MARGARET Treating Marie Chow Referring Physician: Loney Laurence Physician/Extender: Weeks in Treatment: 13 Subjective Chief Complaint Information obtained from Patient patient is back to see [place] today for diabetic foot ulcer which she's had for several months. History of Present Illness (HPI) The following HPI elements were documented for the patient's wound: Location: left heel and dorsum of the foot Quality: Patient reports No Pain. Severity: Patient states wound (s) are getting better. Duration: the patient has had surgery in early December and had vascular testing ordered but not done. Timing: Pain in wound is Intermittent (comes and goes Context: The wound appeared gradually over time Modifying Factors: Other treatment(s) tried include: local dressings and offloading. surgery done by Dr. Graciela Husbands for debridement of the left heel wound Associated Signs and Symptoms: Patient reports having difficulty standing for long periods. 82 year old patient with past medical history of Alzheimer's dementia, diabetes mellitus, hypertension and recent history of right hip arthroplasty in August 2016. Her past medical history is significant also for hypothyroidism, hypertension, urinary retention, dementia. 04/11/2015 -- she has had a lot of pain and left heel and there has been a lot of drainage today with purulent material. He has not been running fever. 04/18/2015 -- last week she was here with a florid infection of her left heel and I had sent her to the hospital for admission. She was admitted between 12/ 2 and 12/ 5 and she underwent a debridement of the left heel abscess with placement of antibiotic beads  with vancomycin, by Dr. Linus Galas. Her cultures grew gram- negative bacteria, was given vancomycin and Unasyn IV in hospital and she was continued on oral antibiotics and was given documented for 10 days. at the time of discharge Dr. Alberteen Spindle wanted the dressing to be intact for about a week before it would need changing. 05/29/2014 -- she has not been to see [place] for the last 6 weeks and from what I understand she has been in rehabilitation facility. No vascular testing was done as we had requested. She has a diabetic foot ulcer which was graded as a Wagner 3 when she was seen last about a month and a half ago. There is no family member to discuss hyperbaric oxygen therapy and the patient has as Alzheimer's dementia and I do not believe she understands our discussions Shed, Chamika ([id number]) Objective Constitutional Pulse regular. Respirations normal and unlabored. Afebrile. Vitals  Time Taken: 2:01 PM, Height: 64 in, Temperature: 98 F, Pulse: 82 bpm, Respiratory Rate: 18 breaths/min, Blood Pressure: 130/54 mmHg. Eyes Nonicteric. Reactive to light. Ears, Nose, Mouth, and Throat Lips, teeth, and gums WNL.Marland Kitchen Moist mucosa without lesions. Neck supple and nontender. No palpable supraclavicular or cervical adenopathy. Normal sized without goiter. Respiratory WNL. No retractions.. Cardiovascular Pedal Pulses WNL. No clubbing, cyanosis or edema. Chest Breasts symmetical and no nipple discharge.. Breast tissue WNL, no masses, lumps, or tenderness.. Lymphatic No adneopathy. No adenopathy. No adenopathy. Musculoskeletal Adexa without tenderness or enlargement.. Digits and nails w/o clubbing, cyanosis, infection, petechiae, ischemia, or inflammatory conditions.Marland Kitchen Psychiatric Judgement and insight Intact.. No evidence of depression, anxiety, or agitation.. General Notes: the left heel has a fairly deep ulcerated area where previous surgery was done by podiatry and there is evidence of  necrotic debris but there is no bone exposed. Integumentary (Hair, Skin) No suspicious lesions. No crepitus or fluctuance. No peri-wound warmth or erythema. No masses.. Wound #1 status is Open. Original cause of wound was Pressure Injury. The wound is located on the Left Calcaneous. The wound measures 2.4cm length x 3cm width x 0.1cm depth; 5.655cm^2 area and 0.565cm^3 volume. The wound is limited to skin breakdown. There is no tunneling or undermining noted. Othman, Adrena ([id number]) There is a small amount of serosanguineous drainage noted. The wound margin is indistinct and nonvisible. There is no granulation within the wound bed. There is a large (67-100%) amount of necrotic tissue within the wound bed including Adherent Slough. The periwound skin appearance exhibited: Moist. The periwound skin appearance did not exhibit: Callus, Crepitus, Excoriation, Fluctuance, Friable, Induration, Localized Edema, Rash, Scarring, Dry/Scaly, Maceration, Atrophie Blanche, Cyanosis, Ecchymosis, Hemosiderin Staining, Mottled, Pallor, Rubor, Erythema. Periwound temperature was noted as No Abnormality. The periwound has tenderness on palpation. 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 It is not possible to discuss with her and we have been trying to get hold of her family the last time she was here, regarding further discussions about hyperbaric oxygen therapy. I had also recommended we get arterial duplex studies of her left lower extremity and these are still pending. In the meanwhile I have recommended Santyl ointment locally, off loading and appropriate  nutritional support with evaluation of her recent diabetic control. She will come back and see as next week. Plan This 82 year old patient with Alzheimer's dementia and diabetes mellitus type 2 has aDFU with a Wagner grade 3. Maglione, Kaitlinn ([id number]) It is not possible to discuss with her and we have been trying to get hold of her family the last time she was here, regarding further discussions about hyperbaric oxygen therapy. I had also recommended we get arterial duplex studies of her left lower extremity and these are still pending. In the meanwhile I have recommended Santyl ointment locally, off loading and appropriate nutritional support with evaluation of her recent diabetic control. She will come back and see as next week. Electronic Signature(s) Signed: 05/30/2015 2:26:06 PM By: Evlyn Kanner MD, FACS Entered By: Evlyn Kanner on 05/30/2015 14:26:06 [name] ([id number]) -------------------------------------------------------------------------------- SuperBill Details Patient Name: [name] Date of Service: 05/30/2015 Medical Record Patient Account Number: [id number] Number: Afful, RN, BSN, Treating RN: July 04, 1927 (  82 y.o. [place] Sink Date of Birth/Sex: Female) Other Clinician: Primary Care Physician: CAMPBELL, MARGARET Treating Verniece Encarnacion Referring Physician: Loney Laurence Physician/Extender: Weeks in Treatment: 13 Diagnosis Coding ICD-10 Codes Code Description E11.621 Type 2 diabetes mellitus with foot ulcer L89.620 Pressure ulcer of left heel, unstageable G30.9 Alzheimer's disease, unspecified L97.422 Non-pressure chronic ulcer of left heel and midfoot with fat layer exposed Facility Procedures CPT4 Code: [id number] Description: 54098 - WOUND CARE VISIT-LEV 2 EST PT Modifier: Quantity: 1 Physician Procedures CPT4 Code Description: [id number] 99213 - WC PHYS LEVEL 3 - EST PT ICD-10 Description Diagnosis E11.621 Type 2 diabetes mellitus  with foot ulcer L89.620 Pressure ulcer of left heel, unstageable G30.9 Alzheimer's disease, unspecified L97.422 Non-pressure  chronic ulcer of left heel and midfoo Modifier: t with fat layer Quantity: 1 exposed Electronic Signature(s) Signed: 05/30/2015 2:26:18 PM By: Evlyn Kanner MD, FACS Entered By: Evlyn Kanner on 05/30/2015 14:26:18

## 2015-05-31 NOTE — Progress Notes (Addendum)
CATALINA, SALASAR ([id number]) Visit Report for 05/30/2015 Arrival Information Details Patient Name: [name], [name] Date of Service: 05/30/2015 2:00 PM Medical Record Number: [id number] Patient Account Number: [id number] Date of Birth/Sex: 26-Apr-1928 (82 y.o. Female) Treating RN: Afful, RN, BSN, [place] Sink Primary Care Physician: Loney Laurence Other Clinician: Referring Physician: Loney Laurence Treating Physician/Extender: Rudene Re in Treatment: 13 Visit Information History Since Last Visit Added or deleted any medications: No Patient Arrived: Wheel Chair Any new allergies or adverse reactions: No Arrival Time: 14:01 Had a fall or experienced change in No Accompanied By: caregiver activities of daily living that may affect Transfer Assistance: None risk of falls: Patient Identification Verified: Yes Hospitalized since last visit: No Secondary Verification Process Yes Has Dressing in Place as Prescribed: No Completed: Pain Present Now: No Patient Requires Transmission- No Based Precautions: Patient Has Alerts: Yes Patient Alerts: Patient on Blood Thinner ABI: Non Compressible Electronic Signature(s) Signed: 05/30/2015 3:13:08 PM By: Elpidio Eric BSN, RN Entered By: Elpidio Eric on 05/30/2015 14:01:29 [name], [name] ([id number]) -------------------------------------------------------------------------------- Clinic Level of Care Assessment Details Patient Name: [name] Date of Service: 05/30/2015 2:00 PM Medical Record Number: [id number] Patient Account Number: [id number] Date of Birth/Sex: [birth year] (82 y.o. Female) Treating RN: Afful, RN, BSN, [place] Sink Primary Care Physician: Loney Laurence Other Clinician: Referring Physician: Loney Laurence Treating Physician/Extender: Rudene Re in Treatment: 13 Clinic Level of Care Assessment Items TOOL 4 Quantity Score  - Use when only an EandM is performed on FOLLOW-UP visit 0 ASSESSMENTS  - Nursing Assessment / Reassessment X - Reassessment of Co-morbidities (includes updates in patient status) 1 10 X - Reassessment of Adherence to Treatment Plan 1 5 ASSESSMENTS - Wound and Skin Assessment / Reassessment X - Simple Wound Assessment / Reassessment - one wound 1 5  - Complex Wound Assessment / Reassessment - multiple wounds 0  - Dermatologic / Skin Assessment (not related to wound area) 0 ASSESSMENTS - Focused Assessment  - Circumferential Edema Measurements - multi extremities 0  - Nutritional Assessment / Counseling / Intervention 0 X - Lower Extremity Assessment (monofilament, tuning fork, pulses) 1 5  - Peripheral Arterial Disease Assessment (using hand held doppler) 0 ASSESSMENTS - Ostomy and/or Continence Assessment and Care  - Incontinence Assessment and Management 0  - Ostomy Care Assessment and Management (repouching, etc.) 0 PROCESS - Coordination of Care X - Simple Patient / Family Education for ongoing care 1 15  - Complex (extensive) Patient / Family Education for ongoing care 0  - Staff obtains Chiropractor, Records, Test Results / Process Orders 0  - Staff telephones HHA, Nursing Homes / Clarify orders / etc 0  - Routine Transfer to another Facility (non-emergent condition) 0 [name], [name] ([id number])  - Routine Hospital Admission (non-emergent condition) 0  - New Admissions / Manufacturing engineer / Ordering NPWT, Apligraf, etc. 0  - Emergency Hospital Admission (emergent condition) 0  - Simple Discharge Coordination 0  - Complex (extensive) Discharge Coordination 0 PROCESS - Special Needs  - Pediatric / Minor Patient Management 0  - Isolation Patient Management 0  - Hearing / Language / Visual special needs 0  - Assessment of Community assistance (transportation, D/C planning, etc.) 0  - Additional assistance / Altered mentation 0  - Support Surface(s) Assessment (bed, cushion, seat, etc.) 0 INTERVENTIONS  - Wound Cleansing / Measurement X - Simple Wound Cleansing - one wound 1 5  - Complex Wound Cleansing - multiple wounds 0 X - Wound Imaging (photographs - any number of wounds) 1 5  -  Wound Tracing (instead of photographs) 0 X - Simple Wound Measurement - one wound 1 5 []  - Complex Wound Measurement - multiple wounds 0 INTERVENTIONS - Wound Dressings X - Small Wound Dressing one or multiple wounds 1 10 []  - Medium Wound Dressing one or multiple wounds 0 []  - Large Wound Dressing one or multiple wounds 0 []  - Application of Medications - topical 0 []  - Application of Medications - injection 0 INTERVENTIONS - Miscellaneous []  - External ear exam 0 [name], [name] ([id number]) []  - Specimen Collection (cultures, biopsies, blood, body fluids, etc.) 0 []  - Specimen(s) / Culture(s) sent or taken to Lab for analysis 0 []  - Patient Transfer (multiple staff / Michiel Sites Lift / Similar devices) 0 []  - Simple Staple / Suture removal (25 or less) 0 []  - Complex Staple / Suture removal (26 or more) 0 []  - Hypo / Hyperglycemic Management (close monitor of Blood Glucose) 0 []  - Ankle / Brachial Index (ABI) - do not check if billed separately 0 X - Vital Signs 1 5 Has the patient been seen at the hospital within the last three years: Yes Total Score: 70 Level Of Care: New/Established - Level 2 Electronic Signature(s) Signed: 05/30/2015 3:13:08 PM By: Elpidio Eric BSN, RN Entered By: Elpidio Eric on 05/30/2015 14:23:42 [name] ([id number]) -------------------------------------------------------------------------------- Encounter Discharge Information Details Patient Name: [name] Date of Service: 05/30/2015 2:00 PM Medical Record Number: [id number] Patient Account Number: [id number] Date of Birth/Sex: [birth year] (82 y.o. Female) Treating RN: Afful, RN, BSN, [place] Sink Primary Care Physician: Loney Laurence Other Clinician: Referring Physician: Loney Laurence Treating  Physician/Extender: Rudene Re in Treatment: 13 Encounter Discharge Information Items Discharge Pain Level: 0 Discharge Condition: Stable Ambulatory Status: Wheelchair Discharge Destination: Nursing Home Transportation: Other Accompanied By: caregiver Schedule Follow-up Appointment: No Medication Reconciliation completed and provided to Patient/Care No Wildon Cuevas: Provided on Clinical Summary of Care: 05/30/2015 Form Type Recipient Paper Patient BM Electronic Signature(s) Signed: 05/30/2015 3:13:08 PM By: Elpidio Eric BSN, RN Previous Signature: 05/30/2015 2:23:59 PM Version By: Gwenlyn Perking Entered By: Elpidio Eric on 05/30/2015 14:24:58 [name], [name] ([id number]) -------------------------------------------------------------------------------- Lower Extremity Assessment Details Patient Name: [name] Date of Service: 05/30/2015 2:00 PM Medical Record Number: [id number] Patient Account Number: [id number] Date of Birth/Sex: 05/24/1927 (82 y.o. Female) Treating RN: Afful, RN, BSN, [place] Sink Primary Care Physician: Loney Laurence Other Clinician: Referring Physician: Loney Laurence Treating Physician/Extender: Rudene Re in Treatment: 13 Vascular Assessment Pulses: Posterior Tibial Dorsalis Pedis Palpable: [Left:No] Extremity colors, hair growth, and conditions: Extremity Color: [Left:Dusky] Hair Growth on Extremity: [Left:No] Temperature of Extremity: [Left:Warm] Capillary Refill: [Left:< 3 seconds] Electronic Signature(s) Signed: 05/30/2015 1:55:56 PM By: Elpidio Eric BSN, RN Entered By: Elpidio Eric on 05/30/2015 13:55:56 [name], [name] ([id number]) -------------------------------------------------------------------------------- Multi Wound Chart Details Patient Name: [name] Date of Service: 05/30/2015 2:00 PM Medical Record Number: [id number] Patient Account Number: [id number] Date of Birth/Sex: [birth year] (82 y.o.  Female) Treating RN: Clover Mealy, RN, BSN, [place] Sink Primary Care Physician: Loney Laurence Other Clinician: Referring Physician: Loney Laurence Treating Physician/Extender: Rudene Re in Treatment: 13 Vital Signs Height(in): 64 Pulse(bpm): 82 Weight(lbs): Blood Pressure 130/54 (mmHg): Body Mass Index(BMI): Temperature(F): 98 Respiratory Rate 18 (breaths/min): Photos: [1:No Photos] [N/A:N/A] Wound Location: [1:Left Calcaneous] [N/A:N/A] Wounding Event: [1:Pressure Injury] [N/A:N/A] Primary Etiology: [1:Pressure Ulcer] [N/A:N/A] Comorbid History: [1:Anemia, Hypertension, Type II Diabetes, History of pressure wounds, Osteoarthritis, Dementia] [N/A:N/A] Date Acquired: [1:01/30/2015] [N/A:N/A] Weeks of Treatment: [1:13] [N/A:N/A] Wound Status: [1:Open] [N/A:N/A] Measurements L x W x D 2.4x3x0.1 [N/A:N/A] (cm) Area (  cm) : [1:5.655] [N/A:N/A] Volume (cm) : [1:0.565] [N/A:N/A] % Reduction in Area: [1:41.20%] [N/A:N/A] % Reduction in Volume: 41.30% [N/A:N/A] Classification: [1:Unstageable/Unclassified] [N/A:N/A] HBO Classification: [1:Grade 1] [N/A:N/A] Exudate Amount: [1:Small] [N/A:N/A] Exudate Type: [1:Serosanguineous] [N/A:N/A] Exudate Color: [1:red, brown] [N/A:N/A] Wound Margin: [1:Indistinct, nonvisible] [N/A:N/A] Granulation Amount: [1:None Present (0%)] [N/A:N/A] Necrotic Amount: [1:Large (67-100%)] [N/A:N/A] Exposed Structures: [1:Fascia: No Fat: No Tendon: No Muscle: No Joint: No] [N/A:N/A] Bone: No Limited to Skin Breakdown Epithelialization: None N/A N/A Periwound Skin Texture: Edema: No N/A N/A Excoriation: No Induration: No Callus: No Crepitus: No Fluctuance: No Friable: No Rash: No Scarring: No Periwound Skin Moist: Yes N/A N/A Moisture: Maceration: No Dry/Scaly: No Periwound Skin Color: Atrophie Blanche: No N/A N/A Cyanosis: No Ecchymosis: No Erythema: No Hemosiderin Staining: No Mottled: No Pallor: No Rubor: No Temperature: No  Abnormality N/A N/A Tenderness on Yes N/A N/A Palpation: Wound Preparation: Ulcer Cleansing: N/A N/A Rinsed/Irrigated with Saline Topical Anesthetic Applied: Other: lidocaine Treatment Notes Electronic Signature(s) Signed: 05/30/2015 3:13:08 PM By: Elpidio Eric BSN, RN Entered By: Elpidio Eric on 05/30/2015 14:07:40 [name] ([id number]) -------------------------------------------------------------------------------- Multi-Disciplinary Care Plan Details Patient Name: [name] Date of Service: 05/30/2015 2:00 PM Medical Record Number: [id number] Patient Account Number: [id number] Date of Birth/Sex: Jun 15, 1927 (82 y.o. Female) Treating RN: Afful, RN, BSN, [place] Primary Care Physician: Loney Laurence Other Clinician: Referring Physician: Loney Laurence Treating Physician/Extender: Rudene Re in Treatment: 15 Active Inactive Orientation to the Wound Care Program Nursing Diagnoses: Knowledge deficit related to the wound healing center program Goals: Patient/caregiver will verbalize understanding of the Wound Healing Center Program Date Initiated: 02/27/2015 Goal Status: Active Interventions: Provide education on orientation to the wound center Notes: Pressure Nursing Diagnoses: Knowledge deficit related to management of pressures ulcers Potential for impaired tissue integrity related to pressure, friction, moisture, and shear Goals: Patient will remain free from development of additional pressure ulcers Date Initiated: 02/27/2015 Goal Status: Active Patient will remain free of pressure ulcers Date Initiated: 02/27/2015 Goal Status: Active Patient/caregiver will verbalize risk factors for pressure ulcer development Date Initiated: 02/27/2015 Goal Status: Active Patient/caregiver will verbalize understanding of pressure ulcer management Date Initiated: 02/27/2015 Goal Status: Active Interventions: Assess: immobility, friction, shearing,  incontinence upon admission and as needed [name], [name] ([id number]) Assess offloading mechanisms upon admission and as needed Assess potential for pressure ulcer upon admission and as needed Provide education on pressure ulcers Treatment Activities: Patient referred for home evaluation of offloading devices/mattresses : 05/30/2015 Patient referred for pressure reduction/relief devices : 05/30/2015 Notes: Wound/Skin Impairment Nursing Diagnoses: Impaired tissue integrity Knowledge deficit related to ulceration/compromised skin integrity Goals: Patient/caregiver will verbalize understanding of skin care regimen Date Initiated: 02/27/2015 Goal Status: Active Ulcer/skin breakdown will have a volume reduction of 30% by week 4 Date Initiated: 02/27/2015 Goal Status: Active Ulcer/skin breakdown will have a volume reduction of 50% by week 8 Date Initiated: 02/27/2015 Goal Status: Active Ulcer/skin breakdown will have a volume reduction of 80% by week 12 Date Initiated: 02/27/2015 Goal Status: Active Ulcer/skin breakdown will heal within 14 weeks Date Initiated: 02/27/2015 Goal Status: Active Interventions: Assess patient/caregiver ability to obtain necessary supplies Assess patient/caregiver ability to perform ulcer/skin care regimen upon admission and as needed Assess ulceration(s) every visit Provide education on ulcer and skin care Treatment Activities: Skin care regimen initiated : 05/30/2015 Notes: Electronic Signature(s) [name], [name] ([id number]) Signed: 05/30/2015 3:13:08 PM By: Elpidio Eric BSN, RN Entered By: Elpidio Eric on 05/30/2015 14:07:30 [name], [name] ([id number]) -------------------------------------------------------------------------------- Pain Assessment Details Patient Name: [name] Date of  Service: 05/30/2015 2:00 PM Medical Record Number: [id number] Patient Account Number: [id number] Date of Birth/Sex: Sep 26, 1927 (82 y.o. Female) Treating  RN: Afful, RN, BSN, [place] Sink Primary Care Physician: Loney Laurence Other Clinician: Referring Physician: Loney Laurence Treating Physician/Extender: Rudene Re in Treatment: 13 Active Problems Location of Pain Severity and Description of Pain Patient Has Paino No Site Locations Pain Management and Medication Current Pain Management: Electronic Signature(s) Signed: 05/30/2015 3:13:08 PM By: Elpidio Eric BSN, RN Entered By: Elpidio Eric on 05/30/2015 14:01:35 [name] ([id number]) -------------------------------------------------------------------------------- Patient/Caregiver Education Details Patient Name: [name] Date of Service: 05/30/2015 2:00 PM Medical Record Number: [id number] Patient Account Number: [id number] Date of Birth/Gender: 09/17/1927 (82 y.o. Female) Treating RN: Afful, RN, BSN, [place] Sink Primary Care Physician: Loney Laurence Other Clinician: Referring Physician: Loney Laurence Treating Physician/Extender: Rudene Re in Treatment: 13 Education Assessment Education Provided To: Patient Education Topics Provided Pressure: Methods: Explain/Verbal Responses: State content correctly Welcome To The Wound Care Center: Methods: Explain/Verbal Responses: State content correctly Wound/Skin Impairment: Methods: Explain/Verbal Responses: State content correctly Electronic Signature(s) Signed: 05/30/2015 3:13:08 PM By: Elpidio Eric BSN, RN Entered By: Elpidio Eric on 05/30/2015 14:25:13 [name], [name] ([id number]) -------------------------------------------------------------------------------- Wound Assessment Details Patient Name: [name] Date of Service: 05/30/2015 2:00 PM Medical Record Number: [id number] Patient Account Number: [id number] Date of Birth/Sex: 10-29-1927 (82 y.o. Female) Treating RN: Afful, RN, BSN, Psychologist, clinical Primary Care Physician: Loney Laurence Other Clinician: Referring Physician: Loney Laurence Treating Physician/Extender: Rudene Re in Treatment: 13 Wound Status Wound Number: 1 Primary Pressure Ulcer Etiology: Wound Location: Left Calcaneous Wound Open Wounding Event: Pressure Injury Status: Date Acquired: 01/30/2015 Comorbid Anemia, Hypertension, Type II Weeks Of Treatment: 13 History: Diabetes, History of pressure wounds, Clustered Wound: No Osteoarthritis, Dementia Photos Photo Uploaded By: Elpidio Eric on 05/30/2015 15:04:26 Wound Measurements Length: (cm) 2.4 Width: (cm) 3 Depth: (cm) 0.1 Area: (cm) 5.655 Volume: (cm) 0.565 % Reduction in Area: 41.2% % Reduction in Volume: 41.3% Epithelialization: None Tunneling: No Undermining: No Wound Description Classification: Unstageable/Unclassified Foul Od Diabetic Severity (Wagner): Grade 1 Wound Margin: Indistinct, nonvisible Exudate Amount: Small Exudate Type: Serosanguineous Exudate Color: red, brown or After Cleansing: No Wound Bed Granulation Amount: None Present (0%) Exposed Structure Necrotic Amount: Large (67-100%) Fascia Exposed: No Necrotic Quality: Adherent Slough Fat Layer Exposed: No [name], [name] ([id number]) Tendon Exposed: No Muscle Exposed: No Joint Exposed: No Bone Exposed: No Limited to Skin Breakdown Periwound Skin Texture Texture Color No Abnormalities Noted: No No Abnormalities Noted: No Callus: No Atrophie Blanche: No Crepitus: No Cyanosis: No Excoriation: No Ecchymosis: No Fluctuance: No Erythema: No Friable: No Hemosiderin Staining: No Induration: No Mottled: No Localized Edema: No Pallor: No Rash: No Rubor: No Scarring: No Temperature / Pain Moisture Temperature: No Abnormality No Abnormalities Noted: No Tenderness on Palpation: Yes Dry / Scaly: No Maceration: No Moist: Yes Wound Preparation Ulcer Cleansing: Rinsed/Irrigated with Saline Topical Anesthetic Applied: Other: lidocaine, Treatment Notes Wound #1 (Left Calcaneous) 1.  Cleansed with: Clean wound with Normal Saline 4. Dressing Applied: Santyl Ointment 5. Secondary Dressing Applied Gauze and Kerlix/Conform 7. Secured with Secretary/administrator) Signed: 05/30/2015 3:13:08 PM By: Elpidio Eric BSN, RN Entered By: Elpidio Eric on 05/30/2015 14:06:14 [name] ([id number]) -------------------------------------------------------------------------------- Vitals Details Patient Name: [name] Date of Service: 05/30/2015 2:00 PM Medical Record Number: [id number] Patient Account Number: [id number] Date of Birth/Sex: [birth year] (82 y.o. Female) Treating RN: Afful, RN, BSN, [place] Sink Primary Care Physician: Loney Laurence Other Clinician: Referring Physician: Loney Laurence Treating Physician/Extender: Rudene Re in  Treatment: 13 Vital Signs Time Taken: 14:01 Temperature (F): 98 Height (in): 64 Pulse (bpm): 82 Respiratory Rate (breaths/min): 18 Blood Pressure (mmHg): 130/54 Reference Range: 80 - 120 mg / dl Electronic Signature(s) Signed: 05/30/2015 3:13:08 PM By: Elpidio Eric BSN, RN Entered By: Elpidio Eric on 05/30/2015 14:04:36

## 2015-06-06 ENCOUNTER — Encounter: Payer: Medicare Other | Admitting: Surgery

## 2015-06-06 DIAGNOSIS — E11621 Type 2 diabetes mellitus with foot ulcer: Secondary | ICD-10-CM | POA: Diagnosis not present

## 2015-06-07 NOTE — Progress Notes (Addendum)
[name] ([id number]) Visit Report for 06/06/2015 Chief Complaint Document Details Patient Name: [name], [name] 06/06/2015 12:45 Date of Service: PM Medical Record [id number] Number: Patient Account Number: [id number] 1927-09-24 (82 y.o. Treating RN: Clover Mealy, RN, BSN, [place] Sink Date of Birth/Sex: Female) Other Clinician: Primary Care Physician: CAMPBELL, MARGARET Treating Kennett Symes Referring Physician: Loney Laurence Physician/Extender: Weeks in Treatment: 14 Information Obtained from: Patient Chief Complaint patient is back to see [place] today for diabetic foot ulcer which she's had for several months. Electronic Signature(s) Signed: 06/06/2015 1:26:49 PM By: Evlyn Kanner MD, FACS Entered By: Evlyn Kanner on 06/06/2015 13:26:49 [place], [name] ([id number]) -------------------------------------------------------------------------------- Debridement Details Patient Name: [name] 06/06/2015 12:45 Date of Service: PM Medical Record [id number] Number: Patient Account Number: [id number] [birth year] (82 y.o. Treating RN: Afful, RN, BSN, [place] Sink Date of Birth/Sex: Female) Other Clinician: Primary Care Physician: CAMPBELL, MARGARET Treating Toccara Alford Referring Physician: Loney Laurence Physician/Extender: Weeks in Treatment: 14 Debridement Performed for Wound #1 Left Calcaneous Assessment: Performed By: Physician Evlyn Kanner, MD Debridement: Debridement Pre-procedure Yes Verification/Time Out Taken: Start Time: 13:13 Pain Control: Lidocaine 4% Topical Solution Level: Skin/Subcutaneous Tissue Total Area Debrided (L x 2 (cm) x 3 (cm) = 6 (cm) W): Tissue and other Non-Viable, Exudate, Fibrin/Slough, Subcutaneous material debrided: Instrument: Curette, Forceps, Scissors Bleeding: Minimum Hemostasis Achieved: Pressure End Time: 13:18 Procedural Pain: 3 Post Procedural Pain: 3 Response to Treatment: Procedure was tolerated well Post Debridement  Measurements of Total Wound Length: (cm) 2 Stage: Unstageable/Unclassified Width: (cm) 3 Depth: (cm) 0.4 Volume: (cm) 1.885 Post Procedure Diagnosis Same as Pre-procedure Electronic Signature(s) Signed: 06/06/2015 1:26:43 PM By: Evlyn Kanner MD, FACS Signed: 06/06/2015 2:47:16 PM By: Elpidio Eric BSN, RN Entered By: Evlyn Kanner on 06/06/2015 13:26:43 [place], [name] ([id number]) -------------------------------------------------------------------------------- HPI Details Patient Name: [name] 06/06/2015 12:45 Date of Service: PM Medical Record [id number] Number: Patient Account Number: [id number] 14-Mar-1928 (82 y.o. Treating RN: Clover Mealy, RN, BSN, [place] Sink Date of Birth/Sex: Female) Other Clinician: Primary Care Physician: CAMPBELL, MARGARET Treating Karlynn Furrow Referring Physician: Loney Laurence Physician/Extender: Weeks in Treatment: 14 History of Present Illness Location: left heel and dorsum of the foot Quality: Patient reports No Pain. Severity: Patient states wound (s) are getting better. Duration: the patient has had surgery in early December and had vascular testing ordered but not done. Timing: Pain in wound is Intermittent (comes and goes Context: The wound appeared gradually over time Modifying Factors: Other treatment(s) tried include: local dressings and offloading. surgery done by Dr. Graciela Husbands for debridement of the left heel wound Associated Signs and Symptoms: Patient reports having difficulty standing for long periods. HPI Description: 82 year old patient with past medical history of Alzheimer's dementia, diabetes mellitus, hypertension and recent history of right hip arthroplasty in August 2016. Her past medical history is significant also for hypothyroidism, hypertension, urinary retention, dementia. 04/11/2015 -- she has had a lot of pain and left heel and there has been a lot of drainage today with purulent material. He has not been running  fever. 04/18/2015 -- last week she was here with a florid infection of her left heel and I had sent her to the hospital for admission. She was admitted between 12/ 2 and 12/ 5 and she underwent a debridement of the left heel abscess with placement of antibiotic beads with vancomycin, by Dr. Linus Galas. Her cultures grew gram- negative bacteria, was given vancomycin and Unasyn IV in hospital and she was continued on oral antibiotics and was given documented for 10 days. at the time of discharge Dr. Alberteen Spindle  wanted the dressing to be intact for about a week before it would need changing. 05/29/2014 -- she has not been to see [place] for the last 6 weeks and from what I understand she has been in rehabilitation facility. No vascular testing was done as we had requested. She has a diabetic foot ulcer which was graded as a Wagner 3 when she was seen last about a month and a half ago. There is no family member to discuss hyperbaric oxygen therapy and the patient has as Alzheimer's dementia and I do not believe she understands our discussions Electronic Signature(s) Signed: 06/06/2015 1:26:54 PM By: Evlyn Kanner MD, FACS Entered By: Evlyn Kanner on 06/06/2015 13:26:54 Bor[place]o, [name] ([id number]) -------------------------------------------------------------------------------- Physical Exam Details Patient Name: [name] 06/06/2015 12:45 Date of Service: PM Medical Record [id number] Number: Patient Account Number: [id number] 1927-11-20 (82 y.o. Treating RN: Clover Mealy, RN, BSN, [place] Sink Date of Birth/Sex: Female) Other Clinician: Primary Care Physician: CAMPBELL, MARGARET Treating Neviah Braud Referring Physician: Loney Laurence Physician/Extender: Weeks in Treatment: 14 Constitutional . Pulse regular. Respirations normal and unlabored. Afebrile. . Eyes Nonicteric. Reactive to light. Ears, Nose, Mouth, and Throat Lips, teeth, and gums WNL.Marland Kitchen Moist mucosa without lesions. Neck supple and  nontender. No palpable supraclavicular or cervical adenopathy. Normal sized without goiter. Respiratory WNL. No retractions.. Cardiovascular Pedal Pulses WNL. No clubbing, cyanosis or edema. Lymphatic No adneopathy. No adenopathy. No adenopathy. Musculoskeletal Adexa without tenderness or enlargement.. Digits and nails w/o clubbing, cyanosis, infection, petechiae, ischemia, or inflammatory conditions.. Integumentary (Hair, Skin) No suspicious lesions. No crepitus or fluctuance. No peri-wound warmth or erythema. No masses.Marland Kitchen Psychiatric Judgement and insight Intact.. No evidence of depression, anxiety, or agitation.. Notes the left heel has some eschar on the sides and the base of the ulcer has some slough and this was all sharply debrided down to the subcutaneous tissue with forceps scissors and curette. There is no bone probing down. Electronic Signature(s) Signed: 06/06/2015 1:27:34 PM By: Evlyn Kanner MD, FACS Entered By: Evlyn Kanner on 06/06/2015 13:27:34 AMYE, GREGO ([id number]) -------------------------------------------------------------------------------- Physician Orders Details Patient Name: RACHAEL, FERRIE 06/06/2015 12:45 Date of Service: PM Medical Record [id number] Number: Patient Account Number: [id number] May 24, 1927 (82 y.o. Treating RN: Clover Mealy, RN, BSN, [place] Sink Date of Birth/Sex: Female) Other Clinician: Primary Care Physician: CAMPBELL, MARGARET Treating Trystan Akhtar Referring Physician: Loney Laurence Physician/Extender: Weeks in Treatment: 14 Verbal / Phone Orders: Yes Clinician: Afful, RN, BSN, Rita Read Back and Verified: Yes Diagnosis Coding Wound Cleansing Wound #1 Left Calcaneous o Clean wound with Normal Saline. Primary Wound Dressing Wound #1 Left Calcaneous o Santyl Ointment Secondary Dressing Wound #1 Left Calcaneous o Gauze and Kerlix/Conform Dressing Change Frequency Wound #1 Left Calcaneous o Change dressing every  other day. - for home health purposes Follow-up Appointments Wound #1 Left Calcaneous o Return Appointment in 1 week. Off-Loading Wound #1 Left Calcaneous o Heel suspension boot to: - sage boots Home Health Wound #1 Left Calcaneous o Continue Home Health Visits - [facility] o Home Health Nurse may visit PRN to address patientos wound care needs. o FACE TO FACE ENCOUNTER: MEDICARE and MEDICAID PATIENTS: I certify that this patient is under my care and that I had a face-to-face encounter that meets the physician face-to-face encounter requirements with this patient on this date. The encounter with the patient was in whole or in part for the following MEDICAL CONDITION: (primary reason for Home Healthcare) MEDICAL NECESSITY: I certify, that based on my findings, NURSING services are a medically Mccorkel, Vanya ([id number]) necessary home health  service. HOME BOUND STATUS: I certify that my clinical findings support that this patient is homebound (i.e., Due to illness or injury, pt requires aid of supportive devices such as crutches, cane, wheelchairs, walkers, the use of special transportation or the assistance of another person to leave their place of residence. There is a normal inability to leave the home and doing so requires considerable and taxing effort. Other absences are for medical reasons / religious services and are infrequent or of short duration when for other reasons). o If current dressing causes regression in wound condition, may D/C ordered dressing product/s and apply Normal Saline Moist Dressing daily until next Wound Healing Center / Other MD appointment. Notify Wound Healing Center of regression in wound condition at [phone]. o Please direct any NON-WOUND related issues/requests for orders to patient's Primary Care Physician Electronic Signature(s) Signed: 06/06/2015 2:10:46 PM By: Elpidio Eric BSN, RN Signed: 06/06/2015 4:48:22 PM By: Evlyn Kanner  MD, FACS Entered By: Elpidio Eric on 06/06/2015 14:10:46 [place]low, [name] ([id number]) -------------------------------------------------------------------------------- Problem List Details Patient Name: ZILLAH, ALEXIE 06/06/2015 12:45 Date of Service: PM Medical Record [id number] Number: Patient Account Number: [id number] [birth year] (82 y.o. 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 Inactive Problems Resolved Problems ICD-10 Code Description Active Date Resolved Date L89.521 Pressure ulcer of left ankle, stage 1 02/27/2015 02/27/2015 L03.116 Cellulitis of left lower limb 04/11/2015 04/11/2015 Electronic Signature(s) Signed: 06/06/2015 1:26:24 PM By: Evlyn Kanner MD, FACS Entered By: Evlyn Kanner on 06/06/2015 13:26:24 Bergh, [name] ([id number]) -------------------------------------------------------------------------------- Progress Note Details Patient Name: KALANA, YUST 06/06/2015 12:45 Date of Service: PM Medical Record [id number] Number: Patient Account Number: [id number] 1927/07/17 (82 y.o. Treating RN: Clover Mealy, RN, BSN, [place] Sink Date of Birth/Sex: Female) Other Clinician: Primary Care Physician: CAMPBELL, MARGARET Treating Arneisha Kincannon Referring Physician: Loney Laurence Physician/Extender: Weeks in Treatment: 14 Subjective Chief Complaint Information obtained from Patient patient is back to see [place] today for diabetic foot  ulcer which she's had for several months. History of Present Illness (HPI) The following HPI elements were documented for the patient's wound: Location: left heel and dorsum of the foot Quality: Patient reports No Pain. Severity: Patient states wound (s) are getting better. Duration: the patient has had surgery in early December and had vascular testing ordered but not done. Timing: Pain in wound is Intermittent (comes and goes Context: The wound appeared gradually over time Modifying Factors: Other treatment(s) tried include: local dressings and offloading. surgery done by Dr. Graciela Husbands for debridement of the left heel wound Associated Signs and Symptoms: Patient reports having difficulty standing for long periods. 82 year old patient with past medical history of Alzheimer's dementia, diabetes mellitus, hypertension and recent history of right hip arthroplasty in August 2016. Her past medical history is significant also for hypothyroidism, hypertension, urinary retention, dementia. 04/11/2015 -- she has had a lot of pain and left heel and there has been a lot of drainage today with purulent material. He has not been running fever. 04/18/2015 -- last week she was here with a florid infection of her left heel and I had sent her to the hospital for admission. She  was admitted between 12/ 2 and 12/ 5 and she underwent a debridement of the left heel abscess with placement of antibiotic beads with vancomycin, by Dr. Linus Galas. Her cultures grew gram- negative bacteria, was given vancomycin and Unasyn IV in hospital and she was continued on oral antibiotics and was given documented for 10 days. at the time of discharge Dr. Alberteen Spindle wanted the dressing to be intact for about a week before it would need changing. 05/29/2014 -- she has not been to see [place] for the last 6 weeks and from what I understand she has been in rehabilitation facility. No vascular testing was done as we had requested. She has a  diabetic foot ulcer which was graded as a Wagner 3 when she was seen last about a month and a half ago. There is no family member to discuss hyperbaric oxygen therapy and the patient has as Alzheimer's dementia and I do not believe she understands our discussions Barnhardt, Sansa ([id number]) Objective Constitutional Pulse regular. Respirations normal and unlabored. Afebrile. Vitals Time Taken: 1:05 PM, Height: 64 in, Temperature: 98.1 F, Pulse: 80 bpm, Respiratory Rate: 18 breaths/min, Blood Pressure: 127/71 mmHg. Eyes Nonicteric. Reactive to light. Ears, Nose, Mouth, and Throat Lips, teeth, and gums WNL.Marland Kitchen Moist mucosa without lesions. Neck supple and nontender. No palpable supraclavicular or cervical adenopathy. Normal sized without goiter. Respiratory WNL. No retractions.. Cardiovascular Pedal Pulses WNL. No clubbing, cyanosis or edema. Lymphatic No adneopathy. No adenopathy. No adenopathy. Musculoskeletal Adexa without tenderness or enlargement.. Digits and nails w/o clubbing, cyanosis, infection, petechiae, ischemia, or inflammatory conditions.Marland Kitchen Psychiatric Judgement and insight Intact.. No evidence of depression, anxiety, or agitation.. General Notes: the left heel has some eschar on the sides and the base of the ulcer has some slough and this was all sharply debrided down to the subcutaneous tissue with forceps scissors and curette. There is no bone probing down. Integumentary (Hair, Skin) No suspicious lesions. No crepitus or fluctuance. No peri-wound warmth or erythema. No masses.. Wound #1 status is Open. Original cause of wound was Pressure Injury. The wound is located on the Left Calcaneous. The wound measures 2cm length x 3cm width x 0.3cm depth; 4.712cm^2 area and 1.414cm^3 volume. The wound is limited to skin breakdown. There is no tunneling or undermining noted. There is a small amount of serosanguineous drainage noted. The wound margin is indistinct and  nonvisible. There is no granulation within the wound bed. There is a large (67-100%) amount of necrotic tissue within the wound Tahir, Rylee ([id number]) bed including Adherent Slough. The periwound skin appearance exhibited: Moist. The periwound skin appearance did not exhibit: Callus, Crepitus, Excoriation, Fluctuance, Friable, Induration, Localized Edema, Rash, Scarring, Dry/Scaly, Maceration, Atrophie Blanche, Cyanosis, Ecchymosis, Hemosiderin Staining, Mottled, Pallor, Rubor, Erythema. Periwound temperature was noted as No Abnormality. The periwound has tenderness on palpation. 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 There was a Skin/Subcutaneous Tissue Debridement ([id number]) debridement with total area of 6 sq cm performed by Evlyn Kanner, MD. with the following instrument(s): Curette, Forceps, and Scissors to remove Non-Viable tissue/material including Exudate, Fibrin/Slough, and Subcutaneous after achieving pain control using Lidocaine 4% Topical Solution. A time out was conducted prior to the start of the procedure. A Minimum amount of  bleeding was controlled with Pressure. The procedure was tolerated well with a pain level of 3 throughout and a pain level of 3 following the procedure. Post Debridement Measurements: 2cm length x 3cm width x 0.4cm depth; 1.885cm^3 volume. Post debridement Stage noted as Unstageable/Unclassified. Post procedure Diagnosis Wound #1: Same as Pre-Procedure Plan Wound Cleansing: Wound #1 Left Calcaneous: Clean wound with Normal Saline. Primary Wound Dressing: Wound #1 Left Calcaneous: Ghee, Vermelle ([id number]) Santyl Ointment Secondary  Dressing: Wound #1 Left Calcaneous: Gauze and Kerlix/Conform Dressing Change Frequency: Wound #1 Left Calcaneous: Change dressing every other day. - for home health purposes Follow-up Appointments: Wound #1 Left Calcaneous: Return Appointment in 1 week. Off-Loading: Wound #1 Left Calcaneous: Heel suspension boot to: - sage boots Home Health: Wound #1 Left Calcaneous: Continue Home Health Visits - [facility] Home Health Nurse may visit PRN to address patient s wound care needs. FACE TO FACE ENCOUNTER: MEDICARE and MEDICAID PATIENTS: I certify that this patient is under my care and that I had a face-to-face encounter that meets the physician face-to-face encounter requirements with this patient on this date. The encounter with the patient was in whole or in part for the following MEDICAL CONDITION: (primary reason for Home Healthcare) MEDICAL NECESSITY: I certify, that based on my findings, NURSING services are a medically necessary home health service. HOME BOUND STATUS: I certify that my clinical findings support that this patient is homebound (i.e., Due to illness or injury, pt requires aid of supportive devices such as crutches, cane, wheelchairs, walkers, the use of special transportation or the assistance of another person to leave their place of residence. There is a normal inability to leave the home and doing so requires considerable and taxing effort. Other absences are for medical reasons / religious services and are infrequent or of short duration when for other reasons). If current dressing causes regression in wound condition, may D/C ordered dressing product/s and apply Normal Saline Moist Dressing daily until next Wound Healing Center / Other MD appointment. Notify Wound Healing Center of regression in wound condition at [phone]. Please direct any NON-WOUND related issues/requests for orders to patient's Primary Care Physician I had also recommended we get arterial  duplex studies of her left lower extremity and these are to be done next Friday. In the meanwhile I have recommended Santyl ointment locally, off loading and appropriate nutritional support with evaluation of her recent diabetic control. She will come back and see as next week. Electronic Signature(s) PAULEEN, GOLEMAN ([id number]) Signed: 06/06/2015 4:49:25 PM By: Evlyn Kanner MD, FACS Previous Signature: 06/06/2015 1:28:25 PM Version By: Evlyn Kanner MD, FACS Entered By: Evlyn Kanner on 06/06/2015 16:49:24 [name] ([id number]) -------------------------------------------------------------------------------- SuperBill Details Patient Name: [name] Date of Service: 06/06/2015 Medical Record Patient Account Number: [id number] Number: Afful, RN, BSN, Treating RN: Apr 13, 1928 (82 y.o. [place] Sink Date of Birth/Sex: Female) Other Clinician: Primary Care Physician: CAMPBELL, MARGARET Treating Verlin Duke Referring Physician: Loney Laurence Physician/Extender: Weeks in Treatment: 14 Diagnosis Coding ICD-10 Codes Code Description E11.621 Type 2 diabetes mellitus with foot ulcer L89.620 Pressure ulcer of left heel, unstageable G30.9 Alzheimer's disease, unspecified L97.422 Non-pressure chronic ulcer of left heel and midfoot with fat layer exposed Facility Procedures CPT4 Code Description: [id number] 11042 - DEB SUBQ TISSUE 20 SQ CM/< ICD-10 Description Diagnosis E11.621 Type 2 diabetes mellitus with foot ulcer L89.620 Pressure ulcer of left heel, unstageable G30.9 Alzheimer's disease, unspecified L97.422 Non-pressure  chronic ulcer of left heel and midfoot Modifier: with fat laye Quantity: 1 r exposed  Physician Procedures CPT4 Code Description: [id number] 11042 - WC PHYS SUBQ TISS 20 SQ CM ICD-10 Description Diagnosis E11.621 Type 2 diabetes mellitus with foot ulcer L89.620 Pressure ulcer of left heel, unstageable G30.9 Alzheimer's disease, unspecified L97.422  Non-pressure  chronic ulcer of left heel and midfoot Modifier: with fat layer Quantity: 1 exposed Electronic Signature(s) Signed: 06/06/2015 1:28:55 PM By: Evlyn Kanner MD, FACS Previous Signature: 06/06/2015 1:28:37 PM Version By: Evlyn Kanner MD, FACS Entered By: Evlyn Kanner on 06/06/2015 13:28:55

## 2015-06-07 NOTE — Progress Notes (Addendum)
[name], [name] ([id number]) Visit Report for 06/06/2015 Arrival Information Details Patient Name: [name], [name] Date of Service: 06/06/2015 12:45 PM Medical Record Number: [id number] Patient Account Number: [id number] Date of Birth/Sex: Oct 17, 1927 (82 y.o. Female) Treating RN: Afful, RN, BSN, [name] Primary Care Physician: [name] Other Clinician: Referring Physician: Loney [name] Treating Physician/Extender: [name] in Treatment: 14 Visit Information History Since Last Visit Added or deleted any medications: No Patient Arrived: Wheel Chair Any new allergies or adverse reactions: No Arrival Time: 13:01 Had a fall or experienced change in No Accompanied By: caregiver activities of daily living that may affect Transfer Assistance: None risk of falls: Patient Identification Verified: Yes Signs or symptoms of abuse/neglect since last No Secondary Verification Process Yes visito Completed: Hospitalized since last visit: No Patient Requires Transmission- No Has Dressing in Place as Prescribed: Yes Based Precautions: Pain Present Now: No Patient Has Alerts: Yes Patient Alerts: Patient on Blood Thinner ABI: Non Compressible Electronic Signature(s) Signed: 06/06/2015 1:01:43 PM By: [name] BSN, RN Entered By: [name] on 06/06/2015 13:01:43 [name] ([id number]) -------------------------------------------------------------------------------- Encounter Discharge Information Details Patient Name: [name] Date of Service: 06/06/2015 12:45 PM Medical Record Number: [id number] Patient Account Number: [id number] Date of Birth/Sex: 04-25-1928 (82 y.o. Female) Treating RN: Afful, RN, BSN, [place] [name] Primary Care Physician: [name] Other Clinician: Referring Physician: Loney [name] Treating Physician/Extender: [name] in Treatment: 14 Encounter Discharge Information Items Discharge Pain Level: 0 Discharge  Condition: Stable Ambulatory Status: Walker Discharge Destination: Home Transportation: Private Auto Accompanied By: caregiver Schedule Follow-up Appointment: No Medication Reconciliation completed No and provided to Patient/Care [name]: Provided on Clinical Summary of Care: 06/06/2015 Form Type Recipient Paper Patient BM Electronic Signature(s) Signed: 06/06/2015 1:28:23 PM By: [name] Entered By: [name] on 06/06/2015 13:28:23 [name] ([id number]) -------------------------------------------------------------------------------- Lower Extremity Assessment Details Patient Name: [name] Date of Service: 06/06/2015 12:45 PM Medical Record Number: [id number] Patient Account Number: [id number] Date of Birth/Sex: 08/16/1927 (82 y.o. Female) Treating RN: Afful, RN, BSN, [place] [name] Primary Care Physician: [name] Other Clinician: Referring Physician: Loney [name] Treating Physician/Extender: [name] in Treatment: 14 Vascular Assessment Pulses: Posterior Tibial Dorsalis Pedis Palpable: [Left:No] Extremity colors, hair growth, and conditions: Extremity Color: [Left:Normal] Hair Growth on Extremity: [Left:Yes] Temperature of Extremity: [Left:Warm] Capillary Refill: [Left:< 3 seconds] Toe Nail Assessment Left: Right: Thick: Yes Discolored: Yes Deformed: No Improper Length and Hygiene: No Electronic Signature(s) Signed: 06/06/2015 1:02:13 PM By: [name] BSN, RN Entered By: [name] on 06/06/2015 13:02:13 [name] ([id number]) -------------------------------------------------------------------------------- Multi Wound Chart Details Patient Name: [name] Date of Service: 06/06/2015 12:45 PM Medical Record Number: [id number] Patient Account Number: [id number] Date of Birth/Sex: [birth year] (82 y.o. Female) Treating RN: Gloria Mealy, RN, BSN,  [name] Primary Care Physician: [name] Other  Clinician: Referring Physician: Loney [name] Treating Physician/Extender: [name] in Treatment: 14 Vital Signs Height(in): 64 Pulse(bpm): 80 Weight(lbs): Blood Pressure 127/71 (mmHg): Body Mass Index(BMI): Temperature(F): 98.1 Respiratory Rate 18 (breaths/min): Photos: [1:No Photos] [N/A:N/A] Wound Location: [1:Left Calcaneous] [N/A:N/A] Wounding Event: [1:Pressure Injury] [N/A:N/A] Primary Etiology: [1:Pressure Ulcer] [N/A:N/A] Comorbid History: [1:Anemia, Hypertension, Type II Diabetes, History of pressure wounds, Osteoarthritis, Dementia] [N/A:N/A] Date Acquired: [1:01/30/2015] [N/A:N/A] Weeks of Treatment: [1:14] [N/A:N/A] Wound Status: [1:Open] [N/A:N/A] Measurements L x W x D 2x3x0.3 [N/A:N/A] (cm) Area (cm) : [1:4.712] [N/A:N/A] Volume (cm) : [1:1.414] [N/A:N/A] % Reduction in Area: [1:51.00%] [N/A:N/A] % Reduction in Volume: -47.00% [N/A:N/A] Classification: [1:Unstageable/Unclassified] [N/A:N/A] HBO Classification: [1:Grade 1] [N/A:N/A] Exudate Amount: [1:Small] [N/A:N/A] Exudate Type: [1:Serosanguineous] [  N/A:N/A] Exudate Color: [1:red, brown] [N/A:N/A] Wound Margin: [1:Indistinct, nonvisible] [N/A:N/A] Granulation Amount: [1:None Present (0%)] [N/A:N/A] Necrotic Amount: [1:Large (67-100%)] [N/A:N/A] Exposed Structures: [1:Fascia: No Fat: No Tendon: No Muscle: No Joint: No] [N/A:N/A] Bone: No Limited to Skin Breakdown Epithelialization: None N/A N/A Periwound Skin Texture: Edema: No N/A N/A Excoriation: No Induration: No Callus: No Crepitus: No Fluctuance: No Friable: No Rash: No Scarring: No Periwound Skin Moist: Yes N/A N/A Moisture: Maceration: No Dry/Scaly: No Periwound Skin Color: Atrophie Blanche: No N/A N/A Cyanosis: No Ecchymosis: No Erythema: No Hemosiderin Staining: No Mottled: No Pallor: No Rubor: No Temperature: No Abnormality N/A N/A Tenderness on Yes N/A N/A Palpation: Wound Preparation: Ulcer  Cleansing: N/A N/A Rinsed/Irrigated with Saline Topical Anesthetic Applied: Other: lidocaine Treatment Notes Electronic Signature(s) Signed: 06/06/2015 2:47:16 PM By: [name] BSN, RN Entered By: [name] on 06/06/2015 13:13:32 [name] ([id number]) -------------------------------------------------------------------------------- Multi-Disciplinary Care Plan Details Patient Name: [name] Date of Service: 06/06/2015 12:45 PM Medical Record Number: [id number] Patient Account Number: [id number] Date of Birth/Sex: [birth year] (82 y.o. Female) Treating RN: Afful, RN, BSN, [place] Primary Care Physician: [name] Other Clinician: Referring Physician: Loney [name] Treating Physician/Extender: [name] in Treatment: 14 Active Inactive Orientation to the Wound Care Program Nursing Diagnoses: Knowledge deficit related to the wound healing center program Goals: Patient/caregiver will verbalize understanding of the Wound Healing Center Program Date Initiated: 02/27/2015 Goal Status: Active Interventions: Provide education on orientation to the wound center Notes: Pressure Nursing Diagnoses: Knowledge deficit related to management of pressures ulcers Potential for impaired tissue integrity related to pressure, friction, moisture, and shear Goals: Patient will remain free from development of additional pressure ulcers Date Initiated: 02/27/2015 Goal Status: Active Patient will remain free of pressure ulcers Date Initiated: 02/27/2015 Goal Status: Active Patient/caregiver will verbalize risk factors for pressure ulcer development Date Initiated: 02/27/2015 Goal Status: Active Patient/caregiver will verbalize understanding of pressure ulcer management Date Initiated: 02/27/2015 Goal Status: Active Interventions: Assess: immobility, friction, shearing, incontinence upon admission and as needed Mullett, Chrystina ([id number]) Assess  offloading mechanisms upon admission and as needed Assess potential for pressure ulcer upon admission and as needed Provide education on pressure ulcers Treatment Activities: Patient referred for home evaluation of offloading devices/mattresses : 06/06/2015 Patient referred for pressure reduction/relief devices : 06/06/2015 Notes: Wound/Skin Impairment Nursing Diagnoses: Impaired tissue integrity Knowledge deficit related to ulceration/compromised skin integrity Goals: Patient/caregiver will verbalize understanding of skin care regimen Date Initiated: 02/27/2015 Goal Status: Active Ulcer/skin breakdown will have a volume reduction of 30% by week 4 Date Initiated: 02/27/2015 Goal Status: Active Ulcer/skin breakdown will have a volume reduction of 50% by week 8 Date Initiated: 02/27/2015 Goal Status: Active Ulcer/skin breakdown will have a volume reduction of 80% by week 12 Date Initiated: 02/27/2015 Goal Status: Active Ulcer/skin breakdown will heal within 14 weeks Date Initiated: 02/27/2015 Goal Status: Active Interventions: Assess patient/caregiver ability to obtain necessary supplies Assess patient/caregiver ability to perform ulcer/skin care regimen upon admission and as needed Assess ulceration(s) every visit Provide education on ulcer and skin care Treatment Activities: Skin care regimen initiated : 06/06/2015 Notes: Electronic Signature(s) MERILYN, PAGAN ([id number]) Signed: 06/06/2015 2:47:16 PM By: [name] BSN, RN Entered By: [name] on 06/06/2015 13:11:00 [name] ([id number]) -------------------------------------------------------------------------------- Pain Assessment Details Patient Name: [name] Date of Service: 06/06/2015 12:45 PM Medical Record Number: [id number] Patient Account Number: [id number] Date of Birth/Sex: 1927-09-18 (82 y.o. Female) Treating RN: Afful, RN, BSN, [place] [name] Primary Care Physician: [name] Other  Clinician: Referring Physician: Orvan Falconer,  MARGARET Treating Physician/Extender: [name] in Treatment: 14 Active Problems Location of Pain Severity and Description of Pain Patient Has Paino Yes Site Locations Pain Location: Pain in Ulcers Pain Management and Medication Current Pain Management: Electronic Signature(s) Signed: 06/06/2015 1:02:30 PM By: [name] BSN, RN Entered By: [name] on 06/06/2015 13:02:30 [name] ([id number]) -------------------------------------------------------------------------------- Patient/Caregiver Education Details Patient Name: [name] Date of Service: 06/06/2015 12:45 PM Medical Record Number: [id number] Patient Account Number: [id number] Date of Birth/Gender: [birth year] (82 y.o. Female) Treating RN: Gloria Mealy, RN, BSN, [place] [name] Primary Care Physician: [name] Other Clinician: Referring Physician: Loney [name] Treating Physician/Extender: [name] in Treatment: 14 Education Assessment Education Provided To: Patient Education Topics Provided Pressure: Methods: Explain/Verbal Responses: State content correctly Welcome To The Wound Care Center: Methods: Explain/Verbal Responses: State content correctly Wound/Skin Impairment: Methods: Explain/Verbal Responses: State content correctly Electronic Signature(s) Signed: 06/06/2015 2:47:16 PM By: [name] BSN, RN Entered By: [name] on 06/06/2015 13:14:54 Luhmann, Johnsie ([id number]) -------------------------------------------------------------------------------- Wound Assessment Details Patient Name: [name] Date of Service: 06/06/2015 12:45 PM Medical Record Number: [id number] Patient Account Number: [id number] Date of Birth/Sex: 01-29-1928 (82 y.o. Female) Treating RN: Afful, RN, BSN, [place] [name] Primary Care Physician: [name] Other Clinician: Referring Physician: Loney [name] Treating Physician/Extender:  [name] in Treatment: 14 Wound Status Wound Number: 1 Primary Pressure Ulcer Etiology: Wound Location: Left Calcaneous Wound Open Wounding Event: Pressure Injury Status: Date Acquired: 01/30/2015 Comorbid Anemia, Hypertension, Type II Weeks Of Treatment: 14 History: Diabetes, History of pressure wounds, Clustered Wound: No Osteoarthritis, Dementia Photos Photo Uploaded By: [name] on 06/06/2015 14:47:35 Wound Measurements Length: (cm) 2 Width: (cm) 3 Depth: (cm) 0.3 Area: (cm) 4.712 Volume: (cm) 1.414 % Reduction in Area: 51% % Reduction in Volume: -47% Epithelialization: None Tunneling: No Undermining: No Wound Description Classification: Unstageable/Unclassified Foul Od Diabetic Severity (Wagner): Grade 1 Wound Margin: Indistinct, nonvisible Exudate Amount: Small Exudate Type: Serosanguineous Exudate Color: red, brown or After Cleansing: No Wound Bed Granulation Amount: None Present (0%) Exposed Structure Necrotic Amount: Large (67-100%) Fascia Exposed: No Necrotic Quality: Adherent Slough Fat Layer Exposed: No Nolde, Everline ([id number]) Tendon Exposed: No Muscle Exposed: No Joint Exposed: No Bone Exposed: No Limited to Skin Breakdown Periwound Skin Texture Texture Color No Abnormalities Noted: No No Abnormalities Noted: No Callus: No Atrophie Blanche: No Crepitus: No Cyanosis: No Excoriation: No Ecchymosis: No Fluctuance: No Erythema: No Friable: No Hemosiderin Staining: No Induration: No Mottled: No Localized Edema: No Pallor: No Rash: No Rubor: No Scarring: No Temperature / Pain Moisture Temperature: No Abnormality No Abnormalities Noted: No Tenderness on Palpation: Yes Dry / Scaly: No Maceration: No Moist: Yes Wound Preparation Ulcer Cleansing: Rinsed/Irrigated with Saline Topical Anesthetic Applied: Other: lidocaine, Treatment Notes Wound #1 (Left Calcaneous) 1. Cleansed with: Clean wound with Normal  Saline 4. Dressing Applied: Santyl Ointment 5. Secondary Dressing Applied Gauze and Kerlix/Conform 7. Secured with Secretary/administrator) Signed: 06/06/2015 2:47:16 PM By: [name] BSN, RN Entered By: [name] on 06/06/2015 13:10:56 [name] ([id number]) -------------------------------------------------------------------------------- Vitals Details Patient Name: [name] Date of Service: 06/06/2015 12:45 PM Medical Record Number: [id number] Patient Account Number: [id number] Date of Birth/Sex: [birth year] (82 y.o. Female) Treating RN: Afful, RN, BSN, Rita Primary Care Physician: [name] Other Clinician: Referring Physician: Loney [name] Treating Physician/Extender: [name] in Treatment: 14 Vital Signs Time Taken: 13:05 Temperature (F): 98.1 Height (in): 64 Pulse (bpm): 80 Respiratory Rate (breaths/min): 18 Blood Pressure (mmHg): 127/71 Reference Range: 80 - 120 mg /  dl Electronic Signature(s) Signed: 06/06/2015 2:47:16 PM By: [name] BSN, RN Entered By: [name] on 06/06/2015 13:07:02

## 2015-06-12 ENCOUNTER — Encounter: Payer: Medicare Other | Attending: Surgery | Admitting: Surgery

## 2015-06-12 DIAGNOSIS — G309 Alzheimer's disease, unspecified: Secondary | ICD-10-CM | POA: Insufficient documentation

## 2015-06-12 DIAGNOSIS — L97422 Non-pressure chronic ulcer of left heel and midfoot with fat layer exposed: Secondary | ICD-10-CM | POA: Diagnosis not present

## 2015-06-12 DIAGNOSIS — L89620 Pressure ulcer of left heel, unstageable: Secondary | ICD-10-CM | POA: Diagnosis not present

## 2015-06-12 DIAGNOSIS — E11621 Type 2 diabetes mellitus with foot ulcer: Secondary | ICD-10-CM | POA: Diagnosis not present

## 2015-06-12 DIAGNOSIS — I10 Essential (primary) hypertension: Secondary | ICD-10-CM | POA: Diagnosis not present

## 2015-06-13 NOTE — Progress Notes (Signed)
SEVILLE, BRICK ([id number]) Visit Report for 06/12/2015 Arrival Information Details Patient Name: [name] Date of Service: 06/12/2015 3:00 PM Medical Record Number: [id number] Patient Account Number: [id number] Date of Birth/Sex: 1928/02/25 (82 y.o. Female) Treating RN: Afful, RN, BSN, [place] Sink Primary Care Physician: Loney Laurence Other Clinician: Referring Physician: Loney Laurence Treating Physician/Extender: Rudene Re in Treatment: 15 Visit Information History Since Last Visit Any new allergies or adverse reactions: No Patient Arrived: Wheel Chair Had a fall or experienced change in No Arrival Time: 14:57 activities of daily living that may affect Accompanied By: caregiver risk of falls: Transfer Assistance: None Signs or symptoms of abuse/neglect since last No Patient Identification Verified: Yes visito Secondary Verification Process Yes Hospitalized since last visit: No Completed: Has Dressing in Place as Prescribed: Yes Patient Requires Transmission- No Pain Present Now: No Based Precautions: Patient Has Alerts: Yes Patient Alerts: Patient on Blood Thinner ABI: Non Compressible Electronic Signature(s) Signed: 06/12/2015 4:11:51 PM By: Elpidio Eric BSN, RN Entered By: Elpidio Eric on 06/12/2015 14:57:36 Schrieber, [name] ([id number]) -------------------------------------------------------------------------------- Encounter Discharge Information Details Patient Name: [name] Date of Service: 06/12/2015 3:00 PM Medical Record Number: [id number] Patient Account Number: [id number] Date of Birth/Sex: 09-18-1927 (82 y.o. Female) Treating RN: Afful, RN, BSN, [place] Sink Primary Care Physician: Loney Laurence Other Clinician: Referring Physician: Loney Laurence Treating Physician/Extender: Rudene Re in Treatment: 15 Encounter Discharge Information Items Discharge Pain Level: 0 Discharge Condition: Stable Ambulatory Status:  Wheelchair Discharge Destination: Nursing Home Transportation: Private Auto Accompanied By: caregiver Schedule Follow-up Appointment: No Medication Reconciliation completed No and provided to Patient/Care Shimika Ames: Provided on Clinical Summary of Care: 06/12/2015 Form Type Recipient Paper Patient BM Electronic Signature(s) Signed: 06/12/2015 3:44:15 PM By: Elpidio Eric BSN, RN Previous Signature: 06/12/2015 3:23:05 PM Version By: Gwenlyn Perking Entered By: Elpidio Eric on 06/12/2015 15:44:15 Vasil, [name] ([id number]) -------------------------------------------------------------------------------- Lower Extremity Assessment Details Patient Name: [name] Date of Service: 06/12/2015 3:00 PM Medical Record Number: [id number] Patient Account Number: [id number] Date of Birth/Sex: 1927/10/12 (82 y.o. Female) Treating RN: Afful, RN, BSN, [place] Sink Primary Care Physician: Loney Laurence Other Clinician: Referring Physician: Loney Laurence Treating Physician/Extender: Rudene Re in Treatment: 15 Vascular Assessment Pulses: Posterior Tibial Dorsalis Pedis Palpable: [Left:Yes] Extremity colors, hair growth, and conditions: Extremity Color: [Left:Normal] Hair Growth on Extremity: [Left:No] Temperature of Extremity: [Left:Warm] Capillary Refill: [Left:< 3 seconds] Toe Nail Assessment Left: Right: Thick: Yes Discolored: No Deformed: No Improper Length and Hygiene: No Electronic Signature(s) Signed: 06/12/2015 4:11:51 PM By: Elpidio Eric BSN, RN Entered By: Elpidio Eric on 06/12/2015 15:01:07 Arceneaux, [name] ([id number]) -------------------------------------------------------------------------------- Multi Wound Chart Details Patient Name: [name] Date of Service: 06/12/2015 3:00 PM Medical Record Number: [id number] Patient Account Number: [id number] Date of Birth/Sex: [birth year] (82 y.o. Female) Treating RN: Clover Mealy, RN, BSN, [place] Sink Primary Care Physician:  Loney Laurence Other Clinician: Referring Physician: Loney Laurence Treating Physician/Extender: Rudene Re in Treatment: 15 Vital Signs Height(in): 64 Pulse(bpm): 84 Weight(lbs): Blood Pressure 137/55 (mmHg): Body Mass Index(BMI): Temperature(F): 98.1 Respiratory Rate 18 (breaths/min): Photos: [1:No Photos] [N/A:N/A] Wound Location: [1:Left Calcaneous] [N/A:N/A] Wounding Event: [1:Pressure Injury] [N/A:N/A] Primary Etiology: [1:Pressure Ulcer] [N/A:N/A] Comorbid History: [1:Anemia, Hypertension, Type II Diabetes, History of pressure wounds, Osteoarthritis, Dementia] [N/A:N/A] Date Acquired: [1:01/30/2015] [N/A:N/A] Weeks of Treatment: [1:15] [N/A:N/A] Wound Status: [1:Open] [N/A:N/A] Measurements L x W x D 2.4x3x0.8 [N/A:N/A] (cm) Area (cm) : [1:5.655] [N/A:N/A] Volume (cm) : [1:4.524] [N/A:N/A] % Reduction in Area: [1:41.20%] [N/A:N/A] % Reduction in Volume: -370.30% [N/A:N/A] Classification: [1:Unstageable/Unclassified] [N/A:N/A] HBO Classification: [1:Grade 1] [N/A:N/A] Exudate  Amount: [1:Large] [N/A:N/A] Exudate Type: [1:Serosanguineous] [N/A:N/A] Exudate Color: [1:red, brown] [N/A:N/A] Wound Margin: [1:Indistinct, nonvisible] [N/A:N/A] Granulation Amount: [1:None Present (0%)] [N/A:N/A] Necrotic Amount: [1:Large (67-100%)] [N/A:N/A] Exposed Structures: [1:Fascia: No Fat: No Tendon: No Muscle: No Joint: No] [N/A:N/A] Bone: No Limited to Skin Breakdown Epithelialization: None N/A N/A Periwound Skin Texture: Edema: No N/A N/A Excoriation: No Induration: No Callus: No Crepitus: No Fluctuance: No Friable: No Rash: No Scarring: No Periwound Skin Moist: Yes N/A N/A Moisture: Maceration: No Dry/Scaly: No Periwound Skin Color: Atrophie Blanche: No N/A N/A Cyanosis: No Ecchymosis: No Erythema: No Hemosiderin Staining: No Mottled: No Pallor: No Rubor: No Temperature: No Abnormality N/A N/A Tenderness on Yes N/A N/A Palpation: Wound  Preparation: Ulcer Cleansing: N/A N/A Rinsed/Irrigated with Saline Topical Anesthetic Applied: Other: lidocaine Treatment Notes Electronic Signature(s) Signed: 06/12/2015 4:11:51 PM By: Elpidio Eric BSN, RN Entered By: Elpidio Eric on 06/12/2015 15:05:01 [name] ([id number]) -------------------------------------------------------------------------------- Multi-Disciplinary Care Plan Details Patient Name: [name] Date of Service: 06/12/2015 3:00 PM Medical Record Number: [id number] Patient Account Number: [id number] Date of Birth/Sex: 1927-10-21 (82 y.o. Female) Treating RN: Afful, RN, BSN, [place] Primary Care Physician: Loney Laurence Other Clinician: Referring Physician: Loney Laurence Treating Physician/Extender: Rudene Re in Treatment: 15 Active Inactive Orientation to the Wound Care Program Nursing Diagnoses: Knowledge deficit related to the wound healing center program Goals: Patient/caregiver will verbalize understanding of the Wound Healing Center Program Date Initiated: 02/27/2015 Goal Status: Active Interventions: Provide education on orientation to the wound center Notes: Pressure Nursing Diagnoses: Knowledge deficit related to management of pressures ulcers Potential for impaired tissue integrity related to pressure, friction, moisture, and shear Goals: Patient will remain free from development of additional pressure ulcers Date Initiated: 02/27/2015 Goal Status: Active Patient will remain free of pressure ulcers Date Initiated: 02/27/2015 Goal Status: Active Patient/caregiver will verbalize risk factors for pressure ulcer development Date Initiated: 02/27/2015 Goal Status: Active Patient/caregiver will verbalize understanding of pressure ulcer management Date Initiated: 02/27/2015 Goal Status: Active Interventions: Assess: immobility, friction, shearing, incontinence upon admission and as needed Somes, Jaquasha  ([id number]) Assess offloading mechanisms upon admission and as needed Assess potential for pressure ulcer upon admission and as needed Provide education on pressure ulcers Treatment Activities: Patient referred for home evaluation of offloading devices/mattresses : 06/12/2015 Patient referred for pressure reduction/relief devices : 06/12/2015 Notes: Wound/Skin Impairment Nursing Diagnoses: Impaired tissue integrity Knowledge deficit related to ulceration/compromised skin integrity Goals: Patient/caregiver will verbalize understanding of skin care regimen Date Initiated: 02/27/2015 Goal Status: Active Ulcer/skin breakdown will have a volume reduction of 30% by week 4 Date Initiated: 02/27/2015 Goal Status: Active Ulcer/skin breakdown will have a volume reduction of 50% by week 8 Date Initiated: 02/27/2015 Goal Status: Active Ulcer/skin breakdown will have a volume reduction of 80% by week 12 Date Initiated: 02/27/2015 Goal Status: Active Ulcer/skin breakdown will heal within 14 weeks Date Initiated: 02/27/2015 Goal Status: Active Interventions: Assess patient/caregiver ability to obtain necessary supplies Assess patient/caregiver ability to perform ulcer/skin care regimen upon admission and as needed Assess ulceration(s) every visit Provide education on ulcer and skin care Treatment Activities: Skin care regimen initiated : 06/12/2015 Notes: Electronic Signature(s) MICHELLE, WNEK ([id number]) Signed: 06/12/2015 4:11:51 PM By: Elpidio Eric BSN, RN Entered By: Elpidio Eric on 06/12/2015 15:04:55 Warda, [name] ([id number]) -------------------------------------------------------------------------------- Pain Assessment Details Patient Name: [name] Date of Service: 06/12/2015 3:00 PM Medical Record Number: [id number] Patient Account Number: [id number] Date of Birth/Sex: December 10, 1927 (82 y.o. Female) Treating RN: Afful, RN, BSN, [place] Sink Primary Care Physician: Orvan Falconer,  MARGARET Other Clinician: Referring Physician: Loney Laurence Treating Physician/Extender: Rudene Re in Treatment: 15 Active Problems Location of Pain Severity and Description of Pain Patient Has Paino No Site Locations Pain Management and Medication Current Pain Management: Electronic Signature(s) Signed: 06/12/2015 4:11:51 PM By: Elpidio Eric BSN, RN Entered By: Elpidio Eric on 06/12/2015 14:57:42 Botz, [name] ([id number]) -------------------------------------------------------------------------------- Patient/Caregiver Education Details Patient Name: [name] Date of Service: 06/12/2015 3:00 PM Medical Record Number: [id number] Patient Account Number: [id number] Date of Birth/Gender: 12/28/1927 (82 y.o. Female) Treating RN: Afful, RN, BSN, [place] Sink Primary Care Physician: Loney Laurence Other Clinician: Referring Physician: Loney Laurence Treating Physician/Extender: Rudene Re in Treatment: 15 Education Assessment Education Provided To: Patient Education Topics Provided Pressure: Methods: Explain/Verbal Responses: State content correctly Welcome To The Wound Care Center: Methods: Explain/Verbal Responses: State content correctly Wound/Skin Impairment: Methods: Explain/Verbal Responses: State content correctly Electronic Signature(s) Signed: 06/12/2015 3:44:32 PM By: Elpidio Eric BSN, RN Entered By: Elpidio Eric on 06/12/2015 15:44:32 Crotteau, Inger ([id number]) -------------------------------------------------------------------------------- Wound Assessment Details Patient Name: [name] Date of Service: 06/12/2015 3:00 PM Medical Record Number: [id number] Patient Account Number: [id number] Date of Birth/Sex: 1927-10-26 (82 y.o. Female) Treating RN: Afful, RN, BSN, [place] Sink Primary Care Physician: Loney Laurence Other Clinician: Referring Physician: Loney Laurence Treating Physician/Extender: Rudene Re in  Treatment: 15 Wound Status Wound Number: 1 Primary Pressure Ulcer Etiology: Wound Location: Left Calcaneous Wound Open Wounding Event: Pressure Injury Status: Date Acquired: 01/30/2015 Comorbid Anemia, Hypertension, Type II Weeks Of Treatment: 15 History: Diabetes, History of pressure wounds, Clustered Wound: No Osteoarthritis, Dementia Photos Photo Uploaded By: Elpidio Eric on 06/12/2015 15:47:58 Wound Measurements Length: (cm) 2.4 Width: (cm) 3 Depth: (cm) 0.8 Area: (cm) 5.655 Volume: (cm) 4.524 % Reduction in Area: 41.2% % Reduction in Volume: -370.3% Epithelialization: None Tunneling: No Undermining: No Wound Description Classification: Unstageable/Unclassified Foul Od Diabetic Severity (Wagner): Grade 1 Wound Margin: Indistinct, nonvisible Exudate Amount: Large Exudate Type: Serosanguineous Exudate Color: red, brown or After Cleansing: No Wound Bed Granulation Amount: None Present (0%) Exposed Structure Necrotic Amount: Large (67-100%) Fascia Exposed: No Necrotic Quality: Adherent Slough Fat Layer Exposed: No Ruz, Gizella ([id number]) Tendon Exposed: No Muscle Exposed: No Joint Exposed: No Bone Exposed: No Limited to Skin Breakdown Periwound Skin Texture Texture Color No Abnormalities Noted: No No Abnormalities Noted: No Callus: No Atrophie Blanche: No Crepitus: No Cyanosis: No Excoriation: No Ecchymosis: No Fluctuance: No Erythema: No Friable: No Hemosiderin Staining: No Induration: No Mottled: No Localized Edema: No Pallor: No Rash: No Rubor: No Scarring: No Temperature / Pain Moisture Temperature: No Abnormality No Abnormalities Noted: No Tenderness on Palpation: Yes Dry / Scaly: No Maceration: No Moist: Yes Wound Preparation Ulcer Cleansing: Rinsed/Irrigated with Saline Topical Anesthetic Applied: Other: lidocaine, Treatment Notes Wound #1 (Left Calcaneous) 1. Cleansed with: Clean wound with Normal Saline 4. Dressing  Applied: Santyl Ointment 5. Secondary Dressing Applied Gauze and Kerlix/Conform 7. Secured with Secretary/administrator) Signed: 06/12/2015 4:11:51 PM By: Elpidio Eric BSN, RN Entered By: Elpidio Eric on 06/12/2015 15:04:44 [name] ([id number]) -------------------------------------------------------------------------------- Vitals Details Patient Name: [name] Date of Service: 06/12/2015 3:00 PM Medical Record Number: [id number] Patient Account Number: [id number] Date of Birth/Sex: May 02, 1928 (82 y.o. Female) Treating RN: Afful, RN, BSN, [place] Primary Care Physician: Loney Laurence Other Clinician: Referring Physician: Loney Laurence Treating Physician/Extender: Rudene Re in Treatment: 15 Vital Signs Time Taken: 15:00 Temperature (F): 98.1 Height (in): 64 Pulse (bpm): 84 Respiratory Rate (breaths/min): 18 Blood Pressure (mmHg): 137/55 Reference Range: 80 - 120 mg /  dl Electronic Signature(s) Signed: 06/12/2015 4:11:51 PM By: Elpidio Eric BSN, RN Entered By: Elpidio Eric on 06/12/2015 15:00:47

## 2015-06-13 NOTE — Progress Notes (Signed)
[name] ([id number]) Visit Report for 06/12/2015 Chief Complaint Document Details Patient Name: [name], [name] Date of Service: 06/12/2015 3:00 PM Medical Record Patient Account Number: [id number] Number: Afful, RN, BSN, Treating RN: 1928-01-19 (82 y.o. [place] [name] Date of Birth/Sex: Female) Other Clinician: Primary Care Physician: CAMPBELL, MARGARET Treating Anil Havard Referring Physician: Loney Laurence Physician/Extender: Weeks in Treatment: 15 Information Obtained from: Patient Chief Complaint patient is back to see [place] today for diabetic foot ulcer which she's had for several months. Electronic Signature(s) Signed: 06/12/2015 3:23:27 PM By: Evlyn Kanner MD, FACS Entered By: Evlyn Kanner on 06/12/2015 15:23:27 [name] ([id number]) -------------------------------------------------------------------------------- Debridement Details Patient Name: [name] Date of Service: 06/12/2015 3:00 PM Medical Record Patient Account Number: [id number] Number: Afful, RN, BSN, Treating RN: 11-Jun-1927 (82 y.o. [place] [name] Date of Birth/Sex: Female) Other Clinician: Primary Care Physician: CAMPBELL, MARGARET Treating Delona Clasby Referring Physician: Loney Laurence Physician/Extender: Weeks in Treatment: 15 Debridement Performed for Wound #1 Left Calcaneous Assessment: Performed By: Physician Evlyn Kanner, MD Debridement: Debridement Pre-procedure Yes Verification/Time Out Taken: Start Time: 15:12 Pain Control: Lidocaine 4% Topical Solution Level: Skin/Subcutaneous Tissue Total Area Debrided (L x 2.4 (cm) x 3 (cm) = 7.2 (cm) W): Tissue and other Non-Viable, Exudate, Fibrin/Slough, Subcutaneous material debrided: Instrument: Curette Bleeding: Minimum Hemostasis Achieved: Pressure End Time: 15:17 Procedural Pain: 0 Post Procedural Pain: 0 Response to Treatment: Procedure was tolerated well Post Debridement Measurements of Total  Wound Length: (cm) 2.4 Stage: Unstageable/Unclassified Width: (cm) 3 Depth: (cm) 0.8 Volume: (cm) 4.524 Post Procedure Diagnosis Same as Pre-procedure Electronic Signature(s) Signed: 06/12/2015 3:23:20 PM By: Evlyn Kanner MD, FACS Signed: 06/12/2015 4:11:51 PM By: Elpidio Eric BSN, RN Entered By: Evlyn Kanner on 06/12/2015 15:23:20 [name] ([id number]) -------------------------------------------------------------------------------- HPI Details Patient Name: [name] Date of Service: 06/12/2015 3:00 PM Medical Record Patient Account Number: [id number] Number: Afful, RN, BSN, Treating RN: 14-Feb-1928 (82 y.o. [place] [name] Date of Birth/Sex: Female) Other Clinician: Primary Care Physician: CAMPBELL, MARGARET Treating Sophiya Morello Referring Physician: Loney Laurence Physician/Extender: Weeks in Treatment: 15 History of Present Illness Location: left heel and dorsum of the foot Quality: Patient reports No Pain. Severity: Patient states wound (s) are getting better. Duration: the patient has had surgery in early December and had vascular testing ordered but not done. Timing: Pain in wound is Intermittent (comes and goes Context: The wound appeared gradually over time Modifying Factors: Other treatment(s) tried include: local dressings and offloading. surgery done by Dr. Graciela Husbands for debridement of the left heel wound Associated Signs and Symptoms: Patient reports having difficulty standing for long periods. HPI Description: 82 year old patient with past medical history of Alzheimer's dementia, diabetes mellitus, hypertension and recent history of right hip arthroplasty in August 2016. Her past medical history is significant also for hypothyroidism, hypertension, urinary retention, dementia. 04/11/2015 -- she has had a lot of pain and left heel and there has been a lot of drainage today with purulent material. He has not been running fever. 04/18/2015 -- last week  she was here with a florid infection of her left heel and I had sent her to the hospital for admission. She was admitted between 12/ 2 and 12/ 5 and she underwent a debridement of the left heel abscess with placement of antibiotic beads with vancomycin, by Dr. Linus Galas. Her cultures grew gram- negative bacteria, was given vancomycin and Unasyn IV in hospital and she was continued on oral antibiotics and was given documented for 10 days. at the time of discharge Dr. Alberteen Spindle wanted the  dressing to be intact for about a week before it would need changing. 05/29/2014 -- she has not been to see [place] for the last 6 weeks and from what I understand she has been in rehabilitation facility. No vascular testing was done as we had requested. She has a diabetic foot ulcer which was graded as a Wagner 3 when she was seen last about a month and a half ago. There is no family member to discuss hyperbaric oxygen therapy and the patient has as Alzheimer's dementia and I do not believe she understands our discussions 06/12/2015 -- a vascular appointment is pending for tomorrow Electronic Signature(s) Signed: 06/12/2015 3:23:50 PM By: Evlyn Kanner MD, FACS Entered By: Evlyn Kanner on 06/12/2015 15:23:49 Banet, Luberta [name] ([id number]) -------------------------------------------------------------------------------- Physical Exam Details Patient Name: [name] Date of Service: 06/12/2015 3:00 PM Medical Record Patient Account Number: [id number] Number: Afful, RN, BSN, Treating RN: October 21, 1927 (82 y.o. [name] Date of Birth/Sex: Female) Other Clinician: Primary Care Physician: CAMPBELL, MARGARET Treating Alysiana Ethridge Referring Physician: Loney Laurence Physician/Extender: Weeks in Treatment: 15 Constitutional . Pulse regular. Respirations normal and unlabored. Afebrile. . Eyes Nonicteric. Reactive to light. Ears, Nose, Mouth, and Throat Lips, teeth, and gums WNL.Marland Kitchen Moist mucosa without  lesions. Neck supple and nontender. No palpable supraclavicular or cervical adenopathy. Normal sized without goiter. Respiratory WNL. No retractions.. Cardiovascular Pedal Pulses WNL. No clubbing, cyanosis or edema. Lymphatic No adneopathy. No adenopathy. No adenopathy. Musculoskeletal Adexa without tenderness or enlargement.. Digits and nails w/o clubbing, cyanosis, infection, petechiae, ischemia, or inflammatory conditions.. Integumentary (Hair, Skin) No suspicious lesions. No crepitus or fluctuance. No peri-wound warmth or erythema. No masses.Marland Kitchen Psychiatric Judgement and insight Intact.. No evidence of depression, anxiety, or agitation.. Notes the left heel has significant amount of eschar and debris down to the subcutis tissue and sharp debridement was done with a curette and hemostasis was achieved with pressure Electronic Signature(s) Signed: 06/12/2015 3:24:18 PM By: Evlyn Kanner MD, FACS Entered By: Evlyn Kanner on 06/12/2015 15:24:18 [name] ([id number]) -------------------------------------------------------------------------------- Physician Orders Details Patient Name: [name] Date of Service: 06/12/2015 3:00 PM Medical Record Patient Account Number: [id number] Number: Afful, RN, BSN, Treating RN: Jan 14, 1928 (82 y.o. [place] [name] Date of Birth/Sex: Female) Other Clinician: Primary Care Physician: CAMPBELL, MARGARET Treating Eland Lamantia Referring Physician: Loney Laurence Physician/Extender: Tania Ade in Treatment: 15 Verbal / Phone Orders: Yes Clinician: Afful, RN, BSN, Rita Read Back and Verified: Yes Diagnosis Coding Wound Cleansing Wound #1 Left Calcaneous o Clean wound with Normal Saline. Primary Wound Dressing Wound #1 Left Calcaneous o Santyl Ointment Secondary Dressing Wound #1 Left Calcaneous o Gauze and Kerlix/Conform Dressing Change Frequency Wound #1 Left Calcaneous o Change dressing every other day. - for home  health purposes Follow-up Appointments Wound #1 Left Calcaneous o Return Appointment in 1 week. Off-Loading Wound #1 Left Calcaneous o Heel suspension boot to: - sage boots Home Health Wound #1 Left Calcaneous o Continue Home Health Visits - [facility] o Home Health Nurse may visit PRN to address patientos wound care needs. o FACE TO FACE ENCOUNTER: MEDICARE and MEDICAID PATIENTS: I certify that this patient is under my care and that I had a face-to-face encounter that meets the physician face-to-face encounter requirements with this patient on this date. The encounter with the patient was in whole or in part for the following MEDICAL CONDITION: (primary reason for Home Healthcare) MEDICAL NECESSITY: I certify, that based on my findings, NURSING services are a medically Slee, Deirdra ([id number]) necessary home health service. HOME BOUND STATUS:  I certify that my clinical findings support that this patient is homebound (i.e., Due to illness or injury, pt requires aid of supportive devices such as crutches, cane, wheelchairs, walkers, the use of special transportation or the assistance of another person to leave their place of residence. There is a normal inability to leave the home and doing so requires considerable and taxing effort. Other absences are for medical reasons / religious services and are infrequent or of short duration when for other reasons). o If current dressing causes regression in wound condition, may D/C ordered dressing product/s and apply Normal Saline Moist Dressing daily until next Wound Healing Center / Other MD appointment. Notify Wound Healing Center of regression in wound condition at [phone]. o Please direct any NON-WOUND related issues/requests for orders to patient's Primary Care Physician Electronic Signature(s) Signed: 06/12/2015 3:59:20 PM By: Evlyn Kanner MD, FACS Signed: 06/12/2015 4:11:51 PM By: Elpidio Eric BSN, RN Entered By:  Elpidio Eric on 06/12/2015 15:21:52 Deyo, Luberta [name] ([id number]) -------------------------------------------------------------------------------- Problem List Details Patient Name: [name] Date of Service: 06/12/2015 3:00 PM Medical Record Patient Account Number: [id number] Number: Afful, RN, BSN, Treating RN: 08/30/1927 (82 y.o. 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 Inactive Problems Resolved Problems ICD-10 Code Description Active Date Resolved Date L89.521 Pressure ulcer of left ankle, stage 1 02/27/2015 02/27/2015 L03.116 Cellulitis of left lower limb 04/11/2015 04/11/2015 Electronic Signature(s) Signed: 06/12/2015 3:23:11 PM By: Evlyn Kanner MD, FACS Entered By: Evlyn Kanner on 06/12/2015 15:23:11 [name], Luberta [name] ([id number]) -------------------------------------------------------------------------------- Progress Note Details Patient Name: [name] Date of Service: 06/12/2015 3:00 PM Medical Record Patient Account Number: [id number] Number: Afful, RN, BSN, Treating RN: 11-21-1927 (82 y.o.  [name] Date of Birth/Sex: Female) Other Clinician: Primary Care Physician: CAMPBELL, MARGARET Treating Deitrich Steve Referring Physician: Loney Laurence Physician/Extender: Weeks in Treatment: 15 Subjective Chief Complaint Information obtained from Patient patient is back to see [place] today for diabetic foot ulcer which she's had for  several months. History of Present Illness (HPI) The following HPI elements were documented for the patient's wound: Location: left heel and dorsum of the foot Quality: Patient reports No Pain. Severity: Patient states wound (s) are getting better. Duration: the patient has had surgery in early December and had vascular testing ordered but not done. Timing: Pain in wound is Intermittent (comes and goes Context: The wound appeared gradually over time Modifying Factors: Other treatment(s) tried include: local dressings and offloading. surgery done by Dr. Graciela Husbands for debridement of the left heel wound Associated Signs and Symptoms: Patient reports having difficulty standing for long periods. 82 year old patient with past medical history of Alzheimer's dementia, diabetes mellitus, hypertension and recent history of right hip arthroplasty in August 2016. Her past medical history is significant also for hypothyroidism, hypertension, urinary retention, dementia. 04/11/2015 -- she has had a lot of pain and left heel and there has been a lot of drainage today with purulent material. He has not been running fever. 04/18/2015 -- last week she was here with a florid infection of her left heel and I had sent her to the hospital for admission. She was admitted between 12/  2 and 12/ 5 and she underwent a debridement of the left heel abscess with placement of antibiotic beads with vancomycin, by Dr. Linus Galas. Her cultures grew gram- negative bacteria, was given vancomycin and Unasyn IV in hospital and she was continued on oral antibiotics and was given documented for 10 days. at the time of discharge Dr. Alberteen Spindle wanted the dressing to be intact for about a week before it would need changing. 05/29/2014 -- she has not been to see [place] for the last 6 weeks and from what I understand she has been in rehabilitation facility. No vascular testing was done as we had requested. She has a diabetic foot ulcer which was  graded as a Wagner 3 when she was seen last about a month and a half ago. There is no family member to discuss hyperbaric oxygen therapy and the patient has as Alzheimer's dementia and I do not believe she understands our discussions 06/12/2015 -- a vascular appointment is pending for tomorrow LINNET, BOTTARI ([id number]) Objective Constitutional Pulse regular. Respirations normal and unlabored. Afebrile. Vitals Time Taken: 3:00 PM, Height: 64 in, Temperature: 98.1 F, Pulse: 84 bpm, Respiratory Rate: 18 breaths/min, Blood Pressure: 137/55 mmHg. Eyes Nonicteric. Reactive to light. Ears, Nose, Mouth, and Throat Lips, teeth, and gums WNL.Marland Kitchen Moist mucosa without lesions. Neck supple and nontender. No palpable supraclavicular or cervical adenopathy. Normal sized without goiter. Respiratory WNL. No retractions.. Cardiovascular Pedal Pulses WNL. No clubbing, cyanosis or edema. Lymphatic No adneopathy. No adenopathy. No adenopathy. Musculoskeletal Adexa without tenderness or enlargement.. Digits and nails w/o clubbing, cyanosis, infection, petechiae, ischemia, or inflammatory conditions.Marland Kitchen Psychiatric Judgement and insight Intact.. No evidence of depression, anxiety, or agitation.. General Notes: the left heel has significant amount of eschar and debris down to the subcutis tissue and sharp debridement was done with a curette and hemostasis was achieved with pressure Integumentary (Hair, Skin) No suspicious lesions. No crepitus or fluctuance. No peri-wound warmth or erythema. No masses.. Wound #1 status is Open. Original cause of wound was Pressure Injury. The wound is located on the Left Calcaneous. The wound measures 2.4cm length x 3cm width x 0.8cm depth; 5.655cm^2 area and 4.524cm^3 volume. The wound is limited to skin breakdown. There is no tunneling or undermining noted. Westerfeld, Lorre ([id number]) There is a large amount of serosanguineous drainage noted. The wound margin is  indistinct and nonvisible. There is no granulation within the wound bed. There is a large (67-100%) amount of necrotic tissue within the wound bed including Adherent Slough. The periwound skin appearance exhibited: Moist. The periwound skin appearance did not exhibit: Callus, Crepitus, Excoriation, Fluctuance, Friable, Induration, Localized Edema, Rash, Scarring, Dry/Scaly, Maceration, Atrophie Blanche, Cyanosis, Ecchymosis, Hemosiderin Staining, Mottled, Pallor, Rubor, Erythema. Periwound temperature was noted as No Abnormality. The periwound has tenderness on palpation. 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 There was a Skin/Subcutaneous Tissue Debridement ([id number]) debridement with total area of 7.2 sq cm performed by Evlyn Kanner, MD. with the following instrument(s): Curette to remove Non-Viable tissue/material including Exudate, Fibrin/Slough, and Subcutaneous after achieving pain control using Lidocaine 4% Topical Solution. A time out was conducted prior to the start of the procedure. A Minimum amount of bleeding was controlled with Pressure. The procedure was tolerated  well with a pain level of 0 throughout and a pain level of 0 following the procedure. Post Debridement Measurements: 2.4cm length x 3cm width x 0.8cm depth; 4.524cm^3 volume. Post debridement Stage noted as Unstageable/Unclassified. Post procedure Diagnosis Wound #1: Same as Pre-Procedure Plan Wound Cleansing: Wound #1 Left Calcaneous: Clean wound with Normal Saline. Acoff, Joyceann ([id number]) Primary Wound Dressing: Wound #1 Left Calcaneous: Santyl Ointment Secondary Dressing: Wound #1 Left  Calcaneous: Gauze and Kerlix/Conform Dressing Change Frequency: Wound #1 Left Calcaneous: Change dressing every other day. - for home health purposes Follow-up Appointments: Wound #1 Left Calcaneous: Return Appointment in 1 week. Off-Loading: Wound #1 Left Calcaneous: Heel suspension boot to: - sage boots Home Health: Wound #1 Left Calcaneous: Continue Home Health Visits - [facility] Home Health Nurse may visit PRN to address patient s wound care needs. FACE TO FACE ENCOUNTER: MEDICARE and MEDICAID PATIENTS: I certify that this patient is under my care and that I had a face-to-face encounter that meets the physician face-to-face encounter requirements with this patient on this date. The encounter with the patient was in whole or in part for the following MEDICAL CONDITION: (primary reason for Home Healthcare) MEDICAL NECESSITY: I certify, that based on my findings, NURSING services are a medically necessary home health service. HOME BOUND STATUS: I certify that my clinical findings support that this patient is homebound (i.e., Due to illness or injury, pt requires aid of supportive devices such as crutches, cane, wheelchairs, walkers, the use of special transportation or the assistance of another person to leave their place of residence. There is a normal inability to leave the home and doing so requires considerable and taxing effort. Other absences are for medical reasons / religious services and are infrequent or of short duration when for other reasons). If current dressing causes regression in wound condition, may D/C ordered dressing product/s and apply Normal Saline Moist Dressing daily until next Wound Healing Center / Other MD appointment. Notify Wound Healing Center of regression in wound condition at [phone]. Please direct any NON-WOUND related issues/requests for orders to patient's Primary Care Physician I have recommended Santyl ointment locally, off loading and  appropriate nutritional support with evaluation of her recent diabetic control. She will come back and see as next week. Electronic Signature(s) Signed: 06/12/2015 3:24:53 PM By: Evlyn Kanner MD, FACS Entered By: Evlyn Kanner on 06/12/2015 15:24:53 Zingg, Didi ([id number]) RHEDA, KASSAB ([id number]) -------------------------------------------------------------------------------- SuperBill Details Patient Name: [name] Date of Service: 06/12/2015 Medical Record Patient Account Number: [id number] Number: Afful, RN, BSN, Treating RN: 1927/09/25 (82 y.o. [place] [name] Date of Birth/Sex: Female) Other Clinician: Primary Care Physician: CAMPBELL, MARGARET Treating Heath Tesler Referring Physician: Loney Laurence Physician/Extender: Weeks in Treatment: 15 Diagnosis Coding ICD-10 Codes Code Description E11.621 Type 2 diabetes mellitus with foot ulcer L89.620 Pressure ulcer of left heel, unstageable G30.9 Alzheimer's disease, unspecified L97.422 Non-pressure chronic ulcer of left heel and midfoot with fat layer exposed Facility Procedures CPT4 Code Description: [id number] 11042 - DEB SUBQ TISSUE 20 SQ CM/< ICD-10 Description Diagnosis E11.621 Type 2 diabetes mellitus with foot ulcer L89.620 Pressure ulcer of left heel, unstageable G30.9 Alzheimer's disease, unspecified L97.422 Non-pressure  chronic ulcer of left heel and midfoot Modifier: with fat laye Quantity: 1 r exposed Physician Procedures CPT4 Code Description: [id number] 11042 - WC PHYS SUBQ TISS 20 SQ CM ICD-10 Description Diagnosis E11.621 Type 2 diabetes mellitus with foot ulcer L89.620 Pressure ulcer of left heel, unstageable G30.9 Alzheimer's disease, unspecified L97.422 Non-pressure  chronic ulcer of left heel  and midfoot Modifier: with fat layer Quantity: 1 exposed Electronic Signature(s) Signed: 06/12/2015 3:25:06 PM By: Evlyn Kanner MD, FACS Entered By: Evlyn Kanner on 06/12/2015 15:25:06

## 2015-06-19 ENCOUNTER — Encounter: Payer: Medicare Other | Admitting: Surgery

## 2015-06-19 DIAGNOSIS — E11621 Type 2 diabetes mellitus with foot ulcer: Secondary | ICD-10-CM | POA: Diagnosis not present

## 2015-06-20 NOTE — Progress Notes (Signed)
MICCA, MATURA ([id number]) Visit Report for 06/19/2015 Arrival Information Details Patient Name: [name] Date of Service: 06/19/2015 8:00 AM Medical Record Number: [id number] Patient Account Number: [id number] Date of Birth/Sex: [birth year] (82 y.o. Female) Treating RN: Ashok Cordia, Debi Primary Care Physician: Loney Laurence Other Clinician: Referring Physician: Loney Laurence Treating Physician/Extender: Rudene Re in Treatment: 16 Visit Information History Since Last Visit All ordered tests and consults were completed: No Patient Arrived: Wheel Chair Added or deleted any medications: No Arrival Time: 08:07 Any new allergies or adverse reactions: No Accompanied By: caregiver Had a fall or experienced change in No Transfer Assistance: EasyPivot Patient activities of daily living that may affect Lift risk of falls: Patient Identification Verified: Yes Signs or symptoms of abuse/neglect since last No Secondary Verification Process Yes visito Completed: Hospitalized since last visit: No Patient Requires Transmission- No Pain Present Now: No Based Precautions: Patient Has Alerts: Yes Patient Alerts: Patient on Blood Thinner ABI: Non Compressible Electronic Signature(s) Signed: 06/19/2015 5:51:12 PM By: Alejandro Mulling Entered By: Alejandro Mulling on 06/19/2015 08:08:00 [name] ([id number]) -------------------------------------------------------------------------------- Encounter Discharge Information Details Patient Name: [name] Date of Service: 06/19/2015 8:00 AM Medical Record Number: [id number] Patient Account Number: [id number] Date of Birth/Sex: 26-Aug-1927 (82 y.o. Female) Treating RN: Ashok Cordia, Debi Primary Care Physician: Loney Laurence Other Clinician: Referring Physician: Loney Laurence Treating Physician/Extender: Rudene Re in Treatment: 16 Encounter Discharge Information Items Discharge Pain Level:  0 Discharge Condition: Stable Ambulatory Status: Wheelchair Discharge Destination: Home Transportation: Private Auto Accompanied By: CG Schedule Follow-up Appointment: Yes Medication Reconciliation completed No and provided to Patient/Care Mamoru Takeshita: Provided on Clinical Summary of Care: 06/19/2015 Form Type Recipient Paper Patient BM Electronic Signature(s) Signed: 06/19/2015 8:55:05 AM By: Gwenlyn Perking Entered By: Gwenlyn Perking on 06/19/2015 08:55:05 [name] ([id number]) -------------------------------------------------------------------------------- Lower Extremity Assessment Details Patient Name: [name] Date of Service: 06/19/2015 8:00 AM Medical Record Number: [id number] Patient Account Number: [id number] Date of Birth/Sex: 1927-05-22 (82 y.o. Female) Treating RN: Ashok Cordia, Debi Primary Care Physician: Loney Laurence Other Clinician: Referring Physician: Loney Laurence Treating Physician/Extender: Rudene Re in Treatment: 16 Vascular Assessment Pulses: Posterior Tibial Dorsalis Pedis Palpable: [Left:Yes] Extremity colors, hair growth, and conditions: Extremity Color: [Left:Normal] Temperature of Extremity: [Left:Warm] Capillary Refill: [Left:< 3 seconds] Toe Nail Assessment Left: Right: Thick: Yes Discolored: No Deformed: No Improper Length and Hygiene: No Electronic Signature(s) Signed: 06/19/2015 5:51:12 PM By: Alejandro Mulling Entered By: Alejandro Mulling on 06/19/2015 08:11:30 [name] ([id number]) -------------------------------------------------------------------------------- Multi Wound Chart Details Patient Name: [name] Date of Service: 06/19/2015 8:00 AM Medical Record Number: [id number] Patient Account Number: [id number] Date of Birth/Sex: 06-27-1927 (82 y.o. Female) Treating RN: Ashok Cordia, Debi Primary Care Physician: Loney Laurence Other Clinician: Referring Physician: Loney Laurence Treating Physician/Extender: Rudene Re in Treatment: 16 Vital Signs Height(in): 64 Pulse(bpm): 87 Weight(lbs): Blood Pressure 111/95 (mmHg): Body Mass Index(BMI): Temperature(F): 98.0 Respiratory Rate 18 (breaths/min): Photos: [1:No Photos] [3:No Photos] [N/A:N/A] Wound Location: [1:Left Calcaneous] [3:Foot - Dorsal] [N/A:N/A] Wounding Event: [1:Pressure Injury] [3:Gradually Appeared] [N/A:N/A] Primary Etiology: [1:Pressure Ulcer] [3:Trauma, Other] [N/A:N/A] Comorbid History: [1:Anemia, Hypertension, Type II Diabetes, History of pressure wounds, Osteoarthritis, Dementia] [3:Anemia, Hypertension, Type II Diabetes, History of pressure wounds, Osteoarthritis, Dementia] [N/A:N/A] Date Acquired: [1:01/30/2015] [3:06/19/2015] [N/A:N/A] Weeks of Treatment: [1:16] [3:0] [N/A:N/A] Wound Status: [1:Open] [3:Open] [N/A:N/A] Measurements L x W x D 2.8x3x0.8 [3:0.4x0.4x0.1] [N/A:N/A] (cm) Area (cm) : [1:6.597] [3:0.126] [N/A:N/A] Volume (cm) : [1:5.278] [3:0.013] [N/A:N/A] % Reduction in Area: [1:31.40%] [3:N/A] [N/A:N/A] % Reduction in Volume: -448.60% [3:N/A] [N/A:N/A] Classification: [  1:Unstageable/Unclassified] [3:Partial Thickness] [N/A:N/A] HBO Classification: [1:Grade 1] [3:Grade 1] [N/A:N/A] Exudate Amount: [1:Large] [3:Large] [N/A:N/A] Exudate Type: [1:Serous] [3:Serous] [N/A:N/A] Exudate Color: [1:amber] [3:amber] [N/A:N/A] Wound Margin: [1:Indistinct, nonvisible] [3:Flat and Intact] [N/A:N/A] Granulation Amount: [1:None Present (0%)] [3:None Present (0%)] [N/A:N/A] Necrotic Amount: [1:Large (67-100%)] [3:Large (67-100%)] [N/A:N/A] Exposed Structures: [1:Fascia: No Fat: No Tendon: No Muscle: No Joint: No] [3:Fascia: No Fat: No Tendon: No Muscle: No Joint: No] [N/A:N/A] Bone: No Bone: No Limited to Skin Limited to Skin Breakdown Breakdown Epithelialization: None None N/A Periwound Skin Texture: Edema: No No Abnormalities Noted N/A Excoriation:  No Induration: No Callus: No Crepitus: No Fluctuance: No Friable: No Rash: No Scarring: No Periwound Skin Moist: Yes Moist: Yes N/A Moisture: Maceration: No Dry/Scaly: No Periwound Skin Color: Atrophie Blanche: No No Abnormalities Noted N/A Cyanosis: No Ecchymosis: No Erythema: No Hemosiderin Staining: No Mottled: No Pallor: No Rubor: No Temperature: No Abnormality N/A N/A Tenderness on Yes No N/A Palpation: Wound Preparation: Ulcer Cleansing: Ulcer Cleansing: N/A Rinsed/Irrigated with Rinsed/Irrigated with Saline Saline Topical Anesthetic Topical Anesthetic Applied: Other: lidocaine Applied: Other: lidocaine 4% Treatment Notes Electronic Signature(s) Signed: 06/19/2015 5:51:12 PM By: Alejandro Mulling Entered By: Alejandro Mulling on 06/19/2015 08:22:18 GARGI, BERCH ([id number]) -------------------------------------------------------------------------------- Multi-Disciplinary Care Plan Details Patient Name: [name] Date of Service: 06/19/2015 8:00 AM Medical Record Number: [id number] Patient Account Number: [id number] Date of Birth/Sex: [birth year] (82 y.o. Female) Treating RN: Ashok Cordia, Debi Primary Care Physician: Loney Laurence Other Clinician: Referring Physician: Loney Laurence Treating Physician/Extender: Rudene Re in Treatment: 16 Active Inactive Orientation to the Wound Care Program Nursing Diagnoses: Knowledge deficit related to the wound healing center program Goals: Patient/caregiver will verbalize understanding of the Wound Healing Center Program Date Initiated: 02/27/2015 Goal Status: Active Interventions: Provide education on orientation to the wound center Notes: Pressure Nursing Diagnoses: Knowledge deficit related to management of pressures ulcers Potential for impaired tissue integrity related to pressure, friction, moisture, and shear Goals: Patient will remain free from development of additional pressure  ulcers Date Initiated: 02/27/2015 Goal Status: Active Patient will remain free of pressure ulcers Date Initiated: 02/27/2015 Goal Status: Active Patient/caregiver will verbalize risk factors for pressure ulcer development Date Initiated: 02/27/2015 Goal Status: Active Patient/caregiver will verbalize understanding of pressure ulcer management Date Initiated: 02/27/2015 Goal Status: Active Interventions: Assess: immobility, friction, shearing, incontinence upon admission and as needed Bogue, Tationna ([id number]) Assess offloading mechanisms upon admission and as needed Assess potential for pressure ulcer upon admission and as needed Provide education on pressure ulcers Treatment Activities: Patient referred for home evaluation of offloading devices/mattresses : 06/19/2015 Patient referred for pressure reduction/relief devices : 06/19/2015 Notes: Wound/Skin Impairment Nursing Diagnoses: Impaired tissue integrity Knowledge deficit related to ulceration/compromised skin integrity Goals: Patient/caregiver will verbalize understanding of skin care regimen Date Initiated: 02/27/2015 Goal Status: Active Ulcer/skin breakdown will have a volume reduction of 30% by week 4 Date Initiated: 02/27/2015 Goal Status: Active Ulcer/skin breakdown will have a volume reduction of 50% by week 8 Date Initiated: 02/27/2015 Goal Status: Active Ulcer/skin breakdown will have a volume reduction of 80% by week 12 Date Initiated: 02/27/2015 Goal Status: Active Ulcer/skin breakdown will heal within 14 weeks Date Initiated: 02/27/2015 Goal Status: Active Interventions: Assess patient/caregiver ability to obtain necessary supplies Assess patient/caregiver ability to perform ulcer/skin care regimen upon admission and as needed Assess ulceration(s) every visit Provide education on ulcer and skin care Treatment Activities: Skin care regimen initiated : 06/19/2015 Notes: Electronic Signature(s) SHERMAINE, RIVET ([id number]) Signed: 06/19/2015 5:51:12 PM By: Alejandro Mulling Entered By:  Alejandro Mulling on 06/19/2015 08:22:11 NAINIKA, NEWLUN ([id number]) -------------------------------------------------------------------------------- Pain Assessment Details Patient Name: ASIYAH, PINEAU Date of Service: 06/19/2015 8:00 AM Medical Record Number: [id number] Patient Account Number: [id number] Date of Birth/Sex: Sep 22, 1927 (82 y.o. Female) Treating RN: Ashok Cordia, Debi Primary Care Physician: Loney Laurence Other Clinician: Referring Physician: Loney Laurence Treating Physician/Extender: Rudene Re in Treatment: 16 Active Problems Location of Pain Severity and Description of Pain Patient Has Paino No Site Locations Pain Management and Medication Current Pain Management: Electronic Signature(s) Signed: 06/19/2015 5:51:12 PM By: Alejandro Mulling Entered By: Alejandro Mulling on 06/19/2015 08:08:06 Bowne, Luberta [name] ([id number]) -------------------------------------------------------------------------------- Patient/Caregiver Education Details Patient Name: [name] Date of Service: 06/19/2015 8:00 AM Medical Record Number: [id number] Patient Account Number: [id number] Date of Birth/Gender: 03/02/1928 (82 y.o. Female) Treating RN: Ashok Cordia, Debi Primary Care Physician: Loney Laurence Other Clinician: Referring Physician: Loney Laurence Treating Physician/Extender: Rudene Re in Treatment: 16 Education Assessment Education Provided To: Patient and Caregiver Education Topics Provided Offloading: Handouts: Other: DARCO WITH PEG ASSIST Methods: Demonstration, Explain/Verbal Responses: State content correctly Wound/Skin Impairment: Handouts: Other: WOUND CARE AS ORDERED Methods: Demonstration, Explain/Verbal Responses: State content correctly Electronic Signature(s) Signed: 06/19/2015 5:51:12 PM By: Alejandro Mulling Entered By: Alejandro Mulling  on 06/19/2015 08:43:18 Avalos, Erandy ([id number]) -------------------------------------------------------------------------------- Wound Assessment Details Patient Name: [name] Date of Service: 06/19/2015 8:00 AM Medical Record Number: [id number] Patient Account Number: [id number] Date of Birth/Sex: 06/28/1927 (82 y.o. Female) Treating RN: Ashok Cordia, Debi Primary Care Physician: Loney Laurence Other Clinician: Referring Physician: Loney Laurence Treating Physician/Extender: Rudene Re in Treatment: 16 Wound Status Wound Number: 1 Primary Pressure Ulcer Etiology: Wound Location: Left Calcaneous Wound Open Wounding Event: Pressure Injury Status: Date Acquired: 01/30/2015 Comorbid Anemia, Hypertension, Type II Weeks Of Treatment: 16 History: Diabetes, History of pressure wounds, Clustered Wound: No Osteoarthritis, Dementia Photos Photo Uploaded By: Alejandro Mulling on 06/19/2015 17:25:23 Wound Measurements Length: (cm) 2.8 Width: (cm) 3 Depth: (cm) 0.8 Area: (cm) 6.597 Volume: (cm) 5.278 % Reduction in Area: 31.4% % Reduction in Volume: -448.6% Epithelialization: None Tunneling: No Undermining: No Wound Description Classification: Unstageable/Unclassified Foul Od Diabetic Severity (Wagner): Grade 1 Wound Margin: Indistinct, nonvisible Exudate Amount: Large Exudate Type: Serous Exudate Color: amber or After Cleansing: No Wound Bed Granulation Amount: None Present (0%) Exposed Structure Necrotic Amount: Large (67-100%) Fascia Exposed: No Necrotic Quality: Adherent Slough Fat Layer Exposed: No Blank, Kaila ([id number]) Tendon Exposed: No Muscle Exposed: No Joint Exposed: No Bone Exposed: No Limited to Skin Breakdown Periwound Skin Texture Texture Color No Abnormalities Noted: No No Abnormalities Noted: No Callus: No Atrophie Blanche: No Crepitus: No Cyanosis: No Excoriation: No Ecchymosis: No Fluctuance: No Erythema:  No Friable: No Hemosiderin Staining: No Induration: No Mottled: No Localized Edema: No Pallor: No Rash: No Rubor: No Scarring: No Temperature / Pain Moisture Temperature: No Abnormality No Abnormalities Noted: No Tenderness on Palpation: Yes Dry / Scaly: No Maceration: No Moist: Yes Wound Preparation Ulcer Cleansing: Rinsed/Irrigated with Saline Topical Anesthetic Applied: Other: lidocaine, Treatment Notes Wound #1 (Left Calcaneous) 1. Cleansed with: Clean wound with Normal Saline 2. Anesthetic Topical Lidocaine 4% cream to wound bed prior to debridement 3. Peri-wound Care: Skin Prep 4. Dressing Applied: Santyl Ointment 5. Secondary Dressing Applied Foam Gauze and Kerlix/Conform 7. Secured with Tape Notes DARCO WITH PEG ASSIST HAGG, Onesha ([id number]) Electronic Signature(s) Signed: 06/19/2015 5:51:12 PM By: Alejandro Mulling Entered By: Alejandro Mulling on 06/19/2015 08:15:51 Brigandi, Shaquavia ([id number]) -------------------------------------------------------------------------------- Wound Assessment Details Patient Name: [name] Date of Service: 06/19/2015 8:00 AM Medical Record Number:  [id number] Patient Account Number: [id number] Date of Birth/Sex: 04-02-1928 (81 y.o. Female) Treating RN: Ashok Cordia, Debi Primary Care Physician: Loney Laurence Other Clinician: Referring Physician: Loney Laurence Treating Physician/Extender: Rudene Re in Treatment: 16 Wound Status Wound Number: 3 Primary Trauma, Other Etiology: Wound Location: Foot - Dorsal Wound Open Wounding Event: Gradually Appeared Status: Date Acquired: 06/19/2015 Comorbid Anemia, Hypertension, Type II Weeks Of Treatment: 0 History: Diabetes, History of pressure wounds, Clustered Wound: No Osteoarthritis, Dementia Photos Photo Uploaded By: Alejandro Mulling on 06/19/2015 17:25:30 Wound Measurements Length: (cm) 0.4 Width: (cm) 0.4 Depth: (cm) 0.1 Area: (cm)  0.126 Volume: (cm) 0.013 % Reduction in Area: % Reduction in Volume: Epithelialization: None Tunneling: No Undermining: No Wound Description Classification: Partial Thickness Foul Odor A Diabetic Severity (Wagner): Grade 1 Wound Margin: Flat and Intact Exudate Amount: Large Exudate Type: Serous Exudate Color: amber fter Cleansing: No Wound Bed Granulation Amount: None Present (0%) Exposed Structure Necrotic Amount: Large (67-100%) Fascia Exposed: No Necrotic Quality: Adherent Slough Fat Layer Exposed: No Kincannon, Jakerra ([id number]) Tendon Exposed: No Muscle Exposed: No Joint Exposed: No Bone Exposed: No Limited to Skin Breakdown Periwound Skin Texture Texture Color No Abnormalities Noted: No No Abnormalities Noted: No Moisture No Abnormalities Noted: No Moist: Yes Wound Preparation Ulcer Cleansing: Rinsed/Irrigated with Saline Topical Anesthetic Applied: Other: lidocaine 4%, Treatment Notes Wound #3 (Dorsal Foot) 1. Cleansed with: Clean wound with Normal Saline 2. Anesthetic Topical Lidocaine 4% cream to wound bed prior to debridement 3. Peri-wound Care: Skin Prep 4. Dressing Applied: Santyl Ointment 5. Secondary Dressing Applied Foam Gauze and Kerlix/Conform 7. Secured with Tape Notes DARCO WITH PEG ASSIST Electronic Signature(s) Signed: 06/19/2015 5:51:12 PM By: Alejandro Mulling Entered By: Alejandro Mulling on 06/19/2015 08:19:48 Bolger, Luberta [name] ([id number]) -------------------------------------------------------------------------------- Vitals Details Patient Name: [name] Date of Service: 06/19/2015 8:00 AM Medical Record Number: [id number] Patient Account Number: [id number] Date of Birth/Sex: 1928/02/24 (82 y.o. Female) Treating RN: Ashok Cordia, Debi Primary Care Physician: Loney Laurence Other Clinician: Referring Physician: Loney Laurence Treating Physician/Extender: Rudene Re in Treatment: 16 Vital Signs Time  Taken: 08:08 Temperature (F): 98.0 Height (in): 64 Pulse (bpm): 87 Respiratory Rate (breaths/min): 18 Blood Pressure (mmHg): 111/95 Reference Range: 80 - 120 mg / dl Electronic Signature(s) Signed: 06/19/2015 5:51:12 PM By: Alejandro Mulling Entered By: Alejandro Mulling on 06/19/2015 08:10:05

## 2015-06-20 NOTE — Progress Notes (Signed)
SUGAR, VANZANDT ([id number]) Visit Report for 06/19/2015 Chief Complaint Document Details Patient Name: [name] Date of Service: 06/19/2015 8:00 AM Medical Record Number: [id number] Patient Account Number: [id number] Date of Birth/Sex: Jul 21, 1927 (82 y.o. Female) Treating RN: Ashok Cordia, Debi Primary Care Physician: Loney Laurence Other Clinician: Referring Physician: Loney Laurence Treating Physician/Extender: Rudene Re in Treatment: 16 Information Obtained from: Patient Chief Complaint patient is back to see [place] today for diabetic foot ulcer which she's had for several months. Electronic Signature(s) Signed: 06/19/2015 8:38:20 AM By: Evlyn Kanner MD, FACS Entered By: Evlyn Kanner on 06/19/2015 08:38:20 Welford, [name] ([id number]) -------------------------------------------------------------------------------- Debridement Details Patient Name: [name] Date of Service: 06/19/2015 8:00 AM Medical Record Number: [id number] Patient Account Number: [id number] Date of Birth/Sex: 23-Jun-1927 (82 y.o. Female) Treating RN: Ashok Cordia, Debi Primary Care Physician: Loney Laurence Other Clinician: Referring Physician: Loney Laurence Treating Physician/Extender: Rudene Re in Treatment: 16 Debridement Performed for Wound #1 Left Calcaneous Assessment: Performed By: Physician Evlyn Kanner, MD Debridement: Debridement Pre-procedure Yes Verification/Time Out Taken: Start Time: 08:26 Pain Control: Other : lidocaine 4% Level: Skin/Subcutaneous Tissue Total Area Debrided (L x 2.8 (cm) x 3 (cm) = 8.4 (cm) W): Tissue and other Viable, Non-Viable, Exudate, Fibrin/Slough, Subcutaneous material debrided: Instrument: Curette Bleeding: Minimum Hemostasis Achieved: Pressure End Time: 08:30 Procedural Pain: 0 Post Procedural Pain: 0 Response to Treatment: Procedure was tolerated well Post Debridement Measurements of Total Wound Length: (cm)  2.8 Stage: Unstageable/Unclassified Width: (cm) 3 Depth: (cm) 0.8 Volume: (cm) 5.278 Post Procedure Diagnosis Same as Pre-procedure Electronic Signature(s) Signed: 06/19/2015 8:38:15 AM By: Evlyn Kanner MD, FACS Signed: 06/19/2015 5:51:12 PM By: Alejandro Mulling Entered By: Evlyn Kanner on 06/19/2015 08:38:15 Kavanaugh, Loana ([id number]) -------------------------------------------------------------------------------- HPI Details Patient Name: [name] Date of Service: 06/19/2015 8:00 AM Medical Record Number: [id number] Patient Account Number: [id number] Date of Birth/Sex: July 04, 1927 (82 y.o. Female) Treating RN: Ashok Cordia, Debi Primary Care Physician: Loney Laurence Other Clinician: Referring Physician: Loney Laurence Treating Physician/Extender: Rudene Re in Treatment: 16 History of Present Illness Location: left heel and dorsum of the foot Quality: Patient reports No Pain. Severity: Patient states wound (s) are getting better. Duration: the patient has had surgery in early December and had vascular testing ordered but not done. Timing: Pain in wound is Intermittent (comes and goes Context: The wound appeared gradually over time Modifying Factors: Other treatment(s) tried include: local dressings and offloading. surgery done by Dr. Graciela Husbands for debridement of the left heel wound Associated Signs and Symptoms: Patient reports having difficulty standing for long periods. HPI Description: 82 year old patient with past medical history of Alzheimer's dementia, diabetes mellitus, hypertension and recent history of right hip arthroplasty in August 2016. Her past medical history is significant also for hypothyroidism, hypertension, urinary retention, dementia. 04/11/2015 -- she has had a lot of pain and left heel and there has been a lot of drainage today with purulent material. He has not been running fever. 04/18/2015 -- last week she was here with a florid  infection of her left heel and I had sent her to the hospital for admission. She was admitted between 12/ 2 and 12/ 5 and she underwent a debridement of the left heel abscess with placement of antibiotic beads with vancomycin, by Dr. Linus Galas. Her cultures grew gram- negative bacteria, was given vancomycin and Unasyn IV in hospital and she was continued on oral antibiotics and was given documented for 10 days. at the time of discharge Dr. Alberteen Spindle wanted the dressing to be intact for about a  week before it would need changing. 05/29/2014 -- she has not been to see [place] for the last 6 weeks and from what I understand she has been in rehabilitation facility. No vascular testing was done as we had requested. She has a diabetic foot ulcer which was graded as a Wagner 3 when she was seen last about a month and a half ago. There is no family member to discuss hyperbaric oxygen therapy and the patient has as Alzheimer's dementia and I do not believe she understands our discussions 06/12/2015 -- a vascular appointment is pending for tomorrow. 06/19/2015 -- the vascular workup revealed that the left lower extremity had a 50-99% stenosis in the left superficial femoral, popliteal and tibioperoneal trunk. 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 Dr. Wyn Quaker has recommended angiographically and possible stent placement for this critical ischemia. Electronic Signature(s) Signed: 06/19/2015 8:42:17 AM By: Evlyn Kanner MD, FACS Entered By: Evlyn Kanner on 06/19/2015 08:42:17 NYISHA, CLIPPARD ([id number]) -------------------------------------------------------------------------------- Physical Exam Details Patient Name: [name] Date of Service: 06/19/2015 8:00 AM Medical Record Number: [id number] Patient Account Number: [id number] Date of Birth/Sex: 02-13-1928 (82 y.o. Female) Treating RN: Ashok Cordia, Debi Primary Care Physician: Loney Laurence Other Clinician: Referring  Physician: Loney Laurence Treating Physician/Extender: Rudene Re in Treatment: 16 Constitutional . Pulse regular. Respirations normal and unlabored. Afebrile. . Eyes Nonicteric. Reactive to light. Ears, Nose, Mouth, and Throat Lips, teeth, and gums WNL.Marland Kitchen Moist mucosa without lesions. Neck supple and nontender. No palpable supraclavicular or cervical adenopathy. Normal sized without goiter. Respiratory WNL. No retractions.. Cardiovascular Pedal Pulses WNL. No clubbing, cyanosis or edema. Lymphatic No adneopathy. No adenopathy. No adenopathy. Musculoskeletal Adexa without tenderness or enlargement.. Digits and nails w/o clubbing, cyanosis, infection, petechiae, ischemia, or inflammatory conditions.. Integumentary (Hair, Skin) No suspicious lesions. No crepitus or fluctuance. No peri-wound warmth or erythema. No masses.Marland Kitchen Psychiatric Judgement and insight Intact.. No evidence of depression, anxiety, or agitation.. Notes the left he had significant subcutaneous debris which was sharply debrided with a curette and hemostasis was achieved with pressure. She has a new pressure injury on the dorsum of her left foot possibly due to her wrap and this has eschar and indeterminate depth. Electronic Signature(s) Signed: 06/19/2015 8:43:15 AM By: Evlyn Kanner MD, FACS Entered By: Evlyn Kanner on 06/19/2015 08:43:15 [name] ([id number]) -------------------------------------------------------------------------------- Physician Orders Details Patient Name: [name] Date of Service: 06/19/2015 8:00 AM Medical Record Number: [id number] Patient Account Number: [id number] Date of Birth/Sex: 1928/01/10 (82 y.o. Female) Treating RN: Ashok Cordia, Debi Primary Care Physician: Loney Laurence Other Clinician: Referring Physician: Loney Laurence Treating Physician/Extender: Rudene Re in Treatment: 16 Verbal / Phone Orders: Yes Clinician: Ashok Cordia,  Debi Read Back and Verified: Yes Diagnosis Coding Wound Cleansing Wound #1 Left Calcaneous o Clean wound with Normal Saline. Wound #3 Dorsal Foot o Clean wound with Normal Saline. Anesthetic Wound #1 Left Calcaneous o Topical Lidocaine 4% cream applied to wound bed prior to debridement Wound #3 Dorsal Foot o Topical Lidocaine 4% cream applied to wound bed prior to debridement Skin Barriers/Peri-Wound Care Wound #1 Left Calcaneous o Skin Prep Wound #3 Dorsal Foot o Skin Prep Primary Wound Dressing Wound #1 Left Calcaneous o Santyl Ointment o Foam Wound #3 Dorsal Foot o Santyl Ointment o Foam Secondary Dressing Wound #1 Left Calcaneous o Gauze and Kerlix/Conform - DO NOT PLACE ON TOO TIGHT Wound #3 Dorsal Foot o Gauze and Kerlix/Conform - DO NOT PLACE ON TOO TIGHT Sirianni, Lezlee ([id number]) Dressing Change Frequency Wound #1  Left Calcaneous o Change dressing every other day. - for home health purposes Wound #3 Dorsal Foot o Change dressing every other day. - for home health purposes Follow-up Appointments Wound #1 Left Calcaneous o Return Appointment in 1 week. Wound #3 Dorsal Foot o Return Appointment in 1 week. Off-Loading Wound #1 Left Calcaneous o Heel suspension boot to: - sage boots PEG ASSIST SHOE o Turn and reposition every 2 hours o Other: - FLOAT HEELS WHEN LYING IN BED Wound #3 Dorsal Foot o Heel suspension boot to: - sage boots PEG ASSIST SHOE o Turn and reposition every 2 hours o Other: - FLOAT HEELS WHEN LYING IN BED Home Health Wound #1 Left Calcaneous o Continue Home Health Visits - [facility] o Home Health Nurse may visit PRN to address patientos wound care needs. o FACE TO FACE ENCOUNTER: MEDICARE and MEDICAID PATIENTS: I certify that this patient is under my care and that I had a face-to-face encounter that meets the physician face-to-face encounter requirements with this patient on this  date. The encounter with the patient was in whole or in part for the following MEDICAL CONDITION: (primary reason for Home Healthcare) MEDICAL NECESSITY: I certify, that based on my findings, NURSING services are a medically necessary home health service. HOME BOUND STATUS: I certify that my clinical findings support that this patient is homebound (i.e., Due to illness or injury, pt requires aid of supportive devices such as crutches, cane, wheelchairs, walkers, the use of special transportation or the assistance of another person to leave their place of residence. There is a normal inability to leave the home and doing so requires considerable and taxing effort. Other absences are for medical reasons / religious services and are infrequent or of short duration when for other reasons). o If current dressing causes regression in wound condition, may D/C ordered dressing product/s and apply Normal Saline Moist Dressing daily until next Wound Healing Center / Other MD appointment. Notify Wound Healing Center of regression in wound condition at [phone]. LIYAT, FAULKENBERRY ([id number]) o Please direct any NON-WOUND related issues/requests for orders to patient's Primary Care Physician Wound #3 Dorsal Foot o Continue Home Health Visits - [facility] o Home Health Nurse may visit PRN to address patientos wound care needs. o FACE TO FACE ENCOUNTER: MEDICARE and MEDICAID PATIENTS: I certify that this patient is under my care and that I had a face-to-face encounter that meets the physician face-to-face encounter requirements with this patient on this date. The encounter with the patient was in whole or in part for the following MEDICAL CONDITION: (primary reason for Home Healthcare) MEDICAL NECESSITY: I certify, that based on my findings, NURSING services are a medically necessary home health service. HOME BOUND STATUS: I certify that my clinical findings support that this patient is  homebound (i.e., Due to illness or injury, pt requires aid of supportive devices such as crutches, cane, wheelchairs, walkers, the use of special transportation or the assistance of another person to leave their place of residence. There is a normal inability to leave the home and doing so requires considerable and taxing effort. Other absences are for medical reasons / religious services and are infrequent or of short duration when for other reasons). o If current dressing causes regression in wound condition, may D/C ordered dressing product/s and apply Normal Saline Moist Dressing daily until next Wound Healing Center / Other MD appointment. Notify Wound Healing Center of regression in wound condition at [phone]. o Please direct any NON-WOUND related issues/requests for  orders to patient's Primary Care Physician Electronic Signature(s) Signed: 06/19/2015 4:43:36 PM By: Evlyn Kanner MD, FACS Signed: 06/19/2015 5:51:12 PM By: Alejandro Mulling Entered By: Alejandro Mulling on 06/19/2015 08:38:47 Kirchner, Ritamarie ([id number]) -------------------------------------------------------------------------------- Problem List Details Patient Name: [name] Date of Service: 06/19/2015 8:00 AM Medical Record Number: [id number] Patient Account Number: [id number] Date of Birth/Sex: 12-10-1927 (82 y.o. 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 Inactive Problems Resolved  Problems ICD-10 Code Description Active Date Resolved Date L89.521 Pressure ulcer of left ankle, stage 1 02/27/2015 02/27/2015 L03.116 Cellulitis of left lower limb 04/11/2015 04/11/2015 Electronic Signature(s) Signed: 06/19/2015 8:38:05 AM By: Evlyn Kanner MD, FACS Entered By: Evlyn Kanner on 06/19/2015 08:38:05 Sievert, [name] ([id number]) -------------------------------------------------------------------------------- Progress Note Details Patient Name: [name] Date of Service: 06/19/2015 8:00 AM Medical Record Number: [id number] Patient Account Number: [id number] Date of Birth/Sex: 02-22-1928 (82 y.o. Female) Treating RN: Ashok Cordia, Debi Primary Care Physician: Loney Laurence Other Clinician: Referring Physician: Loney Laurence Treating Physician/Extender: Rudene Re in Treatment: 16 Subjective Chief Complaint Information obtained from Patient patient is back to see [place] today for diabetic foot ulcer which she's had for several months. History of Present Illness (HPI) The following HPI elements were documented for the patient's wound: Location: left heel and dorsum of the foot Quality: Patient reports No Pain. Severity: Patient states wound (s) are getting better. Duration: the patient has had surgery in early December and had vascular testing ordered but not done. Timing: Pain in wound is Intermittent (comes and goes Context: The wound appeared gradually over time Modifying Factors: Other treatment(s) tried include: local dressings and offloading. surgery done by Dr. Graciela Husbands for debridement of the left heel wound Associated Signs and Symptoms: Patient reports having difficulty standing for long periods. 82 year old patient with past medical history of Alzheimer's dementia, diabetes mellitus, hypertension and recent history of right hip arthroplasty in August 2016. Her past medical history is significant also for hypothyroidism, hypertension, urinary  retention, dementia. 04/11/2015 -- she has had a lot of pain and left heel and there has been a lot of drainage today with purulent material. He has not been running fever. 04/18/2015 -- last week she was here with a florid infection of her left heel and I had sent her to the hospital for admission. She was admitted between 12/ 2 and 12/ 5 and she underwent a debridement of the left heel abscess with placement of antibiotic beads with vancomycin, by Dr. Linus Galas. Her cultures grew gram- negative bacteria, was given vancomycin and Unasyn IV in hospital and she was continued on oral antibiotics and was given documented for 10 days. at the time of discharge Dr. Alberteen Spindle wanted the dressing to be intact for about a week before it would need changing. 05/29/2014 -- she has not been to see [place] for the last 6 weeks and from what I understand she has been in rehabilitation facility. No vascular testing was done as we had requested. She has a diabetic foot ulcer which was graded as a Wagner 3 when she was seen last about a month and a half ago. There is no family member  to discuss hyperbaric oxygen therapy and the patient has as Alzheimer's dementia and I do not believe she understands our discussions 06/12/2015 -- a vascular appointment is pending for tomorrow. 06/19/2015 -- the vascular workup revealed that the left lower extremity had a 50-99% stenosis in the left superficial femoral, popliteal and tibioperoneal trunk. 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 Dr. Wyn Quaker has recommended angiographically and possible stent placement for this critical ischemia. Bibby, Jearlene ([id number]) Objective Constitutional Pulse regular. Respirations normal and unlabored. Afebrile. Vitals Time Taken: 8:08 AM, Height: 64 in, Temperature: 98.0 F, Pulse: 87 bpm, Respiratory Rate: 18 breaths/min, Blood Pressure: 111/95 mmHg. Eyes Nonicteric. Reactive to light. Ears, Nose, Mouth, and  Throat Lips, teeth, and gums WNL.Marland Kitchen Moist mucosa without lesions. Neck supple and nontender. No palpable supraclavicular or cervical adenopathy. Normal sized without goiter. Respiratory WNL. No retractions.. Cardiovascular Pedal Pulses WNL. No clubbing, cyanosis or edema. Lymphatic No adneopathy. No adenopathy. No adenopathy. Musculoskeletal Adexa without tenderness or enlargement.. Digits and nails w/o clubbing, cyanosis, infection, petechiae, ischemia, or inflammatory conditions.Marland Kitchen Psychiatric Judgement and insight Intact.. No evidence of depression, anxiety, or agitation.. General Notes: the left he had significant subcutaneous debris which was sharply debrided with a curette and hemostasis was achieved with pressure. She has a new pressure injury on the dorsum of her left foot possibly due to her wrap and this has eschar and indeterminate depth. Integumentary (Hair, Skin) No suspicious lesions. No crepitus or fluctuance. No peri-wound warmth or erythema. No masses.. Wound #1 status is Open. Original cause of wound was Pressure Injury. The wound is located on the Left Calcaneous. The wound measures 2.8cm length x 3cm width x 0.8cm depth; 6.597cm^2 area and Fatica, Roselinda ([id number]) 5.278cm^3 volume. The wound is limited to skin breakdown. There is no tunneling or undermining noted. There is a large amount of serous drainage noted. The wound margin is indistinct and nonvisible. There is no granulation within the wound bed. There is a large (67-100%) amount of necrotic tissue within the wound bed including Adherent Slough. The periwound skin appearance exhibited: Moist. The periwound skin appearance did not exhibit: Callus, Crepitus, Excoriation, Fluctuance, Friable, Induration, Localized Edema, Rash, Scarring, Dry/Scaly, Maceration, Atrophie Blanche, Cyanosis, Ecchymosis, Hemosiderin Staining, Mottled, Pallor, Rubor, Erythema. Periwound temperature was noted as No Abnormality. The  periwound has tenderness on palpation. Wound #3 status is Open. Original cause of wound was Gradually Appeared. The wound is located on the Dorsal Foot. The wound measures 0.4cm length x 0.4cm width x 0.1cm depth; 0.126cm^2 area and 0.013cm^3 volume. The wound is limited to skin breakdown. There is no tunneling or undermining noted. There is a large amount of serous drainage noted. The wound margin is flat and intact. There is no granulation within the wound bed. There is a large (67-100%) amount of necrotic tissue within the wound bed including Adherent Slough. The periwound skin appearance exhibited: Moist. 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 Driscilla Grammes note. They are trying to get in touch with the patient's daughter who lives in [place]. As far as a local care goes we will continue with Santyl on both the wounds and a very light wrap so as to keep things  in place. And distend the patient has begun walking a bit and hence he will need a PEG assist shoe. Procedures Wound #1 Wound #1 is a Pressure Ulcer located on the Left Calcaneous . There was a Skin/Subcutaneous Tissue Debridement ([id number]) debridement with total area of 8.4 sq cm performed by Evlyn Kanner, MD. with the following instrument(s): Curette to remove Viable and Non-Viable tissue/material including Exudate, Fibrin/Slough, and Subcutaneous after achieving pain control using Other (lidocaine 4%). A time out was conducted prior to the start of the procedure. A Minimum amount of bleeding was controlled with Pressure. The procedure was tolerated well with a pain level of 0 throughout and a pain  level of 0 following the procedure. Post Debridement Measurements: 2.8cm length x 3cm width x 0.8cm depth; 5.278cm^3 volume. Mohr, Talasia ([id number]) Post debridement Stage noted as Unstageable/Unclassified. Post procedure Diagnosis Wound #1: Same as Pre-Procedure Plan Wound Cleansing: Wound #1 Left Calcaneous: Clean wound with Normal Saline. Wound #3 Dorsal Foot: Clean wound with Normal Saline. Anesthetic: Wound #1 Left Calcaneous: Topical Lidocaine 4% cream applied to wound bed prior to debridement Wound #3 Dorsal Foot: Topical Lidocaine 4% cream applied to wound bed prior to debridement Skin Barriers/Peri-Wound Care: Wound #1 Left Calcaneous: Skin Prep Wound #3 Dorsal Foot: Skin Prep Primary Wound Dressing: Wound #1 Left Calcaneous: Santyl Ointment Foam Wound #3 Dorsal Foot: Santyl Ointment Foam Secondary Dressing: Wound #1 Left Calcaneous: Gauze and Kerlix/Conform - DO NOT PLACE ON TOO TIGHT Wound #3 Dorsal Foot: Gauze and Kerlix/Conform - DO NOT PLACE ON TOO TIGHT Dressing Change Frequency: Wound #1 Left Calcaneous: Change dressing every other day. - for home health purposes Wound #3 Dorsal Foot: Change dressing every other day. - for home health purposes Follow-up Appointments: Wound #1 Left Calcaneous: Return Appointment in 1 week. Wound #3 Dorsal Foot: Return Appointment in 1 week. Off-Loading: Wound #1 Left Calcaneous: Heel suspension boot to: - sage boots PEG ASSIST SHOE Crager, Missy ([id number]) Turn and reposition every 2 hours Other: - FLOAT HEELS WHEN LYING IN BED Wound #3 Dorsal Foot: Heel suspension boot to: - sage boots PEG ASSIST SHOE Turn and reposition every 2 hours Other: - FLOAT HEELS WHEN LYING IN BED Home Health: Wound #1 Left Calcaneous: Continue Home Health Visits - [facility] Home Health Nurse may visit PRN to address patient s wound care needs. FACE TO FACE ENCOUNTER: MEDICARE and MEDICAID PATIENTS: I certify that this  patient is under my care and that I had a face-to-face encounter that meets the physician face-to-face encounter requirements with this patient on this date. The encounter with the patient was in whole or in part for the following MEDICAL CONDITION: (primary reason for Home Healthcare) MEDICAL NECESSITY: I certify, that based on my findings, NURSING services are a medically necessary home health service. HOME BOUND STATUS: I certify that my clinical findings support that this patient is homebound (i.e., Due to illness or injury, pt requires aid of supportive devices such as crutches, cane, wheelchairs, walkers, the use of special transportation or the assistance of another person to leave their place of residence. There is a normal inability to leave the home and doing so requires considerable and taxing effort. Other absences are for medical reasons / religious services and are infrequent or of short duration when for other reasons). If current dressing causes regression in wound condition, may D/C ordered dressing product/s and apply Normal Saline Moist Dressing daily until next Wound Healing Center / Other MD appointment. Notify Wound Healing Center of  regression in wound condition at [phone]. Please direct any NON-WOUND related issues/requests for orders to patient's Primary Care Physician Wound #3 Dorsal Foot: Continue Home Health Visits - [facility] Nurse may visit PRN to address patient s wound care needs. FACE TO FACE ENCOUNTER: MEDICARE and MEDICAID PATIENTS: I certify that this patient is under my care and that I had a face-to-face encounter that meets the physician face-to-face encounter requirements with this patient on this date. The encounter with the patient was in whole or in part for the following MEDICAL CONDITION: (primary reason for Home Healthcare) MEDICAL NECESSITY: I certify, that based on my findings, NURSING services are a medically necessary home health  service. HOME BOUND STATUS: I certify that my clinical findings support that this patient is homebound (i.e., Due to illness or injury, pt requires aid of supportive devices such as crutches, cane, wheelchairs, walkers, the use of special transportation or the assistance of another person to leave their place of residence. There is a normal inability to leave the home and doing so requires considerable and taxing effort. Other absences are for medical reasons / religious services and are infrequent or of short duration when for other reasons). If current dressing causes regression in wound condition, may D/C ordered dressing product/s and apply Normal Saline Moist Dressing daily until next Wound Healing Center / Other MD appointment. Notify Wound Healing Center of regression in wound condition at [phone]. Please direct any NON-WOUND related issues/requests for orders to patient's Primary Care Physician I have discussed with the patient and her caregiver regarding the need for a vascular intervention as per Dr. Driscilla Grammes note. They are trying to get in touch with the patient's daughter who lives in [place]. As far as a Yerian, Ashika ([id number]) local care goes we will continue with Santyl on both the wounds and a very light wrap so as to keep things in place. And distend the patient has begun walking a bit and hence he will need a PEG assist shoe. Electronic Signature(s) Signed: 06/19/2015 8:44:12 AM By: Evlyn Kanner MD, FACS Entered By: Evlyn Kanner on 06/19/2015 08:44:12 Ragain, [name] ([id number]) -------------------------------------------------------------------------------- SuperBill Details Patient Name: [name] Date of Service: 06/19/2015 Medical Record Number: [id number] Patient Account Number: [id number] Date of Birth/Sex: 12/05/1927 (82 y.o. Female) Treating RN: Ashok Cordia, Debi Primary Care Physician: Loney Laurence Other Clinician: Referring Physician:  Loney Laurence Treating Physician/Extender: Rudene Re in Treatment: 16 Diagnosis Coding ICD-10 Codes Code Description E11.621 Type 2 diabetes mellitus with foot ulcer L89.620 Pressure ulcer of left heel, unstageable G30.9 Alzheimer's disease, unspecified L97.422 Non-pressure chronic ulcer of left heel and midfoot with fat layer exposed Facility Procedures CPT4 Code Description: 29528413 11042 - DEB SUBQ TISSUE 20 SQ CM/< ICD-10 Description Diagnosis E11.621 Type 2 diabetes mellitus with foot ulcer L89.620 Pressure ulcer of left heel, unstageable G30.9 Alzheimer's disease, unspecified L97.422 Non-pressure  chronic ulcer of left heel and midfoot Modifier: with fat laye Quantity: 1 r exposed Physician Procedures CPT4 Code Description: 2440102 99213 - WC PHYS LEVEL 3 - EST PT ICD-10 Description Diagnosis E11.621 Type 2 diabetes mellitus with foot ulcer L89.620 Pressure ulcer of left heel, unstageable G30.9 Alzheimer's disease, unspecified L97.422 Non-pressure  chronic ulcer of left heel and midfoot Modifier: 25 with fat laye Quantity: 1 r exposed CPT4 Code Description: 7253664 11042 - WC PHYS SUBQ TISS 20 SQ CM ICD-10 Description Diagnosis E11.621 Type 2 diabetes mellitus with foot ulcer L89.620 Pressure ulcer of left heel, unstageable G30.9 Alzheimer's disease, unspecified L97.422 Non-pressure  chronic ulcer of left heel and midfoot Oleson, Katena ([id number]) Modifier: with fat laye Quantity: 1 r exposed Electronic Signature(s) Signed: 06/19/2015 8:44:36 AM By: Evlyn Kanner MD, FACS Entered By: Evlyn Kanner on 06/19/2015 08:44:36

## 2015-06-26 ENCOUNTER — Encounter (HOSPITAL_BASED_OUTPATIENT_CLINIC_OR_DEPARTMENT_OTHER): Payer: Medicare Other | Admitting: General Surgery

## 2015-06-26 DIAGNOSIS — L8990 Pressure ulcer of unspecified site, unspecified stage: Secondary | ICD-10-CM

## 2015-06-26 DIAGNOSIS — E11621 Type 2 diabetes mellitus with foot ulcer: Secondary | ICD-10-CM | POA: Diagnosis not present

## 2015-06-26 NOTE — Progress Notes (Signed)
seeiheal 

## 2015-06-27 NOTE — Progress Notes (Signed)
[name] ([id number]) Visit Report for 06/26/2015 Chief Complaint Document Details Patient Name: [name], [name] Date of Service: 06/26/2015 8:00 AM Medical Record Number: [id number] Patient Account Number: [id number] Date of Birth/Sex: 06-Feb-1928 (82 y.o. Female) Treating RN: [name], [name] Primary Care Physician: [name] Other Clinician: Referring Physician: Loney [name] Treating Physician/Extender: [name] in Treatment: 17 Information Obtained from: Patient Chief Complaint patient is back to see [place] today for diabetic foot ulcer which she's had for several months. Electronic Signature(s) Signed: 06/27/2015 8:22:20 AM By: Gloria Sax MD Entered By: [name] on 06/26/2015 09:03:45 [name], [name] ([id number]) -------------------------------------------------------------------------------- Debridement Details Patient Name: [name] Date of Service: 06/26/2015 8:00 AM Medical Record Patient Account Number: [id number] Number: Afful, RN, BSN, Treating RN: Jun 09, 1927 (82 y.o. [place] Date of Birth/Sex: Female) Other Clinician: Primary Care Physician: [name] Treating [name] Referring Physician: Loney [name] Physician/Extender: [name] in Treatment: 17 Debridement Performed for Wound #1 Left Calcaneous Assessment: Performed By: Physician Gloria Sax, MD Debridement: Debridement Pre-procedure Yes Verification/Time Out Taken: Start Time: 08:22 Pain Control: Lidocaine 4% Topical Solution Level: Skin/Subcutaneous Tissue Total Area Debrided (L x 2 (cm) x 2.5 (cm) = 5 (cm) W): Tissue and other Non-Viable, Fibrin/Slough, Subcutaneous material debrided: Instrument: Curette Bleeding: Minimum Hemostasis Achieved: Pressure End Time: 08:24 Procedural Pain: 3 Post Procedural Pain: 3 Response to Treatment: Procedure was tolerated well Post Debridement Measurements of Total Wound Length: (cm)  2 Stage: Unstageable/Unclassified Width: (cm) 2.5 Depth: (cm) 0.7 Volume: (cm) 2.749 Post Procedure Diagnosis Same as Pre-procedure Electronic Signature(s) Signed: 06/26/2015 11:28:49 AM By: [name] BSN, RN Signed: 06/27/2015 8:22:20 AM By: Gloria Sax MD Entered By: [name] on 06/26/2015 08:27:25 [name] ([id number]) -------------------------------------------------------------------------------- HPI Details Patient Name: [name] Date of Service: 06/26/2015 8:00 AM Medical Record Number: [id number] Patient Account Number: [id number] Date of Birth/Sex: [birth year] (82 y.o. Female) Treating RN: [name], [name] Primary Care Physician: [name] Other Clinician: Referring Physician: Loney [name] Treating Physician/Extender: [name] in Treatment: 17 History of Present Illness Location: left heel and dorsum of the foot Quality: Patient reports No Pain. Severity: Patient states wound (s) are getting better. Duration: the patient has had surgery in early December and had vascular testing ordered but not done. Timing: Pain in wound is Intermittent (comes and goes Context: The wound appeared gradually over time Modifying Factors: Other treatment(s) tried include: local dressings and offloading. surgery done by [name] for debridement of the left heel wound Associated Signs and Symptoms: Patient reports having difficulty standing for long periods. HPI Description: 82 year old patient with past medical history of Alzheimer's dementia, diabetes mellitus, hypertension and recent history of right hip arthroplasty in August 2016. Her past medical history is significant also for hypothyroidism, hypertension, urinary retention, dementia. 04/11/2015 -- she has had a lot of pain and left heel and there has been a lot of drainage today with purulent material. He has not been running fever. 04/18/2015 -- last week she was here with a florid  infection of her left heel and I had sent her to the hospital for admission. She was admitted between 12/ 2 and 12/ 5 and she underwent a debridement of the left heel abscess with placement of antibiotic beads with vancomycin, by [name]. Her cultures grew gram- negative bacteria, was given vancomycin and Unasyn IV in hospital and she was continued on oral antibiotics and was given documented for 10 days. at the time of discharge [name] wanted the dressing to be intact for about a  week before it would need changing. 05/29/2014 -- she has not been to see [place] for the last 6 [name] and from what I understand she has been in rehabilitation facility. No vascular testing was done as we had requested. She has a diabetic foot ulcer which was graded as a Wagner 3 when she was seen last about a month and a half ago. There is no family member to discuss hyperbaric oxygen therapy and the patient has as Alzheimer's dementia and I do not believe she understands our discussions 06/12/2015 -- a vascular appointment is pending for tomorrow. 06/19/2015 -- the vascular workup revealed that the left lower extremity had a 50-99% stenosis in the left superficial femoral, popliteal and tibioperoneal trunk. 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 Dr. Wyn [name] has recommended angiographically and possible stent placement for this critical ischemia. Electronic Signature(s) Signed: 06/27/2015 8:22:20 AM By: Gloria Sax MD Entered By: [name] on 06/26/2015 09:03:54 [name], [name] ([id number]) -------------------------------------------------------------------------------- Physical Exam Details Patient Name: [name] Date of Service: 06/26/2015 8:00 AM Medical Record Number: [id number] Patient Account Number: [id number] Date of Birth/Sex: [birth year] (82 y.o. Female) Treating RN: [name], [name] Primary Care Physician: [name] Other Clinician: Referring Physician:  Loney [name] Treating Physician/Extender: [name] in Treatment: 17 Electronic Signature(s) Signed: 06/27/2015 8:22:20 AM By: Gloria Sax MD Entered By: [name] on 06/26/2015 09:04:00 [name] ([id number]) -------------------------------------------------------------------------------- Physician Orders Details Patient Name: [name] Date of Service: 06/26/2015 8:00 AM Medical Record Patient Account Number: [id number] Number: Afful, RN, BSN, Treating RN: 01-27-1928 (82 y.o. [place] Date of Birth/Sex: Female) Other Clinician: Primary Care Physician: [name] Treating Jimmey Ralph, Jasey Cortez Referring Physician: Loney [name] Physician/Extender: [name] in Treatment: 36 Verbal / Phone Orders: Yes Clinician: Afful, RN, BSN, Rita Read Back and Verified: Yes Diagnosis Coding Wound Cleansing Wound #1 Left Calcaneous o Clean wound with Normal Saline. Wound #3 Dorsal Foot o Clean wound with Normal Saline. Anesthetic Wound #1 Left Calcaneous o Topical Lidocaine 4% cream applied to wound bed prior to debridement Wound #3 Dorsal Foot o Topical Lidocaine 4% cream applied to wound bed prior to debridement Skin Barriers/Peri-Wound Care Wound #1 Left Calcaneous o Skin Prep Wound #3 Dorsal Foot o Skin Prep Primary Wound Dressing Wound #1 Left Calcaneous o Santyl Ointment Wound #3 Dorsal Foot o Santyl Ointment Secondary Dressing Wound #1 Left Calcaneous o Dry Gauze o Boardered Foam Dressing Wound #3 Dorsal Foot [place], [place] ([id number]) o Dry Gauze o Boardered Foam Dressing Dressing Change Frequency Wound #1 Left Calcaneous o Change dressing every other day. - for home health purposes Wound #3 Dorsal Foot o Change dressing every other day. - for home health purposes Follow-up Appointments Wound #1 Left Calcaneous o Return Appointment in 1 week. Wound #3 Dorsal Foot o Return Appointment in  1 week. Off-Loading Wound #1 Left Calcaneous o Heel suspension boot to: - sage boots PEG ASSIST SHOE o Turn and reposition every 2 hours o Other: - FLOAT HEELS WHEN LYING IN BED Wound #3 Dorsal Foot o Heel suspension boot to: - sage boots PEG ASSIST SHOE o Turn and reposition every 2 hours o Other: - FLOAT HEELS WHEN LYING IN BED Home Health Wound #1 Left Calcaneous o Continue Home Health Visits - [facility] *****PLEASE CHANGE YOUR DAY TO MONDAYS******* PT IS BEING SEEN IN THE CLINIC ON THURSDAYS NOW******* o Home Health Nurse may visit PRN to address patientos wound care needs. o FACE TO FACE ENCOUNTER: MEDICARE and MEDICAID PATIENTS: I certify  that this patient is under my care and that I had a face-to-face encounter that meets the physician face-to-face encounter requirements with this patient on this date. The encounter with the patient was in whole or in part for the following MEDICAL CONDITION: (primary reason for Home Healthcare) MEDICAL NECESSITY: I certify, that based on my findings, NURSING services are a medically necessary home health service. HOME BOUND STATUS: I certify that my clinical findings support that this patient is homebound (i.e., Due to illness or injury, pt requires aid of supportive devices such as crutches, cane, wheelchairs, walkers, the use of special transportation or the assistance of another person to leave their place of residence. There is a normal inability to leave the home and doing so requires considerable and taxing effort. Other absences are for medical reasons / religious services and are infrequent or of short duration when for other reasons). [name], [name] ([id number]) o If current dressing causes regression in wound condition, may D/C ordered dressing product/s and apply Normal Saline Moist Dressing daily until next Wound Healing Center / Other MD appointment. Notify Wound Healing Center of regression in wound  condition at [phone]. o Please direct any NON-WOUND related issues/requests for orders to patient's Primary Care Physician Wound #3 Dorsal Foot o Continue Home Health Visits - [facility] o Home Health Nurse may visit PRN to address patientos wound care needs. o FACE TO FACE ENCOUNTER: MEDICARE and MEDICAID PATIENTS: I certify that this patient is under my care and that I had a face-to-face encounter that meets the physician face-to-face encounter requirements with this patient on this date. The encounter with the patient was in whole or in part for the following MEDICAL CONDITION: (primary reason for Home Healthcare) MEDICAL NECESSITY: I certify, that based on my findings, NURSING services are a medically necessary home health service. HOME BOUND STATUS: I certify that my clinical findings support that this patient is homebound (i.e., Due to illness or injury, pt requires aid of supportive devices such as crutches, cane, wheelchairs, walkers, the use of special transportation or the assistance of another person to leave their place of residence. There is a normal inability to leave the home and doing so requires considerable and taxing effort. Other absences are for medical reasons / religious services and are infrequent or of short duration when for other reasons). o If current dressing causes regression in wound condition, may D/C ordered dressing product/s and apply Normal Saline Moist Dressing daily until next Wound Healing Center / Other MD appointment. Notify Wound Healing Center of regression in wound condition at [phone]. o Please direct any NON-WOUND related issues/requests for orders to patient's Primary Care Physician Electronic Signature(s) Signed: 06/26/2015 3:36:03 PM By: Alejandro Mulling Signed: 06/27/2015 8:22:20 AM By: Gloria Sax MD Entered By: Alejandro Mulling on 06/26/2015 08:41:56 [name], [name]  ([id number]) -------------------------------------------------------------------------------- Problem List Details Patient Name: [name] Date of Service: 06/26/2015 8:00 AM Medical Record Number: [id number] Patient Account Number: [id number] Date of Birth/Sex: Sep 30, 1927 (82 y.o. 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 Inactive Problems Resolved Problems ICD-10 Code Description Active Date Resolved Date L89.521 Pressure ulcer of left  ankle, stage 1 02/27/2015 02/27/2015 L03.116 Cellulitis of left lower limb 04/11/2015 04/11/2015 Electronic Signature(s) Signed: 06/27/2015 8:22:20 AM By: Gloria Sax MD Entered By: [name] on 06/26/2015 09:03:36 [name], [name] ([id number]) -------------------------------------------------------------------------------- Progress Note Details Patient Name: [name] Date of Service: 06/26/2015 8:00 AM Medical Record Number: [id number] Patient Account Number: [id number] Date of Birth/Sex: 1927/08/02 (82 y.o. Female) Treating RN: [name], [name] Primary Care Physician: [name] Other Clinician: Referring Physician: Loney [name] Treating Physician/Extender: [name] in Treatment: 17 Subjective Chief Complaint Information obtained from Patient patient is back to see [place] today for diabetic foot ulcer which she's had for several months. History of Present Illness (HPI) The following HPI elements  were documented for the patient's wound: Location: left heel and dorsum of the foot Quality: Patient reports No Pain. Severity: Patient states wound (s) are getting better. Duration: the patient has had surgery in early December and had vascular testing ordered but not done. Timing: Pain in wound is Intermittent (comes and goes Context: The wound appeared gradually over time Modifying Factors: Other treatment(s) tried include: local dressings and offloading. surgery done by [name] for debridement of the left heel wound Associated Signs and Symptoms: Patient reports having difficulty standing for long periods. 82 year old patient with past medical history of Alzheimer's dementia, diabetes mellitus, hypertension and recent history of right hip arthroplasty in August 2016. Her past medical history is significant also for hypothyroidism, hypertension, urinary retention, dementia. 04/11/2015 -- she has had a lot of pain and left heel and there has been a lot of drainage today with purulent material. He has not been running fever. 04/18/2015 -- last week she was here with a florid infection of her left heel and I had sent her to the hospital for admission. She was admitted between 12/ 2 and 12/ 5 and she underwent a debridement of the left heel abscess with placement of antibiotic beads with vancomycin, by [name]. Her cultures grew gram- negative bacteria, was given vancomycin and Unasyn IV in hospital and she was continued on oral antibiotics and was given documented for 10 days. at the time of discharge [name] wanted the dressing to be intact for about a week before it would need changing. 05/29/2014 -- she has not been to see [place] for the last 6 [name] and from what I understand she has been in rehabilitation facility. No vascular testing was done as we had requested. She has a diabetic foot ulcer which was graded as a Wagner 3 when she was seen last about a month and a half ago. There  is no family member to discuss hyperbaric oxygen therapy and the patient has as Alzheimer's dementia and I do not believe she understands our discussions 06/12/2015 -- a vascular appointment is pending for tomorrow. 06/19/2015 -- the vascular workup revealed that the left lower extremity had a 50-99% stenosis in the left superficial femoral, popliteal and tibioperoneal trunk. 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 Dr. Wyn [name] has recommended angiographically and possible stent placement for this critical ischemia. [name], [name] ([id number]) Objective Constitutional Vitals Time Taken: 8:06 AM, Height: 64 in, Temperature: 98.2 F, Pulse: 83 bpm, Respiratory Rate: 18 breaths/min, Blood Pressure: 118/97 mmHg. Integumentary (Hair, Skin) Wound #1 status is Open. Original cause of wound was Pressure Injury. The wound is located on the Left Calcaneous. The wound measures 2cm length x 2.5cm width x 0.7cm depth; 3.927cm^2 area and 2.749cm^3 volume. The wound is limited to skin breakdown. There  is no tunneling or undermining noted. There is a large amount of serous drainage noted. The wound margin is indistinct and nonvisible. There is no granulation within the wound bed. There is a large (67-100%) amount of necrotic tissue within the wound bed including Adherent Slough. The periwound skin appearance exhibited: Moist. The periwound skin appearance did not exhibit: Callus, Crepitus, Excoriation, Fluctuance, Friable, Induration, Localized Edema, Rash, Scarring, Dry/Scaly, Maceration, Atrophie Blanche, Cyanosis, Ecchymosis, Hemosiderin Staining, Mottled, Pallor, Rubor, Erythema. Periwound temperature was noted as No Abnormality. The periwound has tenderness on palpation. Wound #3 status is Open. Original cause of wound was Gradually Appeared. The wound is located on the Dorsal Foot. The wound measures 0.4cm length x 0.4cm width x 0.1cm depth; 0.126cm^2 area and 0.013cm^3 volume.  The wound is limited to skin breakdown. There is no tunneling or undermining noted. There is a large amount of serous drainage noted. The wound margin is flat and intact. There is no granulation within the wound bed. There is a large (67-100%) amount of necrotic tissue within the wound bed including Adherent Slough. The periwound skin appearance exhibited: Moist. Assessment Active Problems ICD-10 E11.621 - Type 2 diabetes mellitus with foot ulcer L89.620 - Pressure ulcer of left heel, unstageable G30.9 - Alzheimer's disease, unspecified L97.422 - Non-pressure chronic ulcer of left heel and midfoot with fat layer exposed [name], [name] ([id number]) Debrided heel with curette and will continue Santyl dressings Procedures Wound #1 Wound #1 is a Pressure Ulcer located on the Left Calcaneous . There was a Skin/Subcutaneous Tissue Debridement ([id number]) debridement with total area of 5 sq cm performed by Gloria Sax, MD. with the following instrument(s): Curette to remove Non-Viable tissue/material including Fibrin/Slough and Subcutaneous after achieving pain control using Lidocaine 4% Topical Solution. A time out was conducted prior to the start of the procedure. A Minimum amount of bleeding was controlled with Pressure. The procedure was tolerated well with a pain level of 3 throughout and a pain level of 3 following the procedure. Post Debridement Measurements: 2cm length x 2.5cm width x 0.7cm depth; 2.749cm^3 volume. Post debridement Stage noted as Unstageable/Unclassified. Post procedure Diagnosis Wound #1: Same as Pre-Procedure Plan Wound Cleansing: Wound #1 Left Calcaneous: Clean wound with Normal Saline. Wound #3 Dorsal Foot: Clean wound with Normal Saline. Anesthetic: Wound #1 Left Calcaneous: Topical Lidocaine 4% cream applied to wound bed prior to debridement Wound #3 Dorsal Foot: Topical Lidocaine 4% cream applied to wound bed prior to debridement Skin  Barriers/Peri-Wound Care: Wound #1 Left Calcaneous: Skin Prep Wound #3 Dorsal Foot: Skin Prep Primary Wound Dressing: Wound #1 Left Calcaneous: Santyl Ointment Wound #3 Dorsal Foot: Santyl Ointment Secondary Dressing: Wound #1 Left Calcaneous: Dry Gauze Boardered Foam Dressing Wound #3 Dorsal Foot: Dry Gauze Boardered Foam Dressing [name], [name] ([id number]) Dressing Change Frequency: Wound #1 Left Calcaneous: Change dressing every other day. - for home health purposes Wound #3 Dorsal Foot: Change dressing every other day. - for home health purposes Follow-up Appointments: Wound #1 Left Calcaneous: Return Appointment in 1 week. Wound #3 Dorsal Foot: Return Appointment in 1 week. Off-Loading: Wound #1 Left Calcaneous: Heel suspension boot to: - sage boots PEG ASSIST SHOE Turn and reposition every 2 hours Other: - FLOAT HEELS WHEN LYING IN BED Wound #3 Dorsal Foot: Heel suspension boot to: - sage boots PEG ASSIST SHOE Turn and reposition every 2 hours Other: - FLOAT HEELS WHEN LYING IN BED Home Health: Wound #1 Left Calcaneous: Continue Home Health Visits - [facility] *****PLEASE CHANGE YOUR DAY TO  MONDAYS******* PT IS BEING SEEN IN THE CLINIC ON THURSDAYS NOW******* Home Health Nurse may visit PRN to address patient s wound care needs. FACE TO FACE ENCOUNTER: MEDICARE and MEDICAID PATIENTS: I certify that this patient is under my care and that I had a face-to-face encounter that meets the physician face-to-face encounter requirements with this patient on this date. The encounter with the patient was in whole or in part for the following MEDICAL CONDITION: (primary reason for Home Healthcare) MEDICAL NECESSITY: I certify, that based on my findings, NURSING services are a medically necessary home health service. HOME BOUND STATUS: I certify that my clinical findings support that this patient is homebound (i.e., Due to illness or injury, pt requires aid of supportive  devices such as crutches, cane, wheelchairs, walkers, the use of special transportation or the assistance of another person to leave their place of residence. There is a normal inability to leave the home and doing so requires considerable and taxing effort. Other absences are for medical reasons / religious services and are infrequent or of short duration when for other reasons). If current dressing causes regression in wound condition, may D/C ordered dressing product/s and apply Normal Saline Moist Dressing daily until next Wound Healing Center / Other MD appointment. Notify Wound Healing Center of regression in wound condition at [phone]. Please direct any NON-WOUND related issues/requests for orders to patient's Primary Care Physician Wound #3 Dorsal Foot: Continue Home Health Visits - [facility] Home Health Nurse may visit PRN to address patient s wound care needs. FACE TO FACE ENCOUNTER: MEDICARE and MEDICAID PATIENTS: I certify that this patient is under my care and that I had a face-to-face encounter that meets the physician face-to-face encounter requirements with this patient on this date. The encounter with the patient was in whole or in part for the following MEDICAL CONDITION: (primary reason for Home Healthcare) MEDICAL NECESSITY: I certify, that based on my findings, NURSING services are a medically necessary home health service. HOME BOUND STATUS: I certify that my clinical findings support that this patient is homebound (i.e., Due to illness or injury, pt requires aid of supportive devices such as crutches, cane, wheelchairs, walkers, the use of special transportation or the assistance of another person to leave their place of residence. There is a normal inability to leave the home and doing so requires considerable and taxing effort. Other absences are for medical reasons / religious services and are infrequent or of short duration when for other reasons). [name],  [name] ([id number]) If current dressing causes regression in wound condition, may D/C ordered dressing product/s and apply Normal Saline Moist Dressing daily until next Wound Healing Center / Other MD appointment. Notify Wound Healing Center of regression in wound condition at [phone]. Please direct any NON-WOUND related issues/requests for orders to patient's Primary Care Physician Follow-Up Appointments: A follow-up appointment should be scheduled. Medication Reconciliation completed and provided to Patient/Care Provider. A Patient Clinical Summary of Care was provided to [facility] No change in wound. Needs stent in femoral artery and this being planned. Electronic Signature(s) Signed: 06/26/2015 3:15:21 PM By: Gloria Sax MD Entered By: [name] on 06/26/2015 15:15:21 [name], [name] ([id number]) -------------------------------------------------------------------------------- SuperBill Details Patient Name: [name] Date of Service: 06/26/2015 Medical Record Number: [id number] Patient Account Number: [id number] Date of Birth/Sex: Dec 18, 1927 (82 y.o. Female) Treating RN: [name], [name] Primary Care Physician: [name] Other Clinician: Referring Physician: Loney [name] Treating Physician/Extender: [name] [name] in Treatment: 17 Diagnosis Coding ICD-10 Codes Code Description E11.621 Type  2 diabetes mellitus with foot ulcer L89.620 Pressure ulcer of left heel, unstageable G30.9 Alzheimer's disease, unspecified L97.422 Non-pressure chronic ulcer of left heel and midfoot with fat layer exposed Facility Procedures CPT4 Code: 16109604 Description: 11042 - DEB SUBQ TISSUE 20 SQ CM/< ICD-10 Description Diagnosis E11.621 Type 2 diabetes mellitus with foot ulcer Modifier: Quantity: 1 Physician Procedures CPT4 Code: 5409811 Description: 99213 - WC PHYS LEVEL 3 - EST PT ICD-10 Description Diagnosis L89.620 Pressure ulcer of left heel,  unstageable Modifier: Quantity: 1 CPT4 Code: 9147829 Description: 11042 - WC PHYS SUBQ TISS 20 SQ CM ICD-10 Description Diagnosis E11.621 Type 2 diabetes mellitus with foot ulcer Modifier: Quantity: 1 Electronic Signature(s) Signed: 06/26/2015 3:17:53 PM By: Gloria Sax MD Entered By: [name] on 06/26/2015 15:17:53

## 2015-06-28 NOTE — Progress Notes (Signed)
[name], [name] ([id number]) Visit Report for 06/26/2015 Arrival Information Details Patient Name: [name], [name] Date of Service: 06/26/2015 8:00 AM Medical Record Number: [id number] Patient Account Number: [id number] Date of Birth/Sex: 1928-04-18 (82 y.o. Female) Treating RN: Ashok Cordia, Debi Primary Care Physician: Loney Laurence Other Clinician: Referring Physician: Loney Laurence Treating Physician/Extender: Elayne Snare in Treatment: 17 Visit Information History Since Last Visit All ordered tests and consults were completed: No Patient Arrived: Wheel Chair Added or deleted any medications: No Arrival Time: 08:04 Any new allergies or adverse reactions: No Accompanied By: caregiver Had a fall or experienced change in No Transfer Assistance: None activities of daily living that may affect Patient Identification Verified: Yes risk of falls: Secondary Verification Process Yes Signs or symptoms of abuse/neglect since last No Completed: visito Patient Requires Transmission- No Hospitalized since last visit: No Based Precautions: Pain Present Now: No Patient Has Alerts: Yes Patient Alerts: Patient on Blood Thinner ABI: Non Compressible Electronic Signature(s) Signed: 06/27/2015 4:34:04 PM By: Elpidio Eric BSN, RN Entered By: Elpidio Eric on 06/26/2015 15:29:06 [name] ([id number]) -------------------------------------------------------------------------------- Encounter Discharge Information Details Patient Name: [name] Date of Service: 06/26/2015 8:00 AM Medical Record Number: [id number] Patient Account Number: [id number] Date of Birth/Sex: 05/15/1927 (82 y.o. Female) Treating RN: Ashok Cordia, Debi Primary Care Physician: Loney Laurence Other Clinician: Referring Physician: Loney Laurence Treating Physician/Extender: Elayne Snare in Treatment: 17 Encounter Discharge Information Items Discharge Pain Level: 0 Discharge  Condition: Stable Ambulatory Status: Wheelchair Discharge Destination: Nursing Home Transportation: Private Auto Accompanied By: caregiver Schedule Follow-up Appointment: Yes Medication Reconciliation completed and provided to Patient/Care Yes [name]: Provided on Clinical Summary of Care: 06/26/2015 Form Type Recipient Paper Patient BM Electronic Signature(s) Signed: 06/27/2015 8:22:20 AM By: Ardath Sax MD Previous Signature: 06/26/2015 8:40:07 AM Version By: Gwenlyn Perking Entered By: Ardath Sax on 06/26/2015 08:50:00 [name] ([id number]) -------------------------------------------------------------------------------- Lower Extremity Assessment Details Patient Name: [name] Date of Service: 06/26/2015 8:00 AM Medical Record Number: [id number] Patient Account Number: [id number] Date of Birth/Sex: 11-23-1927 (82 y.o. Female) Treating RN: Ashok Cordia, Debi Primary Care Physician: Loney Laurence Other Clinician: Referring Physician: Loney Laurence Treating Physician/Extender: Elayne Snare in Treatment: 17 Vascular Assessment Pulses: Posterior Tibial Dorsalis Pedis Palpable: [Left:Yes] Extremity colors, hair growth, and conditions: Extremity Color: [Left:Normal] Temperature of Extremity: [Left:Warm] Capillary Refill: [Left:< 3 seconds] Toe Nail Assessment Left: Right: Thick: Yes Discolored: No Deformed: No Improper Length and Hygiene: No Electronic Signature(s) Signed: 06/26/2015 3:36:03 PM By: Alejandro Mulling Signed: 06/27/2015 4:34:04 PM By: Elpidio Eric BSN, RN Entered By: Elpidio Eric on 06/26/2015 15:29:44 [name] ([id number]) -------------------------------------------------------------------------------- Multi Wound Chart Details Patient Name: [name] Date of Service: 06/26/2015 8:00 AM Medical Record Number: [id number] Patient Account Number: [id number] Date of Birth/Sex: October 22, 1927 (82 y.o. Female) Treating  RN: Ashok Cordia, Debi Primary Care Physician: Loney Laurence Other Clinician: Referring Physician: Loney Laurence Treating Physician/Extender: Elayne Snare in Treatment: 17 Vital Signs Height(in): 64 Pulse(bpm): 83 Weight(lbs): Blood Pressure 118/97 (mmHg): Body Mass Index(BMI): Temperature(F): 98.2 Respiratory Rate 18 (breaths/min): Photos: [N/A:N/A] Wound Location: Left Calcaneous Foot - Dorsal N/A Wounding Event: Pressure Injury Gradually Appeared N/A Primary Etiology: Pressure Ulcer Trauma, Other N/A Comorbid History: Anemia, Hypertension, Anemia, Hypertension, N/A Type II Diabetes, History Type II Diabetes, History of pressure wounds, of pressure wounds, Osteoarthritis, Dementia Osteoarthritis, Dementia Date Acquired: 01/30/2015 06/19/2015 N/A Weeks of Treatment: 17 1 N/A Wound Status: Open Open N/A Measurements L x W x D 2x2.5x0.7 0.4x0.4x0.1 N/A (cm) Area (cm) : 3.927 0.126 N/A Volume (cm) : 2.749 0.013  N/A % Reduction in Area: 59.20% 0.00% N/A % Reduction in Volume: -185.80% 0.00% N/A Classification: Unstageable/Unclassified Partial Thickness N/A HBO Classification: Grade 1 Grade 1 N/A Exudate Amount: Large Large N/A Exudate Type: Serous Serous N/A Exudate Color: amber amber N/A Wound Margin: Indistinct, nonvisible Flat and Intact N/A Granulation Amount: None Present (0%) None Present (0%) N/A [name] ([id number]) Necrotic Amount: Large (67-100%) Large (67-100%) N/A Exposed Structures: Fascia: No Fascia: No N/A Fat: No Fat: No Tendon: No Tendon: No Muscle: No Muscle: No Joint: No Joint: No Bone: No Bone: No Limited to Skin Limited to Skin Breakdown Breakdown Epithelialization: None None N/A Debridement: Debridement (62130- N/A N/A 11047) Time-Out Taken: Yes N/A N/A Pain Control: Lidocaine 4% Topical N/A N/A Solution Tissue Debrided: Fibrin/Slough, N/A N/A Subcutaneous Level: Skin/Subcutaneous N/A  N/A Tissue Debridement Area (sq 5 N/A N/A cm): Instrument: Curette N/A N/A Bleeding: Minimum N/A N/A Hemostasis Achieved: Pressure N/A N/A Procedural Pain: 3 N/A N/A Post Procedural Pain: 3 N/A N/A Debridement Treatment Procedure was tolerated N/A N/A Response: well Post Debridement 2x2.5x0.7 N/A N/A Measurements L x W x D (cm) Post Debridement 2.749 N/A N/A Volume: (cm) Post Debridement Unstageable/Unclassified N/A N/A Stage: Periwound Skin Texture: Edema: No No Abnormalities Noted N/A Excoriation: No Induration: No Callus: No Crepitus: No Fluctuance: No Friable: No Rash: No Scarring: No Periwound Skin Moist: Yes Moist: Yes N/A Moisture: Maceration: No Dry/Scaly: No Periwound Skin Color: Atrophie Blanche: No No Abnormalities Noted N/A Cyanosis: No Ecchymosis: No [place], Anyela ([id number]) Erythema: No Hemosiderin Staining: No Mottled: No Pallor: No Rubor: No Temperature: No Abnormality N/A N/A Tenderness on Yes No N/A Palpation: Wound Preparation: Ulcer Cleansing: Ulcer Cleansing: N/A Rinsed/Irrigated with Rinsed/Irrigated with Saline Saline Topical Anesthetic Topical Anesthetic Applied: Other: lidocaine Applied: Other: lidocaine 4% Procedures Performed: Debridement N/A N/A Treatment Notes Wound #1 (Left Calcaneous) 1. Cleansed with: Clean wound with Normal Saline 2. Anesthetic Topical Lidocaine 4% cream to wound bed prior to debridement 3. Peri-wound Care: Skin Prep 4. Dressing Applied: Santyl Ointment 5. Secondary Dressing Applied Bordered Foam Dressing Dry Gauze Wound #3 (Dorsal Foot) 1. Cleansed with: Clean wound with Normal Saline 2. Anesthetic Topical Lidocaine 4% cream to wound bed prior to debridement 3. Peri-wound Care: Skin Prep 4. Dressing Applied: Santyl Ointment 5. Secondary Dressing Applied Bordered Foam Dressing Dry Gauze Electronic Signature(s) Signed: 06/27/2015 4:34:04 PM By: Elpidio Eric BSN, RN [place], Belanna  ([id number]) Entered By: Elpidio Eric on 06/26/2015 15:30:31 [name] ([id number]) -------------------------------------------------------------------------------- Multi-Disciplinary Care Plan Details Patient Name: [name] Date of Service: 06/26/2015 8:00 AM Medical Record Number: [id number] Patient Account Number: [id number] Date of Birth/Sex: [birth year] (82 y.o. Female) Treating RN: Ashok Cordia, Debi Primary Care Physician: Loney Laurence Other Clinician: Referring Physician: Loney Laurence Treating Physician/Extender: Elayne Snare in Treatment: 17 Active Inactive Orientation to the Wound Care Program Nursing Diagnoses: Knowledge deficit related to the wound healing center program Goals: Patient/caregiver will verbalize understanding of the Wound Healing Center Program Date Initiated: 02/27/2015 Goal Status: Active Interventions: Provide education on orientation to the wound center Notes: Pressure Nursing Diagnoses: Knowledge deficit related to management of pressures ulcers Potential for impaired tissue integrity related to pressure, friction, moisture, and shear Goals: Patient will remain free from development of additional pressure ulcers Date Initiated: 02/27/2015 Goal Status: Active Patient will remain free of pressure ulcers Date Initiated: 02/27/2015 Goal Status: Active Patient/caregiver will verbalize risk factors for pressure ulcer development Date Initiated: 02/27/2015 Goal Status: Active Patient/caregiver will verbalize understanding of pressure ulcer management Date Initiated: 02/27/2015  Goal Status: Active Interventions: Assess: immobility, friction, shearing, incontinence upon admission and as needed Plotner, Dalylah ([id number]) Assess offloading mechanisms upon admission and as needed Assess potential for pressure ulcer upon admission and as needed Provide education on pressure ulcers Treatment Activities: Patient referred  for home evaluation of offloading devices/mattresses : 06/26/2015 Patient referred for pressure reduction/relief devices : 06/26/2015 Notes: Wound/Skin Impairment Nursing Diagnoses: Impaired tissue integrity Knowledge deficit related to ulceration/compromised skin integrity Goals: Patient/caregiver will verbalize understanding of skin care regimen Date Initiated: 02/27/2015 Goal Status: Active Ulcer/skin breakdown will have a volume reduction of 30% by week 4 Date Initiated: 02/27/2015 Goal Status: Active Ulcer/skin breakdown will have a volume reduction of 50% by week 8 Date Initiated: 02/27/2015 Goal Status: Active Ulcer/skin breakdown will have a volume reduction of 80% by week 12 Date Initiated: 02/27/2015 Goal Status: Active Ulcer/skin breakdown will heal within 14 weeks Date Initiated: 02/27/2015 Goal Status: Active Interventions: Assess patient/caregiver ability to obtain necessary supplies Assess patient/caregiver ability to perform ulcer/skin care regimen upon admission and as needed Assess ulceration(s) every visit Provide education on ulcer and skin care Treatment Activities: Skin care regimen initiated : 06/26/2015 Notes: Electronic Signature(s) SHELVIA, FOJTIK ([id number]) Signed: 06/26/2015 3:36:03 PM By: Alejandro Mulling Signed: 06/27/2015 4:34:04 PM By: Elpidio Eric BSN, RN Entered By: Elpidio Eric on 06/26/2015 15:30:14 Caven, Luberta [name] ([id number]) -------------------------------------------------------------------------------- Pain Assessment Details Patient Name: [name] Date of Service: 06/26/2015 8:00 AM Medical Record Number: [id number] Patient Account Number: [id number] Date of Birth/Sex: 1927-12-09 (82 y.o. Female) Treating RN: Ashok Cordia, Debi Primary Care Physician: Loney Laurence Other Clinician: Referring Physician: Loney Laurence Treating Physician/Extender: Elayne Snare in Treatment: 17 Active Problems Location of Pain  Severity and Description of Pain Patient Has Paino No Site Locations Pain Management and Medication Current Pain Management: Electronic Signature(s) Signed: 06/26/2015 3:36:03 PM By: Alejandro Mulling Signed: 06/27/2015 4:34:04 PM By: Elpidio Eric BSN, RN Entered By: Elpidio Eric on 06/26/2015 15:29:14 [name] ([id number]) -------------------------------------------------------------------------------- Patient/Caregiver Education Details Patient Name: [name] Date of Service: 06/26/2015 8:00 AM Medical Record Number: [id number] Patient Account Number: [id number] Date of Birth/Gender: 08/18/1927 (82 y.o. Female) Treating RN: Afful, RN, BSN, [place] Primary Care Physician: Loney Laurence Other Clinician: Referring Physician: Loney Laurence Treating Physician/Extender: Elayne Snare in Treatment: 17 Education Assessment Education Provided To: Patient and Caregiver Education Topics Provided Pressure: Methods: Explain/Verbal Responses: State content correctly Welcome To The Wound Care Center: Methods: Explain/Verbal Responses: State content correctly Wound/Skin Impairment: Handouts: Other: change dressing as ordered Methods: Explain/Verbal Responses: State content correctly Electronic Signature(s) Signed: 06/27/2015 8:22:20 AM By: Ardath Sax MD Entered By: Ardath Sax on 06/26/2015 08:50:12 Arai, Luberta [name] ([id number]) -------------------------------------------------------------------------------- Wound Assessment Details Patient Name: [name] Date of Service: 06/26/2015 8:00 AM Medical Record Number: [id number] Patient Account Number: [id number] Date of Birth/Sex: [birth year] (82 y.o. Female) Treating RN: Ashok Cordia, Debi Primary Care Physician: Loney Laurence Other Clinician: Referring Physician: Loney Laurence Treating Physician/Extender: Ardath Sax Weeks in Treatment: 17 Wound Status Wound Number: 1 Primary Pressure  Ulcer Etiology: Wound Location: Left Calcaneous Wound Open Wounding Event: Pressure Injury Status: Date Acquired: 01/30/2015 Comorbid Anemia, Hypertension, Type II Weeks Of Treatment: 17 History: Diabetes, History of pressure wounds, Clustered Wound: No Osteoarthritis, Dementia Photos Photo Uploaded By: Alejandro Mulling on 06/26/2015 11:37:56 Wound Measurements Length: (cm) 2 Width: (cm) 2.5 Depth: (cm) 0.7 Area: (cm) 3.927 Volume: (cm) 2.749 % Reduction in Area: 59.2% % Reduction in Volume: -185.8% Epithelialization: None Tunneling: No Undermining: No Wound Description Classification: Unstageable/Unclassified Foul Od Diabetic Severity [address]): Grade  1 Wound Margin: Indistinct, nonvisible Exudate Amount: Large Exudate Type: Serous Exudate Color: amber or After Cleansing: No Wound Bed Granulation Amount: None Present (0%) Exposed Structure Necrotic Amount: Large (67-100%) Fascia Exposed: No Necrotic Quality: Adherent Slough Fat Layer Exposed: No Darsey, Amery ([id number]) Tendon Exposed: No Muscle Exposed: No Joint Exposed: No Bone Exposed: No Limited to Skin Breakdown Periwound Skin Texture Texture Color No Abnormalities Noted: No No Abnormalities Noted: No Callus: No Atrophie Blanche: No Crepitus: No Cyanosis: No Excoriation: No Ecchymosis: No Fluctuance: No Erythema: No Friable: No Hemosiderin Staining: No Induration: No Mottled: No Localized Edema: No Pallor: No Rash: No Rubor: No Scarring: No Temperature / Pain Moisture Temperature: No Abnormality No Abnormalities Noted: No Tenderness on Palpation: Yes Dry / Scaly: No Maceration: No Moist: Yes Wound Preparation Ulcer Cleansing: Rinsed/Irrigated with Saline Topical Anesthetic Applied: Other: lidocaine, Treatment Notes Wound #1 (Left Calcaneous) 1. Cleansed with: Clean wound with Normal Saline 2. Anesthetic Topical Lidocaine 4% cream to wound bed prior to debridement 3.  Peri-wound Care: Skin Prep 4. Dressing Applied: Santyl Ointment 5. Secondary Dressing Applied Bordered Foam Dressing Dry Gauze Electronic Signature(s) Signed: 06/26/2015 3:36:03 PM By: Alejandro Mulling Entered By: Alejandro Mulling on 06/26/2015 08:14:58 Glomski, Nuha ([id number]) Malek, Rickelle ([id number]) -------------------------------------------------------------------------------- Wound Assessment Details Patient Name: [name] Date of Service: 06/26/2015 8:00 AM Medical Record Number: [id number] Patient Account Number: [id number] Date of Birth/Sex: May 12, 1927 (82 y.o. Female) Treating RN: Ashok Cordia, Debi Primary Care Physician: Loney Laurence Other Clinician: Referring Physician: Loney Laurence Treating Physician/Extender: Elayne Snare in Treatment: 17 Wound Status Wound Number: 3 Primary Trauma, Other Etiology: Wound Location: Foot - Dorsal Wound Open Wounding Event: Gradually Appeared Status: Date Acquired: 06/19/2015 Comorbid Anemia, Hypertension, Type II Weeks Of Treatment: 1 History: Diabetes, History of pressure wounds, Clustered Wound: No Osteoarthritis, Dementia Photos Photo Uploaded By: Alejandro Mulling on 06/26/2015 11:37:56 Wound Measurements Length: (cm) 0.4 Width: (cm) 0.4 Depth: (cm) 0.1 Area: (cm) 0.126 Volume: (cm) 0.013 % Reduction in Area: 0% % Reduction in Volume: 0% Epithelialization: None Tunneling: No Undermining: No Wound Description Classification: Partial Thickness Foul Odor A Diabetic Severity ([place]): Grade 1 Wound Margin: Flat and Intact Exudate Amount: Large Exudate Type: Serous Exudate Color: amber fter Cleansing: No Wound Bed Granulation Amount: None Present (0%) Exposed Structure Necrotic Amount: Large (67-100%) Fascia Exposed: No Necrotic Quality: Adherent Slough Fat Layer Exposed: No Sako, Gisela ([id number]) Tendon Exposed: No Muscle Exposed: No Joint Exposed: No Bone Exposed:  No Limited to Skin Breakdown Periwound Skin Texture Texture Color No Abnormalities Noted: No No Abnormalities Noted: No Moisture No Abnormalities Noted: No Moist: Yes Wound Preparation Ulcer Cleansing: Rinsed/Irrigated with Saline Topical Anesthetic Applied: Other: lidocaine 4%, Treatment Notes Wound #3 (Dorsal Foot) 1. Cleansed with: Clean wound with Normal Saline 2. Anesthetic Topical Lidocaine 4% cream to wound bed prior to debridement 3. Peri-wound Care: Skin Prep 4. Dressing Applied: Santyl Ointment 5. Secondary Dressing Applied Bordered Foam Dressing Dry Gauze Electronic Signature(s) Signed: 06/26/2015 3:36:03 PM By: Alejandro Mulling Entered By: Alejandro Mulling on 06/26/2015 08:15:14 Spurrier, Luberta [name] ([id number]) -------------------------------------------------------------------------------- Vitals Details Patient Name: [name] Date of Service: 06/26/2015 8:00 AM Medical Record Number: [id number] Patient Account Number: [id number] Date of Birth/Sex: Mar 29, 1928 (82 y.o. Female) Treating RN: Ashok Cordia, Debi Primary Care Physician: Loney Laurence Other Clinician: Referring Physician: Loney Laurence Treating Physician/Extender: Elayne Snare in Treatment: 17 Vital Signs Time Taken: 08:06 Temperature (F): 98.2 Height (in): 64 Pulse (bpm): 83 Respiratory Rate (breaths/min): 18 Blood Pressure (mmHg): 118/97 Reference Range: 80 -  120 mg / dl Electronic Signature(s) Signed: 06/27/2015 4:34:04 PM By: Elpidio Eric BSN, RN Entered By: Elpidio Eric on 06/26/2015 15:29:21

## 2015-06-30 ENCOUNTER — Other Ambulatory Visit: Payer: Self-pay | Admitting: Vascular Surgery

## 2015-07-03 ENCOUNTER — Encounter: Payer: Medicare Other | Admitting: Surgery

## 2015-07-03 DIAGNOSIS — E11621 Type 2 diabetes mellitus with foot ulcer: Secondary | ICD-10-CM | POA: Diagnosis not present

## 2015-07-04 ENCOUNTER — Other Ambulatory Visit
Admission: RE | Admit: 2015-07-04 | Discharge: 2015-07-04 | Disposition: A | Discharge status: Home / Self Care | Payer: Medicare Other | Source: Ambulatory Visit | Attending: Vascular Surgery | Admitting: Vascular Surgery

## 2015-07-04 DIAGNOSIS — Z0000 Encounter for general adult medical examination without abnormal findings: Secondary | ICD-10-CM | POA: Diagnosis present

## 2015-07-04 LAB — CREATININE, SERUM
CREATININE: 0.71 mg/dL (ref 0.44–1.00)
GFR calc non Af Amer: >60 mL/min (ref >60)

## 2015-07-04 LAB — BUN: BUN: 13 mg/dL (ref 6–20)

## 2015-07-05 NOTE — Progress Notes (Signed)
KAIRA, STRINGFIELD ([id number]) Visit Report for 07/03/2015 Arrival Information Details Patient Name: EDDIS, PINGLETON Date of Service: 07/03/2015 8:00 AM Medical Record Number: [id number] Patient Account Number: [id number] Date of Birth/Sex: 08-Jun-1927 (82 y.o. Female) Treating RN: Ashok Cordia, Debi Primary Care Physician: Loney Laurence Other Clinician: Referring Physician: Loney Laurence Treating Physician/Extender: Rudene Re in Treatment: 18 Visit Information History Since Last Visit All ordered tests and consults were completed: No Patient Arrived: Wheel Chair Added or deleted any medications: No Arrival Time: 08:18 Any new allergies or adverse reactions: No Accompanied By: caregiver Had a fall or experienced change in No Transfer Assistance: EasyPivot Patient activities of daily living that may affect Lift risk of falls: Patient Identification Verified: Yes Signs or symptoms of abuse/neglect since last No Secondary Verification Process Yes visito Completed: Hospitalized since last visit: No Patient Requires Transmission- No Pain Present Now: No Based Precautions: Patient Has Alerts: Yes Patient Alerts: Patient on Blood Thinner ABI: Non Compressible Electronic Signature(s) Signed: 07/04/2015 5:53:24 PM By: Alejandro Mulling Entered By: Alejandro Mulling on 07/03/2015 08:21:00 Ghan, [name] ([id number]) -------------------------------------------------------------------------------- Encounter Discharge Information Details Patient Name: [name] Date of Service: 07/03/2015 8:00 AM Medical Record Number: [id number] Patient Account Number: [id number] Date of Birth/Sex: 1928-01-11 (82 y.o. Female) Treating RN: Ashok Cordia, Debi Primary Care Physician: Loney Laurence Other Clinician: Referring Physician: Loney Laurence Treating Physician/Extender: Rudene Re in Treatment: 32 Encounter Discharge Information Items Discharge Pain  Level: 0 Discharge Condition: Stable Ambulatory Status: Wheelchair Discharge Destination: Nursing Home Transportation: Other Accompanied By: caregiver Schedule Follow-up Appointment: Yes Medication Reconciliation completed and provided to Patient/Care Yes Kendy Haston: Provided on Clinical Summary of Care: 07/03/2015 Form Type Recipient Paper Patient BM Electronic Signature(s) Signed: 07/03/2015 9:03:30 AM By: Gwenlyn Perking Entered By: Gwenlyn Perking on 07/03/2015 09:03:30 Etchison, Aliz ([id number]) -------------------------------------------------------------------------------- Lower Extremity Assessment Details Patient Name: [name] Date of Service: 07/03/2015 8:00 AM Medical Record Number: [id number] Patient Account Number: [id number] Date of Birth/Sex: [birth year] (82 y.o. Female) Treating RN: Ashok Cordia, Debi Primary Care Physician: Loney Laurence Other Clinician: Referring Physician: Loney Laurence Treating Physician/Extender: Rudene Re in Treatment: 18 Vascular Assessment Pulses: Posterior Tibial Dorsalis Pedis Palpable: [Left:Yes] Extremity colors, hair growth, and conditions: Extremity Color: [Left:Normal] Temperature of Extremity: [Left:Warm] Capillary Refill: [Left:< 3 seconds] Toe Nail Assessment Left: Right: Thick: Yes Discolored: No Deformed: No Improper Length and Hygiene: No Electronic Signature(s) Signed: 07/04/2015 5:53:24 PM By: Alejandro Mulling Entered By: Alejandro Mulling on 07/03/2015 08:22:17 Dewing, Riham ([id number]) -------------------------------------------------------------------------------- Multi Wound Chart Details Patient Name: [name] Date of Service: 07/03/2015 8:00 AM Medical Record Number: [id number] Patient Account Number: [id number] Date of Birth/Sex: 03/01/1928 (82 y.o. Female) Treating RN: Ashok Cordia, Debi Primary Care Physician: Loney Laurence Other Clinician: Referring Physician:  Loney Laurence Treating Physician/Extender: Rudene Re in Treatment: 18 Vital Signs Height(in): 64 Pulse(bpm): 83 Weight(lbs): Blood Pressure 127/97 (mmHg): Body Mass Index(BMI): Temperature(F): 98.2 Respiratory Rate 18 (breaths/min): Photos: [1:No Photos] [3:No Photos] [N/A:N/A] Wound Location: [1:Left Calcaneous] [3:Left Foot - Dorsal] [N/A:N/A] Wounding Event: [1:Pressure Injury] [3:Gradually Appeared] [N/A:N/A] Primary Etiology: [1:Pressure Ulcer] [3:Trauma, Other] [N/A:N/A] Comorbid History: [1:Anemia, Hypertension, Type II Diabetes, History of pressure wounds, Osteoarthritis, Dementia] [3:Anemia, Hypertension, Type II Diabetes, History of pressure wounds, Osteoarthritis, Dementia] [N/A:N/A] Date Acquired: [1:01/30/2015] [3:06/19/2015] [N/A:N/A] Weeks of Treatment: [1:18] [3:2] [N/A:N/A] Wound Status: [1:Open] [3:Open] [N/A:N/A] Measurements L x W x D 2.5x3x0.7 [3:0.3x0.3x0.1] [N/A:N/A] (cm) Area (cm) : [1:5.89] [3:0.071] [N/A:N/A] Volume (cm) : [1:4.123] [3:0.007] [N/A:N/A] % Reduction in Area: [1:38.80%] [3:43.70%] [N/A:N/A] % Reduction in Volume: -328.60% [3:46.20%] [N/A:N/A]  Classification: [1:Unstageable/Unclassified] [3:Partial Thickness] [N/A:N/A] HBO Classification: [1:Grade 1] [3:Grade 1] [N/A:N/A] Exudate Amount: [1:Large] [3:Medium] [N/A:N/A] Exudate Type: [1:Serous] [3:Serous] [N/A:N/A] Exudate Color: [1:amber] [3:amber] [N/A:N/A] Wound Margin: [1:Indistinct, nonvisible] [3:Flat and Intact] [N/A:N/A] Granulation Amount: [1:None Present (0%)] [3:None Present (0%)] [N/A:N/A] Necrotic Amount: [1:Large (67-100%)] [3:Large (67-100%)] [N/A:N/A] Exposed Structures: [1:Fascia: No Fat: No Tendon: No Muscle: No Joint: No] [3:Fascia: No Fat: No Tendon: No Muscle: No Joint: No] [N/A:N/A] Bone: No Bone: No Limited to Skin Limited to Skin Breakdown Breakdown Epithelialization: None None N/A Periwound Skin Texture: Edema: No No Abnormalities Noted  N/A Excoriation: No Induration: No Callus: No Crepitus: No Fluctuance: No Friable: No Rash: No Scarring: No Periwound Skin Maceration: Yes Moist: Yes N/A Moisture: Moist: Yes Dry/Scaly: No Periwound Skin Color: Erythema: Yes No Abnormalities Noted N/A Atrophie Blanche: No Cyanosis: No Ecchymosis: No Hemosiderin Staining: No Mottled: No Pallor: No Rubor: No Erythema Location: Circumferential N/A N/A Temperature: No Abnormality N/A N/A Tenderness on Yes No N/A Palpation: Wound Preparation: Ulcer Cleansing: Ulcer Cleansing: N/A Rinsed/Irrigated with Rinsed/Irrigated with Saline Saline Topical Anesthetic Topical Anesthetic Applied: Other: lidocaine Applied: Other: lidocaine 4% Treatment Notes Electronic Signature(s) Signed: 07/04/2015 5:53:24 PM By: Alejandro Mulling Entered By: Alejandro Mulling on 07/03/2015 08:29:32 CALE, DECAROLIS ([id number]) -------------------------------------------------------------------------------- Multi-Disciplinary Care Plan Details Patient Name: [name] Date of Service: 07/03/2015 8:00 AM Medical Record Number: [id number] Patient Account Number: [id number] Date of Birth/Sex: 1928/04/28 (82 y.o. Female) Treating RN: Ashok Cordia, Debi Primary Care Physician: Loney Laurence Other Clinician: Referring Physician: Loney Laurence Treating Physician/Extender: Rudene Re in Treatment: 18 Active Inactive Orientation to the Wound Care Program Nursing Diagnoses: Knowledge deficit related to the wound healing center program Goals: Patient/caregiver will verbalize understanding of the Wound Healing Center Program Date Initiated: 02/27/2015 Goal Status: Active Interventions: Provide education on orientation to the wound center Notes: Pressure Nursing Diagnoses: Knowledge deficit related to management of pressures ulcers Potential for impaired tissue integrity related to pressure, friction, moisture, and  shear Goals: Patient will remain free from development of additional pressure ulcers Date Initiated: 02/27/2015 Goal Status: Active Patient will remain free of pressure ulcers Date Initiated: 02/27/2015 Goal Status: Active Patient/caregiver will verbalize risk factors for pressure ulcer development Date Initiated: 02/27/2015 Goal Status: Active Patient/caregiver will verbalize understanding of pressure ulcer management Date Initiated: 02/27/2015 Goal Status: Active Interventions: Assess: immobility, friction, shearing, incontinence upon admission and as needed Darrington, Gizelle ([id number]) Assess offloading mechanisms upon admission and as needed Assess potential for pressure ulcer upon admission and as needed Provide education on pressure ulcers Treatment Activities: Patient referred for home evaluation of offloading devices/mattresses : 07/03/2015 Patient referred for pressure reduction/relief devices : 07/03/2015 Notes: Wound/Skin Impairment Nursing Diagnoses: Impaired tissue integrity Knowledge deficit related to ulceration/compromised skin integrity Goals: Patient/caregiver will verbalize understanding of skin care regimen Date Initiated: 02/27/2015 Goal Status: Active Ulcer/skin breakdown will have a volume reduction of 30% by week 4 Date Initiated: 02/27/2015 Goal Status: Active Ulcer/skin breakdown will have a volume reduction of 50% by week 8 Date Initiated: 02/27/2015 Goal Status: Active Ulcer/skin breakdown will have a volume reduction of 80% by week 12 Date Initiated: 02/27/2015 Goal Status: Active Ulcer/skin breakdown will heal within 14 weeks Date Initiated: 02/27/2015 Goal Status: Active Interventions: Assess patient/caregiver ability to obtain necessary supplies Assess patient/caregiver ability to perform ulcer/skin care regimen upon admission and as needed Assess ulceration(s) every visit Provide education on ulcer and skin care Treatment  Activities: Skin care regimen initiated : 07/03/2015 Notes: Electronic Signature(s) BRONWEN, PENDERGRAFT ([id number]) Signed: 07/04/2015 5:53:24  PM By: Alejandro Mulling Entered By: Alejandro Mulling on 07/03/2015 08:29:26 Mcneff, [name] ([id number]) -------------------------------------------------------------------------------- Pain Assessment Details Patient Name: [name] Date of Service: 07/03/2015 8:00 AM Medical Record Number: [id number] Patient Account Number: [id number] Date of Birth/Sex: 16-Mar-1928 (82 y.o. Female) Treating RN: Ashok Cordia, Debi Primary Care Physician: Loney Laurence Other Clinician: Referring Physician: Loney Laurence Treating Physician/Extender: Rudene Re in Treatment: 18 Active Problems Location of Pain Severity and Description of Pain Patient Has Paino No Site Locations Pain Management and Medication Current Pain Management: Electronic Signature(s) Signed: 07/04/2015 5:53:24 PM By: Alejandro Mulling Entered By: Alejandro Mulling on 07/03/2015 08:21:06 Rivero, [name] ([id number]) -------------------------------------------------------------------------------- Patient/Caregiver Education Details Patient Name: [name] Date of Service: 07/03/2015 8:00 AM Medical Record Number: [id number] Patient Account Number: [id number] Date of Birth/Gender: 02-09-1928 (82 y.o. Female) Treating RN: Ashok Cordia, Debi Primary Care Physician: Loney Laurence Other Clinician: Referring Physician: Loney Laurence Treating Physician/Extender: Rudene Re in Treatment: 57 Education Assessment Education Provided To: Patient and Caregiver Education Topics Provided Wound/Skin Impairment: Handouts: Other: change dressing as ordered Methods: Demonstration, Explain/Verbal Responses: State content correctly Electronic Signature(s) Signed: 07/04/2015 5:53:24 PM By: Alejandro Mulling Entered By: Alejandro Mulling on 07/03/2015  08:56:44 Trieu, Arabel ([id number]) -------------------------------------------------------------------------------- Wound Assessment Details Patient Name: [name] Date of Service: 07/03/2015 8:00 AM Medical Record Number: [id number] Patient Account Number: [id number] Date of Birth/Sex: 1928/03/17 (82 y.o. Female) Treating RN: Ashok Cordia, Debi Primary Care Physician: Loney Laurence Other Clinician: Referring Physician: Loney Laurence Treating Physician/Extender: Rudene Re in Treatment: 18 Wound Status Wound Number: 1 Primary Pressure Ulcer Etiology: Wound Location: Left Calcaneous Wound Open Wounding Event: Pressure Injury Status: Date Acquired: 01/30/2015 Comorbid Anemia, Hypertension, Type II Weeks Of Treatment: 18 History: Diabetes, History of pressure wounds, Clustered Wound: No Osteoarthritis, Dementia Photos Photo Uploaded By: Alejandro Mulling on 07/03/2015 16:26:48 Wound Measurements Length: (cm) 2.5 Width: (cm) 3 Depth: (cm) 0.7 Area: (cm) 5.89 Volume: (cm) 4.123 % Reduction in Area: 38.8% % Reduction in Volume: -328.6% Epithelialization: None Tunneling: No Undermining: No Wound Description Classification: Unstageable/Unclassified Foul Od Diabetic Severity (Wagner): Grade 1 Wound Margin: Indistinct, nonvisible Exudate Amount: Large Exudate Type: Serous Exudate Color: amber or After Cleansing: No Wound Bed Granulation Amount: None Present (0%) Exposed Structure Necrotic Amount: Large (67-100%) Fascia Exposed: No Necrotic Quality: Adherent Slough Fat Layer Exposed: No Masini, Zakyra ([id number]) Tendon Exposed: No Muscle Exposed: No Joint Exposed: No Bone Exposed: No Limited to Skin Breakdown Periwound Skin Texture Texture Color No Abnormalities Noted: No No Abnormalities Noted: No Callus: No Atrophie Blanche: No Crepitus: No Cyanosis: No Excoriation: No Ecchymosis: No Fluctuance: No Erythema: Yes Friable:  No Erythema Location: Circumferential Induration: No Hemosiderin Staining: No Localized Edema: No Mottled: No Rash: No Pallor: No Scarring: No Rubor: No Moisture Temperature / Pain No Abnormalities Noted: No Temperature: No Abnormality Dry / Scaly: No Tenderness on Palpation: Yes Maceration: Yes Moist: Yes Wound Preparation Ulcer Cleansing: Rinsed/Irrigated with Saline Topical Anesthetic Applied: Other: lidocaine, Treatment Notes Wound #1 (Left Calcaneous) 1. Cleansed with: Clean wound with Normal Saline 2. Anesthetic Topical Lidocaine 4% cream to wound bed prior to debridement 3. Peri-wound Care: Skin Prep 4. Dressing Applied: Santyl Ointment 5. Secondary Dressing Applied Bordered Foam Dressing Dry Gauze Electronic Signature(s) Signed: 07/04/2015 5:53:24 PM By: Alejandro Mulling Entered By: Alejandro Mulling on 07/03/2015 08:29:20 Shehata, Hunter ([id number]) Mcduffey, Camelia ([id number]) -------------------------------------------------------------------------------- Wound Assessment Details Patient Name: [name] Date of Service: 07/03/2015 8:00 AM Medical Record Number: [id number] Patient Account Number: [id number] Date of Birth/Sex: 14-Sep-1927 (82 y.o. Female) Treating  RN: [name] Haggis Primary Care Physician: Loney Laurence Other Clinician: Referring Physician: Loney Laurence Treating Physician/Extender: Rudene Re in Treatment: 18 Wound Status Wound Number: 3 Primary Trauma, Other Etiology: Wound Location: Left Foot - Dorsal Wound Open Wounding Event: Gradually Appeared Status: Date Acquired: 06/19/2015 Comorbid Anemia, Hypertension, Type II Weeks Of Treatment: 2 History: Diabetes, History of pressure wounds, Clustered Wound: No Osteoarthritis, Dementia Photos Photo Uploaded By: Alejandro Mulling on 07/03/2015 16:26:49 Wound Measurements Length: (cm) 0.3 Width: (cm) 0.3 Depth: (cm) 0.1 Area: (cm) 0.071 Volume: (cm)  0.007 % Reduction in Area: 43.7% % Reduction in Volume: 46.2% Epithelialization: None Tunneling: No Undermining: No Wound Description Classification: Partial Thickness Foul Odor A Diabetic Severity (Wagner): Grade 1 Wound Margin: Flat and Intact Exudate Amount: Medium Exudate Type: Serous Exudate Color: amber fter Cleansing: No Wound Bed Granulation Amount: None Present (0%) Exposed Structure Necrotic Amount: Large (67-100%) Fascia Exposed: No Necrotic Quality: Adherent Slough Fat Layer Exposed: No Basu, Latonia ([id number]) Tendon Exposed: No Muscle Exposed: No Joint Exposed: No Bone Exposed: No Limited to Skin Breakdown Periwound Skin Texture Texture Color No Abnormalities Noted: No No Abnormalities Noted: No Moisture No Abnormalities Noted: No Moist: Yes Wound Preparation Ulcer Cleansing: Rinsed/Irrigated with Saline Topical Anesthetic Applied: Other: lidocaine 4%, Treatment Notes Wound #3 (Left, Dorsal Foot) 1. Cleansed with: Clean wound with Dakins/Anasept 2. Anesthetic Topical Lidocaine 4% cream to wound bed prior to debridement 3. Peri-wound Care: Skin Prep 5. Secondary Dressing Applied Bordered Foam Dressing Electronic Signature(s) Signed: 07/04/2015 5:53:24 PM By: Alejandro Mulling Entered By: Alejandro Mulling on 07/03/2015 08:25:32 Camilli, [name] ([id number]) -------------------------------------------------------------------------------- Vitals Details Patient Name: [name] Date of Service: 07/03/2015 8:00 AM Medical Record Number: [id number] Patient Account Number: [id number] Date of Birth/Sex: [birth year] (82 y.o. Female) Treating RN: Ashok Cordia, Debi Primary Care Physician: Loney Laurence Other Clinician: Referring Physician: Loney Laurence Treating Physician/Extender: Rudene Re in Treatment: 18 Vital Signs Time Taken: 08:21 Temperature (F): 98.2 Height (in): 64 Pulse (bpm): 83 Respiratory Rate  (breaths/min): 18 Blood Pressure (mmHg): 127/97 Reference Range: 80 - 120 mg / dl Electronic Signature(s) Signed: 07/04/2015 5:53:24 PM By: Alejandro Mulling Entered By: Alejandro Mulling on 07/03/2015 08:65:78

## 2015-07-05 NOTE — Progress Notes (Signed)
Gloria, [name] ([id number]) Visit Report for 07/03/2015 Chief Complaint Document Details Patient Name: [name] Date of Service: 07/03/2015 8:00 AM Medical Record Number: [id number] Patient Account Number: [id number] Date of Birth/Sex: 1927-06-11 (82 y.o. Female) Treating RN: Ashok Cordia, Debi Primary Care Physician: Loney Laurence Other Clinician: Referring Physician: Loney Laurence Treating Physician/Extender: Rudene Re in Treatment: 18 Information Obtained from: Patient Chief Complaint patient is back to see [place] today for diabetic foot ulcer which she's had for several months. Electronic Signature(s) Signed: 07/03/2015 8:52:32 AM By: Evlyn Kanner MD, FACS Entered By: Evlyn Kanner on 07/03/2015 08:52:32 [name], [name] ([id number]) -------------------------------------------------------------------------------- Debridement Details Patient Name: [name] Date of Service: 07/03/2015 8:00 AM Medical Record Number: [id number] Patient Account Number: [id number] Date of Birth/Sex: April 13, 1928 (82 y.o. Female) Treating RN: Ashok Cordia, Debi Primary Care Physician: Loney Laurence Other Clinician: Referring Physician: Loney Laurence Treating Physician/Extender: Rudene Re in Treatment: 18 Debridement Performed for Wound #1 Left Calcaneous Assessment: Performed By: Physician Evlyn Kanner, MD Debridement: Debridement Pre-procedure Yes Verification/Time Out Taken: Start Time: 08:41 Pain Control: Other : lidocaine 4% cream Level: Skin/Subcutaneous Tissue Total Area Debrided (L x 2.5 (cm) x 3 (cm) = 7.5 (cm) W): Tissue and other Viable, Non-Viable, Exudate, Fibrin/Slough, Subcutaneous material debrided: Instrument: Curette Bleeding: Minimum Hemostasis Achieved: Pressure End Time: 08:45 Procedural Pain: 0 Post Procedural Pain: 0 Response to Treatment: Procedure was tolerated well Post Debridement Measurements of Total  Wound Length: (cm) 2.5 Stage: Unstageable/Unclassified Width: (cm) 3 Depth: (cm) 0.8 Volume: (cm) 4.712 Post Procedure Diagnosis Same as Pre-procedure Electronic Signature(s) Signed: 07/03/2015 8:52:26 AM By: Evlyn Kanner MD, FACS Signed: 07/04/2015 5:53:24 PM By: Alejandro Mulling Entered By: Evlyn Kanner on 07/03/2015 08:52:26 [name], [name] ([id number]) -------------------------------------------------------------------------------- HPI Details Patient Name: [name] Date of Service: 07/03/2015 8:00 AM Medical Record Number: [id number] Patient Account Number: [id number] Date of Birth/Sex: 1928/02/03 (82 y.o. Female) Treating RN: Ashok Cordia, Debi Primary Care Physician: Loney Laurence Other Clinician: Referring Physician: Loney Laurence Treating Physician/Extender: Rudene Re in Treatment: 18 History of Present Illness Location: left heel and dorsum of the foot Quality: Patient reports No Pain. Severity: Patient states wound (s) are getting better. Duration: the patient has had surgery in early December and had vascular testing ordered but not done. Timing: Pain in wound is Intermittent (comes and goes Context: The wound appeared gradually over time Modifying Factors: Other treatment(s) tried include: local dressings and offloading. surgery done by Dr. Graciela Husbands for debridement of the left heel wound Associated Signs and Symptoms: Patient reports having difficulty standing for long periods. HPI Description: 82 year old patient with past medical history of Alzheimer's dementia, diabetes mellitus, hypertension and recent history of right hip arthroplasty in August 2016. Her past medical history is significant also for hypothyroidism, hypertension, urinary retention, dementia. 04/11/2015 -- she has had a lot of pain and left heel and there has been a lot of drainage today with purulent material. He has not been running fever. 04/18/2015 -- last week she was  here with a florid infection of her left heel and I had sent her to the hospital for admission. She was admitted between 12/ 2 and 12/ 5 and she underwent a debridement of the left heel abscess with placement of antibiotic beads with vancomycin, by Dr. Linus Galas. Her cultures grew gram- negative bacteria, was given vancomycin and Unasyn IV in hospital and she was continued on oral antibiotics and was given documented for 10 days. at the time of discharge Dr. Alberteen Spindle wanted the dressing to be intact for about  a week before it would need changing. 05/29/2014 -- she has not been to see [place] for the last 6 weeks and from what I understand she has been in rehabilitation facility. No vascular testing was done as we had requested. She has a diabetic foot ulcer which was graded as a Wagner 3 when she was seen last about a month and a half ago. There is no family member to discuss hyperbaric oxygen therapy and the patient has as Alzheimer's dementia and I do not believe she understands our discussions 06/12/2015 -- a vascular appointment is pending for tomorrow. 06/19/2015 -- the vascular workup revealed that the left lower extremity had a 50-99% stenosis in the left superficial femoral, popliteal and tibioperoneal trunk. 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 Dr. Wyn Quaker has recommended angiographically and possible stent placement for this critical ischemia. 07/03/2015 -- he is going to have a stent placement done by Dr. Wyn Quaker this coming Monday. Electronic Signature(s) Signed: 07/03/2015 8:52:57 AM By: Evlyn Kanner MD, FACS Entered By: Evlyn Kanner on 07/03/2015 08:52:57 [name] ([id number]) -------------------------------------------------------------------------------- Physical Exam Details Patient Name: [name] Date of Service: 07/03/2015 8:00 AM Medical Record Number: [id number] Patient Account Number: [id number] Date of Birth/Sex: 03/15/1928 (82 y.o.  Female) Treating RN: Ashok Cordia, Debi Primary Care Physician: Loney Laurence Other Clinician: Referring Physician: Loney Laurence Treating Physician/Extender: Rudene Re in Treatment: 18 Constitutional . Pulse regular. Respirations normal and unlabored. Afebrile. . Eyes Nonicteric. Reactive to light. Ears, Nose, Mouth, and Throat Lips, teeth, and gums WNL.Marland Kitchen Moist mucosa without lesions. Neck supple and nontender. No palpable supraclavicular or cervical adenopathy. Normal sized without goiter. Respiratory WNL. No retractions.. Cardiovascular Pedal Pulses WNL. No clubbing, cyanosis or edema. Lymphatic No adneopathy. No adenopathy. No adenopathy. Musculoskeletal Adexa without tenderness or enlargement.. Digits and nails w/o clubbing, cyanosis, infection, petechiae, ischemia, or inflammatory conditions.. Integumentary (Hair, Skin) No suspicious lesions. No crepitus or fluctuance. No peri-wound warmth or erythema. No masses.Marland Kitchen Psychiatric Judgement and insight Intact.. No evidence of depression, anxiety, or agitation.. Notes the dorsum of the left ankle looks clean and there is a very minute area open. The left heel has significant amount of subcutaneous debris which has been treated with Santyl ointment. Sharp debridement was done with a curette and a large amount of debris removed and under that there is healthy granulation tissue. Bleeding was controlled with pressure. Electronic Signature(s) Signed: 07/03/2015 8:53:53 AM By: Evlyn Kanner MD, FACS Entered By: Evlyn Kanner on 07/03/2015 08:53:53 [name], [name] ([id number]) -------------------------------------------------------------------------------- Physician Orders Details Patient Name: [name] Date of Service: 07/03/2015 8:00 AM Medical Record Number: [id number] Patient Account Number: [id number] Date of Birth/Sex: [birth year] (82 y.o. Female) Treating RN: Ashok Cordia, Debi Primary Care Physician:  Loney Laurence Other Clinician: Referring Physician: Loney Laurence Treating Physician/Extender: Rudene Re in Treatment: 2 Verbal / Phone Orders: Yes Clinician: Ashok Cordia, Debi Read Back and Verified: Yes Diagnosis Coding Wound Cleansing Wound #1 Left Calcaneous o Clean wound with Normal Saline. o May Shower, gently pat wound dry prior to applying new dressing. Wound #3 Left,Dorsal Foot o Clean wound with Normal Saline. Anesthetic Wound #1 Left Calcaneous o Topical Lidocaine 4% cream applied to wound bed prior to debridement Wound #3 Left,Dorsal Foot o Topical Lidocaine 4% cream applied to wound bed prior to debridement Skin Barriers/Peri-Wound Care Wound #1 Left Calcaneous o Skin Prep Wound #3 Left,Dorsal Foot o Skin Prep Primary Wound Dressing Wound #1 Left Calcaneous o Santyl Ointment Secondary Dressing Wound #1 Left Calcaneous o  Dry Gauze o Boardered Foam Dressing Wound #3 Left,Dorsal Foot o Boardered Foam Dressing Dressing Change Frequency Wound #1 Left Calcaneous [name], Gloria ([id number]) o Change dressing every other day. - for home health purposes Wound #3 Left,Dorsal Foot o Change dressing every other day. - for home health purposes Follow-up Appointments Wound #1 Left Calcaneous o Return Appointment in 1 week. Wound #3 Left,Dorsal Foot o Return Appointment in 1 week. Off-Loading Wound #1 Left Calcaneous o Heel suspension boot to: - sage boots PEG ASSIST SHOE o Turn and reposition every 2 hours o Other: - FLOAT HEELS WHEN LYING IN BED Wound #3 Left,Dorsal Foot o Heel suspension boot to: - sage boots PEG ASSIST SHOE o Turn and reposition every 2 hours o Other: - FLOAT HEELS WHEN LYING IN BED Home Health Wound #1 Left Calcaneous o Continue Home Health Visits - [facility] *****PLEASE CHANGE YOUR DAY TO MONDAYS******* PT IS BEING SEEN IN THE CLINIC ON THURSDAYS NOW******* o Home  Health Nurse may visit PRN to address patientos wound care needs. o FACE TO FACE ENCOUNTER: MEDICARE and MEDICAID PATIENTS: I certify that this patient is under my care and that I had a face-to-face encounter that meets the physician face-to-face encounter requirements with this patient on this date. The encounter with the patient was in whole or in part for the following MEDICAL CONDITION: (primary reason for Home Healthcare) MEDICAL NECESSITY: I certify, that based on my findings, NURSING services are a medically necessary home health service. HOME BOUND STATUS: I certify that my clinical findings support that this patient is homebound (i.e., Due to illness or injury, pt requires aid of supportive devices such as crutches, cane, wheelchairs, walkers, the use of special transportation or the assistance of another person to leave their place of residence. There is a normal inability to leave the home and doing so requires considerable and taxing effort. Other absences are for medical reasons / religious services and are infrequent or of short duration when for other reasons). o If current dressing causes regression in wound condition, may D/C ordered dressing product/s and apply Normal Saline Moist Dressing daily until next Wound Healing Center / Other MD appointment. Notify Wound Healing Center of regression in wound condition at [phone]. o Please direct any NON-WOUND related issues/requests for orders to patient's Primary Care Physician CARLINDA, OHLSON ([id number]) Wound #3 Left,Dorsal Foot o Continue Home Health Visits - [facility] o Home Health Nurse may visit PRN to address patientos wound care needs. o FACE TO FACE ENCOUNTER: MEDICARE and MEDICAID PATIENTS: I certify that this patient is under my care and that I had a face-to-face encounter that meets the physician face-to-face encounter requirements with this patient on this date. The encounter with the patient was  in whole or in part for the following MEDICAL CONDITION: (primary reason for Home Healthcare) MEDICAL NECESSITY: I certify, that based on my findings, NURSING services are a medically necessary home health service. HOME BOUND STATUS: I certify that my clinical findings support that this patient is homebound (i.e., Due to illness or injury, pt requires aid of supportive devices such as crutches, cane, wheelchairs, walkers, the use of special transportation or the assistance of another person to leave their place of residence. There is a normal inability to leave the home and doing so requires considerable and taxing effort. Other absences are for medical reasons / religious services and are infrequent or of short duration when for other reasons). o If current dressing causes regression in wound condition, may D/C  ordered dressing product/s and apply Normal Saline Moist Dressing daily until next Wound Healing Center / Other MD appointment. Notify Wound Healing Center of regression in wound condition at [phone]. o Please direct any NON-WOUND related issues/requests for orders to patient's Primary Care Physician Electronic Signature(s) Signed: 07/03/2015 1:24:37 PM By: Evlyn Kanner MD, FACS Signed: 07/04/2015 5:53:24 PM By: Alejandro Mulling Entered By: Alejandro Mulling on 07/03/2015 08:46:28 [name], [name] ([id number]) -------------------------------------------------------------------------------- Problem List Details Patient Name: [name] Date of Service: 07/03/2015 8:00 AM Medical Record Number: [id number] Patient Account Number: [id number] Date of Birth/Sex: 1927-09-13 (82 y.o. 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 Inactive Problems Resolved Problems ICD-10 Code Description Active Date Resolved Date L89.521 Pressure ulcer of left ankle, stage 1 02/27/2015 02/27/2015 L03.116 Cellulitis of left lower limb 04/11/2015 04/11/2015 Electronic Signature(s) Signed: 07/03/2015 8:52:15 AM By: Evlyn Kanner MD, FACS Entered By: Evlyn Kanner on 07/03/2015 08:52:15 [name], [name] ([id number]) -------------------------------------------------------------------------------- Progress Note Details Patient Name: [name] Date of Service: 07/03/2015 8:00 AM Medical Record Number: [id number] Patient Account Number: [id number] Date of Birth/Sex: Jan 08, 1928 (82 y.o. Female) Treating RN: Ashok Cordia, Debi Primary Care Physician: Loney Laurence Other Clinician: Referring Physician: Loney Laurence Treating Physician/Extender: Rudene Re in Treatment: 18 Subjective Chief Complaint Information obtained from Patient patient is back to see [place] today for diabetic foot ulcer which she's had for several months. History of Present Illness (HPI) The following HPI elements were documented for the patient's wound: Location: left heel and dorsum of the foot Quality: Patient reports No Pain. Severity: Patient states wound (s) are getting better. Duration: the patient has had surgery in early December and had vascular testing ordered but not done. Timing: Pain in wound is Intermittent (comes and goes Context: The wound appeared gradually over time Modifying Factors: Other treatment(s) tried include: local dressings and offloading. surgery done by Dr. Graciela Husbands for debridement of the left heel wound Associated Signs and Symptoms: Patient reports having  difficulty standing for long periods. 82 year old patient with past medical history of Alzheimer's dementia, diabetes mellitus, hypertension and recent history of right hip arthroplasty in August 2016. Her past medical history is significant also for hypothyroidism, hypertension, urinary retention, dementia. 04/11/2015 -- she has had a lot of pain and left heel and there has been a lot of drainage today with purulent material. He has not been running fever. 04/18/2015 -- last week she was here with a florid infection of her left heel and I had sent her to the hospital for admission. She was admitted between 12/ 2 and 12/ 5 and she underwent a debridement of the left heel abscess with placement of antibiotic beads with vancomycin, by Dr. Linus Galas. Her cultures grew gram- negative bacteria, was given vancomycin and Unasyn IV in hospital and she was continued on oral antibiotics and was given documented for 10 days. at the time of discharge Dr. Alberteen Spindle wanted the dressing to be intact for about a week before it would need changing. 05/29/2014 -- she has not been to see [place] for the last 6 weeks and from what I understand she has been in rehabilitation facility. No  vascular testing was done as we had requested. She has a diabetic foot ulcer which was graded as a Wagner 3 when she was seen last about a month and a half ago. There is no family member to discuss hyperbaric oxygen therapy and the patient has as Alzheimer's dementia and I do not believe she understands our discussions 06/12/2015 -- a vascular appointment is pending for tomorrow. 06/19/2015 -- the vascular workup revealed that the left lower extremity had a 50-99% stenosis in the left superficial femoral, popliteal and tibioperoneal trunk. 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 Dr. Wyn Quaker has recommended angiographically and possible stent placement for this critical ischemia. Gloria, [name] ([id number]) 07/03/2015  -- he is going to have a stent placement done by Dr. Wyn Quaker this coming Monday. Objective Constitutional Pulse regular. Respirations normal and unlabored. Afebrile. Vitals Time Taken: 8:21 AM, Height: 64 in, Temperature: 98.2 F, Pulse: 83 bpm, Respiratory Rate: 18 breaths/min, Blood Pressure: 127/97 mmHg. Eyes Nonicteric. Reactive to light. Ears, Nose, Mouth, and Throat Lips, teeth, and gums WNL.Marland Kitchen Moist mucosa without lesions. Neck supple and nontender. No palpable supraclavicular or cervical adenopathy. Normal sized without goiter. Respiratory WNL. No retractions.. Cardiovascular Pedal Pulses WNL. No clubbing, cyanosis or edema. Lymphatic No adneopathy. No adenopathy. No adenopathy. Musculoskeletal Adexa without tenderness or enlargement.. Digits and nails w/o clubbing, cyanosis, infection, petechiae, ischemia, or inflammatory conditions.Marland Kitchen Psychiatric Judgement and insight Intact.. No evidence of depression, anxiety, or agitation.. General Notes: the dorsum of the left ankle looks clean and there is a very minute area open. The left heel has significant amount of subcutaneous debris which has been treated with Santyl ointment. Sharp debridement was done with a curette and a large amount of debris removed and under that there is healthy granulation tissue. Bleeding was controlled with pressure. Integumentary (Hair, Skin) No suspicious lesions. No crepitus or fluctuance. No peri-wound warmth or erythema. No masses.Marland Kitchen [name], Gloria ([id number]) Wound #1 status is Open. Original cause of wound was Pressure Injury. The wound is located on the Left Calcaneous. The wound measures 2.5cm length x 3cm width x 0.7cm depth; 5.89cm^2 area and 4.123cm^3 volume. The wound is limited to skin breakdown. There is no tunneling or undermining noted. There is a large amount of serous drainage noted. The wound margin is indistinct and nonvisible. There is no granulation within the wound bed. There is a  large (67-100%) amount of necrotic tissue within the wound bed including Adherent Slough. The periwound skin appearance exhibited: Maceration, Moist, Erythema. The periwound skin appearance did not exhibit: Callus, Crepitus, Excoriation, Fluctuance, Friable, Induration, Localized Edema, Rash, Scarring, Dry/Scaly, Atrophie Blanche, Cyanosis, Ecchymosis, Hemosiderin Staining, Mottled, Pallor, Rubor. The surrounding wound skin color is noted with erythema which is circumferential. Periwound temperature was noted as No Abnormality. The periwound has tenderness on palpation. Wound #3 status is Open. Original cause of wound was Gradually Appeared. The wound is located on the Left,Dorsal Foot. The wound measures 0.3cm length x 0.3cm width x 0.1cm depth; 0.071cm^2 area and 0.007cm^3 volume. The wound is limited to skin breakdown. There is no tunneling or undermining noted. There is a medium amount of serous drainage noted. The wound margin is flat and intact. There is no granulation within the wound bed. There is a large (67-100%) amount of necrotic tissue within the wound bed including Adherent Slough. The periwound skin appearance exhibited: Moist. Assessment Active Problems ICD-10 E11.621 - Type 2 diabetes mellitus with foot ulcer L89.620 - Pressure ulcer of left heel, unstageable  G30.9 - Alzheimer's disease, unspecified L97.422 - Non-pressure chronic ulcer of left heel and midfoot with fat layer exposed I have recommended Santyl ointment to the left heel to be changed daily and on the left ankle on the dorsum Apply a bordered foam. She will have surgery this coming Monday and see as back next Thursday for review. Procedures Wound #1 Wound #1 is a Pressure Ulcer located on the Left Calcaneous . There was a Skin/Subcutaneous Tissue Debridement ([id number]) debridement with total area of 7.5 sq cm performed by Evlyn Kanner, MD. with [name] ([id number]) the following instrument(s):  Curette to remove Viable and Non-Viable tissue/material including Exudate, Fibrin/Slough, and Subcutaneous after achieving pain control using Other (lidocaine 4% cream). A time out was conducted prior to the start of the procedure. A Minimum amount of bleeding was controlled with Pressure. The procedure was tolerated well with a pain level of 0 throughout and a pain level of 0 following the procedure. Post Debridement Measurements: 2.5cm length x 3cm width x 0.8cm depth; 4.712cm^3 volume. Post debridement Stage noted as Unstageable/Unclassified. Post procedure Diagnosis Wound #1: Same as Pre-Procedure Plan Wound Cleansing: Wound #1 Left Calcaneous: Clean wound with Normal Saline. May Shower, gently pat wound dry prior to applying new dressing. Wound #3 Left,Dorsal Foot: Clean wound with Normal Saline. Anesthetic: Wound #1 Left Calcaneous: Topical Lidocaine 4% cream applied to wound bed prior to debridement Wound #3 Left,Dorsal Foot: Topical Lidocaine 4% cream applied to wound bed prior to debridement Skin Barriers/Peri-Wound Care: Wound #1 Left Calcaneous: Skin Prep Wound #3 Left,Dorsal Foot: Skin Prep Primary Wound Dressing: Wound #1 Left Calcaneous: Santyl Ointment Secondary Dressing: Wound #1 Left Calcaneous: Dry Gauze Boardered Foam Dressing Wound #3 Left,Dorsal Foot: Boardered Foam Dressing Dressing Change Frequency: Wound #1 Left Calcaneous: Change dressing every other day. - for home health purposes Wound #3 Left,Dorsal Foot: Change dressing every other day. - for home health purposes Follow-up Appointments: Wound #1 Left Calcaneous: Return Appointment in 1 week. Wound #3 Left,Dorsal Foot: Return Appointment in 1 week. [name], Gloria ([id number]) Off-Loading: Wound #1 Left Calcaneous: Heel suspension boot to: - sage boots PEG ASSIST SHOE Turn and reposition every 2 hours Other: - FLOAT HEELS WHEN LYING IN BED Wound #3 Left,Dorsal Foot: Heel suspension boot  to: - sage boots PEG ASSIST SHOE Turn and reposition every 2 hours Other: - FLOAT HEELS WHEN LYING IN BED Home Health: Wound #1 Left Calcaneous: Continue Home Health Visits - [facility] *****PLEASE CHANGE YOUR DAY TO MONDAYS******* PT IS BEING SEEN IN THE CLINIC ON THURSDAYS NOW******* Home Health Nurse may visit PRN to address patient s wound care needs. FACE TO FACE ENCOUNTER: MEDICARE and MEDICAID PATIENTS: I certify that this patient is under my care and that I had a face-to-face encounter that meets the physician face-to-face encounter requirements with this patient on this date. The encounter with the patient was in whole or in part for the following MEDICAL CONDITION: (primary reason for Home Healthcare) MEDICAL NECESSITY: I certify, that based on my findings, NURSING services are a medically necessary home health service. HOME BOUND STATUS: I certify that my clinical findings support that this patient is homebound (i.e., Due to illness or injury, pt requires aid of supportive devices such as crutches, cane, wheelchairs, walkers, the use of special transportation or the assistance of another person to leave their place of residence. There is a normal inability to leave the home and doing so requires considerable and taxing effort. Other absences are for medical reasons /  religious services and are infrequent or of short duration when for other reasons). If current dressing causes regression in wound condition, may D/C ordered dressing product/s and apply Normal Saline Moist Dressing daily until next Wound Healing Center / Other MD appointment. Notify Wound Healing Center of regression in wound condition at [phone]. Please direct any NON-WOUND related issues/requests for orders to patient's Primary Care Physician Wound #3 Left,Dorsal Foot: Continue Home Health Visits - [facility] Nurse may visit PRN to address patient s wound care needs. FACE TO FACE ENCOUNTER: MEDICARE  and MEDICAID PATIENTS: I certify that this patient is under my care and that I had a face-to-face encounter that meets the physician face-to-face encounter requirements with this patient on this date. The encounter with the patient was in whole or in part for the following MEDICAL CONDITION: (primary reason for Home Healthcare) MEDICAL NECESSITY: I certify, that based on my findings, NURSING services are a medically necessary home health service. HOME BOUND STATUS: I certify that my clinical findings support that this patient is homebound (i.e., Due to illness or injury, pt requires aid of supportive devices such as crutches, cane, wheelchairs, walkers, the use of special transportation or the assistance of another person to leave their place of residence. There is a normal inability to leave the home and doing so requires considerable and taxing effort. Other absences are for medical reasons / religious services and are infrequent or of short duration when for other reasons). If current dressing causes regression in wound condition, may D/C ordered dressing product/s and apply Normal Saline Moist Dressing daily until next Wound Healing Center / Other MD appointment. Notify Wound Healing Center of regression in wound condition at [phone]. Please direct any NON-WOUND related issues/requests for orders to patient's Primary Care Physician Gloria, [name] ([id number]) I have recommended Santyl ointment to the left heel to be changed daily and on the left ankle on the dorsum Apply a bordered foam. She will have surgery this coming Monday and see as back next Thursday for review. Electronic Signature(s) Signed: 07/03/2015 8:54:41 AM By: Evlyn Kanner MD, FACS Entered By: Evlyn Kanner on 07/03/2015 08:54:40 [name], [name] ([id number]) -------------------------------------------------------------------------------- SuperBill Details Patient Name: [name] Date of Service:  07/03/2015 Medical Record Number: [id number] Patient Account Number: [id number] Date of Birth/Sex: [birth year] (82 y.o. Female) Treating RN: Ashok Cordia, Debi Primary Care Physician: Loney Laurence Other Clinician: Referring Physician: Loney Laurence Treating Physician/Extender: Rudene Re in Treatment: 18 Diagnosis Coding ICD-10 Codes Code Description E11.621 Type 2 diabetes mellitus with foot ulcer L89.620 Pressure ulcer of left heel, unstageable G30.9 Alzheimer's disease, unspecified L97.422 Non-pressure chronic ulcer of left heel and midfoot with fat layer exposed Facility Procedures CPT4 Code Description: 16010932 11042 - DEB SUBQ TISSUE 20 SQ CM/< ICD-10 Description Diagnosis E11.621 Type 2 diabetes mellitus with foot ulcer L89.620 Pressure ulcer of left heel, unstageable G30.9 Alzheimer's disease, unspecified L97.422 Non-pressure  chronic ulcer of left heel and midfoot Modifier: with fat laye Quantity: 1 r exposed Physician Procedures CPT4 Code Description: 3557322 11042 - WC PHYS SUBQ TISS 20 SQ CM ICD-10 Description Diagnosis E11.621 Type 2 diabetes mellitus with foot ulcer L89.620 Pressure ulcer of left heel, unstageable G30.9 Alzheimer's disease, unspecified L97.422 Non-pressure  chronic ulcer of left heel and midfoot Modifier: with fat laye Quantity: 1 r exposed Electronic Signature(s) Signed: 07/03/2015 8:55:07 AM By: Evlyn Kanner MD, FACS Entered By: Evlyn Kanner on 07/03/2015 08:55:07

## 2015-07-07 ENCOUNTER — Ambulatory Visit
Admission: RE | Admit: 2015-07-07 | Discharge: 2015-07-07 | Disposition: A | Discharge status: Home / Self Care | Payer: Medicare Other | Source: Ambulatory Visit | Attending: Vascular Surgery | Admitting: Vascular Surgery

## 2015-07-07 ENCOUNTER — Encounter
Admission: RE | Disposition: A | Discharge status: Home / Self Care | Payer: Self-pay | Source: Ambulatory Visit | Attending: Vascular Surgery

## 2015-07-07 DIAGNOSIS — G309 Alzheimer's disease, unspecified: Secondary | ICD-10-CM | POA: Diagnosis not present

## 2015-07-07 DIAGNOSIS — E1152 Type 2 diabetes mellitus with diabetic peripheral angiopathy with gangrene: Secondary | ICD-10-CM | POA: Diagnosis not present

## 2015-07-07 DIAGNOSIS — I10 Essential (primary) hypertension: Secondary | ICD-10-CM | POA: Diagnosis not present

## 2015-07-07 DIAGNOSIS — Z794 Long term (current) use of insulin: Secondary | ICD-10-CM | POA: Diagnosis not present

## 2015-07-07 DIAGNOSIS — L97529 Non-pressure chronic ulcer of other part of left foot with unspecified severity: Secondary | ICD-10-CM | POA: Diagnosis not present

## 2015-07-07 DIAGNOSIS — Z7984 Long term (current) use of oral hypoglycemic drugs: Secondary | ICD-10-CM | POA: Insufficient documentation

## 2015-07-07 DIAGNOSIS — I70268 Atherosclerosis of native arteries of extremities with gangrene, other extremity: Secondary | ICD-10-CM | POA: Insufficient documentation

## 2015-07-07 DIAGNOSIS — E039 Hypothyroidism, unspecified: Secondary | ICD-10-CM | POA: Insufficient documentation

## 2015-07-07 DIAGNOSIS — Z7982 Long term (current) use of aspirin: Secondary | ICD-10-CM | POA: Insufficient documentation

## 2015-07-07 DIAGNOSIS — F0280 Dementia in other diseases classified elsewhere without behavioral disturbance: Secondary | ICD-10-CM | POA: Diagnosis not present

## 2015-07-07 HISTORY — PX: PERIPHERAL VASCULAR CATHETERIZATION: SHX172C

## 2015-07-07 SURGERY — LOWER EXTREMITY ANGIOGRAPHY
Anesthesia: Moderate Sedation | Laterality: Left

## 2015-07-07 MED ORDER — DEXTROSE 5 % IV SOLN
1.5000 g | INTRAVENOUS | Status: AC
  Administered 2015-07-07: 1.5 g via INTRAVENOUS

## 2015-07-07 MED ORDER — HYDROMORPHONE HCL 1 MG/ML IJ SOLN: 1.0000 mg | Freq: Once | INTRAMUSCULAR | Status: DC

## 2015-07-07 MED ORDER — LIDOCAINE-EPINEPHRINE (PF) 1 %-1:200000 IJ SOLN
INTRAMUSCULAR | Status: DC | PRN
  Administered 2015-07-07: 10 mL via INTRADERMAL

## 2015-07-07 MED ORDER — FENTANYL CITRATE (PF) 100 MCG/2ML IJ SOLN
INTRAMUSCULAR | Status: AC
  Filled 2015-07-07: qty 2

## 2015-07-07 MED ORDER — FAMOTIDINE 20 MG PO TABS: 40.0000 mg | ORAL_TABLET | ORAL | Status: DC | PRN

## 2015-07-07 MED ORDER — LABETALOL HCL 5 MG/ML IV SOLN: 10.0000 mg | INTRAVENOUS | Status: DC | PRN

## 2015-07-07 MED ORDER — MIDAZOLAM HCL 2 MG/2ML IJ SOLN
INTRAMUSCULAR | Status: DC | PRN
  Administered 2015-07-07: 1 mg via INTRAVENOUS

## 2015-07-07 MED ORDER — FENTANYL CITRATE (PF) 100 MCG/2ML IJ SOLN
INTRAMUSCULAR | Status: DC | PRN
  Administered 2015-07-07 (×2): 25 ug via INTRAVENOUS

## 2015-07-07 MED ORDER — DIPHENHYDRAMINE HCL 50 MG/ML IJ SOLN
INTRAMUSCULAR | Status: AC
  Filled 2015-07-07: qty 1

## 2015-07-07 MED ORDER — HEPARIN SODIUM (PORCINE) 1000 UNIT/ML IJ SOLN
INTRAMUSCULAR | Status: DC | PRN
  Administered 2015-07-07: 4000 [IU] via INTRAVENOUS

## 2015-07-07 MED ORDER — HEPARIN SODIUM (PORCINE) 1000 UNIT/ML IJ SOLN
INTRAMUSCULAR | Status: AC
  Filled 2015-07-07: qty 1

## 2015-07-07 MED ORDER — HEPARIN (PORCINE) IN NACL 2-0.9 UNIT/ML-% IJ SOLN
INTRAMUSCULAR | Status: AC
  Filled 2015-07-07: qty 1000

## 2015-07-07 MED ORDER — HYDROMORPHONE HCL 1 MG/ML IJ SOLN
INTRAMUSCULAR | Status: AC
  Filled 2015-07-07: qty 1

## 2015-07-07 MED ORDER — DIPHENHYDRAMINE HCL 50 MG/ML IJ SOLN
INTRAMUSCULAR | Status: DC | PRN
Start: 2015-07-07 — End: 2015-07-07
  Administered 2015-07-07: 25 mg via INTRAVENOUS

## 2015-07-07 MED ORDER — OXYCODONE-ACETAMINOPHEN 5-325 MG PO TABS: 1.0000 | ORAL_TABLET | ORAL | Status: DC | PRN

## 2015-07-07 MED ORDER — METOPROLOL TARTRATE 1 MG/ML IV SOLN: 2.0000 mg | INTRAVENOUS | Status: DC | PRN

## 2015-07-07 MED ORDER — SODIUM CHLORIDE 0.9 % IV SOLN: 500.0000 mL | Freq: Once | INTRAVENOUS | Status: DC | PRN

## 2015-07-07 MED ORDER — ONDANSETRON HCL 4 MG/2ML IJ SOLN: 4.0000 mg | Freq: Four times a day (QID) | INTRAMUSCULAR | Status: DC | PRN

## 2015-07-07 MED ORDER — ACETAMINOPHEN 325 MG PO TABS: 325.0000 mg | ORAL_TABLET | ORAL | Status: DC | PRN

## 2015-07-07 MED ORDER — HYDRALAZINE HCL 20 MG/ML IJ SOLN
INTRAMUSCULAR | Status: DC | PRN
  Administered 2015-07-07: 20 mg via INTRAVENOUS

## 2015-07-07 MED ORDER — HYDROMORPHONE HCL 1 MG/ML IJ SOLN
0.5000 mg | INTRAMUSCULAR | Status: DC | PRN
  Administered 2015-07-07: 1 mg via INTRAVENOUS

## 2015-07-07 MED ORDER — HYDRALAZINE HCL 20 MG/ML IJ SOLN: 5.0000 mg | INTRAMUSCULAR | Status: DC | PRN

## 2015-07-07 MED ORDER — IOHEXOL 300 MG/ML  SOLN
INTRAMUSCULAR | Status: DC | PRN
  Administered 2015-07-07: 65 mL via INTRA_ARTERIAL

## 2015-07-07 MED ORDER — METHYLPREDNISOLONE SODIUM SUCC 125 MG IJ SOLR: 125.0000 mg | INTRAMUSCULAR | Status: DC | PRN

## 2015-07-07 MED ORDER — LIDOCAINE-EPINEPHRINE (PF) 1 %-1:200000 IJ SOLN
INTRAMUSCULAR | Status: AC
  Filled 2015-07-07: qty 30

## 2015-07-07 MED ORDER — PHENOL 1.4 % MT LIQD: 1.0000 | OROMUCOSAL | Status: DC | PRN

## 2015-07-07 MED ORDER — MIDAZOLAM HCL 5 MG/5ML IJ SOLN
INTRAMUSCULAR | Status: AC
  Filled 2015-07-07: qty 5

## 2015-07-07 MED ORDER — GUAIFENESIN-DM 100-10 MG/5ML PO SYRP: 15.0000 mL | ORAL_SOLUTION | ORAL | Status: DC | PRN

## 2015-07-07 MED ORDER — SODIUM CHLORIDE 0.9 % IV SOLN: INTRAVENOUS | Status: DC

## 2015-07-07 MED ORDER — HYDRALAZINE HCL 20 MG/ML IJ SOLN
INTRAMUSCULAR | Status: AC
  Filled 2015-07-07: qty 1

## 2015-07-07 MED ORDER — ONDANSETRON HCL 4 MG/2ML IJ SOLN
INTRAMUSCULAR | Status: AC
  Filled 2015-07-07: qty 2

## 2015-07-07 MED ORDER — MIDAZOLAM HCL 2 MG/ML PO SYRP
5.0000 mg | ORAL_SOLUTION | Freq: Once | ORAL | Status: DC
  Filled 2015-07-07: qty 4

## 2015-07-07 MED ORDER — ACETAMINOPHEN 325 MG RE SUPP: 325.0000 mg | RECTAL | Status: DC | PRN

## 2015-07-07 SURGICAL SUPPLY — 16 items
BALLN LUTONIX 5X120X130 (BALLOONS) ×3
BALLN ULTRVRSE 2.5X300X150 (BALLOONS) ×3
BALLN ULTRVRSE 3X100X150 (BALLOONS) ×3
BALLOON LUTONIX 5X120X130 (BALLOONS) ×1 IMPLANT
BALLOON ULTRVRSE 2.5X300X150 (BALLOONS) ×1 IMPLANT
BALLOON ULTRVRSE 3X100X150 (BALLOONS) ×1 IMPLANT
CATH PIG 70CM (CATHETERS) ×3 IMPLANT
CATH VERT 100CM (CATHETERS) ×3 IMPLANT
DEVICE STARCLOSE SE CLOSURE (Vascular Products) ×3 IMPLANT
GLIDEWIRE ADV .035X260CM (WIRE) ×3 IMPLANT
PACK ANGIOGRAPHY (CUSTOM PROCEDURE TRAY) ×3 IMPLANT
SHEATH BRITE TIP 5FRX11 (SHEATH) ×3 IMPLANT
SHEATH HIGHFLEX ANSEL 6FRX55 (SHEATH) ×3 IMPLANT
SYR MEDRAD MARK V 150ML (SYRINGE) ×3 IMPLANT
TUBING CONTRAST HIGH PRESS 72 (TUBING) ×3 IMPLANT
WIRE J 3MM .035X145CM (WIRE) ×3 IMPLANT

## 2015-07-07 NOTE — H&P (Signed)
  [place] [facility] History & Physical Update  The patient was interviewed and re-examined.  The patient's previous History and Physical has been reviewed and is unchanged.  There is no change in the plan of care. We plan to proceed with the scheduled procedure.  DEW,JASON, MD  07/07/2015, 11:41 AM

## 2015-07-07 NOTE — Op Note (Signed)
[place] [facility] Percutaneous Study/Intervention Procedural Note   Date of Surgery: 07/07/2015  Surgeon(s):DEW,JASON   Assistants:none  Pre-operative Diagnosis: PAD with gangrene of the left foot  Post-operative diagnosis: Same  Procedure(s) Performed: 1. Ultrasound guidance for vascular access right femoral artery 2. Catheter placement into the left anterior tibial artery from right femoral approach 3. Aortogram and selective left lower extremity angiogram 4. Percutaneous transluminal angioplasty of the distal and mid left anterior tibial artery with 2.5 mm diameter by 30 cm length angioplasty balloon 5. Percutaneous transluminal angioplasty of the proximal left anterior tibial artery with 3 mm diameter by 10 cm length angioplasty balloon  6.  Percutaneous transluminal angioplasty of distal SFA and proximal popliteal artery with 5 mm diameter by 12 cm length Lutonix drug-coated angioplasty balloon 7. StarClose closure device right femoral artery  EBL: Minimal  Contrast: 65 cc  Fluro Time: 4.2 minutes  Moderate Conscious Sedation Time: approximately 40 minutes using 1 mg of Versed and 50 mcg of Fentanyl  Indications: Patient is a 82 y.o.female with gangrenous changes on toes on the left foot and nonpalpable pedal pulses. The patient is brought in for angiography for further evaluation and potential treatment. Risks and benefits are discussed and informed consent is obtained  Procedure: The patient was identified and appropriate procedural time out was performed. The patient was then placed supine on the table and prepped and draped in the usual sterile fashion.Moderate conscious sedation was administered during a face to face encounter with the patient throughout the procedure with my supervision of the RN administering medicines and monitoring the patient's vital  signs, pulse oximetry, telemetry and mental status throughout from the start of the procedure until the patient was taken to the recovery room. Ultrasound was used to evaluate the right common femoral artery. It was patent . A digital ultrasound image was acquired. A Seldinger needle was used to access the right common femoral artery under direct ultrasound guidance and a permanent image was performed. A 0.035 J wire was advanced without resistance and a 5Fr sheath was placed. Pigtail catheter was placed into the aorta and an AP aortogram was performed. This demonstrated normal renal arteries and normal aorta and iliac segments without significant stenosis. I then crossed the aortic bifurcation and advanced to the left femoral head. Selective left lower extremity angiogram was then performed. This demonstrated diffusely diseased SFA with a high-grade near occlusive stenosis in the distal SFA just above Hunter's canal. One-vessel runoff being only the anterior tibial artery but the proximal anterior tibial artery has a short segment occlusion in the distal anterior tibial artery has moderate to high-grade disease over about 10-12 cm.  . The patient was systemically heparinized and a 6 [place] Ansell sheath was then placed over the [employer] wire. I then used a Kumpe catheter and the advantage wire to navigate through the SFA and popliteal lesion and confirm intraluminal flow in the below-knee popliteal artery. I then exchanged for a 0.018 wire and crossed the anterior tibial artery occlusion in the distal anterior tibial artery stenosis without difficulty with the help of a Kumpe catheter confirming intraluminal flow in the anterior tibial artery. I treated the anterior tibial artery from the foot to the proximal to mid anterior tibial artery with a 2.5 mm diameter by 30 cm length angioplasty balloon inflated to 8 atm for 1 minute. The proximal anterior tibial artery was then treated with a 3 mm diameter  by 10 cm length angioplasty balloon inflated to  10 atm for 1 minute. Following angioplasty, there was less than 20% residual stenosis in these areas with brisk flow into the foot. I then turned my attention to the SFA lesion. A 5 mm diameter by 12 cm length Lutonix drug-coated angioplasty balloon was inflated to 12 atm for 1 minute. Completion and gram showed about a 15% residual stenosis after angioplasty that was not flow limiting. I elected to terminate the procedure. The sheath was removed and StarClose closure device was deployed in the left femoral artery with excellent hemostatic result. The patient was taken to the recovery room in stable condition having tolerated the procedure well.  Findings:  Aortogram: normal aorta and iliac arteries.  Renal arteries without stenosis Left Lower Extremity: Diffusely diseased SFA with a high-grade near occlusive stenosis in the distal SFA just above Hunter's canal. One-vessel runoff being only the anterior tibial artery but the proximal anterior tibial artery has a short segment occlusion in the distal anterior tibial artery has moderate to high-grade disease over about 10-12 cm.    Disposition: Patient was taken to the recovery room in stable condition having tolerated the procedure well.  Complications: None  DEW,JASON 07/07/2015 1:39 PM

## 2015-07-07 NOTE — Discharge Instructions (Signed)
Angiogram, Care After °Refer to this sheet in the next few weeks. These instructions provide you with information about caring for yourself after your procedure. Your health care provider may also give you more specific instructions. Your treatment has been planned according to current medical practices, but problems sometimes occur. Call your health care provider if you have any problems or questions after your procedure. °WHAT TO EXPECT AFTER THE PROCEDURE °After your procedure, it is typical to have the following: °· Bruising at the catheter insertion site that usually fades within 1-2 weeks. °· Blood collecting in the tissue (hematoma) that may be painful to the touch. It should usually decrease in size and tenderness within 1-2 weeks. °HOME CARE INSTRUCTIONS °· Take medicines only as directed by your health care provider. °· You may shower 24-48 hours after the procedure or as directed by your health care provider. Remove the bandage (dressing) and gently wash the site with plain soap and water. Pat the area dry with a clean towel. Do not rub the site, because this may cause bleeding. °· Do not take baths, swim, or use a hot tub until your health care provider approves. °· Check your insertion site every day for redness, swelling, or drainage. °· Do not apply powder or lotion to the site. °· Do not lift over 10 lb (4.5 kg) for 5 days after your procedure or as directed by your health care provider. °· Ask your health care provider when it is okay to: °¨ Return to work or school. °¨ Resume usual physical activities or sports. °¨ Resume sexual activity. °· Do not drive home if you are discharged the same day as the procedure. Have someone else drive you. °· You may drive 24 hours after the procedure unless otherwise instructed by your health care provider. °· Do not operate machinery or power tools for 24 hours after the procedure or as directed by your health care provider. °· If your procedure was done as an  outpatient procedure, which means that you went home the same day as your procedure, a responsible adult should be with you for the first 24 hours after you arrive home. °· Keep all follow-up visits as directed by your health care provider. This is important. °SEEK MEDICAL CARE IF: °· You have a fever. °· You have chills. °· You have increased bleeding from the catheter insertion site. Hold pressure on the site. °SEEK IMMEDIATE MEDICAL CARE IF: °· You have unusual pain at the catheter insertion site. °· You have redness, warmth, or swelling at the catheter insertion site. °· You have drainage (other than a small amount of blood on the dressing) from the catheter insertion site. °· The catheter insertion site is bleeding, and the bleeding does not stop after 30 minutes of holding steady pressure on the site. °· The area near or just beyond the catheter insertion site becomes pale, cool, tingly, or numb. °  °This information is not intended to replace advice given to you by your health care provider. Make sure you discuss any questions you have with your health care provider. °  °Document Released: 11/12/2004 Document Revised: 05/17/2014 Document Reviewed: 09/27/2012 °Elsevier Interactive Patient Education ©2016 [employer] ° °

## 2015-07-08 ENCOUNTER — Encounter: Payer: Self-pay | Admitting: Vascular Surgery

## 2015-07-10 NOTE — H&P (Signed)
[facility] Admission History & Physical  MRN : [id number]  [name] is a 82 y.o. (26-Mar-1928) female who presents with chief complaint of No chief complaint on file. Marland Kitchen  History of Present Illness: Patient is an 82 yo patient with dementia who has a non-healing wound of the left foot for several months.  This is painful.  She is a poor historian and does not now how it started or exactly when it started.  She has no fever or chills or signs of systemic infection.  No current facility-administered medications for this encounter.   Current Outpatient Prescriptions  Medication Sig Dispense Refill  . acetaminophen (TYLENOL) 325 MG tablet Take 325 mg by mouth every 6 (six) hours as needed for mild pain or moderate pain.    Marland Kitchen amoxicillin-clavulanate (AUGMENTIN) 875-125 MG tablet Take 1 tablet by mouth 2 (two) times daily.    Marland Kitchen aspirin EC 81 MG tablet Take 81 mg by mouth daily.    . feeding supplement, GLUCERNA SHAKE, (GLUCERNA SHAKE) LIQD Take 237 mLs by mouth 3 (three) times daily with meals. 1 Can 0  . ferrous sulfate 325 (65 FE) MG tablet Take 1 tablet (325 mg total) by mouth 2 (two) times daily with a meal. (Patient taking differently: Take 325 mg by mouth 3 (three) times daily before meals. ) 60 tablet 0  . insulin aspart (NOVOLOG) 100 UNIT/ML injection Inject into the skin 3 (three) times daily as needed for high blood sugar (per sliding scale).    . insulin detemir (LEVEMIR) 100 UNIT/ML injection Inject 0.25 mLs (25 Units total) into the skin at bedtime. (Patient taking differently: Inject 20 Units into the skin at bedtime. ) 10 mL 0  . levothyroxine (SYNTHROID, LEVOTHROID) 75 MCG tablet Take 75 mcg by mouth daily.    Marland Kitchen lisinopril (PRINIVIL,ZESTRIL) 5 MG tablet Take 5 mg by mouth daily.    . mirtazapine (REMERON) 15 MG tablet Take 15 mg by mouth at bedtime.    . polyethylene glycol (MIRALAX / GLYCOLAX) packet Take 17 g by mouth daily as needed for mild  constipation. 14 each 0  . rOPINIRole (REQUIP) 1 MG tablet Take 1 tablet (1 mg total) by mouth at bedtime. 30 tablet 0  . senna-docusate (SENOKOT-S) 8.6-50 MG tablet Take 2 tablets by mouth 2 (two) times daily.    . sertraline (ZOLOFT) 50 MG tablet Take 50 mg by mouth daily.    . simvastatin (ZOCOR) 20 MG tablet Take 20 mg by mouth every evening.    . tamsulosin (FLOMAX) 0.4 MG CAPS capsule Take 1 capsule (0.4 mg total) by mouth daily. 30 capsule 3  . traZODone (DESYREL) 50 MG tablet Take 50 mg by mouth at bedtime.    Marland Kitchen aspirin EC 325 MG tablet Take 325 mg by mouth daily. Reported on 07/07/2015    . HYDROcodone-acetaminophen (NORCO/VICODIN) 5-325 MG tablet Take 1 tablet by mouth every 4 (four) hours as needed for moderate pain. (Patient not taking: Reported on 07/07/2015) 30 tablet 0  . metFORMIN (GLUCOPHAGE) 500 MG tablet Take 500 mg by mouth daily. Reported on 07/07/2015      Past Medical History  Diagnosis Date  . Alzheimer's dementia   . Diabetes type 2, controlled (HCC)   . Hypothyroidism   . Vision impairment   . Hearing impairment   . Hypertension   . Unsteady gait     Past Surgical History  Procedure Laterality Date  . No past surgeries    .  Hip arthroplasty Right 01/08/2015    Procedure: ARTHROPLASTY BIPOLAR HIP (HEMIARTHROPLASTY);  Surgeon: [name], MD;  Location: [facility] ORS;  Service: Orthopedics;  Laterality: Right;  . Irrigation and debridement foot Left 04/12/2015    Procedure: IRRIGATION AND DEBRIDEMENT FOOT;  Surgeon: Linus Galas, MD;  Location: [facility] ORS;  Service: Podiatry;  Laterality: Left;  . Peripheral vascular catheterization Left 07/07/2015    Procedure: Lower Extremity Angiography;  Surgeon: Annice Needy, MD;  Location: [facility] INVASIVE CV LAB;  Service: Cardiovascular;  Laterality: Left;    Social History Social History  Substance Use Topics  . Smoking status: Never Smoker   . Smokeless tobacco: Not on file  . Alcohol Use: No   no IV drug use  Family  History Family History  Problem Relation Age of Onset  . Family history unknown: Yes   no documented family history of bleeding disorders, clotting disorders, or aneurysms but difficult to obtain from the patient from her mental status being poor  No Known Allergies   REVIEW OF SYSTEMS (Negative unless checked)  Constitutional: Weight loss  Fever  Chills Cardiac: Chest pain   Chest pressure   Palpitations   Shortness of breath when laying flat   Shortness of breath at rest   Shortness of breath with exertion. Vascular:  Pain in legs with walking   Pain in legs at rest   Pain in legs when laying flat   Claudication   Pain in feet when walking  Pain in feet at rest  Pain in feet when laying flat   History of DVT   Phlebitis   Swelling in legs   Varicose veins   Non-healing ulcers Pulmonary:   Uses home oxygen   Productive cough   Hemoptysis   Wheeze  COPD   Asthma Neurologic:  Dizziness  Blackouts   Seizures   History of stroke   History of TIA  Aphasia   Temporary blindness   Dysphagia   Weakness or numbness in arms   Weakness or numbness in legs Musculoskeletal:  Arthritis   Joint swelling   Joint pain   Low back pain Hematologic:  Easy bruising  Easy bleeding   Hypercoagulable state   Anemic  Hepatitis Gastrointestinal:  Blood in stool   Vomiting blood  Gastroesophageal reflux/heartburn   Difficulty swallowing. Genitourinary:  Chronic kidney disease   Difficult urination  Frequent urination  Burning with urination   Blood in urine Skin:  Rashes   Ulcers   Wounds Psychological:  History of anxiety    History of major depression. Although ROS difficult to obtain due to patient's mental status.   Physical Examination  Filed Vitals:   07/07/15 1424 07/07/15 1438 07/07/15 1501 07/07/15 1539  BP: 116/60 122/56 114/62 104/57  Pulse: 90 88 91 91  Temp:      TempSrc:       Resp: Height:      Weight:      SpO2: 90% 89% 91% 93%   Body mass index is 21.58 kg/(m^2). Gen: WD/WN, NAD Head: [place]/AT, No temporalis wasting. Prominent temp pulse not noted. Ear/Nose/Throat: Hearing grossly intact, nares w/o erythema or drainage, oropharynx w/o Erythema/Exudate,  Eyes: PERRLA, EOMI.  Neck: Supple, no nuchal rigidity.  No JVD.  Pulmonary:  Good air movement, no use of accessory muscles.  Cardiac: irregularly irregular Vascular:  Vessel Right Left  Radial Palpable Palpable  Ulnar Palpable Palpable  Brachial Palpable Palpable  Carotid Palpable, without bruit Palpable,  without bruit  Aorta Not palpable N/A  Femoral Palpable Palpable  Popliteal Not Palpable Not Palpable  PT Not Palpable Not Palpable  DP Palpable Not Palpable   Gastrointestinal: soft, non-tender/non-distended. No guarding/reflex.  Musculoskeletal: M/S 5/5 throughout.  Extremities without ischemic changes.  No deformity or atrophy.  Neurologic: CN 2-12 intact. Pain and light touch intact in extremities.  Symmetrical.  Speech is fluent. Motor exam as listed above. Psychiatric: Judgment intact, Mood & affect appropriate for pt's clinical situation. Dermatologic: open wounds on the left foot with mild surrounding erythema Lymph : No Cervical, Axillary, or Inguinal lymphadenopathy.      CBC Lab Results  Component Value Date   WBC 5.9 04/14/2015   HGB 10.2* 04/14/2015   HCT 31.0* 04/14/2015   MCV 81.8 04/14/2015   PLT 281 04/14/2015    BMET    Component Value Date/Time   NA 135 04/13/2015 0551   NA 140 10/01/2011 0827   K 4.3 04/13/2015 0551   K 4.0 10/01/2011 0827   CL 104 04/13/2015 0551   CL 102 10/01/2011 0827   CO2 27 04/13/2015 0551   CO2 30 10/01/2011 0827   GLUCOSE 161* 04/13/2015 0551   GLUCOSE 75 10/01/2011 0827   BUN 13 07/04/2015 1458   BUN 20* 10/01/2011 0827   CREATININE 0.71 07/04/2015 1458   CREATININE 0.78 10/01/2011 0827   CALCIUM 8.3* 04/13/2015  0551   CALCIUM 9.0 10/01/2011 0827   GFRNONAA >60 07/04/2015 1458   GFRNONAA >60 10/01/2011 0827   GFRAA >60 07/04/2015 1458   GFRAA >60 10/01/2011 0827   Estimated Creatinine Clearance: 40.7 mL/min (by C-G formula based on Cr of 0.71).  COAG Lab Results  Component Value Date   INR 0.99 01/07/2015    Radiology No results found.    Assessment/Plan 1. PAD with ulceration left lower extremity. This is clearly a limb threatening and worrisome situation. Angiogram is pain performed in hopes of improving perfusion to allow limb salvage. Risks and benefits were discussed and the patient's daughter contacted as an outpatient prior to the procedure. 2. Dementia. Difficult situation to get patient to avoid pressure as well 3. DM. Atherosclerotic risk factor and impacts wound healing.   Kassie Keng, MD  07/10/2015 10:36 AM

## 2015-07-11 ENCOUNTER — Encounter: Payer: Medicare Other | Attending: Surgery | Admitting: Surgery

## 2015-07-11 DIAGNOSIS — G309 Alzheimer's disease, unspecified: Secondary | ICD-10-CM | POA: Diagnosis not present

## 2015-07-11 DIAGNOSIS — M1990 Unspecified osteoarthritis, unspecified site: Secondary | ICD-10-CM | POA: Diagnosis not present

## 2015-07-11 DIAGNOSIS — I10 Essential (primary) hypertension: Secondary | ICD-10-CM | POA: Insufficient documentation

## 2015-07-11 DIAGNOSIS — E11621 Type 2 diabetes mellitus with foot ulcer: Secondary | ICD-10-CM | POA: Insufficient documentation

## 2015-07-11 DIAGNOSIS — L89620 Pressure ulcer of left heel, unstageable: Secondary | ICD-10-CM | POA: Insufficient documentation

## 2015-07-11 DIAGNOSIS — E039 Hypothyroidism, unspecified: Secondary | ICD-10-CM | POA: Insufficient documentation

## 2015-07-11 DIAGNOSIS — L97422 Non-pressure chronic ulcer of left heel and midfoot with fat layer exposed: Secondary | ICD-10-CM | POA: Insufficient documentation

## 2015-07-12 NOTE — Progress Notes (Addendum)
[name] ([id number]) Visit Report for 07/11/2015 Chief Complaint Document Details Patient Name: [name], [name] Date of Service: 07/11/2015 8:45 AM Medical Record Number: [id number] Patient Account Number: [id number] Date of Birth/Sex: 07/06/1927 (82 y.o. Female) Treating RN: Ashok Cordia, Debi Primary Care Physician: Loney Laurence Other Clinician: Referring Physician: Loney Laurence Treating Physician/Extender: Rudene Re in Treatment: 31 Information Obtained from: Patient Chief Complaint patient is back to see [place] today for diabetic foot ulcer which she's had for several months. Electronic Signature(s) Signed: 07/11/2015 9:43:51 AM By: Evlyn Kanner MD, FACS Entered By: Evlyn Kanner on 07/11/2015 09:43:51 [name] ([id number]) -------------------------------------------------------------------------------- Debridement Details Patient Name: [name] Date of Service: 07/11/2015 8:45 AM Medical Record Number: [id number] Patient Account Number: [id number] Date of Birth/Sex: 07-Feb-1928 (82 y.o. Female) Treating RN: Ashok Cordia, Debi Primary Care Physician: Loney Laurence Other Clinician: Referring Physician: Loney Laurence Treating Physician/Extender: Rudene Re in Treatment: 19 Debridement Performed for Wound #1 Left Calcaneous Assessment: Performed By: Physician Evlyn Kanner, MD Debridement: Debridement Pre-procedure Yes Verification/Time Out Taken: Start Time: 09:34 Pain Control: Other : lidocaine 4% cream Level: Skin/Subcutaneous Tissue Total Area Debrided (L x 2 (cm) x 2.5 (cm) = 5 (cm) W): Tissue and other Viable, Non-Viable, Eschar, Exudate, Fibrin/Slough, Subcutaneous material debrided: Instrument: Curette Bleeding: Minimum Hemostasis Achieved: Pressure End Time: 09:38 Procedural Pain: 0 Post Procedural Pain: 0 Response to Treatment: Procedure was tolerated well Post Debridement Measurements of Total  Wound Length: (cm) 2 Stage: Unstageable/Unclassified Width: (cm) 2.5 Depth: (cm) 0.5 Volume: (cm) 1.963 Post Procedure Diagnosis Same as Pre-procedure Electronic Signature(s) Signed: 07/11/2015 9:43:42 AM By: Evlyn Kanner MD, FACS Signed: 07/15/2015 5:37:49 PM By: Alejandro Mulling Entered By: Evlyn Kanner on 07/11/2015 09:43:42 [name], [name] ([id number]) -------------------------------------------------------------------------------- HPI Details Patient Name: [name] Date of Service: 07/11/2015 8:45 AM Medical Record Number: [id number] Patient Account Number: [id number] Date of Birth/Sex: [birth year] (82 y.o. Female) Treating RN: Ashok Cordia, Debi Primary Care Physician: Loney Laurence Other Clinician: Referring Physician: Loney Laurence Treating Physician/Extender: Rudene Re in Treatment: 19 History of Present Illness Location: left heel and dorsum of the foot Quality: Patient reports No Pain. Severity: Patient states wound (s) are getting better. Duration: the patient has had surgery in early December and had vascular testing ordered but not done. Timing: Pain in wound is Intermittent (comes and goes Context: The wound appeared gradually over time Modifying Factors: Other treatment(s) tried include: local dressings and offloading. surgery done by Dr. Graciela Husbands for debridement of the left heel wound Associated Signs and Symptoms: Patient reports having difficulty standing for long periods. HPI Description: 82 year old patient with past medical history of Alzheimer's dementia, diabetes mellitus, hypertension and recent history of right hip arthroplasty in August 2016. Her past medical history is significant also for hypothyroidism, hypertension, urinary retention, dementia. 04/11/2015 -- she has had a lot of pain and left heel and there has been a lot of drainage today with purulent material. He has not been running fever. 04/18/2015 -- last week she was  here with a florid infection of her left heel and I had sent her to the hospital for admission. She was admitted between 12/ 2 and 12/ 5 and she underwent a debridement of the left heel abscess with placement of antibiotic beads with vancomycin, by Dr. Linus Galas. Her cultures grew gram- negative bacteria, was given vancomycin and Unasyn IV in hospital and she was continued on oral antibiotics and was given documented for 10 days. at the time of discharge Dr. Alberteen Spindle wanted the dressing to be intact for  about a week before it would need changing. 05/29/2014 -- she has not been to see [place] for the last 6 weeks and from what I understand she has been in rehabilitation facility. No vascular testing was done as we had requested. She has a diabetic foot ulcer which was graded as a Wagner 3 when she was seen last about a month and a half ago. There is no family member to discuss hyperbaric oxygen therapy and the patient has as Alzheimer's dementia and I do not believe she understands our discussions 06/12/2015 -- a vascular appointment is pending for tomorrow. 06/19/2015 -- the vascular workup revealed that the left lower extremity had a 50-99% stenosis in the left superficial femoral, popliteal and tibioperoneal trunk. 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 Dr. Wyn Quaker has recommended angiographically and possible stent placement for this critical ischemia. 07/03/2015 -- he is going to have a stent placement done by Dr. Wyn Quaker this coming Monday. 07/11/2015 -- she was taken for an aortogram and found to have diffuse disease SFA with a high-grade near occlusive stenosis in the distal SFA just above Hunter's canal. One-vessel runoff being the wound area and to get tibial artery at the proximal anterior tibial artery had a short segment occlusion in the distal anterior tibial artery and a moderate to high-grade disease of about 10-12 cm. she had a percutaneous angioplasty of the distal and  middle left anterior tibial artery, proximal left anterior tibial artery and distal SFA and proximal popliteal artery. [name] ([id number]) Electronic Signature(s) Signed: 07/11/2015 9:44:03 AM By: Evlyn Kanner MD, FACS Previous Signature: 07/11/2015 8:53:41 AM Version By: Evlyn Kanner MD, FACS Entered By: Evlyn Kanner on 07/11/2015 09:44:03 [name], [name] ([id number]) -------------------------------------------------------------------------------- Physical Exam Details Patient Name: [name] Date of Service: 07/11/2015 8:45 AM Medical Record Number: [id number] Patient Account Number: [id number] Date of Birth/Sex: 09-24-1927 (82 y.o. Female) Treating RN: Ashok Cordia, Debi Primary Care Physician: Loney Laurence Other Clinician: Referring Physician: Loney Laurence Treating Physician/Extender: Rudene Re in Treatment: 19 Constitutional . Pulse regular. Respirations normal and unlabored. Afebrile. . Eyes Nonicteric. Reactive to light. Ears, Nose, Mouth, and Throat Lips, teeth, and gums WNL.Marland Kitchen Moist mucosa without lesions. Neck supple and nontender. No palpable supraclavicular or cervical adenopathy. Normal sized without goiter. Respiratory WNL. No retractions.. Cardiovascular Pedal Pulses WNL. No clubbing, cyanosis or edema. Lymphatic No adneopathy. No adenopathy. No adenopathy. Musculoskeletal Adexa without tenderness or enlargement.. Digits and nails w/o clubbing, cyanosis, infection, petechiae, ischemia, or inflammatory conditions.. Integumentary (Hair, Skin) No suspicious lesions. No crepitus or fluctuance. No peri-wound warmth or erythema. No masses.Marland Kitchen Psychiatric Judgement and insight Intact.. No evidence of depression, anxiety, or agitation.. Notes the dorsum of her left foot looks very good. There was a large quantity of subcutaneous debris and thickened edges on the left heel which I have sharply debrided with a curet down to healthy  granulation tissue which bleeds briskly and controlled with pressure. Electronic Signature(s) Signed: 07/11/2015 9:44:48 AM By: Evlyn Kanner MD, FACS Entered By: Evlyn Kanner on 07/11/2015 09:44:48 Levels, [name] ([id number]) -------------------------------------------------------------------------------- Physician Orders Details Patient Name: [name] Date of Service: 07/11/2015 8:45 AM Medical Record Number: [id number] Patient Account Number: [id number] Date of Birth/Sex: [birth year] (82 y.o. Female) Treating RN: Ashok Cordia, Debi Primary Care Physician: Loney Laurence Other Clinician: Referring Physician: Loney Laurence Treating Physician/Extender: Rudene Re in Treatment: 40 Verbal / Phone Orders: Yes Clinician: Ashok Cordia, Debi Read Back and Verified: Yes Diagnosis Coding Wound Cleansing Wound #1 Left Calcaneous o Clean  wound with Normal Saline. o May Shower, gently pat wound dry prior to applying new dressing. Wound #3 Left,Dorsal Foot o Clean wound with Normal Saline. Anesthetic Wound #1 Left Calcaneous o Topical Lidocaine 4% cream applied to wound bed prior to debridement Wound #3 Left,Dorsal Foot o Topical Lidocaine 4% cream applied to wound bed prior to debridement Skin Barriers/Peri-Wound Care Wound #1 Left Calcaneous o Skin Prep Wound #3 Left,Dorsal Foot o Skin Prep Primary Wound Dressing Wound #1 Left Calcaneous o Santyl Ointment Wound #3 Left,Dorsal Foot o Prisma Ag - moisten with normal saline Secondary Dressing Wound #1 Left Calcaneous o Dry Gauze o Boardered Foam Dressing Wound #3 Left,Dorsal Foot o Dry Gauze [place], Denelle ([id number]) o Boardered Foam Dressing Dressing Change Frequency Wound #1 Left Calcaneous o Change dressing every other day. - for home health purposes Wound #3 Left,Dorsal Foot o Change dressing every other day. - for home health purposes Follow-up Appointments Wound #1  Left Calcaneous o Return Appointment in 1 week. Wound #3 Left,Dorsal Foot o Return Appointment in 1 week. Off-Loading Wound #1 Left Calcaneous o Heel suspension boot to: - sage boots PEG ASSIST SHOE o Turn and reposition every 2 hours o Other: - FLOAT HEELS WHEN LYING IN BED Wound #3 Left,Dorsal Foot o Heel suspension boot to: - sage boots PEG ASSIST SHOE o Turn and reposition every 2 hours o Other: - FLOAT HEELS WHEN LYING IN BED Home Health Wound #1 Left Calcaneous o Continue Home Health Visits - [facility] *****PLEASE CHANGE YOUR DAY TO MONDAYS******* PT IS BEING SEEN IN THE CLINIC ON THURSDAYS NOW******* o Home Health Nurse may visit PRN to address patientos wound care needs. o FACE TO FACE ENCOUNTER: MEDICARE and MEDICAID PATIENTS: I certify that this patient is under my care and that I had a face-to-face encounter that meets the physician face-to-face encounter requirements with this patient on this date. The encounter with the patient was in whole or in part for the following MEDICAL CONDITION: (primary reason for Home Healthcare) MEDICAL NECESSITY: I certify, that based on my findings, NURSING services are a medically necessary home health service. HOME BOUND STATUS: I certify that my clinical findings support that this patient is homebound (i.e., Due to illness or injury, pt requires aid of supportive devices such as crutches, cane, wheelchairs, walkers, the use of special transportation or the assistance of another person to leave their place of residence. There is a normal inability to leave the home and doing so requires considerable and taxing effort. Other absences are for medical reasons / religious services and are infrequent or of short duration when for other reasons). [place], [place] ([id number]) o If current dressing causes regression in wound condition, may D/C ordered dressing product/s and apply Normal Saline Moist Dressing daily until  next Wound Healing Center / Other MD appointment. Notify Wound Healing Center of regression in wound condition at [phone]. o Please direct any NON-WOUND related issues/requests for orders to patient's Primary Care Physician Wound #3 Left,Dorsal Foot o Continue Home Health Visits - [facility] o Home Health Nurse may visit PRN to address patientos wound care needs. o FACE TO FACE ENCOUNTER: MEDICARE and MEDICAID PATIENTS: I certify that this patient is under my care and that I had a face-to-face encounter that meets the physician face-to-face encounter requirements with this patient on this date. The encounter with the patient was in whole or in part for the following MEDICAL CONDITION: (primary reason for Home Healthcare) MEDICAL NECESSITY: I certify, that based on my  findings, NURSING services are a medically necessary home health service. HOME BOUND STATUS: I certify that my clinical findings support that this patient is homebound (i.e., Due to illness or injury, pt requires aid of supportive devices such as crutches, cane, wheelchairs, walkers, the use of special transportation or the assistance of another person to leave their place of residence. There is a normal inability to leave the home and doing so requires considerable and taxing effort. Other absences are for medical reasons / religious services and are infrequent or of short duration when for other reasons). o If current dressing causes regression in wound condition, may D/C ordered dressing product/s and apply Normal Saline Moist Dressing daily until next Wound Healing Center / Other MD appointment. Notify Wound Healing Center of regression in wound condition at [phone]. o Please direct any NON-WOUND related issues/requests for orders to patient's Primary Care Physician Electronic Signature(s) Signed: 07/11/2015 4:29:10 PM By: Evlyn Kanner MD, FACS Signed: 07/15/2015 5:37:49 PM By: Alejandro Mulling Entered  By: Alejandro Mulling on 07/11/2015 09:41:07 Rieke, Darleny ([id number]) -------------------------------------------------------------------------------- Problem List Details Patient Name: [name] Date of Service: 07/11/2015 8:45 AM Medical Record Number: [id number] Patient Account Number: [id number] Date of Birth/Sex: December 10, 1927 (82 y.o. 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 Inactive Problems Resolved Problems ICD-10 Code Description Active Date Resolved Date L89.521 Pressure ulcer of left ankle, stage 1 02/27/2015 02/27/2015 L03.116 Cellulitis of left lower limb 04/11/2015 04/11/2015 Electronic Signature(s) Signed: 07/11/2015 9:43:25 AM By: Evlyn Kanner MD, FACS Entered By: Evlyn Kanner on 07/11/2015 09:43:25 [name], [name] ([id number]) -------------------------------------------------------------------------------- Progress Note Details Patient Name: [name] Date of Service: 07/11/2015 8:45 AM Medical Record Number: [id number] Patient Account Number: [id number] Date of Birth/Sex: 22-Jan-1928 (82 y.o. Female) Treating RN: Ashok Cordia, Debi Primary Care Physician: Loney Laurence Other Clinician: Referring Physician: Loney Laurence Treating Physician/Extender: Rudene Re in Treatment: 19 Subjective Chief Complaint Information obtained from Patient patient is back to see [place] today for diabetic foot ulcer which she's had for several  months. History of Present Illness (HPI) The following HPI elements were documented for the patient's wound: Location: left heel and dorsum of the foot Quality: Patient reports No Pain. Severity: Patient states wound (s) are getting better. Duration: the patient has had surgery in early December and had vascular testing ordered but not done. Timing: Pain in wound is Intermittent (comes and goes Context: The wound appeared gradually over time Modifying Factors: Other treatment(s) tried include: local dressings and offloading. surgery done by Dr. Graciela Husbands for debridement of the left heel wound Associated Signs and Symptoms: Patient reports having difficulty standing for long periods. 82 year old patient with past medical history of Alzheimer's dementia, diabetes mellitus, hypertension and recent history of right hip arthroplasty in August 2016. Her past medical history is significant also for hypothyroidism, hypertension, urinary retention, dementia. 04/11/2015 -- she has had a lot of pain and left heel and there has been a lot of drainage today with purulent material. He has not been running fever. 04/18/2015 -- last week she was here with a florid infection of her left heel and I had sent her to the hospital  for admission. She was admitted between 12/ 2 and 12/ 5 and she underwent a debridement of the left heel abscess with placement of antibiotic beads with vancomycin, by Dr. Linus Galasodd Cline. Her cultures grew gram- negative bacteria, was given vancomycin and Unasyn IV in hospital and she was continued on oral antibiotics and was given documented for 10 days. at the time of discharge Dr. Alberteen Spindleline wanted the dressing to be intact for about a week before it would need changing. 05/29/2014 -- she has not been to see us for the last 6 weeks and from what I understand she has been in rehabilitation facility. No vascular testing was done as we had requested. She has a diabetic foot ulcer which was graded as  a Wagner 3 when she was seen last about a month and a half ago. There is no family member to discuss hyperbaric oxygen therapy and the patient has as Alzheimer's dementia and I do not believe she understands our discussions 06/12/2015 -- a vascular appointment is pending for tomorrow. 06/19/2015 -- the vascular workup revealed that the left lower extremity had a 50-99% stenosis in the left superficial femoral, popliteal and tibioperoneal trunk. 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 Dr. Wyn Quakerew has recommended angiographically and possible stent placement for this critical ischemia. Gloria FateMORDECAI, [name] ([id number]) 07/03/2015 -- he is going to have a stent placement done by Dr. Wyn Quakerew this coming Monday. 07/11/2015 -- she was taken for an aortogram and found to have diffuse disease SFA with a high-grade near occlusive stenosis in the distal SFA just above Hunter's canal. One-vessel runoff being the wound area and to get tibial artery at the proximal anterior tibial artery had a short segment occlusion in the distal anterior tibial artery and a moderate to high-grade disease of about 10-12 cm. she had a percutaneous angioplasty of the distal and middle left anterior tibial artery, proximal left anterior tibial artery and distal SFA and proximal popliteal artery. Objective Constitutional Pulse regular. Respirations normal and unlabored. Afebrile. Vitals Time Taken: 9:05 AM, Height: 64 in, Temperature: 98.1 F, Pulse: 81 bpm, Respiratory Rate: 18 breaths/min, Blood Pressure: 144/62 mmHg. Eyes Nonicteric. Reactive to light. Ears, Nose, Mouth, and Throat Lips, teeth, and gums WNL.Marland Kitchen. Moist mucosa without lesions. Neck supple and nontender. No palpable supraclavicular or cervical adenopathy. Normal sized without goiter. Respiratory WNL. No retractions.. Cardiovascular Pedal Pulses WNL. No clubbing, cyanosis or edema. Lymphatic No adneopathy. No adenopathy. No  adenopathy. Musculoskeletal Adexa without tenderness or enlargement.. Digits and nails w/o clubbing, cyanosis, infection, petechiae, ischemia, or inflammatory conditions.Marland Kitchen. Psychiatric Judgement and insight Intact.. No evidence of depression, anxiety, or agitation.. General Notes: the dorsum of her left foot looks very good. There was a large quantity of subcutaneous [name], [name] ([id number]) debris and thickened edges on the left heel which I have sharply debrided with a curet down to healthy granulation tissue which bleeds briskly and controlled with pressure. Integumentary (Hair, Skin) No suspicious lesions. No crepitus or fluctuance. No peri-wound warmth or erythema. No masses.. Wound #1 status is Open. Original cause of wound was Pressure Injury. The wound is located on the Left Calcaneous. The wound measures 2cm length x 2.5cm width x 0.4cm depth; 3.927cm^2 area and 1.571cm^3 volume. The wound is limited to skin breakdown. There is no tunneling or undermining noted. There is a large amount of serous drainage noted. The wound margin is indistinct and nonvisible. There is large (67-100%) red, pink granulation within the wound bed. There is a small (  1-33%) amount of necrotic tissue within the wound bed including Adherent Slough. The periwound skin appearance exhibited: Maceration, Moist, Erythema. The periwound skin appearance did not exhibit: Callus, Crepitus, Excoriation, Fluctuance, Friable, Induration, Localized Edema, Rash, Scarring, Dry/Scaly, Atrophie Blanche, Cyanosis, Ecchymosis, Hemosiderin Staining, Mottled, Pallor, Rubor. The surrounding wound skin color is noted with erythema which is circumferential. Periwound temperature was noted as No Abnormality. The periwound has tenderness on palpation. Wound #3 status is Open. Original cause of wound was Gradually Appeared. The wound is located on the Left,Dorsal Foot. The wound measures 0.1cm length x 0.1cm width x 0.1cm depth;  0.008cm^2 area and 0.001cm^3 volume. The wound is limited to skin breakdown. There is no tunneling or undermining noted. There is a medium amount of serous drainage noted. The wound margin is flat and intact. There is no granulation within the wound bed. There is a large (67-100%) amount of necrotic tissue within the wound bed including Adherent Slough. The periwound skin appearance exhibited: Moist. Assessment Active Problems ICD-10 E11.621 - Type 2 diabetes mellitus with foot ulcer L89.620 - Pressure ulcer of left heel, unstageable G30.9 - Alzheimer's disease, unspecified L97.422 - Non-pressure chronic ulcer of left heel and midfoot with fat layer exposed Having recently had vascular intervention done and now that she's got a better flow of blood to her heel, I have recommended Santyl ointment to the left heel to be changed daily and on the left ankle on the dorsum to apply a bordered foam. Once the granulation tissue fills up better we will try to get her a skin substitute, possibly Apligraf or Grafix [name], [name] ([id number]) Procedures Wound #1 Wound #1 is a Pressure Ulcer located on the Left Calcaneous . There was a Skin/Subcutaneous Tissue Debridement ([id number]) debridement with total area of 5 sq cm performed by Evlyn Kanner, MD. with the following instrument(s): Curette to remove Viable and Non-Viable tissue/material including Exudate, Fibrin/Slough, Eschar, and Subcutaneous after achieving pain control using Other (lidocaine 4% cream). A time out was conducted prior to the start of the procedure. A Minimum amount of bleeding was controlled with Pressure. The procedure was tolerated well with a pain level of 0 throughout and a pain level of 0 following the procedure. Post Debridement Measurements: 2cm length x 2.5cm width x 0.5cm depth; 1.963cm^3 volume. Post debridement Stage noted as Unstageable/Unclassified. Post procedure Diagnosis Wound #1: Same as  Pre-Procedure Plan Wound Cleansing: Wound #1 Left Calcaneous: Clean wound with Normal Saline. May Shower, gently pat wound dry prior to applying new dressing. Wound #3 Left,Dorsal Foot: Clean wound with Normal Saline. Anesthetic: Wound #1 Left Calcaneous: Topical Lidocaine 4% cream applied to wound bed prior to debridement Wound #3 Left,Dorsal Foot: Topical Lidocaine 4% cream applied to wound bed prior to debridement Skin Barriers/Peri-Wound Care: Wound #1 Left Calcaneous: Skin Prep Wound #3 Left,Dorsal Foot: Skin Prep Primary Wound Dressing: Wound #1 Left Calcaneous: Santyl Ointment Wound #3 Left,Dorsal Foot: Prisma Ag - moisten with normal saline Secondary Dressing: Wound #1 Left Calcaneous: Dry Gauze Boardered Foam Dressing Wound #3 Left,Dorsal Foot: Dry Gauze Boardered Foam Dressing [name], [name] ([id number]) Dressing Change Frequency: Wound #1 Left Calcaneous: Change dressing every other day. - for home health purposes Wound #3 Left,Dorsal Foot: Change dressing every other day. - for home health purposes Follow-up Appointments: Wound #1 Left Calcaneous: Return Appointment in 1 week. Wound #3 Left,Dorsal Foot: Return Appointment in 1 week. Off-Loading: Wound #1 Left Calcaneous: Heel suspension boot to: - sage boots PEG ASSIST SHOE Turn and reposition every 2  hours Other: - FLOAT HEELS WHEN LYING IN BED Wound #3 Left,Dorsal Foot: Heel suspension boot to: - sage boots PEG ASSIST SHOE Turn and reposition every 2 hours Other: - FLOAT HEELS WHEN LYING IN BED Home Health: Wound #1 Left Calcaneous: Continue Home Health Visits - [facility] *****PLEASE CHANGE YOUR DAY TO MONDAYS******* PT IS BEING SEEN IN THE CLINIC ON THURSDAYS NOW******* Home Health Nurse may visit PRN to address patient s wound care needs. FACE TO FACE ENCOUNTER: MEDICARE and MEDICAID PATIENTS: I certify that this patient is under my care and that I had a face-to-face encounter that meets the  physician face-to-face encounter requirements with this patient on this date. The encounter with the patient was in whole or in part for the following MEDICAL CONDITION: (primary reason for Home Healthcare) MEDICAL NECESSITY: I certify, that based on my findings, NURSING services are a medically necessary home health service. HOME BOUND STATUS: I certify that my clinical findings support that this patient is homebound (i.e., Due to illness or injury, pt requires aid of supportive devices such as crutches, cane, wheelchairs, walkers, the use of special transportation or the assistance of another person to leave their place of residence. There is a normal inability to leave the home and doing so requires considerable and taxing effort. Other absences are for medical reasons / religious services and are infrequent or of short duration when for other reasons). If current dressing causes regression in wound condition, may D/C ordered dressing product/s and apply Normal Saline Moist Dressing daily until next Wound Healing Center / Other MD appointment. Notify Wound Healing Center of regression in wound condition at [phone]. Please direct any NON-WOUND related issues/requests for orders to patient's Primary Care Physician Wound #3 Left,Dorsal Foot: Continue Home Health Visits - [facility] Nurse may visit PRN to address patient s wound care needs. FACE TO FACE ENCOUNTER: MEDICARE and MEDICAID PATIENTS: I certify that this patient is under my care and that I had a face-to-face encounter that meets the physician face-to-face encounter requirements with this patient on this date. The encounter with the patient was in whole or in part for the following MEDICAL CONDITION: (primary reason for Home Healthcare) MEDICAL NECESSITY: I certify, that based on my findings, NURSING services are a medically necessary home health service. HOME BOUND STATUS: I certify that my clinical findings support  that this patient is homebound (i.e., Due to illness or injury, pt requires aid of supportive devices such as crutches, cane, wheelchairs, walkers, the use of special transportation or the assistance of another person to leave their place of residence. There is a normal inability to leave the home and doing so requires considerable and taxing effort. Other absences are for medical reasons / religious services and are infrequent or of short duration when for other reasons). [name], [name] ([id number]) If current dressing causes regression in wound condition, may D/C ordered dressing product/s and apply Normal Saline Moist Dressing daily until next Wound Healing Center / Other MD appointment. Notify Wound Healing Center of regression in wound condition at [phone]. Please direct any NON-WOUND related issues/requests for orders to patient's Primary Care Physician Having recently had vascular intervention done and now that she's got a better flow of blood to her heel, I have recommended Santyl ointment to the left heel to be changed daily and on the left ankle on the dorsum to apply a bordered foam. Once the granulation tissue fills up better we will try to get her a skin substitute, possibly  Apligraf or Grafix Electronic Signature(s) Signed: 07/11/2015 9:47:17 AM By: Evlyn Kanner MD, FACS Entered By: Evlyn Kanner on 07/11/2015 09:47:16 [name], [name] ([id number]) -------------------------------------------------------------------------------- SuperBill Details Patient Name: [name] Date of Service: 07/11/2015 Medical Record Number: [id number] Patient Account Number: [id number] Date of Birth/Sex: 17-May-1927 (82 y.o. Female) Treating RN: Ashok Cordia, Debi Primary Care Physician: Loney Laurence Other Clinician: Referring Physician: Loney Laurence Treating Physician/Extender: Rudene Re in Treatment: 19 Diagnosis Coding ICD-10 Codes Code Description E11.621 Type  2 diabetes mellitus with foot ulcer L89.620 Pressure ulcer of left heel, unstageable G30.9 Alzheimer's disease, unspecified L97.422 Non-pressure chronic ulcer of left heel and midfoot with fat layer exposed Facility Procedures CPT4 Code Description: 08657846 11042 - DEB SUBQ TISSUE 20 SQ CM/< ICD-10 Description Diagnosis E11.621 Type 2 diabetes mellitus with foot ulcer L89.620 Pressure ulcer of left heel, unstageable G30.9 Alzheimer's disease, unspecified L97.422 Non-pressure  chronic ulcer of left heel and midfoot Modifier: with fat laye Quantity: 1 r exposed Physician Procedures CPT4 Code Description: 9629528 99213 - WC PHYS LEVEL 3 - EST PT ICD-10 Description Diagnosis E11.621 Type 2 diabetes mellitus with foot ulcer L89.620 Pressure ulcer of left heel, unstageable G30.9 Alzheimer's disease, unspecified L97.422 Non-pressure  chronic ulcer of left heel and midfoot Modifier: 25 with fat laye Quantity: 1 r exposed CPT4 Code Description: 4132440 11042 - WC PHYS SUBQ TISS 20 SQ CM ICD-10 Description Diagnosis E11.621 Type 2 diabetes mellitus with foot ulcer L89.620 Pressure ulcer of left heel, unstageable G30.9 Alzheimer's disease, unspecified L97.422 Non-pressure  chronic ulcer of left heel and midfoot [name], [name] ([id number]) Modifier: with fat laye Quantity: 1 r exposed Electronic Signature(s) Signed: 07/11/2015 9:47:39 AM By: Evlyn Kanner MD, FACS Entered By: Evlyn Kanner on 07/11/2015 09:47:38

## 2015-07-16 NOTE — Progress Notes (Signed)
KAREM, FARHA ([id number]) Visit Report for 07/11/2015 Arrival Information Details Patient Name: [name], [name] Date of Service: 07/11/2015 8:45 AM Medical Record Number: [id number] Patient Account Number: [id number] Date of Birth/Sex: 03/11/1928 (82 y.o. Female) Treating RN: Ashok Cordia, Debi Primary Care Physician: Loney Laurence Other Clinician: Referring Physician: Loney Laurence Treating Physician/Extender: Rudene Re in Treatment: 19 Visit Information History Since Last Visit All ordered tests and consults were completed: No Patient Arrived: Wheel Chair Added or deleted any medications: No Arrival Time: 08:56 Any new allergies or adverse reactions: No Accompanied By: caregiver Had a fall or experienced change in No Transfer Assistance: None activities of daily living that may affect Patient Requires Transmission- No risk of falls: Based Precautions: Signs or symptoms of abuse/neglect since last No Patient Has Alerts: Yes visito Patient Alerts: Patient on Blood Hospitalized since last visit: No Thinner Pain Present Now: No ABI: Non Compressible Electronic Signature(s) Signed: 07/15/2015 5:37:49 PM By: Alejandro Mulling Entered By: Alejandro Mulling on 07/11/2015 08:59:39 Blasco, Mavis ([id number]) -------------------------------------------------------------------------------- Encounter Discharge Information Details Patient Name: [name] Date of Service: 07/11/2015 8:45 AM Medical Record Number: [id number] Patient Account Number: [id number] Date of Birth/Sex: April 03, 1928 (82 y.o. Female) Treating RN: Ashok Cordia, Debi Primary Care Physician: Loney Laurence Other Clinician: Referring Physician: Loney Laurence Treating Physician/Extender: Rudene Re in Treatment: 64 Encounter Discharge Information Items Discharge Pain Level: 0 Discharge Condition: Stable Ambulatory Status: Wheelchair Discharge Destination: Nursing  Home Transportation: Private Auto Accompanied By: caregiver Schedule Follow-up Appointment: Yes Medication Reconciliation completed Yes and provided to Patient/Care Mirela Parsley: Provided on Clinical Summary of Care: 07/11/2015 Form Type Recipient Paper Patient BM Electronic Signature(s) Signed: 07/11/2015 9:52:54 AM By: Gwenlyn Perking Entered By: Gwenlyn Perking on 07/11/2015 09:52:54 Cuellar, Suzy ([id number]) -------------------------------------------------------------------------------- Lower Extremity Assessment Details Patient Name: [name] Date of Service: 07/11/2015 8:45 AM Medical Record Number: [id number] Patient Account Number: [id number] Date of Birth/Sex: [birth year] (82 y.o. Female) Treating RN: Ashok Cordia, Debi Primary Care Physician: Loney Laurence Other Clinician: Referring Physician: Loney Laurence Treating Physician/Extender: Rudene Re in Treatment: 19 Vascular Assessment Pulses: Posterior Tibial Dorsalis Pedis Palpable: [Left:Yes] Extremity colors, hair growth, and conditions: Extremity Color: [Left:Normal] Temperature of Extremity: [Left:Warm] Capillary Refill: [Left:< 3 seconds] Toe Nail Assessment Left: Right: Thick: Yes Discolored: No Deformed: No Improper Length and Hygiene: No Electronic Signature(s) Signed: 07/15/2015 5:37:49 PM By: Alejandro Mulling Entered By: Alejandro Mulling on 07/11/2015 09:07:14 Crader, Amarya ([id number]) -------------------------------------------------------------------------------- Multi Wound Chart Details Patient Name: [name] Date of Service: 07/11/2015 8:45 AM Medical Record Number: [id number] Patient Account Number: [id number] Date of Birth/Sex: 1928/04/30 (82 y.o. Female) Treating RN: Ashok Cordia, Debi Primary Care Physician: Loney Laurence Other Clinician: Referring Physician: Loney Laurence Treating Physician/Extender: Rudene Re in Treatment: 19 Vital  Signs Height(in): 64 Pulse(bpm): 81 Weight(lbs): Blood Pressure 144/62 (mmHg): Body Mass Index(BMI): Temperature(F): 98.1 Respiratory Rate 18 (breaths/min): Photos: [1:No Photos] [3:No Photos] [N/A:N/A] Wound Location: [1:Left Calcaneous] [3:Left Foot - Dorsal] [N/A:N/A] Wounding Event: [1:Pressure Injury] [3:Gradually Appeared] [N/A:N/A] Primary Etiology: [1:Pressure Ulcer] [3:Trauma, Other] [N/A:N/A] Comorbid History: [1:Anemia, Hypertension, Type II Diabetes, History of pressure wounds, Osteoarthritis, Dementia] [3:Anemia, Hypertension, Type II Diabetes, History of pressure wounds, Osteoarthritis, Dementia] [N/A:N/A] Date Acquired: [1:01/30/2015] [3:06/19/2015] [N/A:N/A] Weeks of Treatment: [1:19] [3:3] [N/A:N/A] Wound Status: [1:Open] [3:Open] [N/A:N/A] Measurements L x W x D 2x2.5x0.4 [3:0.1x0.1x0.1] [N/A:N/A] (cm) Area (cm) : [1:3.927] [3:0.008] [N/A:N/A] Volume (cm) : [1:1.571] [3:0.001] [N/A:N/A] % Reduction in Area: [1:59.20%] [3:93.70%] [N/A:N/A] % Reduction in Volume: -63.30% [3:92.30%] [N/A:N/A] Classification: [1:Unstageable/Unclassified] [3:Partial Thickness] [N/A:N/A] HBO Classification: [1:Grade 1] [3:Grade  1] [N/A:N/A] Exudate Amount: [1:Large] [3:Medium] [N/A:N/A] Exudate Type: [1:Serous] [3:Serous] [N/A:N/A] Exudate Color: [1:amber] [3:amber] [N/A:N/A] Wound Margin: [1:Indistinct, nonvisible] [3:Flat and Intact] [N/A:N/A] Granulation Amount: [1:Large (67-100%)] [3:None Present (0%)] [N/A:N/A] Granulation Quality: [1:Red, Pink] [3:N/A] [N/A:N/A] Necrotic Amount: [1:Small (1-33%)] [3:Large (67-100%)] [N/A:N/A] Exposed Structures: [1:Fascia: No Fat: No Tendon: No Muscle: No] [3:Fascia: No Fat: No Tendon: No Muscle: No] [N/A:N/A] Joint: No Joint: No Bone: No Bone: No Limited to Skin Limited to Skin Breakdown Breakdown Epithelialization: None None N/A Periwound Skin Texture: Edema: No No Abnormalities Noted N/A Excoriation: No Induration: No Callus:  No Crepitus: No Fluctuance: No Friable: No Rash: No Scarring: No Periwound Skin Maceration: Yes Moist: Yes N/A Moisture: Moist: Yes Dry/Scaly: No Periwound Skin Color: Erythema: Yes No Abnormalities Noted N/A Atrophie Blanche: No Cyanosis: No Ecchymosis: No Hemosiderin Staining: No Mottled: No Pallor: No Rubor: No Erythema Location: Circumferential N/A N/A Temperature: No Abnormality N/A N/A Tenderness on Yes No N/A Palpation: Wound Preparation: Ulcer Cleansing: Ulcer Cleansing: N/A Rinsed/Irrigated with Rinsed/Irrigated with Saline Saline Topical Anesthetic Topical Anesthetic Applied: Other: lidocaine Applied: Other: lidocaine 4% Treatment Notes Electronic Signature(s) Signed: 07/15/2015 5:37:49 PM By: Alejandro MullingPinkerton, Debra Entered By: Alejandro MullingPinkerton, Debra on 07/11/2015 09:14:52 Morfin, [name] ([id number]) -------------------------------------------------------------------------------- Multi-Disciplinary Care Plan Details Patient Name: Gloria FateMORDECAI, Wandalee Date of Service: 07/11/2015 8:45 AM Medical Record Number: [id number] Patient Account Number: [id number] Date of Birth/Sex: 02-10-1928 (82 y.o. Female) Treating RN: Ashok CordiaPinkerton, Debi Primary Care Physician: Loney LaurenceAMPBELL, MARGARET Other Clinician: Referring Physician: Loney LaurenceAMPBELL, MARGARET Treating Physician/Extender: Rudene ReBritto, Errol Weeks in Treatment: 5219 Active Inactive Orientation to the Wound Care Program Nursing Diagnoses: Knowledge deficit related to the wound healing center program Goals: Patient/caregiver will verbalize understanding of the Wound Healing Center Program Date Initiated: 02/27/2015 Goal Status: Active Interventions: Provide education on orientation to the wound center Notes: Pressure Nursing Diagnoses: Knowledge deficit related to management of pressures ulcers Potential for impaired tissue integrity related to pressure, friction, moisture, and shear Goals: Patient will remain free from development of  additional pressure ulcers Date Initiated: 02/27/2015 Goal Status: Active Patient will remain free of pressure ulcers Date Initiated: 02/27/2015 Goal Status: Active Patient/caregiver will verbalize risk factors for pressure ulcer development Date Initiated: 02/27/2015 Goal Status: Active Patient/caregiver will verbalize understanding of pressure ulcer management Date Initiated: 02/27/2015 Goal Status: Active Interventions: Assess: immobility, friction, shearing, incontinence upon admission and as needed Pelc, Analysia ([id number]) Assess offloading mechanisms upon admission and as needed Assess potential for pressure ulcer upon admission and as needed Provide education on pressure ulcers Treatment Activities: Patient referred for home evaluation of offloading devices/mattresses : 07/11/2015 Patient referred for pressure reduction/relief devices : 07/11/2015 Notes: Wound/Skin Impairment Nursing Diagnoses: Impaired tissue integrity Knowledge deficit related to ulceration/compromised skin integrity Goals: Patient/caregiver will verbalize understanding of skin care regimen Date Initiated: 02/27/2015 Goal Status: Active Ulcer/skin breakdown will have a volume reduction of 30% by week 4 Date Initiated: 02/27/2015 Goal Status: Active Ulcer/skin breakdown will have a volume reduction of 50% by week 8 Date Initiated: 02/27/2015 Goal Status: Active Ulcer/skin breakdown will have a volume reduction of 80% by week 12 Date Initiated: 02/27/2015 Goal Status: Active Ulcer/skin breakdown will heal within 14 weeks Date Initiated: 02/27/2015 Goal Status: Active Interventions: Assess patient/caregiver ability to obtain necessary supplies Assess patient/caregiver ability to perform ulcer/skin care regimen upon admission and as needed Assess ulceration(s) every visit Provide education on ulcer and skin care Treatment Activities: Skin care regimen initiated : 07/11/2015 Notes: Electronic  Signature(s) Gloria FateMORDECAI, Cynitha ([id number]) Signed: 07/15/2015 5:37:49 PM By: Alejandro MullingPinkerton, Debra Entered  By: Alejandro Mulling on 07/11/2015 09:14:45 Micallef, [name] ([id number]) -------------------------------------------------------------------------------- Pain Assessment Details Patient Name: [name], [name] Date of Service: 07/11/2015 8:45 AM Medical Record Number: [id number] Patient Account Number: [id number] Date of Birth/Sex: 1928/02/08 (82 y.o. Female) Treating RN: Ashok Cordia, Debi Primary Care Physician: Loney Laurence Other Clinician: Referring Physician: Loney Laurence Treating Physician/Extender: Rudene Re in Treatment: 19 Active Problems Location of Pain Severity and Description of Pain Patient Has Paino No Site Locations Pain Management and Medication Current Pain Management: Electronic Signature(s) Signed: 07/15/2015 5:37:49 PM By: Alejandro Mulling Entered By: Alejandro Mulling on 07/11/2015 09:00:51 [name] ([id number]) -------------------------------------------------------------------------------- Patient/Caregiver Education Details Patient Name: [name] Date of Service: 07/11/2015 8:45 AM Medical Record Number: [id number] Patient Account Number: [id number] Date of Birth/Gender: 19-Feb-1928 (82 y.o. Female) Treating RN: Ashok Cordia, Debi Primary Care Physician: Loney Laurence Other Clinician: Referring Physician: Loney Laurence Treating Physician/Extender: Rudene Re in Treatment: 110 Education Assessment Education Provided To: Patient and Caregiver Education Topics Provided Wound/Skin Impairment: Handouts: Other: change dressing as ordered Methods: Demonstration, Explain/Verbal Responses: State content correctly Electronic Signature(s) Signed: 07/15/2015 5:37:49 PM By: Alejandro Mulling Entered By: Alejandro Mulling on 07/11/2015 09:52:47 Boule, Reise  ([id number]) -------------------------------------------------------------------------------- Wound Assessment Details Patient Name: [name] Date of Service: 07/11/2015 8:45 AM Medical Record Number: [id number] Patient Account Number: [id number] Date of Birth/Sex: 1928/04/12 (82 y.o. Female) Treating RN: Ashok Cordia, Debi Primary Care Physician: Loney Laurence Other Clinician: Referring Physician: Loney Laurence Treating Physician/Extender: Rudene Re in Treatment: 19 Wound Status Wound Number: 1 Primary Pressure Ulcer Etiology: Wound Location: Left Calcaneous Wound Open Wounding Event: Pressure Injury Status: Date Acquired: 01/30/2015 Comorbid Anemia, Hypertension, Type II Weeks Of Treatment: 19 History: Diabetes, History of pressure wounds, Clustered Wound: No Osteoarthritis, Dementia Photos Photo Uploaded By: Alejandro Mulling on 07/11/2015 16:36:18 Wound Measurements Length: (cm) 2 Width: (cm) 2.5 Depth: (cm) 0.4 Area: (cm) 3.927 Volume: (cm) 1.571 % Reduction in Area: 59.2% % Reduction in Volume: -63.3% Epithelialization: None Tunneling: No Undermining: No Wound Description Classification: Unstageable/Unclassified Foul Od Diabetic Severity (Wagner): Grade 1 Wound Margin: Indistinct, nonvisible Exudate Amount: Large Exudate Type: Serous Exudate Color: amber or After Cleansing: No Wound Bed Granulation Amount: Large (67-100%) Exposed Structure Granulation Quality: Red, Pink Fascia Exposed: No Necrotic Amount: Small (1-33%) Fat Layer Exposed: No Fahey, Mignon ([id number]) Necrotic Quality: Adherent Slough Tendon Exposed: No Muscle Exposed: No Joint Exposed: No Bone Exposed: No Limited to Skin Breakdown Periwound Skin Texture Texture Color No Abnormalities Noted: No No Abnormalities Noted: No Callus: No Atrophie Blanche: No Crepitus: No Cyanosis: No Excoriation: No Ecchymosis: No Fluctuance: No Erythema: Yes Friable:  No Erythema Location: Circumferential Induration: No Hemosiderin Staining: No Localized Edema: No Mottled: No Rash: No Pallor: No Scarring: No Rubor: No Moisture Temperature / Pain No Abnormalities Noted: No Temperature: No Abnormality Dry / Scaly: No Tenderness on Palpation: Yes Maceration: Yes Moist: Yes Wound Preparation Ulcer Cleansing: Rinsed/Irrigated with Saline Topical Anesthetic Applied: Other: lidocaine, Treatment Notes Wound #1 (Left Calcaneous) 1. Cleansed with: Clean wound with Normal Saline 2. Anesthetic Topical Lidocaine 4% cream to wound bed prior to debridement 3. Peri-wound Care: Skin Prep 4. Dressing Applied: Santyl Ointment 5. Secondary Dressing Applied Bordered Foam Dressing Dry Gauze Electronic Signature(s) Signed: 07/15/2015 5:37:49 PM By: Alejandro Mulling Entered By: Alejandro Mulling on 07/11/2015 09:13:18 Skarda, Ania ([id number]) Susi, Jyll ([id number]) -------------------------------------------------------------------------------- Wound Assessment Details Patient Name: [name] Date of Service: 07/11/2015 8:45 AM Medical Record Number: [id number] Patient Account Number: [id number] Date of Birth/Sex: 01-08-1928 (82 y.o. Female) Treating RN: Ashok Cordia,  Debi Primary Care Physician: Loney Laurence Other Clinician: Referring Physician: Loney Laurence Treating Physician/Extender: Rudene Re in Treatment: 19 Wound Status Wound Number: 3 Primary Trauma, Other Etiology: Wound Location: Left Foot - Dorsal Wound Open Wounding Event: Gradually Appeared Status: Date Acquired: 06/19/2015 Comorbid Anemia, Hypertension, Type II Weeks Of Treatment: 3 History: Diabetes, History of pressure wounds, Clustered Wound: No Osteoarthritis, Dementia Photos Photo Uploaded By: Alejandro Mulling on 07/11/2015 16:36:19 Wound Measurements Length: (cm) 0.1 Width: (cm) 0.1 Depth: (cm) 0.1 Area: (cm) 0.008 Volume: (cm)  0.001 % Reduction in Area: 93.7% % Reduction in Volume: 92.3% Epithelialization: None Tunneling: No Undermining: No Wound Description Classification: Partial Thickness Foul Odor A Diabetic Severity (Wagner): Grade 1 Wound Margin: Flat and Intact Exudate Amount: Medium Exudate Type: Serous Exudate Color: amber fter Cleansing: No Wound Bed Granulation Amount: None Present (0%) Exposed Structure Necrotic Amount: Large (67-100%) Fascia Exposed: No Necrotic Quality: Adherent Slough Fat Layer Exposed: No Getchell, Keyla ([id number]) Tendon Exposed: No Muscle Exposed: No Joint Exposed: No Bone Exposed: No Limited to Skin Breakdown Periwound Skin Texture Texture Color No Abnormalities Noted: No No Abnormalities Noted: No Moisture No Abnormalities Noted: No Moist: Yes Wound Preparation Ulcer Cleansing: Rinsed/Irrigated with Saline Topical Anesthetic Applied: Other: lidocaine 4%, Treatment Notes Wound #3 (Left, Dorsal Foot) 1. Cleansed with: Clean wound with Normal Saline 2. Anesthetic Topical Lidocaine 4% cream to wound bed prior to debridement 3. Peri-wound Care: Skin Prep 4. Dressing Applied: Prisma Ag 5. Secondary Dressing Applied Bordered Foam Dressing Dry Gauze Electronic Signature(s) Signed: 07/15/2015 5:37:49 PM By: Alejandro Mulling Entered By: Alejandro Mulling on 07/11/2015 09:14:16 Liebig, Lillianah ([id number]) -------------------------------------------------------------------------------- Vitals Details Patient Name: [name] Date of Service: 07/11/2015 8:45 AM Medical Record Number: [id number] Patient Account Number: [id number] Date of Birth/Sex: 10/28/1927 (82 y.o. Female) Treating RN: Ashok Cordia, Debi Primary Care Physician: Loney Laurence Other Clinician: Referring Physician: Loney Laurence Treating Physician/Extender: Rudene Re in Treatment: 19 Vital Signs Time Taken: 09:05 Temperature (F): 98.1 Height (in): 64 Pulse  (bpm): 81 Respiratory Rate (breaths/min): 18 Blood Pressure (mmHg): 144/62 Reference Range: 80 - 120 mg / dl Electronic Signature(s) Signed: 07/15/2015 5:37:49 PM By: Alejandro Mulling Entered By: Alejandro Mulling on 07/11/2015 09:05:55

## 2015-07-18 ENCOUNTER — Encounter: Payer: Medicare Other | Admitting: Surgery

## 2015-07-18 DIAGNOSIS — E11621 Type 2 diabetes mellitus with foot ulcer: Secondary | ICD-10-CM | POA: Diagnosis not present

## 2015-07-19 NOTE — Progress Notes (Signed)
[name] ([id number]) Visit Report for 07/18/2015 Chief Complaint Document Details Patient Name: [name], [name] 07/18/2015 10:45 Date of Service: AM Medical Record [id number] Number: Patient Account Number: [id number] 1928/01/24 (82 y.o. Treating RN: [name] Haggis Date of Birth/Sex: Female) Other Clinician: Primary Care Physician: CAMPBELL, MARGARET Treating Feras Gardella Referring Physician: Loney Laurence Physician/Extender: Weeks in Treatment: 20 Information Obtained from: Patient Chief Complaint patient is back to see [place] today for diabetic foot ulcer which she's had for several months. Electronic Signature(s) Signed: 07/18/2015 11:37:58 AM By: Evlyn Kanner MD, FACS Entered By: Evlyn Kanner on 07/18/2015 11:37:58 Humes, [name] ([id number]) -------------------------------------------------------------------------------- Debridement Details Patient Name: Gloria, [name] 07/18/2015 10:45 Date of Service: AM Medical Record [id number] Number: Patient Account Number: [id number] Jun 09, 1927 (82 y.o. Treating RN: [name] Haggis Date of Birth/Sex: Female) Other Clinician: Primary Care Physician: CAMPBELL, MARGARET Treating Shantrell Placzek Referring Physician: Loney Laurence Physician/Extender: Weeks in Treatment: 20 Debridement Performed for Wound #1 Left Calcaneous Assessment: Performed By: Physician Evlyn Kanner, MD Debridement: Debridement Pre-procedure Yes Verification/Time Out Taken: Start Time: 11:32 Pain Control: Other : lidocaine 4% cream Level: Skin/Subcutaneous Tissue Total Area Debrided (L x 2 (cm) x 2.3 (cm) = 4.6 (cm) W): Tissue and other Viable, Non-Viable, Exudate, Fibrin/Slough, Subcutaneous material debrided: Instrument: Curette Bleeding: Minimum Hemostasis Achieved: Pressure End Time: 11:35 Procedural Pain: 0 Post Procedural Pain: 0 Response to Treatment: Procedure was tolerated well Post Debridement Measurements of Total  Wound Length: (cm) 2 Stage: Unstageable/Unclassified Width: (cm) 2.3 Depth: (cm) 0.4 Volume: (cm) 1.445 Post Procedure Diagnosis Same as Pre-procedure Electronic Signature(s) Signed: 07/18/2015 11:37:43 AM By: Evlyn Kanner MD, FACS Signed: 07/18/2015 7:01:21 PM By: Alejandro Mulling Entered By: Evlyn Kanner on 07/18/2015 11:37:43 Pigford, Correen ([id number]) -------------------------------------------------------------------------------- HPI Details Patient Name: Gloria, [name] 07/18/2015 10:45 Date of Service: AM Medical Record [id number] Number: Patient Account Number: [id number] 1927-08-01 (82 y.o. Treating RN: [name] Haggis Date of Birth/Sex: Female) Other Clinician: Primary Care Physician: CAMPBELL, MARGARET Treating Shevelle Smither Referring Physician: Loney Laurence Physician/Extender: Weeks in Treatment: 20 History of Present Illness Location: left heel and dorsum of the foot Quality: Patient reports No Pain. Severity: Patient states wound (s) are getting better. Duration: the patient has had surgery in early December and had vascular testing ordered but not done. Timing: Pain in wound is Intermittent (comes and goes Context: The wound appeared gradually over time Modifying Factors: Other treatment(s) tried include: local dressings and offloading. surgery done by Dr. Graciela Husbands for debridement of the left heel wound Associated Signs and Symptoms: Patient reports having difficulty standing for long periods. HPI Description: 82 year old patient with past medical history of Alzheimer's dementia, diabetes mellitus, hypertension and recent history of right hip arthroplasty in August 2016. Her past medical history is significant also for hypothyroidism, hypertension, urinary retention, dementia. 04/11/2015 -- she has had a lot of pain and left heel and there has been a lot of drainage today with purulent material. He has not been running fever. 04/18/2015 -- last week  she was here with a florid infection of her left heel and I had sent her to the hospital for admission. She was admitted between 12/ 2 and 12/ 5 and she underwent a debridement of the left heel abscess with placement of antibiotic beads with vancomycin, by Dr. Linus Galas. Her cultures grew gram- negative bacteria, was given vancomycin and Unasyn IV in hospital and she was continued on oral antibiotics and was given documented for 10 days. at the time of discharge Dr. Alberteen Spindle wanted the dressing to be intact for about  a week before it would need changing. 05/29/2014 -- she has not been to see [place] for the last 6 weeks and from what I understand she has been in rehabilitation facility. No vascular testing was done as we had requested. She has a diabetic foot ulcer which was graded as a Wagner 3 when she was seen last about a month and a half ago. There is no family member to discuss hyperbaric oxygen therapy and the patient has as Alzheimer's dementia and I do not believe she understands our discussions 06/12/2015 -- a vascular appointment is pending for tomorrow. 06/19/2015 -- the vascular workup revealed that the left lower extremity had a 50-99% stenosis in the left superficial femoral, popliteal and tibioperoneal trunk. 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 Dr. Wyn Quaker has recommended angiographically and possible stent placement for this critical ischemia. 07/03/2015 -- he is going to have a stent placement done by Dr. Wyn Quaker this coming Monday. 07/11/2015 -- she was taken for an aortogram and found to have diffuse disease SFA with a high-grade near occlusive stenosis in the distal SFA just above Hunter's canal. One-vessel runoff being the wound area and to get tibial artery at the proximal anterior tibial artery had a short segment occlusion in the distal anterior tibial artery and a moderate to high-grade disease of about 10-12 cm. [name] ([id number]) she had a  percutaneous angioplasty of the distal and middle left anterior tibial artery, proximal left anterior tibial artery and distal SFA and proximal popliteal artery. Electronic Signature(s) Signed: 07/18/2015 11:38:15 AM By: Evlyn Kanner MD, FACS Entered By: Evlyn Kanner on 07/18/2015 11:38:15 VERNEDA, HOLLOPETER ([id number]) -------------------------------------------------------------------------------- Physical Exam Details Patient Name: CYRAH, MCLAMB 07/18/2015 10:45 Date of Service: AM Medical Record [id number] Number: Patient Account Number: [id number] 06-12-1927 (82 y.o. Treating RN: [name] Haggis Date of Birth/Sex: Female) Other Clinician: Primary Care Physician: CAMPBELL, MARGARET Treating Kaylana Fenstermacher Referring Physician: Loney Laurence Physician/Extender: Weeks in Treatment: 20 Constitutional . Pulse regular. Respirations normal and unlabored. Afebrile. . Eyes Nonicteric. Reactive to light. Ears, Nose, Mouth, and Throat Lips, teeth, and gums WNL.Marland Kitchen Moist mucosa without lesions. Neck supple and nontender. No palpable supraclavicular or cervical adenopathy. Normal sized without goiter. Respiratory WNL. No retractions.. Cardiovascular Pedal Pulses WNL. No clubbing, cyanosis or edema. Lymphatic No adneopathy. No adenopathy. No adenopathy. Musculoskeletal Adexa without tenderness or enlargement.. Digits and nails w/o clubbing, cyanosis, infection, petechiae, ischemia, or inflammatory conditions.. Integumentary (Hair, Skin) No suspicious lesions. No crepitus or fluctuance. No peri-wound warmth or erythema. No masses.Marland Kitchen Psychiatric Judgement and insight Intact.. No evidence of depression, anxiety, or agitation.. Notes the dorsum of the foot in the region of the ulceration has quite a bit of debris down to the subcutaneous tissue and using a #3 curet I sharply debrided this down to the healthy granulation tissue and bleeding was controlled with pressure Electronic  Signature(s) Signed: 07/18/2015 11:38:55 AM By: Evlyn Kanner MD, FACS Entered By: Evlyn Kanner on 07/18/2015 11:38:55 Jaber, [name] ([id number]) -------------------------------------------------------------------------------- Physician Orders Details Patient Name: EILEEN, KANGAS 07/18/2015 10:45 Date of Service: AM Medical Record [id number] Number: Patient Account Number: [id number] 1927/07/26 (82 y.o. Treating RN: [name] Haggis Date of Birth/Sex: Female) Other Clinician: Primary Care Physician: CAMPBELL, MARGARET Treating Saliha Salts Referring Physician: Loney Laurence Physician/Extender: Tania Ade in Treatment: 20 Verbal / Phone Orders: Yes ClinicianAshok Cordia, Debi Read Back and Verified: Yes Diagnosis Coding ICD-10 Coding Code Description E11.621 Type 2 diabetes mellitus with foot ulcer L89.620 Pressure ulcer of left heel, unstageable G30.9 Alzheimer's  disease, unspecified L97.422 Non-pressure chronic ulcer of left heel and midfoot with fat layer exposed Wound Cleansing Wound #1 Left Calcaneous o Clean wound with Normal Saline. o May Shower, gently pat wound dry prior to applying new dressing. Wound #3 Left,Dorsal Foot o Clean wound with Normal Saline. Anesthetic Wound #1 Left Calcaneous o Topical Lidocaine 4% cream applied to wound bed prior to debridement Wound #3 Left,Dorsal Foot o Topical Lidocaine 4% cream applied to wound bed prior to debridement Skin Barriers/Peri-Wound Care Wound #1 Left Calcaneous o Skin Prep Wound #3 Left,Dorsal Foot o Skin Prep Primary Wound Dressing Wound #1 Left Calcaneous o Santyl Ointment Wound #3 Left,Dorsal Foot Chimento, Aziah ([id number]) o Prisma Ag - moisten with normal saline Secondary Dressing Wound #1 Left Calcaneous o Dry Gauze o Boardered Foam Dressing Wound #3 Left,Dorsal Foot o Dry Gauze o Conform/Kerlix - tape o Boardered Foam Dressing Dressing Change Frequency Wound #1  Left Calcaneous o Change dressing every other day. - for home health purposes Wound #3 Left,Dorsal Foot o Change dressing every other day. - for home health purposes Follow-up Appointments Wound #1 Left Calcaneous o Return Appointment in 1 week. Wound #3 Left,Dorsal Foot o Return Appointment in 1 week. Off-Loading Wound #1 Left Calcaneous o Heel suspension boot to: - sage boots PEG ASSIST SHOE o Turn and reposition every 2 hours o Other: - FLOAT HEELS WHEN LYING IN BED Wound #3 Left,Dorsal Foot o Heel suspension boot to: - sage boots PEG ASSIST SHOE o Turn and reposition every 2 hours o Other: - FLOAT HEELS WHEN LYING IN BED Home Health Wound #1 Left Calcaneous o Continue Home Health Visits - [facility] *****PLEASE CHANGE YOUR DAY TO MONDAYS******* PT IS BEING SEEN IN THE CLINIC ON THURSDAYS NOW******* o Home Health Nurse may visit PRN to address patientos wound care needs. o FACE TO FACE ENCOUNTER: MEDICARE and MEDICAID PATIENTS: I certify that this patient is under my care and that I had a face-to-face encounter that meets the physician face-to-face KIMI, BORDEAU ([id number]) encounter requirements with this patient on this date. The encounter with the patient was in whole or in part for the following MEDICAL CONDITION: (primary reason for Home Healthcare) MEDICAL NECESSITY: I certify, that based on my findings, NURSING services are a medically necessary home health service. HOME BOUND STATUS: I certify that my clinical findings support that this patient is homebound (i.e., [name] to illness or injury, pt requires aid of supportive devices such as crutches, cane, wheelchairs, walkers, the use of special transportation or the assistance of another person to leave their place of residence. There is a normal inability to leave the home and doing so requires considerable and taxing effort. Other absences are for medical reasons / religious services and are  infrequent or of short duration when for other reasons). o If current dressing causes regression in wound condition, may D/C ordered dressing product/s and apply Normal Saline Moist Dressing daily until next Wound Healing Center / Other MD appointment. Notify Wound Healing Center of regression in wound condition at [phone]. o Please direct any NON-WOUND related issues/requests for orders to patient's Primary Care Physician Wound #3 Left,Dorsal Foot o Continue Home Health Visits - [facility] o Home Health Nurse may visit PRN to address patientos wound care needs. o FACE TO FACE ENCOUNTER: MEDICARE and MEDICAID PATIENTS: I certify that this patient is under my care and that I had a face-to-face encounter that meets the physician face-to-face encounter requirements with this patient on this date. The encounter  with the patient was in whole or in part for the following MEDICAL CONDITION: (primary reason for Home Healthcare) MEDICAL NECESSITY: I certify, that based on my findings, NURSING services are a medically necessary home health service. HOME BOUND STATUS: I certify that my clinical findings support that this patient is homebound (i.e., [name] to illness or injury, pt requires aid of supportive devices such as crutches, cane, wheelchairs, walkers, the use of special transportation or the assistance of another person to leave their place of residence. There is a normal inability to leave the home and doing so requires considerable and taxing effort. Other absences are for medical reasons / religious services and are infrequent or of short duration when for other reasons). o If current dressing causes regression in wound condition, may D/C ordered dressing product/s and apply Normal Saline Moist Dressing daily until next Wound Healing Center / Other MD appointment. Notify Wound Healing Center of regression in wound condition at [phone]. o Please direct any NON-WOUND  related issues/requests for orders to patient's Primary Care Physician Electronic Signature(s) Signed: 07/18/2015 3:47:35 PM By: Evlyn KannerBritto, Shavy Beachem MD, FACS Signed: 07/18/2015 7:01:21 PM By: Alejandro MullingPinkerton, Debra Entered By: Alejandro MullingPinkerton, Debra on 07/18/2015 11:39:41 Reinwald, Jeany ([id number]) -------------------------------------------------------------------------------- Problem List Details Patient Name: Rosie FateMORDECAI, Devony 07/18/2015 10:45 Date of Service: AM Medical Record [id number] Number: Patient Account Number: [id number] 08-May-1928 (82 y.o. Treating RN: [name] HaggisPinkerton, Debi Date of Birth/Sex: Female) Other Clinician: Primary Care Physician: CAMPBELL, MARGARET Treating Konstantinos Cordoba Referring Physician: Loney LaurenceAMPBELL, MARGARET Physician/Extender: Weeks in Treatment: 20 Active Problems ICD-10 Encounter Code Description Active Date Diagnosis E11.621 Type 2 diabetes mellitus with foot ulcer 02/27/2015 Yes L89.620 Pressure ulcer of left heel, unstageable 02/27/2015 Yes G30.9 Alzheimer's disease, unspecified 02/27/2015 Yes L97.422 Non-pressure chronic ulcer of left heel and midfoot with fat 05/30/2015 Yes layer exposed Inactive Problems Resolved Problems ICD-10 Code Description Active Date Resolved Date L89.521 Pressure ulcer of left ankle, stage 1 02/27/2015 02/27/2015 L03.116 Cellulitis of left lower limb 04/11/2015 04/11/2015 Electronic Signature(s) Signed: 07/18/2015 11:37:00 AM By: Evlyn KannerBritto, Makaley Storts MD, FACS Entered By: Evlyn KannerBritto, Tamarra Geiselman on 07/18/2015 11:36:59 Dada, [name] ([id number]) -------------------------------------------------------------------------------- Progress Note Details Patient Name: Rosie FateMORDECAI, Shreena 07/18/2015 10:45 Date of Service: AM Medical Record [id number] Number: Patient Account Number: [id number] 08-May-1928 (82 y.o. Treating RN: [name] HaggisPinkerton, Debi Date of Birth/Sex: Female) Other Clinician: Primary Care Physician: CAMPBELL, MARGARET Treating Omere Marti Referring  Physician: Loney LaurenceAMPBELL, MARGARET Physician/Extender: Tania AdeWeeks in Treatment: 20 Subjective Chief Complaint Information obtained from Patient patient is back to see us today for diabetic foot ulcer which she's had for several months. History of Present Illness (HPI) The following HPI elements were documented for the patient's wound: Location: left heel and dorsum of the foot Quality: Patient reports No Pain. Severity: Patient states wound (s) are getting better. Duration: the patient has had surgery in early December and had vascular testing ordered but not done. Timing: Pain in wound is Intermittent (comes and goes Context: The wound appeared gradually over time Modifying Factors: Other treatment(s) tried include: local dressings and offloading. surgery done by Dr. Graciela HusbandsKlein for debridement of the left heel wound Associated Signs and Symptoms: Patient reports having difficulty standing for long periods. 82 year old patient with past medical history of Alzheimer's dementia, diabetes mellitus, hypertension and recent history of right hip arthroplasty in August 2016. Her past medical history is significant also for hypothyroidism, hypertension, urinary retention, dementia. 04/11/2015 -- she has had a lot of pain and left heel and there has been a lot of drainage today with purulent material. He has  not been running fever. 04/18/2015 -- last week she was here with a florid infection of her left heel and I had sent her to the hospital for admission. She was admitted between 12/ 2 and 12/ 5 and she underwent a debridement of the left heel abscess with placement of antibiotic beads with vancomycin, by Dr. Linus Galas. Her cultures grew gram- negative bacteria, was given vancomycin and Unasyn IV in hospital and she was continued on oral antibiotics and was given documented for 10 days. at the time of discharge Dr. Alberteen Spindle wanted the dressing to be intact for about a week before it would need  changing. 05/29/2014 -- she has not been to see [place] for the last 6 weeks and from what I understand she has been in rehabilitation facility. No vascular testing was done as we had requested. She has a diabetic foot ulcer which was graded as a Wagner 3 when she was seen last about a month and a half ago. There is no family member to discuss hyperbaric oxygen therapy and the patient has as Alzheimer's dementia and I do not believe she understands our discussions 06/12/2015 -- a vascular appointment is pending for tomorrow. 06/19/2015 -- the vascular workup revealed that the left lower extremity had a 50-99% stenosis in the left Ruzich, Corry ([id number]) superficial femoral, popliteal and tibioperoneal trunk. 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 Dr. Wyn Quaker has recommended angiographically and possible stent placement for this critical ischemia. 07/03/2015 -- he is going to have a stent placement done by Dr. Wyn Quaker this coming Monday. 07/11/2015 -- she was taken for an aortogram and found to have diffuse disease SFA with a high-grade near occlusive stenosis in the distal SFA just above Hunter's canal. One-vessel runoff being the wound area and to get tibial artery at the proximal anterior tibial artery had a short segment occlusion in the distal anterior tibial artery and a moderate to high-grade disease of about 10-12 cm. she had a percutaneous angioplasty of the distal and middle left anterior tibial artery, proximal left anterior tibial artery and distal SFA and proximal popliteal artery. Objective Constitutional Pulse regular. Respirations normal and unlabored. Afebrile. Vitals Time Taken: 11:16 AM, Height: 64 in, Temperature: 98.0 F, Pulse: 74 bpm, Respiratory Rate: 18 breaths/min, Blood Pressure: 148/56 mmHg. Eyes Nonicteric. Reactive to light. Ears, Nose, Mouth, and Throat Lips, teeth, and gums WNL.Marland Kitchen Moist mucosa without lesions. Neck supple and nontender. No  palpable supraclavicular or cervical adenopathy. Normal sized without goiter. Respiratory WNL. No retractions.. Cardiovascular Pedal Pulses WNL. No clubbing, cyanosis or edema. Lymphatic No adneopathy. No adenopathy. No adenopathy. Musculoskeletal Adexa without tenderness or enlargement.. Digits and nails w/o clubbing, cyanosis, infection, petechiae, ischemia, or inflammatory conditions.Marland Kitchen Psychiatric Judgement and insight Intact.. No evidence of depression, anxiety, or agitation.Marland Kitchen Caputi, Camora ([id number]) General Notes: the dorsum of the foot in the region of the ulceration has quite a bit of debris down to the subcutaneous tissue and using a #3 curet I sharply debrided this down to the healthy granulation tissue and bleeding was controlled with pressure Integumentary (Hair, Skin) No suspicious lesions. No crepitus or fluctuance. No peri-wound warmth or erythema. No masses.. Wound #1 status is Open. Original cause of wound was Pressure Injury. The wound is located on the Left Calcaneous. The wound measures 2cm length x 2.3cm width x 0.3cm depth; 3.613cm^2 area and 1.084cm^3 volume. The wound is limited to skin breakdown. There is no tunneling or undermining noted. There is a large amount  of serous drainage noted. The wound margin is indistinct and nonvisible. There is large (67-100%) red, pink granulation within the wound bed. There is a small (1-33%) amount of necrotic tissue within the wound bed including Adherent Slough. The periwound skin appearance exhibited: Maceration, Moist, Erythema. The periwound skin appearance did not exhibit: Callus, Crepitus, Excoriation, Fluctuance, Friable, Induration, Localized Edema, Rash, Scarring, Dry/Scaly, Atrophie Blanche, Cyanosis, Ecchymosis, Hemosiderin Staining, Mottled, Pallor, Rubor. The surrounding wound skin color is noted with erythema which is circumferential. Periwound temperature was noted as No Abnormality. The periwound has  tenderness on palpation. Wound #3 status is Open. Original cause of wound was Gradually Appeared. The wound is located on the Left,Dorsal Foot. The wound measures 0.1cm length x 0.1cm width x 0.1cm depth; 0.008cm^2 area and 0.001cm^3 volume. The wound is limited to skin breakdown. There is no tunneling or undermining noted. There is a medium amount of serosanguineous drainage noted. The wound margin is flat and intact. There is no granulation within the wound bed. There is a large (67-100%) amount of necrotic tissue within the wound bed including Adherent Slough. The periwound skin appearance exhibited: Moist. 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 There was a Skin/Subcutaneous Tissue Debridement (40981-19147) debridement with total area of 4.6 sq cm performed by Evlyn Kanner, MD. with [name] ([id number]) the following instrument(s): Curette to remove Viable and Non-Viable tissue/material including Exudate, Fibrin/Slough, and Subcutaneous after achieving pain control using Other (lidocaine 4% cream). A time out was conducted prior to the start of the procedure. A Minimum amount of bleeding was controlled with Pressure. The procedure was tolerated well with a pain level of 0 throughout and a pain level of 0 following the procedure. Post Debridement Measurements: 2cm length x 2.3cm width x 0.4cm depth; 1.445cm^3 volume. Post debridement Stage noted as Unstageable/Unclassified. Post procedure Diagnosis Wound #1: Same as Pre-Procedure Plan I have recommended Santyl ointment to the left heel to be changed daily and on the left ankle on the dorsum to apply a bordered foam. Once the  granulation tissue fills up better we will try to get her a skin substitute, possibly Apligraf or Grafix. Electronic Signature(s) Signed: 07/18/2015 11:41:23 AM By: Evlyn Kanner MD, FACS Entered By: Evlyn Kanner on 07/18/2015 11:41:22 [name] ([id number]) -------------------------------------------------------------------------------- SuperBill Details Patient Name: [name] Date of Service: 07/18/2015 Medical Record Number: [id number] Patient Account Number: [id number] Date of Birth/Sex: 02/01/1928 (82 y.o. Female) Treating RN: Ashok Cordia, Debi Primary Care Physician: Loney Laurence Other Clinician: Referring Physician: Loney Laurence Treating Physician/Extender: Rudene Re in Treatment: 20 Diagnosis Coding ICD-10 Codes Code Description E11.621 Type 2 diabetes mellitus with foot ulcer L89.620 Pressure ulcer of left heel, unstageable G30.9 Alzheimer's disease, unspecified L97.422 Non-pressure chronic ulcer of left heel and midfoot with fat layer exposed Facility Procedures CPT4 Code Description: 44010272 11042 - DEB SUBQ TISSUE 20 SQ CM/< ICD-10 Description Diagnosis E11.621 Type 2 diabetes mellitus with foot ulcer L89.620 Pressure ulcer of left heel, unstageable G30.9 Alzheimer's disease, unspecified L97.422 Non-pressure  chronic ulcer of left heel and midfoot Modifier: with fat laye Quantity: 1 r exposed Physician Procedures CPT4 Code Description: 5366440 11042 - WC PHYS SUBQ TISS 20 SQ CM ICD-10 Description Diagnosis E11.621 Type 2 diabetes mellitus  with foot ulcer L89.620 Pressure ulcer of left heel, unstageable G30.9 Alzheimer's disease, unspecified L97.422 Non-pressure  chronic ulcer of left heel and midfoot Modifier: with fat laye Quantity: 1 r exposed Electronic Signature(s) Signed: 07/18/2015 11:44:31 AM By: Evlyn Kanner MD, FACS Previous Signature: 07/18/2015 11:41:46 AM Version By: Evlyn Kanner MD, FACS Entered By: Evlyn Kanner on  07/18/2015 11:44:31

## 2015-07-19 NOTE — Progress Notes (Signed)
SHERY, WAUNEKA ([id number]) Visit Report for 07/18/2015 Arrival Information Details Patient Name: [name], [name] Date of Service: 07/18/2015 10:45 AM Medical Record Number: [id number] Patient Account Number: [id number] Date of Birth/Sex: Feb 18, 1928 (82 y.o. Female) Treating RN: Ashok Cordia, Debi Primary Care Physician: Loney Laurence Other Clinician: Referring Physician: Loney Laurence Treating Physician/Extender: Rudene Re in Treatment: 20 Visit Information History Since Last Visit All ordered tests and consults were completed: No Patient Arrived: Wheel Chair Added or deleted any medications: No Arrival Time: 11:15 Any new allergies or adverse reactions: No Accompanied By: caregiver Had a fall or experienced change in No Transfer Assistance: EasyPivot Patient activities of daily living that may affect Lift risk of falls: Patient Identification Verified: Yes Signs or symptoms of abuse/neglect since last No Secondary Verification Process Yes visito Completed: Hospitalized since last visit: No Patient Requires Transmission- No Pain Present Now: No Based Precautions: Patient Has Alerts: Yes Patient Alerts: Patient on Blood Thinner ABI: Non Compressible Electronic Signature(s) Signed: 07/18/2015 7:01:21 PM By: Alejandro Mulling Entered By: Alejandro Mulling on 07/18/2015 11:16:17 Totty, [name] ([id number]) -------------------------------------------------------------------------------- Encounter Discharge Information Details Patient Name: [name] Date of Service: 07/18/2015 10:45 AM Medical Record Number: [id number] Patient Account Number: [id number] Date of Birth/Sex: Oct 04, 1927 (82 y.o. Female) Treating RN: Ashok Cordia, Debi Primary Care Physician: Loney Laurence Other Clinician: Referring Physician: Loney Laurence Treating Physician/Extender: Rudene Re in Treatment: 20 Encounter Discharge Information Items Discharge Pain  Level: 0 Discharge Condition: Stable Ambulatory Status: Wheelchair Nursing Discharge Destination: Home Transportation: Other Accompanied By: caregiver Schedule Follow-up Appointment: Yes Medication Reconciliation completed Yes and provided to Patient/Care Miles Borkowski: Clinical Summary of Care: Electronic Signature(s) Signed: 07/18/2015 7:01:21 PM By: Alejandro Mulling Entered By: Alejandro Mulling on 07/18/2015 11:50:47 Mazzocco, [name] ([id number]) -------------------------------------------------------------------------------- Lower Extremity Assessment Details Patient Name: [name] Date of Service: 07/18/2015 10:45 AM Medical Record Number: [id number] Patient Account Number: [id number] Date of Birth/Sex: 02-07-1928 (82 y.o. Female) Treating RN: Ashok Cordia, Debi Primary Care Physician: Loney Laurence Other Clinician: Referring Physician: Loney Laurence Treating Physician/Extender: Rudene Re in Treatment: 20 Vascular Assessment Pulses: Posterior Tibial Dorsalis Pedis Palpable: [Left:Yes] Extremity colors, hair growth, and conditions: Extremity Color: [Left:Normal] Temperature of Extremity: [Left:Warm] Capillary Refill: [Left:< 3 seconds] Toe Nail Assessment Left: Right: Thick: Yes Discolored: No Deformed: No Improper Length and Hygiene: No Electronic Signature(s) Signed: 07/18/2015 7:01:21 PM By: Alejandro Mulling Entered By: Alejandro Mulling on 07/18/2015 11:19:05 Garmon, Maurita ([id number]) -------------------------------------------------------------------------------- Multi Wound Chart Details Patient Name: [name] Date of Service: 07/18/2015 10:45 AM Medical Record Number: [id number] Patient Account Number: [id number] Date of Birth/Sex: Dec 06, 1927 (82 y.o. Female) Treating RN: Ashok Cordia, Debi Primary Care Physician: Loney Laurence Other Clinician: Referring Physician: Loney Laurence Treating Physician/Extender: Rudene Re in Treatment: 20 Vital Signs Height(in): 64 Pulse(bpm): 74 Weight(lbs): Blood Pressure 148/56 (mmHg): Body Mass Index(BMI): Temperature(F): 98.0 Respiratory Rate 18 (breaths/min): Photos: [1:No Photos] [3:No Photos] [N/A:N/A] Wound Location: [1:Left Calcaneous] [3:Left Foot - Dorsal] [N/A:N/A] Wounding Event: [1:Pressure Injury] [3:Gradually Appeared] [N/A:N/A] Primary Etiology: [1:Pressure Ulcer] [3:Trauma, Other] [N/A:N/A] Comorbid History: [1:Anemia, Hypertension, Type II Diabetes, History of pressure wounds, Osteoarthritis, Dementia] [3:Anemia, Hypertension, Type II Diabetes, History of pressure wounds, Osteoarthritis, Dementia] [N/A:N/A] Date Acquired: [1:01/30/2015] [3:06/19/2015] [N/A:N/A] Weeks of Treatment: [1:20] [3:4] [N/A:N/A] Wound Status: [1:Open] [3:Open] [N/A:N/A] Measurements L x W x D 2x2.3x0.3 [3:0.1x0.1x0.1] [N/A:N/A] (cm) Area (cm) : [1:3.613] [3:0.008] [N/A:N/A] Volume (cm) : [1:1.084] [3:0.001] [N/A:N/A] % Reduction in Area: [1:62.40%] [3:93.70%] [N/A:N/A] % Reduction in Volume: -12.70% [3:92.30%] [N/A:N/A] Classification: [1:Unstageable/Unclassified] [3:Partial Thickness] [N/A:N/A] HBO Classification: [1:Grade 1] [  3:Grade 1] [N/A:N/A] Exudate Amount: [1:Large] [3:Medium] [N/A:N/A] Exudate Type: [1:Serous] [3:Serosanguineous] [N/A:N/A] Exudate Color: [1:amber] [3:red, brown] [N/A:N/A] Wound Margin: [1:Indistinct, nonvisible] [3:Flat and Intact] [N/A:N/A] Granulation Amount: [1:Large (67-100%)] [3:None Present (0%)] [N/A:N/A] Granulation Quality: [1:Red, Pink] [3:N/A] [N/A:N/A] Necrotic Amount: [1:Small (1-33%)] [3:Large (67-100%)] [N/A:N/A] Exposed Structures: [1:Fascia: No Fat: No Tendon: No Muscle: No] [3:Fascia: No Fat: No Tendon: No Muscle: No] [N/A:N/A] Joint: No Joint: No Bone: No Bone: No Limited to Skin Limited to Skin Breakdown Breakdown Epithelialization: None None N/A Periwound Skin Texture: Edema: No No Abnormalities Noted  N/A Excoriation: No Induration: No Callus: No Crepitus: No Fluctuance: No Friable: No Rash: No Scarring: No Periwound Skin Maceration: Yes Moist: Yes N/A Moisture: Moist: Yes Dry/Scaly: No Periwound Skin Color: Erythema: Yes No Abnormalities Noted N/A Atrophie Blanche: No Cyanosis: No Ecchymosis: No Hemosiderin Staining: No Mottled: No Pallor: No Rubor: No Erythema Location: Circumferential N/A N/A Temperature: No Abnormality N/A N/A Tenderness on Yes No N/A Palpation: Wound Preparation: Ulcer Cleansing: Ulcer Cleansing: N/A Rinsed/Irrigated with Rinsed/Irrigated with Saline Saline Topical Anesthetic Topical Anesthetic Applied: Other: lidocaine Applied: Other: lidocaine 4% Treatment Notes Electronic Signature(s) Signed: 07/18/2015 7:01:21 PM By: Alejandro Mulling Entered By: Alejandro Mulling on 07/18/2015 11:32:18 Ambroise, [name] ([id number]) -------------------------------------------------------------------------------- Multi-Disciplinary Care Plan Details Patient Name: [name] Date of Service: 07/18/2015 10:45 AM Medical Record Number: [id number] Patient Account Number: [id number] Date of Birth/Sex: 1927/05/26 (82 y.o. Female) Treating RN: Ashok Cordia, Debi Primary Care Physician: Loney Laurence Other Clinician: Referring Physician: Loney Laurence Treating Physician/Extender: Rudene Re in Treatment: 20 Active Inactive Orientation to the Wound Care Program Nursing Diagnoses: Knowledge deficit related to the wound healing center program Goals: Patient/caregiver will verbalize understanding of the Wound Healing Center Program Date Initiated: 02/27/2015 Goal Status: Active Interventions: Provide education on orientation to the wound center Notes: Pressure Nursing Diagnoses: Knowledge deficit related to management of pressures ulcers Potential for impaired tissue integrity related to pressure, friction, moisture, and  shear Goals: Patient will remain free from development of additional pressure ulcers Date Initiated: 02/27/2015 Goal Status: Active Patient will remain free of pressure ulcers Date Initiated: 02/27/2015 Goal Status: Active Patient/caregiver will verbalize risk factors for pressure ulcer development Date Initiated: 02/27/2015 Goal Status: Active Patient/caregiver will verbalize understanding of pressure ulcer management Date Initiated: 02/27/2015 Goal Status: Active Interventions: Assess: immobility, friction, shearing, incontinence upon admission and as needed Shallenberger, Azariah ([id number]) Assess offloading mechanisms upon admission and as needed Assess potential for pressure ulcer upon admission and as needed Provide education on pressure ulcers Treatment Activities: Patient referred for home evaluation of offloading devices/mattresses : 07/18/2015 Patient referred for pressure reduction/relief devices : 07/18/2015 Notes: Wound/Skin Impairment Nursing Diagnoses: Impaired tissue integrity Knowledge deficit related to ulceration/compromised skin integrity Goals: Patient/caregiver will verbalize understanding of skin care regimen Date Initiated: 02/27/2015 Goal Status: Active Ulcer/skin breakdown will have a volume reduction of 30% by week 4 Date Initiated: 02/27/2015 Goal Status: Active Ulcer/skin breakdown will have a volume reduction of 50% by week 8 Date Initiated: 02/27/2015 Goal Status: Active Ulcer/skin breakdown will have a volume reduction of 80% by week 12 Date Initiated: 02/27/2015 Goal Status: Active Ulcer/skin breakdown will heal within 14 weeks Date Initiated: 02/27/2015 Goal Status: Active Interventions: Assess patient/caregiver ability to obtain necessary supplies Assess patient/caregiver ability to perform ulcer/skin care regimen upon admission and as needed Assess ulceration(s) every visit Provide education on ulcer and skin care Treatment  Activities: Skin care regimen initiated : 07/18/2015 Notes: Electronic Signature(s) VALORY, WETHERBY ([id number]) Signed: 07/18/2015 7:01:21 PM By: Ashok Cordia,  Debra Entered By: Alejandro MullingPinkerton, Debra on 07/18/2015 11:32:09 Peoples, [name] ([id number]) -------------------------------------------------------------------------------- Pain Assessment Details Patient Name: Gloria FateMORDECAI, Sharniece Date of Service: 07/18/2015 10:45 AM Medical Record Number: [id number] Patient Account Number: [id number] Date of Birth/Sex: Mar 23, 1928 (82 y.o. Female) Treating RN: Ashok CordiaPinkerton, Debi Primary Care Physician: Loney LaurenceAMPBELL, MARGARET Other Clinician: Referring Physician: Loney LaurenceAMPBELL, MARGARET Treating Physician/Extender: Rudene ReBritto, Errol Weeks in Treatment: 20 Active Problems Location of Pain Severity and Description of Pain Patient Has Paino No Site Locations Pain Management and Medication Current Pain Management: Electronic Signature(s) Signed: 07/18/2015 7:01:21 PM By: Alejandro MullingPinkerton, Debra Entered By: Alejandro MullingPinkerton, Debra on 07/18/2015 11:16:39 Mizuno, [name] ([id number]) -------------------------------------------------------------------------------- Patient/Caregiver Education Details Patient Name: Gloria FateMORDECAI, Saniya Date of Service: 07/18/2015 10:45 AM Medical Record Number: [id number] Patient Account Number: [id number] Date of Birth/Gender: Mar 23, 1928 (82 y.o. Female) Treating RN: Ashok CordiaPinkerton, Debi Primary Care Physician: Loney LaurenceAMPBELL, MARGARET Other Clinician: Referring Physician: Loney LaurenceAMPBELL, MARGARET Treating Physician/Extender: Rudene ReBritto, Errol Weeks in Treatment: 20 Education Assessment Education Provided To: Patient and Caregiver Education Topics Provided Wound/Skin Impairment: Handouts: Other: change dressing as ordered Methods: Demonstration, Explain/Verbal Responses: State content correctly Electronic Signature(s) Signed: 07/18/2015 7:01:21 PM By: Alejandro MullingPinkerton, Debra Entered By: Alejandro MullingPinkerton, Debra on 07/18/2015  11:28:24 Sykora, [name] ([id number]) -------------------------------------------------------------------------------- Wound Assessment Details Patient Name: Gloria FateMORDECAI, Raygan Date of Service: 07/18/2015 10:45 AM Medical Record Number: [id number] Patient Account Number: [id number] Date of Birth/Sex: Mar 23, 1928 (82 y.o. Female) Treating RN: Ashok CordiaPinkerton, Debi Primary Care Physician: Loney LaurenceAMPBELL, MARGARET Other Clinician: Referring Physician: Loney LaurenceAMPBELL, MARGARET Treating Physician/Extender: Rudene ReBritto, Errol Weeks in Treatment: 20 Wound Status Wound Number: 1 Primary Pressure Ulcer Etiology: Wound Location: Left Calcaneous Wound Open Wounding Event: Pressure Injury Status: Date Acquired: 01/30/2015 Comorbid Anemia, Hypertension, Type II Weeks Of Treatment: 20 History: Diabetes, History of pressure wounds, Clustered Wound: No Osteoarthritis, Dementia Photos Photo Uploaded By: Alejandro MullingPinkerton, Debra on 07/18/2015 17:41:32 Wound Measurements Length: (cm) 2 Width: (cm) 2.3 Depth: (cm) 0.3 Area: (cm) 3.613 Volume: (cm) 1.084 % Reduction in Area: 62.4% % Reduction in Volume: -12.7% Epithelialization: None Tunneling: No Undermining: No Wound Description Classification: Unstageable/Unclassified Foul Od Diabetic Severity (Wagner): Grade 1 Wound Margin: Indistinct, nonvisible Exudate Amount: Large Exudate Type: Serous Exudate Color: amber or After Cleansing: No Wound Bed Granulation Amount: Large (67-100%) Exposed Structure Granulation Quality: Red, Pink Fascia Exposed: No Necrotic Amount: Small (1-33%) Fat Layer Exposed: No Hubbert, Barbarann ([id number]) Necrotic Quality: Adherent Slough Tendon Exposed: No Muscle Exposed: No Joint Exposed: No Bone Exposed: No Limited to Skin Breakdown Periwound Skin Texture Texture Color No Abnormalities Noted: No No Abnormalities Noted: No Callus: No Atrophie Blanche: No Crepitus: No Cyanosis: No Excoriation: No Ecchymosis: No Fluctuance:  No Erythema: Yes Friable: No Erythema Location: Circumferential Induration: No Hemosiderin Staining: No Localized Edema: No Mottled: No Rash: No Pallor: No Scarring: No Rubor: No Moisture Temperature / Pain No Abnormalities Noted: No Temperature: No Abnormality Dry / Scaly: No Tenderness on Palpation: Yes Maceration: Yes Moist: Yes Wound Preparation Ulcer Cleansing: Rinsed/Irrigated with Saline Topical Anesthetic Applied: Other: lidocaine, Treatment Notes Wound #1 (Left Calcaneous) 1. Cleansed with: Clean wound with Normal Saline 2. Anesthetic Topical Lidocaine 4% cream to wound bed prior to debridement 3. Peri-wound Care: Skin Prep 4. Dressing Applied: Santyl Ointment 5. Secondary Dressing Applied Bordered Foam Dressing Dry Gauze Kerlix/Conform 7. Secured with Secretary/administratorTape Electronic Signature(s) Signed: 07/18/2015 7:01:21 PM By: Norman HerrlichPinkerton, Debra Mckeon, Reiko ([id number]) Entered By: Alejandro MullingPinkerton, Debra on 07/18/2015 11:22:34 Gloria FateMORDECAI, Shandora ([id number]) -------------------------------------------------------------------------------- Wound Assessment Details Patient Name: Gloria FateMORDECAI, Yoshi Date of Service: 07/18/2015 10:45 AM Medical Record Number: [id number] Patient Account Number: [id number] Date of Birth/Sex:  Mar 15, 1928 (82 y.o. Female) Treating RN: Ashok Cordia, Debi Primary Care Physician: Loney Laurence Other Clinician: Referring Physician: Loney Laurence Treating Physician/Extender: Rudene Re in Treatment: 20 Wound Status Wound Number: 3 Primary Trauma, Other Etiology: Wound Location: Left Foot - Dorsal Wound Open Wounding Event: Gradually Appeared Status: Date Acquired: 06/19/2015 Comorbid Anemia, Hypertension, Type II Weeks Of Treatment: 4 History: Diabetes, History of pressure wounds, Clustered Wound: No Osteoarthritis, Dementia Photos Photo Uploaded By: Alejandro Mulling on 07/18/2015 17:41:43 Wound Measurements Length: (cm)  0.1 Width: (cm) 0.1 Depth: (cm) 0.1 Area: (cm) 0.008 Volume: (cm) 0.001 % Reduction in Area: 93.7% % Reduction in Volume: 92.3% Epithelialization: None Tunneling: No Undermining: No Wound Description Classification: Partial Thickness Foul Odor A Diabetic Severity (Wagner): Grade 1 Wound Margin: Flat and Intact Exudate Amount: Medium Exudate Type: Serosanguineous Exudate Color: red, brown fter Cleansing: No Wound Bed Granulation Amount: None Present (0%) Exposed Structure Necrotic Amount: Large (67-100%) Fascia Exposed: No Necrotic Quality: Adherent Slough Fat Layer Exposed: No Monter, Therisa ([id number]) Tendon Exposed: No Muscle Exposed: No Joint Exposed: No Bone Exposed: No Limited to Skin Breakdown Periwound Skin Texture Texture Color No Abnormalities Noted: No No Abnormalities Noted: No Moisture No Abnormalities Noted: No Moist: Yes Wound Preparation Ulcer Cleansing: Rinsed/Irrigated with Saline Topical Anesthetic Applied: Other: lidocaine 4%, Treatment Notes Wound #3 (Left, Dorsal Foot) 1. Cleansed with: Clean wound with Normal Saline 2. Anesthetic Topical Lidocaine 4% cream to wound bed prior to debridement 3. Peri-wound Care: Skin Prep 4. Dressing Applied: Prisma Ag 5. Secondary Dressing Applied Bordered Foam Dressing Dry Gauze Electronic Signature(s) Signed: 07/18/2015 7:01:21 PM By: Alejandro Mulling Entered By: Alejandro Mulling on 07/18/2015 11:21:24 [name] ([id number]) -------------------------------------------------------------------------------- Vitals Details Patient Name: [name] Date of Service: 07/18/2015 10:45 AM Medical Record Number: [id number] Patient Account Number: [id number] Date of Birth/Sex: August 09, 1927 (82 y.o. Female) Treating RN: Ashok Cordia, Debi Primary Care Physician: Loney Laurence Other Clinician: Referring Physician: Loney Laurence Treating Physician/Extender: Rudene Re in  Treatment: 20 Vital Signs Time Taken: 11:16 Temperature (F): 98.0 Height (in): 64 Pulse (bpm): 74 Respiratory Rate (breaths/min): 18 Blood Pressure (mmHg): 148/56 Reference Range: 80 - 120 mg / dl Electronic Signature(s) Signed: 07/18/2015 7:01:21 PM By: Alejandro Mulling Entered By: Alejandro Mulling on 07/18/2015 11:17:14

## 2015-07-25 ENCOUNTER — Encounter: Payer: Medicare Other | Admitting: Surgery

## 2015-07-25 DIAGNOSIS — E11621 Type 2 diabetes mellitus with foot ulcer: Secondary | ICD-10-CM | POA: Diagnosis not present

## 2015-07-26 NOTE — Progress Notes (Signed)
LILIAH, DORIAN ([id number]) Visit Report for 07/25/2015 Arrival Information Details Patient Name: ACQUANETTA, CABANILLA Date of Service: 07/25/2015 9:30 AM Medical Record Number: [id number] Patient Account Number: [id number] Date of Birth/Sex: 19-Jul-1927 (81 y.o. Female) Treating RN: Ashok Cordia, Debi Primary Care Physician: Loney Laurence Other Clinician: Referring Physician: Loney Laurence Treating Physician/Extender: Rudene Re in Treatment: 21 Visit Information History Since Last Visit All ordered tests and consults were completed: No Patient Arrived: Wheel Chair Added or deleted any medications: No Arrival Time: 10:16 Any new allergies or adverse reactions: No Accompanied By: caregiver Had a fall or experienced change in No Transfer Assistance: EasyPivot Patient activities of daily living that may affect Lift risk of falls: Patient Identification Verified: Yes Signs or symptoms of abuse/neglect since last No Secondary Verification Process Yes visito Completed: Hospitalized since last visit: No Patient Requires Transmission- No Pain Present Now: No Based Precautions: Patient Has Alerts: Yes Patient Alerts: Patient on Blood Thinner ABI: Non Compressible Electronic Signature(s) Signed: 07/25/2015 3:06:35 PM By: Alejandro Mulling Entered By: Alejandro Mulling on 07/25/2015 10:16:38 Stotts, [name] ([id number]) -------------------------------------------------------------------------------- Clinic Level of Care Assessment Details Patient Name: [name] Date of Service: 07/25/2015 9:30 AM Medical Record Number: [id number] Patient Account Number: [id number] Date of Birth/Sex: November 01, 1927 (82 y.o. Female) Treating RN: Ashok Cordia, Debi Primary Care Physician: Loney Laurence Other Clinician: Referring Physician: Loney Laurence Treating Physician/Extender: Rudene Re in Treatment: 21 Clinic Level of Care Assessment Items TOOL 4 Quantity  Score []  - Use when only an EandM is performed on FOLLOW-UP visit 0 ASSESSMENTS - Nursing Assessment / Reassessment []  - Reassessment of Co-morbidities (includes updates in patient status) 0 X - Reassessment of Adherence to Treatment Plan 1 5 ASSESSMENTS - Wound and Skin Assessment / Reassessment X - Simple Wound Assessment / Reassessment - one wound 1 5 []  - Complex Wound Assessment / Reassessment - multiple wounds 0 []  - Dermatologic / Skin Assessment (not related to wound area) 0 ASSESSMENTS - Focused Assessment []  - Circumferential Edema Measurements - multi extremities 0 []  - Nutritional Assessment / Counseling / Intervention 0 []  - Lower Extremity Assessment (monofilament, tuning fork, pulses) 0 []  - Peripheral Arterial Disease Assessment (using hand held doppler) 0 ASSESSMENTS - Ostomy and/or Continence Assessment and Care []  - Incontinence Assessment and Management 0 []  - Ostomy Care Assessment and Management (repouching, etc.) 0 PROCESS - Coordination of Care []  - Simple Patient / Family Education for ongoing care 0 X - Complex (extensive) Patient / Family Education for ongoing care 1 20 []  - Staff obtains Chiropractor, Records, Test Results / Process Orders 0 X - Staff telephones HHA, Nursing Homes / Clarify orders / etc 1 10 []  - Routine Transfer to another Facility (non-emergent condition) 0 Formosa, Sarajane ([id number]) []  - Routine Hospital Admission (non-emergent condition) 0 []  - New Admissions / Manufacturing engineer / Ordering NPWT, Apligraf, etc. 0 []  - Emergency Hospital Admission (emergent condition) 0 X - Simple Discharge Coordination 1 10 []  - Complex (extensive) Discharge Coordination 0 PROCESS - Special Needs []  - Pediatric / Minor Patient Management 0 []  - Isolation Patient Management 0 []  - Hearing / Language / Visual special needs 0 []  - Assessment of Community assistance (transportation, D/C planning, etc.) 0 []  - Additional assistance / Altered mentation  0 []  - Support Surface(s) Assessment (bed, cushion, seat, etc.) 0 INTERVENTIONS - Wound Cleansing / Measurement X - Simple Wound Cleansing - one wound 1 5 []  - Complex Wound Cleansing - multiple wounds 0 X - Wound Imaging (photographs -  any number of wounds) 1 5 []  - Wound Tracing (instead of photographs) 0 X - Simple Wound Measurement - one wound 1 5 []  - Complex Wound Measurement - multiple wounds 0 INTERVENTIONS - Wound Dressings []  - Small Wound Dressing one or multiple wounds 0 X - Medium Wound Dressing one or multiple wounds 1 15 []  - Large Wound Dressing one or multiple wounds 0 X - Application of Medications - topical 1 5 []  - Application of Medications - injection 0 INTERVENTIONS - Miscellaneous []  - External ear exam 0 Kulinski, Lakoda ([id number]) []  - Specimen Collection (cultures, biopsies, blood, body fluids, etc.) 0 []  - Specimen(s) / Culture(s) sent or taken to Lab for analysis 0 []  - Patient Transfer (multiple staff / Michiel SitesHoyer Lift / Similar devices) 0 []  - Simple Staple / Suture removal (25 or less) 0 []  - Complex Staple / Suture removal (26 or more) 0 []  - Hypo / Hyperglycemic Management (close monitor of Blood Glucose) 0 []  - Ankle / Brachial Index (ABI) - do not check if billed separately 0 X - Vital Signs 1 5 Has the patient been seen at the hospital within the last three years: Yes Total Score: 90 Level Of Care: New/Established - Level 3 Electronic Signature(s) Signed: 07/25/2015 3:06:35 PM By: Alejandro MullingPinkerton, Debra Entered By: Alejandro MullingPinkerton, Debra on 07/25/2015 11:30:34 Herro, [name] ([id number]) -------------------------------------------------------------------------------- Encounter Discharge Information Details Patient Name: Rosie FateMORDECAI, Tamirah Date of Service: 07/25/2015 9:30 AM Medical Record Number: [id number] Patient Account Number: [id number] Date of Birth/Sex: 11/22/1927 (82 y.o. Female) Treating RN: Ashok CordiaPinkerton, Debi Primary Care Physician: Loney LaurenceAMPBELL,  MARGARET Other Clinician: Referring Physician: Loney LaurenceAMPBELL, MARGARET Treating Physician/Extender: Rudene ReBritto, Errol Weeks in Treatment: 21 Encounter Discharge Information Items Discharge Pain Level: 0 Discharge Condition: Stable Ambulatory Status: Wheelchair Discharge Destination: Nursing Home Transportation: Other Accompanied By: caregiver Schedule Follow-up Appointment: Yes Medication Reconciliation completed and provided to Patient/Care Yes Caedin Mogan: Provided on Clinical Summary of Care: 07/25/2015 Form Type Recipient Paper Patient bm Electronic Signature(s) Signed: 07/25/2015 3:06:35 PM By: Alejandro MullingPinkerton, Debra Previous Signature: 07/25/2015 10:44:21 AM Version By: Francie MassingKelly, Tia Entered By: Alejandro MullingPinkerton, Debra on 07/25/2015 10:46:41 Eischeid, Shian ([id number]) -------------------------------------------------------------------------------- Lower Extremity Assessment Details Patient Name: Rosie FateMORDECAI, Lindalou Date of Service: 07/25/2015 9:30 AM Medical Record Number: [id number] Patient Account Number: [id number] Date of Birth/Sex: 11/22/1927 (82 y.o. Female) Treating RN: Ashok CordiaPinkerton, Debi Primary Care Physician: Loney LaurenceAMPBELL, MARGARET Other Clinician: Referring Physician: Loney LaurenceAMPBELL, MARGARET Treating Physician/Extender: Rudene ReBritto, Errol Weeks in Treatment: 21 Vascular Assessment Pulses: Posterior Tibial Dorsalis Pedis Palpable: [Left:Yes] Extremity colors, hair growth, and conditions: Extremity Color: [Left:Normal] Temperature of Extremity: [Left:Warm] Capillary Refill: [Left:< 3 seconds] Toe Nail Assessment Left: Right: Thick: Yes Discolored: No Deformed: No Improper Length and Hygiene: No Electronic Signature(s) Signed: 07/25/2015 3:06:35 PM By: Alejandro MullingPinkerton, Debra Entered By: Alejandro MullingPinkerton, Debra on 07/25/2015 10:29:51 Derossett, [name] ([id number]) -------------------------------------------------------------------------------- Multi Wound Chart Details Patient Name: Rosie FateMORDECAI, Brenetta Date of  Service: 07/25/2015 9:30 AM Medical Record Number: [id number] Patient Account Number: [id number] Date of Birth/Sex: 11/22/1927 (82 y.o. Female) Treating RN: Ashok CordiaPinkerton, Debi Primary Care Physician: Loney LaurenceAMPBELL, MARGARET Other Clinician: Referring Physician: Loney LaurenceAMPBELL, MARGARET Treating Physician/Extender: Rudene ReBritto, Errol Weeks in Treatment: 21 Vital Signs Height(in): 64 Pulse(bpm): 72 Weight(lbs): Blood Pressure 142/62 (mmHg): Body Mass Index(BMI): Temperature(F): 98.0 Respiratory Rate 18 (breaths/min): Photos: [1:No Photos] [3:No Photos] [N/A:N/A] Wound Location: [1:Left Calcaneous] [3:Left, Dorsal Foot] [N/A:N/A] Wounding Event: [1:Pressure Injury] [3:Gradually Appeared] [N/A:N/A] Primary Etiology: [1:Pressure Ulcer] [3:Trauma, Other] [N/A:N/A] Comorbid History: [1:Anemia, Hypertension, Type II Diabetes, History of pressure wounds, Osteoarthritis, Dementia] [3:N/A] [N/A:N/A] Date Acquired: [1:01/30/2015] [3:06/19/2015] [N/A:N/A]  Weeks of Treatment: [1:21] [3:5] [N/A:N/A] Wound Status: [1:Open] [3:Open] [N/A:N/A] Measurements L x W x D 1.3x1x0.2 [3:0.1x0.1x0.1] [N/A:N/A] (cm) Area (cm) : [1:1.021] [3:0.008] [N/A:N/A] Volume (cm) : [1:0.204] [3:0.001] [N/A:N/A] % Reduction in Area: [1:89.40%] [3:93.70%] [N/A:N/A] % Reduction in Volume: 78.80% [3:92.30%] [N/A:N/A] Classification: [1:Unstageable/Unclassified] [3:Partial Thickness] [N/A:N/A] HBO Classification: [1:Grade 1] [3:N/A] [N/A:N/A] Exudate Amount: [1:Large] [3:N/A] [N/A:N/A] Exudate Type: [1:Serous] [3:N/A] [N/A:N/A] Exudate Color: [1:amber] [3:N/A] [N/A:N/A] Wound Margin: [1:Indistinct, nonvisible] [3:N/A] [N/A:N/A] Granulation Amount: [1:Large (67-100%)] [3:N/A] [N/A:N/A] Granulation Quality: [1:Red, Pink] [3:N/A] [N/A:N/A] Necrotic Amount: [1:Small (1-33%)] [3:N/A] [N/A:N/A] Exposed Structures: [1:Fascia: No Fat: No Tendon: No Muscle: No] [3:N/A] [N/A:N/A] Joint: No Bone: No Limited to  Skin Breakdown Epithelialization: None N/A N/A Periwound Skin Texture: Edema: No No Abnormalities Noted N/A Excoriation: No Induration: No Callus: No Crepitus: No Fluctuance: No Friable: No Rash: No Scarring: No Periwound Skin Maceration: Yes No Abnormalities Noted N/A Moisture: Moist: Yes Dry/Scaly: No Periwound Skin Color: Erythema: Yes No Abnormalities Noted N/A Atrophie Blanche: No Cyanosis: No Ecchymosis: No Hemosiderin Staining: No Mottled: No Pallor: No Rubor: No Erythema Location: Circumferential N/A N/A Temperature: No Abnormality N/A N/A Tenderness on Yes No N/A Palpation: Wound Preparation: Ulcer Cleansing: N/A N/A Rinsed/Irrigated with Saline Topical Anesthetic Applied: Other: lidocaine Treatment Notes Electronic Signature(s) Signed: 07/25/2015 3:06:35 PM By: Alejandro Mulling Entered By: Alejandro Mulling on 07/25/2015 10:36:07 [name] ([id number]) -------------------------------------------------------------------------------- Multi-Disciplinary Care Plan Details Patient Name: [name] Date of Service: 07/25/2015 9:30 AM Medical Record Number: [id number] Patient Account Number: [id number] Date of Birth/Sex: [birth year] (82 y.o. Female) Treating RN: Ashok Cordia, Debi Primary Care Physician: Loney Laurence Other Clinician: Referring Physician: Loney Laurence Treating Physician/Extender: Rudene Re in Treatment: 21 Active Inactive Orientation to the Wound Care Program Nursing Diagnoses: Knowledge deficit related to the wound healing center program Goals: Patient/caregiver will verbalize understanding of the Wound Healing Center Program Date Initiated: 02/27/2015 Goal Status: Active Interventions: Provide education on orientation to the wound center Notes: Pressure Nursing Diagnoses: Knowledge deficit related to management of pressures ulcers Potential for impaired tissue integrity related to pressure, friction,  moisture, and shear Goals: Patient will remain free from development of additional pressure ulcers Date Initiated: 02/27/2015 Goal Status: Active Patient will remain free of pressure ulcers Date Initiated: 02/27/2015 Goal Status: Active Patient/caregiver will verbalize risk factors for pressure ulcer development Date Initiated: 02/27/2015 Goal Status: Active Patient/caregiver will verbalize understanding of pressure ulcer management Date Initiated: 02/27/2015 Goal Status: Active Interventions: Assess: immobility, friction, shearing, incontinence upon admission and as needed Lindamood, Shakeitha ([id number]) Assess offloading mechanisms upon admission and as needed Assess potential for pressure ulcer upon admission and as needed Provide education on pressure ulcers Treatment Activities: Patient referred for home evaluation of offloading devices/mattresses : 07/25/2015 Patient referred for pressure reduction/relief devices : 07/25/2015 Notes: Wound/Skin Impairment Nursing Diagnoses: Impaired tissue integrity Knowledge deficit related to ulceration/compromised skin integrity Goals: Patient/caregiver will verbalize understanding of skin care regimen Date Initiated: 02/27/2015 Goal Status: Active Ulcer/skin breakdown will have a volume reduction of 30% by week 4 Date Initiated: 02/27/2015 Goal Status: Active Ulcer/skin breakdown will have a volume reduction of 50% by week 8 Date Initiated: 02/27/2015 Goal Status: Active Ulcer/skin breakdown will have a volume reduction of 80% by week 12 Date Initiated: 02/27/2015 Goal Status: Active Ulcer/skin breakdown will heal within 14 weeks Date Initiated: 02/27/2015 Goal Status: Active Interventions: Assess patient/caregiver ability to obtain necessary supplies Assess patient/caregiver ability to perform ulcer/skin care regimen upon admission and as needed Assess ulceration(s) every visit Provide education on  ulcer and skin care Treatment  Activities: Skin care regimen initiated : 07/25/2015 Notes: Electronic Signature(s) ZAYLIA, RIOLO ([id number]) Signed: 07/25/2015 3:06:35 PM By: Alejandro Mulling Entered By: Alejandro Mulling on 07/25/2015 10:35:57 More, Aiyla ([id number]) -------------------------------------------------------------------------------- Pain Assessment Details Patient Name: [name] Date of Service: 07/25/2015 9:30 AM Medical Record Number: [id number] Patient Account Number: [id number] Date of Birth/Sex: 1928-03-01 (82 y.o. Female) Treating RN: Ashok Cordia, Debi Primary Care Physician: Loney Laurence Other Clinician: Referring Physician: Loney Laurence Treating Physician/Extender: Rudene Re in Treatment: 21 Active Problems Location of Pain Severity and Description of Pain Patient Has Paino No Site Locations Pain Management and Medication Current Pain Management: Electronic Signature(s) Signed: 07/25/2015 3:06:35 PM By: Alejandro Mulling Entered By: Alejandro Mulling on 07/25/2015 10:16:43 [place], [name] ([id number]) -------------------------------------------------------------------------------- Patient/Caregiver Education Details Patient Name: [name] Date of Service: 07/25/2015 9:30 AM Medical Record Number: [id number] Patient Account Number: [id number] Date of Birth/Gender: 03/03/1928 (82 y.o. Female) Treating RN: Ashok Cordia, Debi Primary Care Physician: Loney Laurence Other Clinician: Referring Physician: Loney Laurence Treating Physician/Extender: Rudene Re in Treatment: 21 Education Assessment Education Provided To: Patient and Caregiver Education Topics Provided Wound/Skin Impairment: Handouts: Other: change dressing as ordered Methods: Demonstration, Explain/Verbal Responses: State content correctly Electronic Signature(s) Signed: 07/25/2015 3:06:35 PM By: Alejandro Mulling Entered By: Alejandro Mulling on 07/25/2015  10:31:21 Pestka, [name] ([id number]) -------------------------------------------------------------------------------- Wound Assessment Details Patient Name: [name] Date of Service: 07/25/2015 9:30 AM Medical Record Number: [id number] Patient Account Number: [id number] Date of Birth/Sex: 04/17/1928 (82 y.o. Female) Treating RN: Ashok Cordia, Debi Primary Care Physician: Loney Laurence Other Clinician: Referring Physician: Loney Laurence Treating Physician/Extender: Rudene Re in Treatment: 21 Wound Status Wound Number: 1 Primary Pressure Ulcer Etiology: Wound Location: Left Calcaneous Wound Open Wounding Event: Pressure Injury Status: Date Acquired: 01/30/2015 Comorbid Anemia, Hypertension, Type II Weeks Of Treatment: 21 History: Diabetes, History of pressure wounds, Clustered Wound: No Osteoarthritis, Dementia Photos Photo Uploaded By: Alejandro Mulling on 07/25/2015 14:46:20 Wound Measurements Length: (cm) 1.3 Width: (cm) 1 Depth: (cm) 0.2 Area: (cm) 1.021 Volume: (cm) 0.204 % Reduction in Area: 89.4% % Reduction in Volume: 78.8% Epithelialization: None Tunneling: No Undermining: No Wound Description Classification: Unstageable/Unclassified Foul Od Diabetic Severity (Wagner): Grade 1 Wound Margin: Indistinct, nonvisible Exudate Amount: Large Exudate Type: Serous Exudate Color: amber or After Cleansing: No Wound Bed Granulation Amount: Large (67-100%) Exposed Structure Granulation Quality: Red, Pink Fascia Exposed: No Necrotic Amount: Small (1-33%) Fat Layer Exposed: No Aprea, Taydem ([id number]) Necrotic Quality: Adherent Slough Tendon Exposed: No Muscle Exposed: No Joint Exposed: No Bone Exposed: No Limited to Skin Breakdown Periwound Skin Texture Texture Color No Abnormalities Noted: No No Abnormalities Noted: No Callus: No Atrophie Blanche: No Crepitus: No Cyanosis: No Excoriation: No Ecchymosis: No Fluctuance:  No Erythema: Yes Friable: No Erythema Location: Circumferential Induration: No Hemosiderin Staining: No Localized Edema: No Mottled: No Rash: No Pallor: No Scarring: No Rubor: No Moisture Temperature / Pain No Abnormalities Noted: No Temperature: No Abnormality Dry / Scaly: No Tenderness on Palpation: Yes Maceration: Yes Moist: Yes Wound Preparation Ulcer Cleansing: Rinsed/Irrigated with Saline Topical Anesthetic Applied: Other: lidocaine, Treatment Notes Wound #1 (Left Calcaneous) 1. Cleansed with: Clean wound with Normal Saline 2. Anesthetic Topical Lidocaine 4% cream to wound bed prior to debridement 3. Peri-wound Care: Skin Prep 4. Dressing Applied: Santyl Ointment 5. Secondary Dressing Applied Bordered Foam Dressing Dry Gauze Electronic Signature(s) Signed: 07/25/2015 3:06:35 PM By: Alejandro Mulling Entered By: Alejandro Mulling on 07/25/2015 10:23:02 AZALEAH, USMAN ([id number]) Inda, Ajooni ([id number]) -------------------------------------------------------------------------------- Wound Assessment Details Patient  Name: KALIN, AMRHEIN Date of Service: 07/25/2015 9:30 AM Medical Record Number: [id number] Patient Account Number: [id number] Date of Birth/Sex: Jul 06, 1927 (82 y.o. Female) Treating RN: Ashok Cordia, Debi Primary Care Physician: Loney Laurence Other Clinician: Referring Physician: Loney Laurence Treating Physician/Extender: Rudene Re in Treatment: 21 Wound Status Wound Number: 3 Primary Etiology: Trauma, Other Wound Location: Left, Dorsal Foot Wound Status: Healed - Epithelialized Wounding Event: Gradually Appeared Date Acquired: 06/19/2015 Weeks Of Treatment: 5 Clustered Wound: No Photos Photo Uploaded By: Alejandro Mulling on 07/25/2015 14:46:29 Wound Measurements Length: (cm) 0 % Reduction Width: (cm) 0 % Reduction Depth: (cm) 0 Area: (cm) 0 Volume: (cm) 0 in Area: 100% in Volume: 100% Wound  Description Classification: Partial Thickness Periwound Skin Texture Texture Color No Abnormalities Noted: No No Abnormalities Noted: No Moisture No Abnormalities Noted: No Electronic Signature(s) Signed: 07/25/2015 3:06:35 PM By: Norman Herrlich, Aydin ([id number]) Entered By: Alejandro Mulling on 07/25/2015 10:36:46 Bacigalupi, [name] ([id number]) -------------------------------------------------------------------------------- Vitals Details Patient Name: [name] Date of Service: 07/25/2015 9:30 AM Medical Record Number: [id number] Patient Account Number: [id number] Date of Birth/Sex: Jun 10, 1927 (82 y.o. Female) Treating RN: Ashok Cordia, Debi Primary Care Physician: Loney Laurence Other Clinician: Referring Physician: Loney Laurence Treating Physician/Extender: Rudene Re in Treatment: 21 Vital Signs Time Taken: 10:16 Temperature (F): 98.0 Height (in): 64 Pulse (bpm): 72 Respiratory Rate (breaths/min): 18 Blood Pressure (mmHg): 142/62 Reference Range: 80 - 120 mg / dl Electronic Signature(s) Signed: 07/25/2015 3:06:35 PM By: Alejandro Mulling Entered By: Alejandro Mulling on 07/25/2015 10:30:39

## 2015-07-26 NOTE — Progress Notes (Signed)
[name], [name] ([id number]) Visit Report for 07/25/2015 Chief Complaint Document Details Patient Name: [name], [name] Date of Service: 07/25/2015 9:30 AM Medical Record Number: [id number] Patient Account Number: [id number] Date of Birth/Sex: 12/02/1927 (82 y.o. Female) Treating RN: Ashok Cordia, Debi Primary Care Physician: Loney Laurence Other Clinician: Referring Physician: Loney Laurence Treating Physician/Extender: Rudene Re in Treatment: 21 Information Obtained from: Patient Chief Complaint patient is back to see [place] today for diabetic foot ulcer which she's had for several months. Electronic Signature(s) Signed: 07/25/2015 10:46:56 AM By: Evlyn Kanner MD, FACS Entered By: Evlyn Kanner on 07/25/2015 10:46:56 [name] ([id number]) -------------------------------------------------------------------------------- HPI Details Patient Name: [name] Date of Service: 07/25/2015 9:30 AM Medical Record Number: [id number] Patient Account Number: [id number] Date of Birth/Sex: Aug 24, 1927 (82 y.o. Female) Treating RN: Ashok Cordia, Debi Primary Care Physician: Loney Laurence Other Clinician: Referring Physician: Loney Laurence Treating Physician/Extender: Rudene Re in Treatment: 21 History of Present Illness Location: left heel and dorsum of the foot Quality: Patient reports No Pain. Severity: Patient states wound (s) are getting better. Duration: the patient has had surgery in early December and had vascular testing ordered but not done. Timing: Pain in wound is Intermittent (comes and goes Context: The wound appeared gradually over time Modifying Factors: Other treatment(s) tried include: local dressings and offloading. surgery done by Dr. Graciela Husbands for debridement of the left heel wound Associated Signs and Symptoms: Patient reports having difficulty standing for long periods. HPI Description: 82 year old patient with past medical history  of Alzheimer's dementia, diabetes mellitus, hypertension and recent history of right hip arthroplasty in August 2016. Her past medical history is significant also for hypothyroidism, hypertension, urinary retention, dementia. 04/11/2015 -- she has had a lot of pain and left heel and there has been a lot of drainage today with purulent material. He has not been running fever. 04/18/2015 -- last week she was here with a florid infection of her left heel and I had sent her to the hospital for admission. She was admitted between 12/ 2 and 12/ 5 and she underwent a debridement of the left heel abscess with placement of antibiotic beads with vancomycin, by Dr. Linus Galas. Her cultures grew gram- negative bacteria, was given vancomycin and Unasyn IV in hospital and she was continued on oral antibiotics and was given documented for 10 days. at the time of discharge Dr. Alberteen Spindle wanted the dressing to be intact for about a week before it would need changing. 05/29/2014 -- she has not been to see [place] for the last 6 weeks and from what I understand she has been in rehabilitation facility. No vascular testing was done as we had requested. She has a diabetic foot ulcer which was graded as a Wagner 3 when she was seen last about a month and a half ago. There is no family member to discuss hyperbaric oxygen therapy and the patient has as Alzheimer's dementia and I do not believe she understands our discussions 06/12/2015 -- a vascular appointment is pending for tomorrow. 06/19/2015 -- the vascular workup revealed that the left lower extremity had a 50-99% stenosis in the left superficial femoral, popliteal and tibioperoneal trunk. 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 Dr. Wyn Quaker has recommended angiographically and possible stent placement for this critical ischemia. 07/03/2015 -- he is going to have a stent placement done by Dr. Wyn Quaker this coming Monday. 07/11/2015 -- she was taken for an  aortogram and found to have diffuse disease SFA with a high-grade near occlusive stenosis  in the distal SFA just above Hunter's canal. One-vessel runoff being the wound area and to get tibial artery at the proximal anterior tibial artery had a short segment occlusion in the distal anterior tibial artery and a moderate to high-grade disease of about 10-12 cm. she had a percutaneous angioplasty of the distal and middle left anterior tibial artery, proximal left anterior tibial artery and distal SFA and proximal popliteal artery. [name], [name] ([id number]) Electronic Signature(s) Signed: 07/25/2015 10:47:04 AM By: Evlyn Kanner MD, FACS Entered By: Evlyn Kanner on 07/25/2015 10:47:04 [name] ([id number]) -------------------------------------------------------------------------------- Physical Exam Details Patient Name: [name] Date of Service: 07/25/2015 9:30 AM Medical Record Number: [id number] Patient Account Number: [id number] Date of Birth/Sex: 06/01/1927 (82 y.o. Female) Treating RN: Ashok Cordia, Debi Primary Care Physician: Loney Laurence Other Clinician: Referring Physician: Loney Laurence Treating Physician/Extender: Rudene Re in Treatment: 21 Constitutional . Pulse regular. Respirations normal and unlabored. Afebrile. . Eyes Nonicteric. Reactive to light. Ears, Nose, Mouth, and Throat Lips, teeth, and gums WNL.Marland Kitchen Moist mucosa without lesions. Neck supple and nontender. No palpable supraclavicular or cervical adenopathy. Normal sized without goiter. Respiratory WNL. No retractions.. Cardiovascular Pedal Pulses WNL. No clubbing, cyanosis or edema. Lymphatic No adneopathy. No adenopathy. No adenopathy. Musculoskeletal Adexa without tenderness or enlargement.. Digits and nails w/o clubbing, cyanosis, infection, petechiae, ischemia, or inflammatory conditions.. Integumentary (Hair, Skin) No suspicious lesions. No crepitus or fluctuance. No  peri-wound warmth or erythema. No masses.Marland Kitchen Psychiatric Judgement and insight Intact.. No evidence of depression, anxiety, or agitation.. Notes the wound looks excellent after washing off all the debris with moist saline gauze and the base of the ulcer is clean with healthy granulation tissue Electronic Signature(s) Signed: 07/25/2015 10:47:32 AM By: Evlyn Kanner MD, FACS Entered By: Evlyn Kanner on 07/25/2015 10:47:31 [name] ([id number]) -------------------------------------------------------------------------------- Physician Orders Details Patient Name: [name] Date of Service: 07/25/2015 9:30 AM Medical Record Number: [id number] Patient Account Number: [id number] Date of Birth/Sex: [birth year] (82 y.o. Female) Treating RN: Ashok Cordia, Debi Primary Care Physician: Loney Laurence Other Clinician: Referring Physician: Loney Laurence Treating Physician/Extender: Rudene Re in Treatment: 21 Verbal / Phone Orders: Yes Clinician: Ashok Cordia, Debi Read Back and Verified: Yes Diagnosis Coding Wound Cleansing Wound #1 Left Calcaneous o Clean wound with Normal Saline. o May Shower, gently pat wound dry prior to applying new dressing. Anesthetic Wound #1 Left Calcaneous o Topical Lidocaine 4% cream applied to wound bed prior to debridement Skin Barriers/Peri-Wound Care Wound #1 Left Calcaneous o Skin Prep Primary Wound Dressing Wound #1 Left Calcaneous o Santyl Ointment Secondary Dressing Wound #1 Left Calcaneous o Dry Gauze o Boardered Foam Dressing Dressing Change Frequency Wound #1 Left Calcaneous o Change dressing every other day. - for home health purposes Follow-up Appointments Wound #1 Left Calcaneous o Return Appointment in 1 week. Off-Loading Wound #1 Left Calcaneous o Heel suspension boot to: - sage boots PEG ASSIST SHOE o Turn and reposition every 2 hours [name], [name] ([id number]) o Other: - FLOAT  HEELS WHEN LYING IN BED Home Health Wound #1 Left Calcaneous o Continue Home Health Visits - [facility] *****PLEASE CHANGE YOUR DAY TO MONDAYS******* PT IS BEING SEEN IN THE CLINIC ON THURSDAYS NOW******* o Home Health Nurse may visit PRN to address patientos wound care needs. o FACE TO FACE ENCOUNTER: MEDICARE and MEDICAID PATIENTS: I certify that this patient is under my care and that I had a face-to-face encounter that meets the physician face-to-face encounter requirements with this patient on this date. The encounter with the patient was  in whole or in part for the following MEDICAL CONDITION: (primary reason for Home Healthcare) MEDICAL NECESSITY: I certify, that based on my findings, NURSING services are a medically necessary home health service. HOME BOUND STATUS: I certify that my clinical findings support that this patient is homebound (i.e., Due to illness or injury, pt requires aid of supportive devices such as crutches, cane, wheelchairs, walkers, the use of special transportation or the assistance of another person to leave their place of residence. There is a normal inability to leave the home and doing so requires considerable and taxing effort. Other absences are for medical reasons / religious services and are infrequent or of short duration when for other reasons). o If current dressing causes regression in wound condition, may D/C ordered dressing product/s and apply Normal Saline Moist Dressing daily until next Wound Healing Center / Other MD appointment. Notify Wound Healing Center of regression in wound condition at [phone]. o Please direct any NON-WOUND related issues/requests for orders to patient's Primary Care Physician Electronic Signature(s) Signed: 07/25/2015 3:06:35 PM By: Alejandro Mulling Signed: 07/25/2015 3:37:46 PM By: Evlyn Kanner MD, FACS Entered By: Alejandro Mulling on 07/25/2015 10:45:54 [name], [name]  ([id number]) -------------------------------------------------------------------------------- Problem List Details Patient Name: [name] Date of Service: 07/25/2015 9:30 AM Medical Record Number: [id number] Patient Account Number: [id number] Date of Birth/Sex: [birth year] (82 y.o. 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 Inactive Problems Resolved Problems ICD-10 Code Description Active Date Resolved Date L89.521 Pressure ulcer of left ankle, stage 1 02/27/2015 02/27/2015 L03.116 Cellulitis of left lower limb 04/11/2015 04/11/2015 Electronic Signature(s) Signed: 07/25/2015 10:46:47 AM By: Evlyn Kanner MD, FACS Entered By: Evlyn Kanner on 07/25/2015 10:46:47 [name], [name] ([id number]) -------------------------------------------------------------------------------- Progress Note Details Patient Name: [name] Date of Service: 07/25/2015 9:30 AM Medical Record Number: [id number] Patient Account Number: [id number] Date of Birth/Sex: 1927/12/18 (82 y.o. Female) Treating RN: Ashok Cordia, Debi Primary Care Physician: Loney Laurence Other Clinician: Referring Physician: Loney Laurence Treating Physician/Extender: Rudene Re in Treatment: 21 Subjective Chief Complaint Information obtained from Patient patient is back to see [place] today for diabetic foot ulcer which she's had for several months. History of Present Illness (HPI) The following HPI  elements were documented for the patient's wound: Location: left heel and dorsum of the foot Quality: Patient reports No Pain. Severity: Patient states wound (s) are getting better. Duration: the patient has had surgery in early December and had vascular testing ordered but not done. Timing: Pain in wound is Intermittent (comes and goes Context: The wound appeared gradually over time Modifying Factors: Other treatment(s) tried include: local dressings and offloading. surgery done by Dr. Graciela Husbands for debridement of the left heel wound Associated Signs and Symptoms: Patient reports having difficulty standing for long periods. 82 year old patient with past medical history of Alzheimer's dementia, diabetes mellitus, hypertension and recent history of right hip arthroplasty in August 2016. Her past medical history is significant also for hypothyroidism, hypertension, urinary retention, dementia. 04/11/2015 -- she has had a lot of pain and left heel and there has been a lot of drainage today with purulent material. He has not been running fever.  04/18/2015 -- last week she was here with a florid infection of her left heel and I had sent her to the hospital for admission. She was admitted between 12/ 2 and 12/ 5 and she underwent a debridement of the left heel abscess with placement of antibiotic beads with vancomycin, by Dr. Linus Galasodd Cline. Her cultures grew gram- negative bacteria, was given vancomycin and Unasyn IV in hospital and she was continued on oral antibiotics and was given documented for 10 days. at the time of discharge Dr. Alberteen Spindleline wanted the dressing to be intact for about a week before it would need changing. 05/29/2014 -- she has not been to see us for the last 6 weeks and from what I understand she has been in rehabilitation facility. No vascular testing was done as we had requested. She has a diabetic foot ulcer which was graded as a Wagner 3 when she was seen last about a month and a half  ago. There is no family member to discuss hyperbaric oxygen therapy and the patient has as Alzheimer's dementia and I do not believe she understands our discussions 06/12/2015 -- a vascular appointment is pending for tomorrow. 06/19/2015 -- the vascular workup revealed that the left lower extremity had a 50-99% stenosis in the left superficial femoral, popliteal and tibioperoneal trunk. 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 Dr. Wyn Quakerew has recommended angiographically and possible stent placement for this critical ischemia. Gloria FateMORDECAI, [name] ([id number]) 07/03/2015 -- he is going to have a stent placement done by Dr. Wyn Quakerew this coming Monday. 07/11/2015 -- she was taken for an aortogram and found to have diffuse disease SFA with a high-grade near occlusive stenosis in the distal SFA just above Hunter's canal. One-vessel runoff being the wound area and to get tibial artery at the proximal anterior tibial artery had a short segment occlusion in the distal anterior tibial artery and a moderate to high-grade disease of about 10-12 cm. she had a percutaneous angioplasty of the distal and middle left anterior tibial artery, proximal left anterior tibial artery and distal SFA and proximal popliteal artery. Objective Constitutional Pulse regular. Respirations normal and unlabored. Afebrile. Vitals Time Taken: 10:16 AM, Height: 64 in, Temperature: 98.0 F, Pulse: 72 bpm, Respiratory Rate: 18 breaths/min, Blood Pressure: 142/62 mmHg. Eyes Nonicteric. Reactive to light. Ears, Nose, Mouth, and Throat Lips, teeth, and gums WNL.Marland Kitchen. Moist mucosa without lesions. Neck supple and nontender. No palpable supraclavicular or cervical adenopathy. Normal sized without goiter. Respiratory WNL. No retractions.. Cardiovascular Pedal Pulses WNL. No clubbing, cyanosis or edema. Lymphatic No adneopathy. No adenopathy. No adenopathy. Musculoskeletal Adexa without tenderness or enlargement..  Digits and nails w/o clubbing, cyanosis, infection, petechiae, ischemia, or inflammatory conditions.Marland Kitchen. Psychiatric Judgement and insight Intact.. No evidence of depression, anxiety, or agitation.. General Notes: the wound looks excellent after washing off all the debris with moist saline gauze and the [name], [name] ([id number]) base of the ulcer is clean with healthy granulation tissue Integumentary (Hair, Skin) No suspicious lesions. No crepitus or fluctuance. No peri-wound warmth or erythema. No masses.. Wound #1 status is Open. Original cause of wound was Pressure Injury. The wound is located on the Left Calcaneous. The wound measures 1.3cm length x 1cm width x 0.2cm depth; 1.021cm^2 area and 0.204cm^3 volume. The wound is limited to skin breakdown. There is no tunneling or undermining noted. There is a large amount of serous drainage noted. The wound margin is indistinct and nonvisible. There is large (67-100%) red, pink granulation within the wound  bed. There is a small (1-33%) amount of necrotic tissue within the wound bed including Adherent Slough. The periwound skin appearance exhibited: Maceration, Moist, Erythema. The periwound skin appearance did not exhibit: Callus, Crepitus, Excoriation, Fluctuance, Friable, Induration, Localized Edema, Rash, Scarring, Dry/Scaly, Atrophie Blanche, Cyanosis, Ecchymosis, Hemosiderin Staining, Mottled, Pallor, Rubor. The surrounding wound skin color is noted with erythema which is circumferential. Periwound temperature was noted as No Abnormality. The periwound has tenderness on palpation. Wound #3 status is Healed - Epithelialized. Original cause of wound was Gradually Appeared. The wound is located on the Left,Dorsal Foot. The wound measures 0cm length x 0cm width x 0cm depth; 0cm^2 area and 0cm^3 volume. Assessment Active Problems ICD-10 E11.621 - Type 2 diabetes mellitus with foot ulcer L89.620 - Pressure ulcer of left heel,  unstageable G30.9 - Alzheimer's disease, unspecified L97.422 - Non-pressure chronic ulcer of left heel and midfoot with fat layer exposed Plan Wound Cleansing: Wound #1 Left Calcaneous: Clean wound with Normal Saline. May Shower, gently pat wound dry prior to applying new dressing. Anesthetic: Wound #1 Left Calcaneous: Topical Lidocaine 4% cream applied to wound bed prior to debridement Skin Barriers/Peri-Wound Care: Wound #1 Left Calcaneous: Kulik, Alizah ([id number]) Skin Prep Primary Wound Dressing: Wound #1 Left Calcaneous: Santyl Ointment Secondary Dressing: Wound #1 Left Calcaneous: Dry Gauze Boardered Foam Dressing Dressing Change Frequency: Wound #1 Left Calcaneous: Change dressing every other day. - for home health purposes Follow-up Appointments: Wound #1 Left Calcaneous: Return Appointment in 1 week. Off-Loading: Wound #1 Left Calcaneous: Heel suspension boot to: - sage boots PEG ASSIST SHOE Turn and reposition every 2 hours Other: - FLOAT HEELS WHEN LYING IN BED Home Health: Wound #1 Left Calcaneous: Continue Home Health Visits - [facility] *****PLEASE CHANGE YOUR DAY TO MONDAYS******* PT IS BEING SEEN IN THE CLINIC ON THURSDAYS NOW******* Home Health Nurse may visit PRN to address patient s wound care needs. FACE TO FACE ENCOUNTER: MEDICARE and MEDICAID PATIENTS: I certify that this patient is under my care and that I had a face-to-face encounter that meets the physician face-to-face encounter requirements with this patient on this date. The encounter with the patient was in whole or in part for the following MEDICAL CONDITION: (primary reason for Home Healthcare) MEDICAL NECESSITY: I certify, that based on my findings, NURSING services are a medically necessary home health service. HOME BOUND STATUS: I certify that my clinical findings support that this patient is homebound (i.e., Due to illness or injury, pt requires aid of supportive devices such as  crutches, cane, wheelchairs, walkers, the use of special transportation or the assistance of another person to leave their place of residence. There is a normal inability to leave the home and doing so requires considerable and taxing effort. Other absences are for medical reasons / religious services and are infrequent or of short duration when for other reasons). If current dressing causes regression in wound condition, may D/C ordered dressing product/s and apply Normal Saline Moist Dressing daily until next Wound Healing Center / Other MD appointment. Notify Wound Healing Center of regression in wound condition at [phone]. Please direct any NON-WOUND related issues/requests for orders to patient's Primary Care Physician I have recommended Santyl ointment to the left heel to be changed daily and on the left ankle on the dorsum to apply a bordered foam. I'm very pleased with the overall progress she's been making and once the granulation tissue fills up better we will try to get her a skin substitute, possibly Apligraf or Grafix. [name],  [name] ([id number]) Electronic Signature(s) Signed: 07/25/2015 10:48:31 AM By: Evlyn Kanner MD, FACS Entered By: Evlyn Kanner on 07/25/2015 10:48:31 [name] ([id number]) -------------------------------------------------------------------------------- SuperBill Details Patient Name: [name] Date of Service: 07/25/2015 Medical Record Number: [id number] Patient Account Number: [id number] Date of Birth/Sex: November 18, 1927 (82 y.o. Female) Treating RN: Ashok Cordia, Debi Primary Care Physician: Loney Laurence Other Clinician: Referring Physician: Loney Laurence Treating Physician/Extender: Rudene Re in Treatment: 21 Diagnosis Coding ICD-10 Codes Code Description E11.621 Type 2 diabetes mellitus with foot ulcer L89.620 Pressure ulcer of left heel, unstageable G30.9 Alzheimer's disease, unspecified L97.422 Non-pressure  chronic ulcer of left heel and midfoot with fat layer exposed Facility Procedures CPT4 Code: [id number] Description: 99213 - WOUND CARE VISIT-LEV 3 EST PT Modifier: Quantity: 1 Physician Procedures CPT4 Code Description: [id number] 99213 - WC PHYS LEVEL 3 - EST PT ICD-10 Description Diagnosis E11.621 Type 2 diabetes mellitus with foot ulcer L89.620 Pressure ulcer of left heel, unstageable G30.9 Alzheimer's disease, unspecified L97.422 Non-pressure  chronic ulcer of left heel and midfoo Modifier: t with fat laye Quantity: 1 r exposed Electronic Signature(s) Signed: 07/25/2015 3:06:35 PM By: Alejandro Mulling Signed: 07/25/2015 3:37:46 PM By: Evlyn Kanner MD, FACS Previous Signature: 07/25/2015 10:48:47 AM Version By: Evlyn Kanner MD, FACS Entered By: Alejandro Mulling on 07/25/2015 11:30:43

## 2015-08-01 ENCOUNTER — Encounter (HOSPITAL_BASED_OUTPATIENT_CLINIC_OR_DEPARTMENT_OTHER): Payer: Medicare Other | Admitting: General Surgery

## 2015-08-01 ENCOUNTER — Encounter: Payer: Self-pay | Admitting: General Surgery

## 2015-08-01 DIAGNOSIS — L89623 Pressure ulcer of left heel, stage 3: Secondary | ICD-10-CM

## 2015-08-01 DIAGNOSIS — E11621 Type 2 diabetes mellitus with foot ulcer: Secondary | ICD-10-CM | POA: Diagnosis not present

## 2015-08-01 NOTE — Progress Notes (Signed)
See [facility] note 

## 2015-08-03 NOTE — Progress Notes (Signed)
[name], Analyce ([id number]) Visit Report for 08/01/2015 Chief Complaint Document Details Patient Name: [name], Shavonta Date of Service: 08/01/2015 1:30 PM Medical Record Number: [id number] Patient Account Number: [id number] Date of Birth/Sex: 1927-07-26 (82 y.o. Female) Treating RN: Curtis Sitesorthy, Joanna Primary Care Physician: Loney LaurenceAMPBELL, MARGARET Other Clinician: Referring Physician: Loney LaurenceAMPBELL, MARGARET Treating Physician/Extender: Elayne SnarePARKER, Bee Marchiano Weeks in Treatment: 22 Information Obtained from: Patient Chief Complaint patient is back to see us today for diabetic foot ulcer which she's had for several months. Electronic Signature(s) Signed: 08/01/2015 2:36:28 PM By: Ardath SaxParker, Tirso Laws MD Entered By: Ardath SaxParker, Julyanna Scholle on 08/01/2015 14:36:27 Jann, [name] ([id number]) -------------------------------------------------------------------------------- Debridement Details Patient Name: [name], Jordi Date of Service: 08/01/2015 1:30 PM Medical Record Number: [id number] Patient Account Number: [id number] Date of Birth/Sex: 1927-07-26 (82 y.o. Female) Treating RN: Curtis Sitesorthy, Joanna Primary Care Physician: Loney LaurenceAMPBELL, MARGARET Other Clinician: Referring Physician: Loney LaurenceAMPBELL, MARGARET Treating Physician/Extender: Elayne SnarePARKER, Ravinder Hofland Weeks in Treatment: 22 Debridement Performed for Wound #1 Left Calcaneous Assessment: Performed By: Physician Ardath SaxPARKER, Marco Raper, MD Debridement: Debridement Pre-procedure Yes Verification/Time Out Taken: Start Time: 13:54 Pain Control: Lidocaine 4% Topical Solution Level: Skin/Subcutaneous Tissue Total Area Debrided (L x 1.5 (cm) x 1.8 (cm) = 2.7 (cm) W): Tissue and other Viable, Non-Viable, Fibrin/Slough, Subcutaneous material debrided: Instrument: Curette Bleeding: Minimum Hemostasis Achieved: Pressure End Time: 13:55 Procedural Pain: 0 Post Procedural Pain: 0 Response to Treatment: Procedure was tolerated well Post Debridement Measurements of Total Wound Length: (cm)  1.5 Stage: Unstageable/Unclassified Width: (cm) 1.8 Depth: (cm) 0.3 Volume: (cm) 0.636 Post Procedure Diagnosis Same as Pre-procedure Electronic Signature(s) Signed: 08/01/2015 4:16:05 PM By: Ardath SaxParker, Jayd Cadieux MD Signed: 08/01/2015 4:50:16 PM By: Curtis Sitesorthy, Joanna Entered By: Curtis Sitesorthy, Joanna on 08/01/2015 13:56:14 Kantner, Becki ([id number]) -------------------------------------------------------------------------------- HPI Details Patient Name: [name], Shanece Date of Service: 08/01/2015 1:30 PM Medical Record Number: [id number] Patient Account Number: [id number] Date of Birth/Sex: 1927-07-26 (82 y.o. Female) Treating RN: Curtis Sitesorthy, Joanna Primary Care Physician: Loney LaurenceAMPBELL, MARGARET Other Clinician: Referring Physician: Loney LaurenceAMPBELL, MARGARET Treating Physician/Extender: Elayne SnarePARKER, Aitanna Haubner Weeks in Treatment: 22 History of Present Illness Location: left heel and dorsum of the foot Quality: Patient reports No Pain. Severity: Patient states wound (s) are getting better. Duration: the patient has had surgery in early December and had vascular testing ordered but not done. Timing: Pain in wound is Intermittent (comes and goes Context: The wound appeared gradually over time Modifying Factors: Other treatment(s) tried include: local dressings and offloading. surgery done by Dr. Graciela HusbandsKlein for debridement of the left heel wound Associated Signs and Symptoms: Patient reports having difficulty standing for long periods. HPI Description: 82 year old patient with past medical history of Alzheimer's dementia, diabetes mellitus, hypertension and recent history of right hip arthroplasty in August 2016. Her past medical history is significant also for hypothyroidism, hypertension, urinary retention, dementia. 04/11/2015 -- she has had a lot of pain and left heel and there has been a lot of drainage today with purulent material. He has not been running fever. 04/18/2015 -- last week she was here with a florid  infection of her left heel and I had sent her to the hospital for admission. She was admitted between 12/ 2 and 12/ 5 and she underwent a debridement of the left heel abscess with placement of antibiotic beads with vancomycin, by Dr. Linus Galasodd Cline. Her cultures grew gram- negative bacteria, was given vancomycin and Unasyn IV in hospital and she was continued on oral antibiotics and was given documented for 10 days. at the time of discharge Dr. Alberteen Spindleline wanted the dressing to be intact for about a week before it  would need changing. 05/29/2014 -- she has not been to see [place] for the last 6 weeks and from what I understand she has been in rehabilitation facility. No vascular testing was done as we had requested. She has a diabetic foot ulcer which was graded as a Wagner 3 when she was seen last about a month and a half ago. There is no family member to discuss hyperbaric oxygen therapy and the patient has as Alzheimer's dementia and I do not believe she understands our discussions 06/12/2015 -- a vascular appointment is pending for tomorrow. 06/19/2015 -- the vascular workup revealed that the left lower extremity had a 50-99% stenosis in the left superficial femoral, popliteal and tibioperoneal trunk. 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 Dr. Wyn Quaker has recommended angiographically and possible stent placement for this critical ischemia. 07/03/2015 -- he is going to have a stent placement done by Dr. Wyn Quaker this coming Monday. 07/11/2015 -- she was taken for an aortogram and found to have diffuse disease SFA with a high-grade near occlusive stenosis in the distal SFA just above Hunter's canal. One-vessel runoff being the wound area and to get tibial artery at the proximal anterior tibial artery had a short segment occlusion in the distal anterior tibial artery and a moderate to high-grade disease of about 10-12 cm. she had a percutaneous angioplasty of the distal and middle left  anterior tibial artery, proximal left anterior tibial artery and distal SFA and proximal popliteal artery. SUMMERLYN, FICKEL ([id number]) Electronic Signature(s) Signed: 08/01/2015 2:36:43 PM By: Ardath Sax MD Entered By: Ardath Sax on 08/01/2015 14:36:43 RUBA, OUTEN ([id number]) -------------------------------------------------------------------------------- Physical Exam Details Patient Name: Gloria Fate Date of Service: 08/01/2015 1:30 PM Medical Record Number: [id number] Patient Account Number: [id number] Date of Birth/Sex: 10-09-1927 (82 y.o. Female) Treating RN: Curtis Sites Primary Care Physician: Loney Laurence Other Clinician: Referring Physician: Loney Laurence Treating Physician/Extender: Elayne Snare in Treatment: 22 Electronic Signature(s) Signed: 08/01/2015 2:36:51 PM By: Ardath Sax MD Entered By: Ardath Sax on 08/01/2015 14:36:51 Begeman, [name] ([id number]) -------------------------------------------------------------------------------- Physician Orders Details Patient Name: Gloria Fate Date of Service: 08/01/2015 1:30 PM Medical Record Number: [id number] Patient Account Number: [id number] Date of Birth/Sex: June 19, 1927 (82 y.o. Female) Treating RN: Curtis Sites Primary Care Physician: Loney Laurence Other Clinician: Referring Physician: Loney Laurence Treating Physician/Extender: Elayne Snare in Treatment: 79 Verbal / Phone Orders: Yes Clinician: Curtis Sites Read Back and Verified: Yes Diagnosis Coding Wound Cleansing Wound #1 Left Calcaneous o Clean wound with Normal Saline. o May Shower, gently pat wound dry prior to applying new dressing. Anesthetic Wound #1 Left Calcaneous o Topical Lidocaine 4% cream applied to wound bed prior to debridement Skin Barriers/Peri-Wound Care Wound #1 Left Calcaneous o Skin Prep Primary Wound Dressing Wound #1 Left Calcaneous o Santyl  Ointment Secondary Dressing Wound #1 Left Calcaneous o Dry Gauze o Boardered Foam Dressing Dressing Change Frequency Wound #1 Left Calcaneous o Change dressing every other day. - for home health purposes Follow-up Appointments Wound #1 Left Calcaneous o Return Appointment in 1 week. Off-Loading Wound #1 Left Calcaneous o Heel suspension boot to: - sage boots PEG ASSIST SHOE o Turn and reposition every 2 hours Duff, [place] ([id number]) o Other: - FLOAT HEELS WHEN LYING IN BED Home Health Wound #1 Left Calcaneous o Continue Home Health Visits - [facility] *****PLEASE CHANGE YOUR DAY TO MONDAYS******* PT IS BEING SEEN IN THE CLINIC ON THURSDAYS NOW******* o Home Health Nurse may visit PRN to address patientos wound care needs.   o FACE TO FACE ENCOUNTER: MEDICARE and MEDICAID PATIENTS: I certify that this patient is under my care and that I had a face-to-face encounter that meets the physician face-to-face encounter requirements with this patient on this date. The encounter with the patient was in whole or in part for the following MEDICAL CONDITION: (primary reason for Home Healthcare) MEDICAL NECESSITY: I certify, that based on my findings, NURSING services are a medically necessary home health service. HOME BOUND STATUS: I certify that my clinical findings support that this patient is homebound (i.e., Due to illness or injury, pt requires aid of supportive devices such as crutches, cane, wheelchairs, walkers, the use of special transportation or the assistance of another person to leave their place of residence. There is a normal inability to leave the home and doing so requires considerable and taxing effort. Other absences are for medical reasons / religious services and are infrequent or of short duration when for other reasons). o If current dressing causes regression in wound condition, may D/C ordered dressing product/s and apply Normal Saline Moist  Dressing daily until next Wound Healing Center / Other MD appointment. Notify Wound Healing Center of regression in wound condition at [phone]. o Please direct any NON-WOUND related issues/requests for orders to patient's Primary Care Physician Electronic Signature(s) Signed: 08/01/2015 4:16:05 PM By: Ardath Sax MD Signed: 08/01/2015 4:50:16 PM By: Curtis Sites Entered By: Curtis Sites on 08/01/2015 13:57:00 Outten, [name] ([id number]) -------------------------------------------------------------------------------- Problem List Details Patient Name: Gloria Fate Date of Service: 08/01/2015 1:30 PM Medical Record Number: [id number] Patient Account Number: [id number] Date of Birth/Sex: 1928-04-02 (82 y.o. 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 Inactive Problems Resolved Problems ICD-10 Code Description Active Date Resolved Date L89.521 Pressure ulcer of left ankle, stage 1 02/27/2015 02/27/2015 L03.116 Cellulitis of left lower limb 04/11/2015 04/11/2015 Electronic Signature(s) Signed: 08/01/2015 2:36:18 PM By: Ardath Sax MD Entered By: Ardath Sax on 08/01/2015 14:36:18 Kulik, Jameshia ([id number]) -------------------------------------------------------------------------------- Progress Note Details Patient Name: Gloria Fate Date of Service: 08/01/2015 1:30 PM Medical Record Number: [id number] Patient Account Number: [id number] Date of Birth/Sex:  11/14/1927 (82 y.o. Female) Treating RN: Curtis Sites Primary Care Physician: Loney Laurence Other Clinician: Referring Physician: Loney Laurence Treating Physician/Extender: Elayne Snare in Treatment: 22 Subjective Chief Complaint Information obtained from Patient patient is back to see [place] today for diabetic foot ulcer which she's had for several months. History of Present Illness (HPI) The following HPI elements were documented for the patient's wound: Location: left heel and dorsum of the foot Quality: Patient reports No Pain. Severity: Patient states wound (s) are getting better. Duration: the patient has had surgery in early December and had vascular testing ordered but not done. Timing: Pain in wound is Intermittent (comes and goes Context: The wound appeared gradually over time Modifying Factors: Other treatment(s) tried include: local dressings and offloading. surgery done by Dr. Graciela Husbands for debridement of the left heel wound Associated Signs and Symptoms: Patient reports having difficulty standing for long periods. 82 year old patient with past medical history of Alzheimer's dementia, diabetes mellitus, hypertension and recent history of right hip arthroplasty in August 2016. Her  past medical history is significant also for hypothyroidism, hypertension, urinary retention, dementia. 04/11/2015 -- she has had a lot of pain and left heel and there has been a lot of drainage today with purulent material. He has not been running fever. 04/18/2015 -- last week she was here with a florid infection of her left heel and I had sent her to the hospital for admission. She was admitted between 12/ 2 and 12/ 5 and she underwent a debridement of the left heel abscess with placement of antibiotic beads with vancomycin, by Dr. Linus Galas. Her cultures grew gram- negative bacteria, was given vancomycin and Unasyn IV in hospital and she was continued on oral antibiotics and was  given documented for 10 days. at the time of discharge Dr. Alberteen Spindle wanted the dressing to be intact for about a week before it would need changing. 05/29/2014 -- she has not been to see [place] for the last 6 weeks and from what I understand she has been in rehabilitation facility. No vascular testing was done as we had requested. She has a diabetic foot ulcer which was graded as a Wagner 3 when she was seen last about a month and a half ago. There is no family member to discuss hyperbaric oxygen therapy and the patient has as Alzheimer's dementia and I do not believe she understands our discussions 06/12/2015 -- a vascular appointment is pending for tomorrow. 06/19/2015 -- the vascular workup revealed that the left lower extremity had a 50-99% stenosis in the left superficial femoral, popliteal and tibioperoneal trunk. 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 Dr. Wyn Quaker has recommended angiographically and possible stent placement for this critical ischemia. JACIE, TRISTAN ([id number]) 07/03/2015 -- he is going to have a stent placement done by Dr. Wyn Quaker this coming Monday. 07/11/2015 -- she was taken for an aortogram and found to have diffuse disease SFA with a high-grade near occlusive stenosis in the distal SFA just above Hunter's canal. One-vessel runoff being the wound area and to get tibial artery at the proximal anterior tibial artery had a short segment occlusion in the distal anterior tibial artery and a moderate to high-grade disease of about 10-12 cm. she had a percutaneous angioplasty of the distal and middle left anterior tibial artery, proximal left anterior tibial artery and distal SFA and proximal popliteal artery. Objective Constitutional Vitals Time Taken: 1:45 PM, Height: 64 in, Temperature: 98.1 F, Pulse: 79 bpm, Respiratory Rate: 16 breaths/min, Blood Pressure: 120/61 mmHg. Integumentary (Hair, Skin) Wound #1 status is Open. Original cause of wound was  Pressure Injury. The wound is located on the Left Calcaneous. The wound measures 1.5cm length x 1.8cm width x 0.2cm depth; 2.121cm^2 area and 0.424cm^3 volume. The wound is limited to skin breakdown. There is no tunneling or undermining noted. There is a large amount of serous drainage noted. The wound margin is indistinct and nonvisible. There is large (67-100%) red, pink granulation within the wound bed. There is a small (1-33%) amount of necrotic tissue within the wound bed including Adherent Slough. The periwound skin appearance exhibited: Maceration, Moist, Erythema. The periwound skin appearance did not exhibit: Callus, Crepitus, Excoriation, Fluctuance, Friable, Induration, Localized Edema, Rash, Scarring, Dry/Scaly, Atrophie Blanche, Cyanosis, Ecchymosis, Hemosiderin Staining, Mottled, Pallor, Rubor. The surrounding wound skin color is noted with erythema which is circumferential. Periwound temperature was noted as No Abnormality. The periwound has tenderness on palpation. Assessment Active Problems ICD-10 E11.621 - Type 2 diabetes mellitus with foot ulcer L89.620 - Pressure  ulcer of left heel, unstageable G30.9 - Alzheimer's disease, unspecified L97.422 - Non-pressure chronic ulcer of left heel and midfoot with fat layer exposed Jaros, Docia ([id number]) Procedures Wound #1 Wound #1 is a Pressure Ulcer located on the Left Calcaneous . There was a Skin/Subcutaneous Tissue Debridement (40981-19147) debridement with total area of 2.7 sq cm performed by Ardath Sax, MD. with the following instrument(s): Curette to remove Viable and Non-Viable tissue/material including Fibrin/Slough and Subcutaneous after achieving pain control using Lidocaine 4% Topical Solution. A time out was conducted prior to the start of the procedure. A Minimum amount of bleeding was controlled with Pressure. The procedure was tolerated well with a pain level of 0 throughout and a pain level of 0  following the procedure. Post Debridement Measurements: 1.5cm length x 1.8cm width x 0.3cm depth; 0.636cm^3 volume. Post debridement Stage noted as Unstageable/Unclassified. Post procedure Diagnosis Wound #1: Same as Pre-Procedure Plan Wound Cleansing: Wound #1 Left Calcaneous: Clean wound with Normal Saline. May Shower, gently pat wound dry prior to applying new dressing. Anesthetic: Wound #1 Left Calcaneous: Topical Lidocaine 4% cream applied to wound bed prior to debridement Skin Barriers/Peri-Wound Care: Wound #1 Left Calcaneous: Skin Prep Primary Wound Dressing: Wound #1 Left Calcaneous: Santyl Ointment Secondary Dressing: Wound #1 Left Calcaneous: Dry Gauze Boardered Foam Dressing Dressing Change Frequency: Wound #1 Left Calcaneous: Change dressing every other day. - for home health purposes Follow-up Appointments: Wound #1 Left Calcaneous: Return Appointment in 1 week. Off-Loading: Wound #1 Left Calcaneous: Heel suspension boot to: - sage boots PEG ASSIST SHOE Turn and reposition every 2 hours Welch, Rasheka ([id number]) Other: - FLOAT HEELS WHEN LYING IN BED Home Health: Wound #1 Left Calcaneous: Continue Home Health Visits - [facility] *****PLEASE CHANGE YOUR DAY TO MONDAYS******* PT IS BEING SEEN IN THE CLINIC ON THURSDAYS NOW******* Home Health Nurse may visit PRN to address patient s wound care needs. FACE TO FACE ENCOUNTER: MEDICARE and MEDICAID PATIENTS: I certify that this patient is under my care and that I had a face-to-face encounter that meets the physician face-to-face encounter requirements with this patient on this date. The encounter with the patient was in whole or in part for the following MEDICAL CONDITION: (primary reason for Home Healthcare) MEDICAL NECESSITY: I certify, that based on my findings, NURSING services are a medically necessary home health service. HOME BOUND STATUS: I certify that my clinical findings support that this patient is  homebound (i.e., Due to illness or injury, pt requires aid of supportive devices such as crutches, cane, wheelchairs, walkers, the use of special transportation or the assistance of another person to leave their place of residence. There is a normal inability to leave the home and doing so requires considerable and taxing effort. Other absences are for medical reasons / religious services and are infrequent or of short duration when for other reasons). If current dressing causes regression in wound condition, may D/C ordered dressing product/s and apply Normal Saline Moist Dressing daily until next Wound Healing Center / Other MD appointment. Notify Wound Healing Center of regression in wound condition at [phone]. Please direct any NON-WOUND related issues/requests for orders to patient's Primary Care Physician Follow-Up Appointments: A follow-up appointment should be scheduled. A Patient Clinical Summary of Care was provided to [facility] Stage 3 pressure ulcer left heel. Debrided necrotic fat and dressedwith Santyl which will be done 3 x a week. Electronic Signature(s) Signed: 08/01/2015 2:44:50 PM By: Ardath Sax MD Entered By: Ardath Sax on 08/01/2015 14:44:50 Feagans, Markell ([id number]) --------------------------------------------------------------------------------  SuperBill Details Patient Name: KIP, KAUTZMAN Date of Service: 08/01/2015 Medical Record Number: [id number] Patient Account Number: [id number] Date of Birth/Sex: 01/16/1928 (82 y.o. Female) Treating RN: Curtis Sites Primary Care Physician: Loney Laurence Other Clinician: Referring Physician: Loney Laurence Treating Physician/Extender: Elayne Snare in Treatment: 22 Diagnosis Coding ICD-10 Codes Code Description E11.621 Type 2 diabetes mellitus with foot ulcer L89.620 Pressure ulcer of left heel, unstageable G30.9 Alzheimer's disease, unspecified L97.422 Non-pressure chronic ulcer of left heel  and midfoot with fat layer exposed Facility Procedures CPT4 Code: [id number] Description: 11042 - DEB SUBQ TISSUE 20 SQ CM/< ICD-10 Description Diagnosis L89.620 Pressure ulcer of left heel, unstageable Modifier: Quantity: 1 Physician Procedures CPT4 Code: [id number] Description: 11042 - WC PHYS SUBQ TISS 20 SQ CM ICD-10 Description Diagnosis L89.620 Pressure ulcer of left heel, unstageable Modifier: Quantity: 1 Electronic Signature(s) Signed: 08/01/2015 2:45:23 PM By: Ardath Sax MD Entered By: Ardath Sax on 08/01/2015 14:45:23

## 2015-08-03 NOTE — Progress Notes (Signed)
[name], Brystal ([id number]) Visit Report for 08/01/2015 Arrival Information Details Patient Name: [name], [name] Date of Service: 08/01/2015 1:30 PM Medical Record Number: [id number] Patient Account Number: [id number] Date of Birth/Sex: [birth year] (82 y.o. Female) Treating RN: Curtis Sitesorthy, Joanna Primary Care Physician: Loney LaurenceAMPBELL, MARGARET Other Clinician: Referring Physician: Loney LaurenceAMPBELL, MARGARET Treating Physician/Extender: Elayne SnarePARKER, PETER Weeks in Treatment: 22 Visit Information History Since Last Visit Added or deleted any medications: No Patient Arrived: Wheel Chair Any new allergies or adverse reactions: No Arrival Time: 13:38 Had a fall or experienced change in No Accompanied By: staff activities of daily living that may affect Transfer Assistance: Manual risk of falls: Patient Identification Verified: Yes Signs or symptoms of abuse/neglect since last No Secondary Verification Process Yes visito Completed: Hospitalized since last visit: No Patient Requires Transmission- No Pain Present Now: No Based Precautions: Patient Has Alerts: Yes Patient Alerts: Patient on Blood Thinner ABI: Non Compressible Electronic Signature(s) Signed: 08/01/2015 4:50:16 PM By: Curtis Sitesorthy, Joanna Entered By: Curtis Sitesorthy, Joanna on 08/01/2015 13:40:32 Cuthrell, [name] ([id number]) -------------------------------------------------------------------------------- Encounter Discharge Information Details Patient Name: [name], [name] Date of Service: 08/01/2015 1:30 PM Medical Record Number: [id number] Patient Account Number: [id number] Date of Birth/Sex: [birth year] (82 y.o. Female) Treating RN: Curtis Sitesorthy, Joanna Primary Care Physician: Loney LaurenceAMPBELL, MARGARET Other Clinician: Referring Physician: Loney LaurenceAMPBELL, MARGARET Treating Physician/Extender: Elayne SnarePARKER, PETER Weeks in Treatment: 22 Encounter Discharge Information Items Discharge Pain Level: 0 Discharge Condition: Stable Ambulatory Status: Wheelchair Discharge  Destination: Home Transportation: Private Auto Accompanied By: staff Schedule Follow-up Appointment: Yes Medication Reconciliation completed and provided to Patient/Care No Danaka Llera: Provided on Clinical Summary of Care: 08/01/2015 Form Type Recipient Paper Patient BM Electronic Signature(s) Signed: 08/01/2015 2:46:02 PM By: Ardath SaxParker, Peter MD Previous Signature: 08/01/2015 2:16:54 PM Version By: Curtis Sitesorthy, Joanna Previous Signature: 08/01/2015 2:08:21 PM Version By: Gwenlyn PerkingMoore, Shelia Entered By: Ardath SaxParker, Peter on 08/01/2015 14:46:02 Glasper, Alysia ([id number]) -------------------------------------------------------------------------------- Lower Extremity Assessment Details Patient Name: [name], [name] Date of Service: 08/01/2015 1:30 PM Medical Record Number: [id number] Patient Account Number: [id number] Date of Birth/Sex: [birth year] (82 y.o. Female) Treating RN: Curtis Sitesorthy, Joanna Primary Care Physician: Loney LaurenceAMPBELL, MARGARET Other Clinician: Referring Physician: Loney LaurenceAMPBELL, MARGARET Treating Physician/Extender: Elayne SnarePARKER, PETER Weeks in Treatment: 22 Vascular Assessment Pulses: Posterior Tibial Dorsalis Pedis Palpable: [Left:Yes] Extremity colors, hair growth, and conditions: Extremity Color: [Left:Normal] Hair Growth on Extremity: [Left:No] Temperature of Extremity: [Left:Warm] Capillary Refill: [Left:< 3 seconds] Electronic Signature(s) Signed: 08/01/2015 4:50:16 PM By: Curtis Sitesorthy, Joanna Entered By: Curtis Sitesorthy, Joanna on 08/01/2015 13:46:19 Hallenbeck, Ian ([id number]) -------------------------------------------------------------------------------- Multi Wound Chart Details Patient Name: [name], [name] Date of Service: 08/01/2015 1:30 PM Medical Record Number: [id number] Patient Account Number: [id number] Date of Birth/Sex: [birth year] (82 y.o. Female) Treating RN: Curtis Sitesorthy, Joanna Primary Care Physician: Loney LaurenceAMPBELL, MARGARET Other Clinician: Referring Physician: Loney LaurenceAMPBELL, MARGARET Treating  Physician/Extender: Elayne SnarePARKER, PETER Weeks in Treatment: 22 Vital Signs Height(in): 64 Pulse(bpm): 79 Weight(lbs): Blood Pressure 120/61 (mmHg): Body Mass Index(BMI): Temperature(F): 98.1 Respiratory Rate 16 (breaths/min): Photos: [1:No Photos] [N/A:N/A] Wound Location: [1:Left Calcaneous] [N/A:N/A] Wounding Event: [1:Pressure Injury] [N/A:N/A] Primary Etiology: [1:Pressure Ulcer] [N/A:N/A] Comorbid History: [1:Anemia, Hypertension, Type II Diabetes, History of pressure wounds, Osteoarthritis, Dementia] [N/A:N/A] Date Acquired: [1:01/30/2015] [N/A:N/A] Weeks of Treatment: [1:22] [N/A:N/A] Wound Status: [1:Open] [N/A:N/A] Measurements L x W x D 1.5x1.8x0.2 [N/A:N/A] (cm) Area (cm) : [1:2.121] [N/A:N/A] Volume (cm) : [1:0.424] [N/A:N/A] % Reduction in Area: [1:78.00%] [N/A:N/A] % Reduction in Volume: 55.90% [N/A:N/A] Classification: [1:Unstageable/Unclassified] [N/A:N/A] HBO Classification: [1:Grade 1] [N/A:N/A] Exudate Amount: [1:Large] [N/A:N/A] Exudate Type: [1:Serous] [N/A:N/A] Exudate Color: [1:amber] [N/A:N/A] Wound Margin: [1:Indistinct, nonvisible] [N/A:N/A] Granulation Amount: [1:Large (67-100%)] [N/A:N/A] Granulation  Quality: [1:Red, Pink] [N/A:N/A] Necrotic Amount: [1:Small (1-33%)] [N/A:N/A] Exposed Structures: [1:Fascia: No Fat: No Tendon: No Muscle: No] [N/A:N/A] Joint: No Bone: No Limited to Skin Breakdown Epithelialization: None N/A N/A Periwound Skin Texture: Edema: No N/A N/A Excoriation: No Induration: No Callus: No Crepitus: No Fluctuance: No Friable: No Rash: No Scarring: No Periwound Skin Maceration: Yes N/A N/A Moisture: Moist: Yes Dry/Scaly: No Periwound Skin Color: Erythema: Yes N/A N/A Atrophie Blanche: No Cyanosis: No Ecchymosis: No Hemosiderin Staining: No Mottled: No Pallor: No Rubor: No Erythema Location: Circumferential N/A N/A Temperature: No Abnormality N/A N/A Tenderness on Yes N/A N/A Palpation: Wound  Preparation: Ulcer Cleansing: N/A N/A Rinsed/Irrigated with Saline Topical Anesthetic Applied: Other: lidocaine 4% Treatment Notes Electronic Signature(s) Signed: 08/01/2015 4:50:16 PM By: Curtis Sites Entered By: Curtis Sites on 08/01/2015 13:48:57 Agro, Raphaella ([id number]) -------------------------------------------------------------------------------- Multi-Disciplinary Care Plan Details Patient Name: [name] Date of Service: 08/01/2015 1:30 PM Medical Record Number: [id number] Patient Account Number: [id number] Date of Birth/Sex: February 16, 1928 (82 y.o. Female) Treating RN: Curtis Sites Primary Care Physician: Loney Laurence Other Clinician: Referring Physician: Loney Laurence Treating Physician/Extender: Elayne Snare in Treatment: 22 Active Inactive Orientation to the Wound Care Program Nursing Diagnoses: Knowledge deficit related to the wound healing center program Goals: Patient/caregiver will verbalize understanding of the Wound Healing Center Program Date Initiated: 02/27/2015 Goal Status: Active Interventions: Provide education on orientation to the wound center Notes: Pressure Nursing Diagnoses: Knowledge deficit related to management of pressures ulcers Potential for impaired tissue integrity related to pressure, friction, moisture, and shear Goals: Patient will remain free from development of additional pressure ulcers Date Initiated: 02/27/2015 Goal Status: Active Patient will remain free of pressure ulcers Date Initiated: 02/27/2015 Goal Status: Active Patient/caregiver will verbalize risk factors for pressure ulcer development Date Initiated: 02/27/2015 Goal Status: Active Patient/caregiver will verbalize understanding of pressure ulcer management Date Initiated: 02/27/2015 Goal Status: Active Interventions: Assess: immobility, friction, shearing, incontinence upon admission and as needed Clavin, Ellsie  ([id number]) Assess offloading mechanisms upon admission and as needed Assess potential for pressure ulcer upon admission and as needed Provide education on pressure ulcers Treatment Activities: Patient referred for home evaluation of offloading devices/mattresses : 08/01/2015 Patient referred for pressure reduction/relief devices : 08/01/2015 Notes: Wound/Skin Impairment Nursing Diagnoses: Impaired tissue integrity Knowledge deficit related to ulceration/compromised skin integrity Goals: Patient/caregiver will verbalize understanding of skin care regimen Date Initiated: 02/27/2015 Goal Status: Active Ulcer/skin breakdown will have a volume reduction of 30% by week 4 Date Initiated: 02/27/2015 Goal Status: Active Ulcer/skin breakdown will have a volume reduction of 50% by week 8 Date Initiated: 02/27/2015 Goal Status: Active Ulcer/skin breakdown will have a volume reduction of 80% by week 12 Date Initiated: 02/27/2015 Goal Status: Active Ulcer/skin breakdown will heal within 14 weeks Date Initiated: 02/27/2015 Goal Status: Active Interventions: Assess patient/caregiver ability to obtain necessary supplies Assess patient/caregiver ability to perform ulcer/skin care regimen upon admission and as needed Assess ulceration(s) every visit Provide education on ulcer and skin care Treatment Activities: Skin care regimen initiated : 08/01/2015 Notes: Electronic Signature(s) RICARDO, KAYES ([id number]) Signed: 08/01/2015 4:50:16 PM By: Curtis Sites Entered By: Curtis Sites on 08/01/2015 13:48:49 Arrona, [name] ([id number]) -------------------------------------------------------------------------------- Patient/Caregiver Education Details Patient Name: [name] Date of Service: 08/01/2015 1:30 PM Medical Record Number: [id number] Patient Account Number: [id number] Date of Birth/Gender: [birth year] (82 y.o. Female) Treating RN: Curtis Sites Primary Care Physician:  Loney Laurence Other Clinician: Referring Physician: Loney Laurence Treating Physician/Extender: Elayne Snare in Treatment: 22 Education Assessment Education Provided To: Patient  Education Topics Provided Nutrition: Handouts: Other: nutrition for wound healing Methods: Explain/Verbal Responses: State content correctly Electronic Signature(s) Signed: 08/01/2015 2:46:15 PM By: Ardath Sax MD Previous Signature: 08/01/2015 2:17:12 PM Version By: Curtis Sites Entered By: Ardath Sax on 08/01/2015 14:46:15 Busker, Stephinie ([id number]) -------------------------------------------------------------------------------- Wound Assessment Details Patient Name: [name] Date of Service: 08/01/2015 1:30 PM Medical Record Number: [id number] Patient Account Number: [id number] Date of Birth/Sex: [birth year] (82 y.o. Female) Treating RN: Curtis Sites Primary Care Physician: Loney Laurence Other Clinician: Referring Physician: Loney Laurence Treating Physician/Extender: Elayne Snare in Treatment: 22 Wound Status Wound Number: 1 Primary Pressure Ulcer Etiology: Wound Location: Left Calcaneous Wound Open Wounding Event: Pressure Injury Status: Date Acquired: 01/30/2015 Comorbid Anemia, Hypertension, Type II Weeks Of Treatment: 22 History: Diabetes, History of pressure wounds, Clustered Wound: No Osteoarthritis, Dementia Photos Wound Measurements Length: (cm) 1.5 Width: (cm) 1.8 Depth: (cm) 0.2 Area: (cm) 2.121 Volume: (cm) 0.424 % Reduction in Area: 78% % Reduction in Volume: 55.9% Epithelialization: None Tunneling: No Undermining: No Wound Description Classification: Unstageable/Unclassified Foul Od Diabetic Severity (Wagner): Grade 1 Wound Margin: Indistinct, nonvisible Exudate Amount: Large Exudate Type: Serous Exudate Color: amber or After Cleansing: No Wound Bed Granulation Amount: Large (67-100%) Exposed  Structure Granulation Quality: Red, Pink Fascia Exposed: No Necrotic Amount: Small (1-33%) Fat Layer Exposed: No Necrotic Quality: Adherent Slough Tendon Exposed: No Adrian, Brittany ([id number]) Muscle Exposed: No Joint Exposed: No Bone Exposed: No Limited to Skin Breakdown Periwound Skin Texture Texture Color No Abnormalities Noted: No No Abnormalities Noted: No Callus: No Atrophie Blanche: No Crepitus: No Cyanosis: No Excoriation: No Ecchymosis: No Fluctuance: No Erythema: Yes Friable: No Erythema Location: Circumferential Induration: No Hemosiderin Staining: No Localized Edema: No Mottled: No Rash: No Pallor: No Scarring: No Rubor: No Moisture Temperature / Pain No Abnormalities Noted: No Temperature: No Abnormality Dry / Scaly: No Tenderness on Palpation: Yes Maceration: Yes Moist: Yes Wound Preparation Ulcer Cleansing: Rinsed/Irrigated with Saline Topical Anesthetic Applied: Other: lidocaine 4%, Treatment Notes Wound #1 (Left Calcaneous) 1. Cleansed with: Clean wound with Normal Saline 2. Anesthetic Topical Lidocaine 4% cream to wound bed prior to debridement 4. Dressing Applied: Santyl Ointment 5. Secondary Dressing Applied Bordered Foam Dressing Dry Gauze Electronic Signature(s) Signed: 08/01/2015 4:48:49 PM By: Curtis Sites Entered By: Curtis Sites on 08/01/2015 16:48:49 Kendrick, [name] ([id number]) -------------------------------------------------------------------------------- Vitals Details Patient Name: [name] Date of Service: 08/01/2015 1:30 PM Medical Record Number: [id number] Patient Account Number: [id number] Date of Birth/Sex: August 02, 1927 (82 y.o. Female) Treating RN: Curtis Sites Primary Care Physician: Loney Laurence Other Clinician: Referring Physician: Loney Laurence Treating Physician/Extender: Elayne Snare in Treatment: 22 Vital Signs Time Taken: 13:45 Temperature (F): 98.1 Height (in):  64 Pulse (bpm): 79 Respiratory Rate (breaths/min): 16 Blood Pressure (mmHg): 120/61 Reference Range: 80 - 120 mg / dl Electronic Signature(s) Signed: 08/01/2015 4:50:16 PM By: Curtis Sites Entered By: Curtis Sites on 08/01/2015 13:45:57

## 2015-08-08 ENCOUNTER — Encounter: Payer: Medicare Other | Admitting: Surgery

## 2015-08-08 DIAGNOSIS — E11621 Type 2 diabetes mellitus with foot ulcer: Secondary | ICD-10-CM | POA: Diagnosis not present

## 2015-08-13 NOTE — Progress Notes (Signed)
Gloria FateMORDECAI, Nycole ([id number]) Visit Report for 08/08/2015 Chief Complaint Document Details Patient Name: Gloria FateMORDECAI, [name] Date of Service: 08/08/2015 8:45 AM Medical Record Number: [id number] Patient Account Number: [id number] Date of Birth/Sex: May 31, 1927 (82 y.o. Female) Treating RN: Ashok CordiaPinkerton, Debi Primary Care Physician: Loney LaurenceAMPBELL, MARGARET Other Clinician: Referring Physician: Loney LaurenceAMPBELL, MARGARET Treating Physician/Extender: Rudene ReBritto, Rexanna Louthan Weeks in Treatment: 23 Information Obtained from: Patient Chief Complaint patient is back to see us today for diabetic foot ulcer which she's had for several months. Electronic Signature(s) Signed: 08/08/2015 9:07:41 AM By: Evlyn KannerBritto, Johnnetta Holstine MD, FACS Entered By: Evlyn KannerBritto, Kais Monje on 08/08/2015 09:07:41 Gloria FateMORDECAI, Anthonette ([id number]) -------------------------------------------------------------------------------- Debridement Details Patient Name: Gloria FateMORDECAI, [name] Date of Service: 08/08/2015 8:45 AM Medical Record Number: [id number] Patient Account Number: [id number] Date of Birth/Sex: May 31, 1927 (82 y.o. Female) Treating RN: Ashok CordiaPinkerton, Debi Primary Care Physician: Loney LaurenceAMPBELL, MARGARET Other Clinician: Referring Physician: Loney LaurenceAMPBELL, MARGARET Treating Physician/Extender: Rudene ReBritto, Rolla Kedzierski Weeks in Treatment: 23 Debridement Performed for Wound #1 Left Calcaneous Assessment: Performed By: Physician Evlyn KannerBritto, Macyn Remmert, MD Debridement: Debridement Pre-procedure Yes Verification/Time Out Taken: Start Time: 08:56 Pain Control: Other : lidocaine 4% cream Level: Skin/Subcutaneous Tissue Total Area Debrided (L x 1.3 (cm) x 1.5 (cm) = 1.95 (cm) W): Tissue and other Viable, Non-Viable, Exudate, Fibrin/Slough, Subcutaneous material debrided: Instrument: Curette Bleeding: Minimum Hemostasis Achieved: Pressure End Time: 09:02 Procedural Pain: 0 Post Procedural Pain: 0 Response to Treatment: Procedure was tolerated well Post Debridement Measurements of Total  Wound Length: (cm) 1.5 Stage: Unstageable/Unclassified Width: (cm) 1.5 Depth: (cm) 0.2 Volume: (cm) 0.353 Post Procedure Diagnosis Same as Pre-procedure Electronic Signature(s) Signed: 08/08/2015 4:02:38 PM By: Evlyn KannerBritto, Krithika Tome MD, FACS Signed: 08/12/2015 4:28:40 PM By: Alejandro MullingPinkerton, Debra Previous Signature: 08/08/2015 9:07:27 AM Version By: Evlyn KannerBritto, Salome Hautala MD, FACS Entered By: Alejandro MullingPinkerton, Debra on 08/08/2015 09:12:49 Custis, [name] ([id number]) -------------------------------------------------------------------------------- HPI Details Patient Name: Gloria FateMORDECAI, [name] Date of Service: 08/08/2015 8:45 AM Medical Record Number: [id number] Patient Account Number: [id number] Date of Birth/Sex: May 31, 1927 (82 y.o. Female) Treating RN: Ashok CordiaPinkerton, Debi Primary Care Physician: Loney LaurenceAMPBELL, MARGARET Other Clinician: Referring Physician: Loney LaurenceAMPBELL, MARGARET Treating Physician/Extender: Rudene ReBritto, Ellanora Rayborn Weeks in Treatment: 23 History of Present Illness Location: left heel and dorsum of the foot Quality: Patient reports No Pain. Severity: Patient states wound (s) are getting better. Duration: the patient has had surgery in early December and had vascular testing ordered but not done. Timing: Pain in wound is Intermittent (comes and goes Context: The wound appeared gradually over time Modifying Factors: Other treatment(s) tried include: local dressings and offloading. surgery done by Dr. Graciela HusbandsKlein for debridement of the left heel wound Associated Signs and Symptoms: Patient reports having difficulty standing for long periods. HPI Description: 82 year old patient with past medical history of Alzheimer's dementia, diabetes mellitus, hypertension and recent history of right hip arthroplasty in August 2016. Her past medical history is significant also for hypothyroidism, hypertension, urinary retention, dementia. 04/11/2015 -- she has had a lot of pain and left heel and there has been a lot of drainage today  with purulent material. He has not been running fever. 04/18/2015 -- last week she was here with a florid infection of her left heel and I had sent her to the hospital for admission. She was admitted between 12/ 2 and 12/ 5 and she underwent a debridement of the left heel abscess with placement of antibiotic beads with vancomycin, by Dr. Linus Galasodd Cline. Her cultures grew gram- negative bacteria, was given vancomycin and Unasyn IV in hospital and she was continued on oral antibiotics and was given documented for 10 days. at the time of  discharge Dr. Alberteen Spindle wanted the dressing to be intact for about a week before it would need changing. 05/29/2014 -- she has not been to see [place] for the last 6 weeks and from what I understand she has been in rehabilitation facility. No vascular testing was done as we had requested. She has a diabetic foot ulcer which was graded as a Wagner 3 when she was seen last about a month and a half ago. There is no family member to discuss hyperbaric oxygen therapy and the patient has as Alzheimer's dementia and I do not believe she understands our discussions 06/12/2015 -- a vascular appointment is pending for tomorrow. 06/19/2015 -- the vascular workup revealed that the left lower extremity had a 50-99% stenosis in the left superficial femoral, popliteal and tibioperoneal trunk. 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 Dr. Wyn Quaker has recommended angiographically and possible stent placement for this critical ischemia. 07/03/2015 -- he is going to have a stent placement done by Dr. Wyn Quaker this coming Monday. 07/11/2015 -- she was taken for an aortogram and found to have diffuse disease SFA with a high-grade near occlusive stenosis in the distal SFA just above Hunter's canal. One-vessel runoff being the wound area and to get tibial artery at the proximal anterior tibial artery had a short segment occlusion in the distal anterior tibial artery and a moderate to  high-grade disease of about 10-12 cm. she had a percutaneous angioplasty of the distal and middle left anterior tibial artery, proximal left anterior tibial artery and distal SFA and proximal popliteal artery. KAYLIANA, CODD ([id number]) Electronic Signature(s) Signed: 08/08/2015 9:07:48 AM By: Evlyn Kanner MD, FACS Entered By: Evlyn Kanner on 08/08/2015 09:07:48 ROSEY, EIDE ([id number]) -------------------------------------------------------------------------------- Physical Exam Details Patient Name: [name] Date of Service: 08/08/2015 8:45 AM Medical Record Number: [id number] Patient Account Number: [id number] Date of Birth/Sex: 04/12/1928 (82 y.o. Female) Treating RN: Ashok Cordia, Debi Primary Care Physician: Loney Laurence Other Clinician: Referring Physician: Loney Laurence Treating Physician/Extender: Rudene Re in Treatment: 23 Constitutional . Pulse regular. Respirations normal and unlabored. Afebrile. . Eyes Nonicteric. Reactive to light. Ears, Nose, Mouth, and Throat Lips, teeth, and gums WNL.Marland Kitchen Moist mucosa without lesions. Neck supple and nontender. No palpable supraclavicular or cervical adenopathy. Normal sized without goiter. Respiratory WNL. No retractions.. Breath sounds WNL, No rubs, rales, rhonchi, or wheeze.. Cardiovascular Heart rhythm and rate regular, no murmur or gallop.. Pedal Pulses WNL. No clubbing, cyanosis or edema. Lymphatic No adneopathy. No adenopathy. No adenopathy. Musculoskeletal Adexa without tenderness or enlargement.. Digits and nails w/o clubbing, cyanosis, infection, petechiae, ischemia, or inflammatory conditions.. Integumentary (Hair, Skin) No suspicious lesions. No crepitus or fluctuance. No peri-wound warmth or erythema. No masses.Marland Kitchen Psychiatric Judgement and insight Intact.. No evidence of depression, anxiety, or agitation.. Notes the wound on the dorsum of the left ankle is completely healed. The one  on the heel medially had a lot of maceration of the external skin and there was a lot of subcutaneous debris and once this was sharply debrided with a #3 curet the base of the ulcer is bleeding briskly and is healthy. Electronic Signature(s) Signed: 08/08/2015 9:09:00 AM By: Evlyn Kanner MD, FACS Entered By: Evlyn Kanner on 08/08/2015 09:09:00 [name] ([id number]) -------------------------------------------------------------------------------- Physician Orders Details Patient Name: [name] Date of Service: 08/08/2015 8:45 AM Medical Record Number: [id number] Patient Account Number: [id number] Date of Birth/Sex: 1928/03/26 (82 y.o. 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 o May Shower, gently pat wound dry prior to applying new dressing. Anesthetic Wound #1 Left Calcaneous o Topical Lidocaine 4% cream applied to wound bed prior to debridement Skin Barriers/Peri-Wound Care Wound #1 Left Calcaneous o Skin Prep Primary Wound Dressing Wound #1 Left Calcaneous o Aquacel Ag Secondary Dressing Wound #1 Left Calcaneous o Dry Gauze - to top of foot o Conform/Kerlix - tape o Boardered Foam Dressing Dressing Change Frequency Wound #1 Left Calcaneous o Change dressing every  other day. - for home health purposes KALLEE, NAM ([id number]) Follow-up Appointments Wound #1 Left Calcaneous o Return Appointment in 1 week. Off-Loading Wound #1 Left Calcaneous o Heel suspension boot to: - sage boots PEG ASSIST SHOE o Turn and reposition every 2 hours o Other: - FLOAT HEELS WHEN LYING IN BED Home Health Wound #1 Left Calcaneous o Continue Home Health Visits - [facility] *****PLEASE CHANGE YOUR DAY TO MONDAYS******* PT IS BEING SEEN IN THE CLINIC ON THURSDAYS NOW******* o Home Health Nurse may visit PRN to address patientos wound care needs. o FACE TO FACE ENCOUNTER: MEDICARE and MEDICAID PATIENTS: I certify that this patient is under my care and that I had a face-to-face encounter that meets the physician face-to-face encounter requirements with this patient on this date. The encounter with the patient was in whole or in part for the following MEDICAL CONDITION: (primary reason for Home Healthcare) MEDICAL NECESSITY: I certify, that based on my findings, NURSING services are a medically necessary home health service. HOME BOUND STATUS: I certify that my clinical findings support that this patient is homebound (i.e., Due to illness or injury, pt requires aid of supportive devices such as crutches, cane, wheelchairs, walkers, the use of special transportation or the assistance of another person to leave their place of residence. There is a normal inability to leave the home and doing so requires considerable and taxing effort. Other absences are for medical reasons / religious services and are infrequent or of short duration when for other reasons). o If current dressing causes regression in wound condition, may D/C ordered dressing product/s and apply Normal Saline Moist Dressing daily until next Wound Healing Center / Other MD appointment. Notify Wound Healing Center of regression in wound condition at [phone]. o Please direct any  NON-WOUND related issues/requests for orders to patient's Primary Care Physician Electronic Signature(s) Signed: 08/08/2015 4:02:38 PM By: Evlyn Kanner MD, FACS Signed: 08/12/2015 4:28:40 PM By: Alejandro Mulling Entered By: Alejandro Mulling on 08/08/2015 09:13:51 Cowens, Felicita ([id number]) -------------------------------------------------------------------------------- Problem List Details Patient Name: [name] Date of Service: 08/08/2015 8:45 AM Medical Record Number: [id number] Patient Account Number: [id number] Date of Birth/Sex: 12-Mar-1928 (82 y.o. Female) Treating RN: Ashok Cordia, Debi Primary Care Physician: Loney Laurence Other Clinician: Referring Physician: Loney Laurence Treating Physician/Extender: Rudene Re in Treatment: 23 Active Problems ICD-10 Encounter Code Description Active Date Diagnosis E11.621 Type 2 diabetes mellitus with foot ulcer 02/27/2015 Yes L89.620 Pressure ulcer of left heel, unstageable 02/27/2015 Yes G30.9 Alzheimer's disease, unspecified 02/27/2015 Yes L97.422 Non-pressure chronic  ulcer of left heel and midfoot with fat 05/30/2015 Yes 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 08/08/2015 Yes of other part of foot Inactive Problems Resolved Problems ICD-10 Code Description Active Date Resolved Date L89.521 Pressure ulcer of left ankle, stage 1 02/27/2015 02/27/2015 L03.116 Cellulitis of left lower limb 04/11/2015 04/11/2015 Electronic Signature(s) Signed: 08/08/2015 9:10:47 AM By: Evlyn Kanner MD, FACS Previous Signature: 08/08/2015 9:07:10 AM Version By: Evlyn Kanner MD, FACS [place], Latrisa ([id number]) Entered By: Evlyn Kanner on 08/08/2015 09:10:47 Thorner, [name] ([id number]) -------------------------------------------------------------------------------- Progress Note Details Patient Name: [name] Date of Service: 08/08/2015 8:45 AM Medical Record Number:  [id number] Patient Account Number: [id number] Date of Birth/Sex: 04/06/1928 (82 y.o. Female) Treating RN: Ashok Cordia, Debi Primary Care Physician: Loney Laurence Other Clinician: Referring Physician: Loney Laurence Treating Physician/Extender: Rudene Re in Treatment: 23 Subjective Chief Complaint Information obtained from Patient patient is back to see [place] today for diabetic foot ulcer which she's had for several months. History of Present Illness (HPI) The following HPI elements were documented for the patient's wound: Location: left heel and dorsum of the foot Quality: Patient reports No Pain. Severity: Patient states wound (s) are getting better. Duration: the patient has had surgery in early December and had vascular testing ordered but not done. Timing: Pain in wound is Intermittent (comes and goes Context: The wound appeared gradually over time Modifying Factors: Other treatment(s) tried include: local dressings and offloading. surgery done by Dr. Graciela Husbands for debridement of the left heel wound Associated Signs and Symptoms: Patient reports having difficulty standing for long periods. 82 year old patient with past medical history of Alzheimer's dementia, diabetes mellitus, hypertension and recent history of right hip arthroplasty in August 2016. Her past medical history is significant also for hypothyroidism, hypertension, urinary retention, dementia. 04/11/2015 -- she has had a lot of pain and left heel and there has been a lot of drainage today with purulent material. He has not been running fever. 04/18/2015 -- last week she was here with a florid infection of her left heel and I had sent her to the hospital for admission. She was admitted between 12/ 2 and 12/ 5 and she underwent a debridement of the left heel abscess with placement of antibiotic beads with vancomycin, by Dr. Linus Galas. Her cultures grew gram- negative bacteria, was given vancomycin and Unasyn IV  in hospital and she was continued on oral antibiotics and was given documented for 10 days. at the time of discharge Dr. Alberteen Spindle wanted the dressing to be intact for about a week before it would need changing. 05/29/2014 -- she has not been to see [place] for the last 6 weeks and from what I understand she has been in rehabilitation facility. No vascular testing was done as we had requested. She has a diabetic foot ulcer which was graded as a Wagner 3 when she was seen last about a month and a half ago. There is no family member to discuss hyperbaric oxygen therapy and the patient has as Alzheimer's dementia and I do not believe she understands our discussions 06/12/2015 -- a vascular appointment is pending for tomorrow. 06/19/2015 -- the vascular workup revealed that the left lower extremity had a 50-99% stenosis in the left superficial femoral, popliteal and tibioperoneal trunk. 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 Dr. Wyn Quaker has recommended angiographically and possible stent placement for this critical ischemia. CRICKET, GOODLIN ([id number]) 07/03/2015 -- he is going to have a stent placement done  by Dr. Wyn Quaker this coming Monday. 07/11/2015 -- she was taken for an aortogram and found to have diffuse disease SFA with a high-grade near occlusive stenosis in the distal SFA just above Hunter's canal. One-vessel runoff being the wound area and to get tibial artery at the proximal anterior tibial artery had a short segment occlusion in the distal anterior tibial artery and a moderate to high-grade disease of about 10-12 cm. she had a percutaneous angioplasty of the distal and middle left anterior tibial artery, proximal left anterior tibial artery and distal SFA and proximal popliteal artery. Objective Constitutional Pulse regular. Respirations normal and unlabored. Afebrile. Vitals Time Taken: 8:42 AM, Height: 64 in, Temperature: 98.2 F, Pulse: 81 bpm, Respiratory Rate:  16 breaths/min, Blood Pressure: 140/57 mmHg. Eyes Nonicteric. Reactive to light. Ears, Nose, Mouth, and Throat Lips, teeth, and gums WNL.Marland Kitchen Moist mucosa without lesions. Neck supple and nontender. No palpable supraclavicular or cervical adenopathy. Normal sized without goiter. Respiratory WNL. No retractions.. Breath sounds WNL, No rubs, rales, rhonchi, or wheeze.. Cardiovascular Heart rhythm and rate regular, no murmur or gallop.. Pedal Pulses WNL. No clubbing, cyanosis or edema. Lymphatic No adneopathy. No adenopathy. No adenopathy. Musculoskeletal Adexa without tenderness or enlargement.. Digits and nails w/o clubbing, cyanosis, infection, petechiae, ischemia, or inflammatory conditions.Marland Kitchen Psychiatric Judgement and insight Intact.. No evidence of depression, anxiety, or agitation.. General Notes: the wound on the dorsum of the left ankle is completely healed. The one on the heel Knodel, Karizma ([id number]) medially had a lot of maceration of the external skin and there was a lot of subcutaneous debris and once this was sharply debrided with a #3 curet the base of the ulcer is bleeding briskly and is healthy. Integumentary (Hair, Skin) No suspicious lesions. No crepitus or fluctuance. No peri-wound warmth or erythema. No masses.. Wound #1 status is Open. Original cause of wound was Pressure Injury. The wound is located on the Left Calcaneous. The wound measures 1.3cm length x 1.5cm width x 0.1cm depth; 1.532cm^2 area and 0.153cm^3 volume. The wound is limited to skin breakdown. There is no tunneling or undermining noted. There is a large amount of serous drainage noted. The wound margin is indistinct and nonvisible. There is small (1-33%) red, pink granulation within the wound bed. There is a large (67-100%) amount of necrotic tissue within the wound bed including Adherent Slough. The periwound skin appearance exhibited: Maceration, Moist, Erythema. The periwound skin appearance did  not exhibit: Callus, Crepitus, Excoriation, Fluctuance, Friable, Induration, Localized Edema, Rash, Scarring, Dry/Scaly, Atrophie Blanche, Cyanosis, Ecchymosis, Hemosiderin Staining, Mottled, Pallor, Rubor. The surrounding wound skin color is noted with erythema which is circumferential. Periwound temperature was noted as No Abnormality. The periwound has tenderness on palpation. Assessment Active Problems ICD-10 E11.621 - Type 2 diabetes mellitus with foot ulcer L89.620 - Pressure ulcer of left heel, unstageable G30.9 - Alzheimer's disease, unspecified L97.422 - Non-pressure chronic ulcer of left heel and midfoot with fat layer exposed I70.245 - Atherosclerosis of native arteries of left leg with ulceration of other part of foot Procedures Wound #1 Wound #1 is a Pressure Ulcer located on the Left Calcaneous . There was a Skin/Subcutaneous Tissue Debridement (40981-19147) debridement with total area of 1.95 sq cm performed by Evlyn Kanner, MD. with the following instrument(s): Curette to remove Viable and Non-Viable tissue/material including Exudate, Fibrin/Slough, and Subcutaneous after achieving pain control using Other (lidocaine 4% cream). A time out was conducted prior to the start of the procedure. A Minimum amount of bleeding was controlled with  Pressure. The procedure was tolerated well with a pain level of 0 throughout and a pain level of 0 following the procedure. Post Debridement Measurements: 1.5cm length x 1.5cm width x 0.2cm depth; 0.353cm^3 volume. Post debridement Stage noted as Unstageable/Unclassified. Post procedure Diagnosis Wound #1: Same as Pre-Procedure Mirabal, Edyn ([id number]) Plan Wound Cleansing: Wound #1 Left Calcaneous: Clean wound with Normal Saline. May Shower, gently pat wound dry prior to applying new dressing. Anesthetic: Wound #1 Left Calcaneous: Topical Lidocaine 4% cream applied to wound bed prior to debridement Skin Barriers/Peri-Wound  Care: Wound #1 Left Calcaneous: Skin Prep Primary Wound Dressing: Wound #1 Left Calcaneous: Aquacel Ag Secondary Dressing: Wound #1 Left Calcaneous: Dry Gauze - to top of foot Conform/Kerlix - tape Boardered Foam Dressing Dressing Change Frequency: Wound #1 Left Calcaneous: Change dressing every other day. - for home health purposes Follow-up Appointments: Wound #1 Left Calcaneous: Return Appointment in 1 week. Off-Loading: Wound #1 Left Calcaneous: Heel suspension boot to: - sage boots PEG ASSIST SHOE Turn and reposition every 2 hours Other: - FLOAT HEELS WHEN LYING IN BED Home Health: Wound #1 Left Calcaneous: Continue Home Health Visits - [facility] *****PLEASE CHANGE YOUR DAY TO MONDAYS******* PT IS BEING SEEN IN THE CLINIC ON THURSDAYS NOW******* Home Health Nurse may visit PRN to address patient s wound care needs. FACE TO FACE ENCOUNTER: MEDICARE and MEDICAID PATIENTS: I certify that this patient is under my care and that I had a face-to-face encounter that meets the physician face-to-face encounter requirements with this patient on this date. The encounter with the patient was in whole or in part for the following MEDICAL CONDITION: (primary reason for Home Healthcare) MEDICAL NECESSITY: I certify, that based on my findings, NURSING services are a medically necessary home health service. HOME BOUND STATUS: I certify that my clinical findings support that this patient is homebound (i.e., Due to illness or injury, pt requires aid of supportive devices such as crutches, cane, wheelchairs, walkers, the use of special transportation or the assistance of another person to leave their place of residence. There is a Friske, Linlee ([id number]) normal inability to leave the home and doing so requires considerable and taxing effort. Other absences are for medical reasons / religious services and are infrequent or of short duration when for other reasons). If current dressing  causes regression in wound condition, may D/C ordered dressing product/s and apply Normal Saline Moist Dressing daily until next Wound Healing Center / Other MD appointment. Notify Wound Healing Center of regression in wound condition at [phone]. Please direct any NON-WOUND related issues/requests for orders to patient's Primary Care Physician I have recommended Aquacel Ag to the left heel to be changed daily and on the left ankle on the dorsum to apply a bordered foam. I'm very pleased with the overall progress she's been making and once the granulation tissue fills up better we will try to get her a skin substitute, possibly Apligraf or Grafix. Electronic Signature(s) Signed: 08/08/2015 4:04:03 PM By: Evlyn Kanner MD, FACS Previous Signature: 08/08/2015 9:09:28 AM Version By: Evlyn Kanner MD, FACS Entered By: Evlyn Kanner on 08/08/2015 16:04:03 [name] ([id number]) -------------------------------------------------------------------------------- SuperBill Details Patient Name: [name] Date of Service: 08/08/2015 Medical Record Number: [id number] Patient Account Number: [id number] Date of Birth/Sex: 1928-01-23 (82 y.o. Female) Treating RN: Ashok Cordia, Debi Primary Care Physician: Loney Laurence Other Clinician: Referring Physician: Loney Laurence Treating Physician/Extender: Rudene Re in Treatment: 23 Diagnosis Coding ICD-10 Codes Code Description E11.621 Type 2 diabetes mellitus with foot ulcer L89.620  Pressure ulcer of left heel, unstageable G30.9 Alzheimer's disease, unspecified L97.422 Non-pressure chronic ulcer of left heel and midfoot with fat layer exposed Facility Procedures CPT4 Code Description: 16109604 11042 - DEB SUBQ TISSUE 20 SQ CM/< ICD-10 Description Diagnosis E11.621 Type 2 diabetes mellitus with foot ulcer L89.620 Pressure ulcer of left heel, unstageable G30.9 Alzheimer's disease, unspecified L97.422 Non-pressure  chronic  ulcer of left heel and midfoot Modifier: with fat laye Quantity: 1 r exposed Physician Procedures CPT4 Code Description: 5409811 11042 - WC PHYS SUBQ TISS 20 SQ CM ICD-10 Description Diagnosis E11.621 Type 2 diabetes mellitus with foot ulcer L89.620 Pressure ulcer of left heel, unstageable G30.9 Alzheimer's disease, unspecified L97.422 Non-pressure  chronic ulcer of left heel and midfoot Modifier: with fat laye Quantity: 1 r exposed Electronic Signature(s) Signed: 08/08/2015 9:09:47 AM By: Evlyn Kanner MD, FACS Entered By: Evlyn Kanner on 08/08/2015 09:09:47

## 2015-08-13 NOTE — Progress Notes (Signed)
AIRELLE, EVERDING ([id number]) Visit Report for 08/08/2015 Arrival Information Details Patient Name: [name], [name] Date of Service: 08/08/2015 8:45 AM Medical Record Number: [id number] Patient Account Number: [id number] Date of Birth/Sex: Sep 24, 1927 (82 y.o. Female) Treating RN: Ashok Cordia, Debi Primary Care Physician: Loney Laurence Other Clinician: Referring Physician: Loney Laurence Treating Physician/Extender: Rudene Re in Treatment: 23 Visit Information History Since Last Visit All ordered tests and consults were completed: No Patient Arrived: Wheel Chair Added or deleted any medications: No Arrival Time: 08:41 Any new allergies or adverse reactions: No Accompanied By: caregiver Had a fall or experienced change in No Transfer Assistance: EasyPivot Patient activities of daily living that may affect Lift risk of falls: Patient Identification Verified: Yes Signs or symptoms of abuse/neglect since last No Secondary Verification Process Yes visito Completed: Hospitalized since last visit: No Patient Requires Transmission- No Pain Present Now: No Based Precautions: Patient Has Alerts: Yes Patient Alerts: Patient on Blood Thinner ABI: Non Compressible Electronic Signature(s) Signed: 08/12/2015 4:28:40 PM By: Alejandro Mulling Entered By: Alejandro Mulling on 08/08/2015 08:42:25 [name], [name] ([id number]) -------------------------------------------------------------------------------- Encounter Discharge Information Details Patient Name: [name] Date of Service: 08/08/2015 8:45 AM Medical Record Number: [id number] Patient Account Number: [id number] Date of Birth/Sex: 1928-01-02 (82 y.o. Female) Treating RN: Ashok Cordia, Debi Primary Care Physician: Loney Laurence Other Clinician: Referring Physician: Loney Laurence Treating Physician/Extender: Rudene Re in Treatment: 67 Encounter Discharge Information Items Discharge Pain  Level: 0 Discharge Condition: Stable Ambulatory Status: Wheelchair Discharge Destination: Nursing Home Transportation: Other Accompanied By: caregiver Schedule Follow-up Appointment: Yes Medication Reconciliation completed and provided to Patient/Care Yes Arthurine Oleary: Provided on Clinical Summary of Care: 08/08/2015 Form Type Recipient Paper Patient BM Electronic Signature(s) Signed: 08/08/2015 9:18:40 AM By: Gwenlyn Perking Entered By: Gwenlyn Perking on 08/08/2015 09:18:40 [name], [name] ([id number]) -------------------------------------------------------------------------------- Lower Extremity Assessment Details Patient Name: [name] Date of Service: 08/08/2015 8:45 AM Medical Record Number: [id number] Patient Account Number: [id number] Date of Birth/Sex: 01-Jul-1927 (82 y.o. Female) Treating RN: Ashok Cordia, Debi Primary Care Physician: Loney Laurence Other Clinician: Referring Physician: Loney Laurence Treating Physician/Extender: Rudene Re in Treatment: 23 Vascular Assessment Pulses: Posterior Tibial Dorsalis Pedis Palpable: [Left:Yes] Extremity colors, hair growth, and conditions: Extremity Color: [Left:Normal] Temperature of Extremity: [Left:Warm] Capillary Refill: [Left:< 3 seconds] Toe Nail Assessment Left: Right: Thick: Yes Discolored: Yes Deformed: No Improper Length and Hygiene: No Electronic Signature(s) Signed: 08/12/2015 4:28:40 PM By: Alejandro Mulling Entered By: Alejandro Mulling on 08/08/2015 08:49:09 [name], [name] ([id number]) -------------------------------------------------------------------------------- Multi Wound Chart Details Patient Name: [name] Date of Service: 08/08/2015 8:45 AM Medical Record Number: [id number] Patient Account Number: [id number] Date of Birth/Sex: August 25, 1927 (82 y.o. Female) Treating RN: Ashok Cordia, Debi Primary Care Physician: Loney Laurence Other Clinician: Referring Physician:  Loney Laurence Treating Physician/Extender: Rudene Re in Treatment: 23 Vital Signs Height(in): 64 Pulse(bpm): 81 Weight(lbs): Blood Pressure 140/57 (mmHg): Body Mass Index(BMI): Temperature(F): 98.2 Respiratory Rate 16 (breaths/min): Photos: [1:No Photos] [N/A:N/A] Wound Location: [1:Left Calcaneous] [N/A:N/A] Wounding Event: [1:Pressure Injury] [N/A:N/A] Primary Etiology: [1:Pressure Ulcer] [N/A:N/A] Comorbid History: [1:Anemia, Hypertension, Type II Diabetes, History of pressure wounds, Osteoarthritis, Dementia] [N/A:N/A] Date Acquired: [1:01/30/2015] [N/A:N/A] Weeks of Treatment: [1:23] [N/A:N/A] Wound Status: [1:Open] [N/A:N/A] Measurements L x W x D 2.4x3x0.1 [N/A:N/A] (cm) Area (cm) : [1:5.655] [N/A:N/A] Volume (cm) : [1:0.565] [N/A:N/A] % Reduction in Area: [1:41.20%] [N/A:N/A] % Reduction in Volume: 41.30% [N/A:N/A] Classification: [1:Unstageable/Unclassified] [N/A:N/A] HBO Classification: [1:Grade 1] [N/A:N/A] Exudate Amount: [1:Large] [N/A:N/A] Exudate Type: [1:Serous] [N/A:N/A] Exudate Color: [1:amber] [N/A:N/A] Wound Margin: [1:Indistinct, nonvisible] [N/A:N/A] Granulation Amount: [1:Small (1-33%)] [  N/A:N/A] Granulation Quality: [1:Red, Pink] [N/A:N/A] Necrotic Amount: [1:Large (67-100%)] [N/A:N/A] Exposed Structures: [1:Fascia: No Fat: No Tendon: No Muscle: No] [N/A:N/A] Joint: No Bone: No Limited to Skin Breakdown Epithelialization: None N/A N/A Periwound Skin Texture: Edema: No N/A N/A Excoriation: No Induration: No Callus: No Crepitus: No Fluctuance: No Friable: No Rash: No Scarring: No Periwound Skin Maceration: Yes N/A N/A Moisture: Moist: Yes Dry/Scaly: No Periwound Skin Color: Erythema: Yes N/A N/A Atrophie Blanche: No Cyanosis: No Ecchymosis: No Hemosiderin Staining: No Mottled: No Pallor: No Rubor: No Erythema Location: Circumferential N/A N/A Temperature: No Abnormality N/A N/A Tenderness on Yes N/A  N/A Palpation: Wound Preparation: Ulcer Cleansing: N/A N/A Rinsed/Irrigated with Saline Topical Anesthetic Applied: Other: lidocaine 4% Treatment Notes Electronic Signature(s) Signed: 08/12/2015 4:28:40 PM By: Alejandro MullingPinkerton, Debra Entered By: Alejandro MullingPinkerton, Debra on 08/08/2015 08:53:23 Hartner, Khaliyah ([id number]) -------------------------------------------------------------------------------- Multi-Disciplinary Care Plan Details Patient Name: Gloria FateMORDECAI, [name] Date of Service: 08/08/2015 8:45 AM Medical Record Number: [id number] Patient Account Number: [id number] Date of Birth/Sex: [birth year] (82 y.o. Female) Treating RN: Ashok CordiaPinkerton, Debi Primary Care Physician: Loney LaurenceAMPBELL, MARGARET Other Clinician: Referring Physician: Loney LaurenceAMPBELL, MARGARET Treating Physician/Extender: Rudene ReBritto, Errol Weeks in Treatment: 4723 Active Inactive Orientation to the Wound Care Program Nursing Diagnoses: Knowledge deficit related to the wound healing center program Goals: Patient/caregiver will verbalize understanding of the Wound Healing Center Program Date Initiated: 02/27/2015 Goal Status: Active Interventions: Provide education on orientation to the wound center Notes: Pressure Nursing Diagnoses: Knowledge deficit related to management of pressures ulcers Potential for impaired tissue integrity related to pressure, friction, moisture, and shear Goals: Patient will remain free from development of additional pressure ulcers Date Initiated: 02/27/2015 Goal Status: Active Patient will remain free of pressure ulcers Date Initiated: 02/27/2015 Goal Status: Active Patient/caregiver will verbalize risk factors for pressure ulcer development Date Initiated: 02/27/2015 Goal Status: Active Patient/caregiver will verbalize understanding of pressure ulcer management Date Initiated: 02/27/2015 Goal Status: Active Interventions: Assess: immobility, friction, shearing, incontinence upon admission and as  needed Uhlir, Reilyn ([id number]) Assess offloading mechanisms upon admission and as needed Assess potential for pressure ulcer upon admission and as needed Provide education on pressure ulcers Treatment Activities: Patient referred for home evaluation of offloading devices/mattresses : 08/08/2015 Patient referred for pressure reduction/relief devices : 08/08/2015 Notes: Wound/Skin Impairment Nursing Diagnoses: Impaired tissue integrity Knowledge deficit related to ulceration/compromised skin integrity Goals: Patient/caregiver will verbalize understanding of skin care regimen Date Initiated: 02/27/2015 Goal Status: Active Ulcer/skin breakdown will have a volume reduction of 30% by week 4 Date Initiated: 02/27/2015 Goal Status: Active Ulcer/skin breakdown will have a volume reduction of 50% by week 8 Date Initiated: 02/27/2015 Goal Status: Active Ulcer/skin breakdown will have a volume reduction of 80% by week 12 Date Initiated: 02/27/2015 Goal Status: Active Ulcer/skin breakdown will heal within 14 weeks Date Initiated: 02/27/2015 Goal Status: Active Interventions: Assess patient/caregiver ability to obtain necessary supplies Assess patient/caregiver ability to perform ulcer/skin care regimen upon admission and as needed Assess ulceration(s) every visit Provide education on ulcer and skin care Treatment Activities: Skin care regimen initiated : 08/08/2015 Notes: Electronic Signature(s) Gloria FateMORDECAI, [name] ([id number]) Signed: 08/12/2015 4:28:40 PM By: Alejandro MullingPinkerton, Debra Entered By: Alejandro MullingPinkerton, Debra on 08/08/2015 08:53:17 Tzeng, [name] ([id number]) -------------------------------------------------------------------------------- Pain Assessment Details Patient Name: Gloria FateMORDECAI, [name] Date of Service: 08/08/2015 8:45 AM Medical Record Number: [id number] Patient Account Number: [id number] Date of Birth/Sex: [birth year] (82 y.o. Female) Treating RN: Ashok CordiaPinkerton, Debi Primary Care  Physician: Loney LaurenceAMPBELL, MARGARET Other Clinician: Referring Physician: Loney LaurenceAMPBELL, MARGARET Treating Physician/Extender: Rudene ReBritto, Errol Weeks in Treatment: 2423 Active Problems Location of  Pain Severity and Description of Pain Patient Has Paino No Site Locations Pain Management and Medication Current Pain Management: Electronic Signature(s) Signed: 08/12/2015 4:28:40 PM By: Alejandro Mulling Entered By: Alejandro Mulling on 08/08/2015 08:42:31 Kinney, Tishanna ([id number]) -------------------------------------------------------------------------------- Patient/Caregiver Education Details Patient Name: [name] Date of Service: 08/08/2015 8:45 AM Medical Record Number: [id number] Patient Account Number: [id number] Date of Birth/Gender: December 04, 1927 (82 y.o. Female) Treating RN: Ashok Cordia, Debi Primary Care Physician: Loney Laurence Other Clinician: Referring Physician: Loney Laurence Treating Physician/Extender: Rudene Re in Treatment: 35 Education Assessment Education Provided To: Patient and Caregiver Education Topics Provided Wound/Skin Impairment: Handouts: Other: change dressing as ordered Methods: Demonstration, Explain/Verbal Responses: State content correctly Electronic Signature(s) Signed: 08/12/2015 4:28:40 PM By: Alejandro Mulling Entered By: Alejandro Mulling on 08/08/2015 09:17:55 Hogans, [name] ([id number]) -------------------------------------------------------------------------------- Wound Assessment Details Patient Name: [name] Date of Service: 08/08/2015 8:45 AM Medical Record Number: [id number] Patient Account Number: [id number] Date of Birth/Sex: 20-Jan-1928 (82 y.o. Female) Treating RN: Ashok Cordia, Debi Primary Care Physician: Loney Laurence Other Clinician: Referring Physician: Loney Laurence Treating Physician/Extender: Rudene Re in Treatment: 23 Wound Status Wound Number: 1 Primary Pressure  Ulcer Etiology: Wound Location: Left Calcaneous Wound Open Wounding Event: Pressure Injury Status: Date Acquired: 01/30/2015 Comorbid Anemia, Hypertension, Type II Weeks Of Treatment: 23 History: Diabetes, History of pressure wounds, Clustered Wound: No Osteoarthritis, Dementia Photos Photo Uploaded By: Alejandro Mulling on 08/08/2015 17:16:09 Wound Measurements Length: (cm) 1.3 Width: (cm) 1.5 Depth: (cm) 0.1 Area: (cm) 1.532 Volume: (cm) 0.153 % Reduction in Area: 84.1% % Reduction in Volume: 84.1% Epithelialization: None Tunneling: No Undermining: No Wound Description Classification: Unstageable/Unclassified Foul Od Diabetic Severity (Wagner): Grade 1 Wound Margin: Indistinct, nonvisible Exudate Amount: Large Exudate Type: Serous Exudate Color: amber or After Cleansing: No Wound Bed Granulation Amount: Small (1-33%) Exposed Structure Granulation Quality: Red, Pink Fascia Exposed: No Necrotic Amount: Large (67-100%) Fat Layer Exposed: No Rhodus, [name] ([id number]) Necrotic Quality: Adherent Slough Tendon Exposed: No Muscle Exposed: No Joint Exposed: No Bone Exposed: No Limited to Skin Breakdown Periwound Skin Texture Texture Color No Abnormalities Noted: No No Abnormalities Noted: No Callus: No Atrophie Blanche: No Crepitus: No Cyanosis: No Excoriation: No Ecchymosis: No Fluctuance: No Erythema: Yes Friable: No Erythema Location: Circumferential Induration: No Hemosiderin Staining: No Localized Edema: No Mottled: No Rash: No Pallor: No Scarring: No Rubor: No Moisture Temperature / Pain No Abnormalities Noted: No Temperature: No Abnormality Dry / Scaly: No Tenderness on Palpation: Yes Maceration: Yes Moist: Yes Wound Preparation Ulcer Cleansing: Rinsed/Irrigated with Saline Topical Anesthetic Applied: Other: lidocaine 4%, Treatment Notes Wound #1 (Left Calcaneous) 1. Cleansed with: Clean wound with Normal Saline 2.  Anesthetic Topical Lidocaine 4% cream to wound bed prior to debridement 3. Peri-wound Care: Skin Prep 4. Dressing Applied: Aquacel Ag 5. Secondary Dressing Applied Bordered Foam Dressing Dry Gauze Kerlix/Conform 7. Secured with Secretary/administrator) Signed: 08/12/2015 4:28:40 PM By: Norman Herrlich, [name] ([id number]) Entered By: Alejandro Mulling on 08/08/2015 09:03:44 Frett, [name] ([id number]) -------------------------------------------------------------------------------- Vitals Details Patient Name: [name] Date of Service: 08/08/2015 8:45 AM Medical Record Number: [id number] Patient Account Number: [id number] Date of Birth/Sex: 01-Feb-1928 (82 y.o. Female) Treating RN: Ashok Cordia, Debi Primary Care Physician: Loney Laurence Other Clinician: Referring Physician: Loney Laurence Treating Physician/Extender: Rudene Re in Treatment: 23 Vital Signs Time Taken: 08:42 Temperature (F): 98.2 Height (in): 64 Pulse (bpm): 81 Respiratory Rate (breaths/min): 16 Blood Pressure (mmHg): 140/57 Reference Range: 80 - 120 mg / dl Electronic Signature(s) Signed: 08/12/2015 4:28:40 PM By: Alejandro Mulling Entered  By: Alejandro Mulling on 08/08/2015 08:44:47

## 2015-08-15 ENCOUNTER — Encounter: Payer: Medicare Other | Attending: Surgery | Admitting: Surgery

## 2015-08-15 DIAGNOSIS — G309 Alzheimer's disease, unspecified: Secondary | ICD-10-CM | POA: Diagnosis not present

## 2015-08-15 DIAGNOSIS — M1990 Unspecified osteoarthritis, unspecified site: Secondary | ICD-10-CM | POA: Insufficient documentation

## 2015-08-15 DIAGNOSIS — E11621 Type 2 diabetes mellitus with foot ulcer: Secondary | ICD-10-CM | POA: Insufficient documentation

## 2015-08-15 DIAGNOSIS — I70245 Atherosclerosis of native arteries of left leg with ulceration of other part of foot: Secondary | ICD-10-CM | POA: Insufficient documentation

## 2015-08-15 DIAGNOSIS — L89620 Pressure ulcer of left heel, unstageable: Secondary | ICD-10-CM | POA: Diagnosis not present

## 2015-08-15 DIAGNOSIS — E039 Hypothyroidism, unspecified: Secondary | ICD-10-CM | POA: Diagnosis not present

## 2015-08-15 DIAGNOSIS — I10 Essential (primary) hypertension: Secondary | ICD-10-CM | POA: Insufficient documentation

## 2015-08-15 DIAGNOSIS — L97422 Non-pressure chronic ulcer of left heel and midfoot with fat layer exposed: Secondary | ICD-10-CM | POA: Insufficient documentation

## 2015-08-19 NOTE — Progress Notes (Signed)
Gloria, [name] ([id number]) Visit Report for 08/15/2015 Arrival Information Details Patient Name: [name], [name] Date of Service: 08/15/2015 8:45 AM Medical Record Number: [id number] Patient Account Number: [id number] Date of Birth/Sex: 1928/01/05 (82 y.o. Female) Treating RN: Ashok Cordia, Debi Primary Care Physician: Loney Laurence Other Clinician: Referring Physician: Loney Laurence Treating Physician/Extender: Rudene Re in Treatment: 24 Visit Information History Since Last Visit All ordered tests and consults were completed: No Patient Arrived: Wheel Chair Added or deleted any medications: No Arrival Time: 08:41 Any new allergies or adverse reactions: No Accompanied By: caregiver Had a fall or experienced change in No Transfer Assistance: EasyPivot Patient activities of daily living that may affect Lift risk of falls: Patient Identification Verified: Yes Signs or symptoms of abuse/neglect since last No Secondary Verification Process Yes visito Completed: Hospitalized since last visit: No Patient Requires Transmission- No Pain Present Now: No Based Precautions: Patient Has Alerts: Yes Patient Alerts: Patient on Blood Thinner ABI: Non Compressible Electronic Signature(s) Signed: 08/19/2015 4:14:54 PM By: Alejandro Mulling Entered By: Alejandro Mulling on 08/15/2015 08:41:36 [name] ([id number]) -------------------------------------------------------------------------------- Clinic Level of Care Assessment Details Patient Name: [name] Date of Service: 08/15/2015 8:45 AM Medical Record Number: [id number] Patient Account Number: [id number] Date of Birth/Sex: 1927-08-17 (82 y.o. Female) Treating RN: Ashok Cordia, Debi Primary Care Physician: Loney Laurence Other Clinician: Referring Physician: Loney Laurence Treating Physician/Extender: Rudene Re in Treatment: 24 Clinic Level of Care Assessment Items TOOL 4 Quantity  Score  - Use when only an EandM is performed on FOLLOW-UP visit 0 ASSESSMENTS - Nursing Assessment / Reassessment  - Reassessment of Co-morbidities (includes updates in patient status) 0 X - Reassessment of Adherence to Treatment Plan 1 5 ASSESSMENTS - Wound and Skin Assessment / Reassessment X - Simple Wound Assessment / Reassessment - one wound 1 5  - Complex Wound Assessment / Reassessment - multiple wounds 0  - Dermatologic / Skin Assessment (not related to wound area) 0 ASSESSMENTS - Focused Assessment  - Circumferential Edema Measurements - multi extremities 0  - Nutritional Assessment / Counseling / Intervention 0  - Lower Extremity Assessment (monofilament, tuning fork, pulses) 0  - Peripheral Arterial Disease Assessment (using hand held doppler) 0 ASSESSMENTS - Ostomy and/or Continence Assessment and Care  - Incontinence Assessment and Management 0  - Ostomy Care Assessment and Management (repouching, etc.) 0 PROCESS - Coordination of Care  - Simple Patient / Family Education for ongoing care 0 X - Complex (extensive) Patient / Family Education for ongoing care 1 20 X - Staff obtains Chiropractor, Records, Test Results / Process Orders 1 10 X - Staff telephones HHA, Nursing Homes / Clarify orders / etc 1 10  - Routine Transfer to another Facility (non-emergent condition) 0 [name] ([id number])  - Routine Hospital Admission (non-emergent condition) 0  - New Admissions / Manufacturing engineer / Ordering NPWT, Apligraf, etc. 0  - Emergency Hospital Admission (emergent condition) 0 X - Simple Discharge Coordination 1 10  - Complex (extensive) Discharge Coordination 0 PROCESS - Special Needs  - Pediatric / Minor Patient Management 0  - Isolation Patient Management 0  - Hearing / Language / Visual special needs 0  - Assessment of Community assistance (transportation, D/C planning, etc.) 0  - Additional assistance / Altered  mentation 0  - Support Surface(s) Assessment (bed, cushion, seat, etc.) 0 INTERVENTIONS - Wound Cleansing / Measurement X - Simple Wound Cleansing - one wound 1 5  - Complex Wound Cleansing - multiple wounds 0 X - Wound Imaging (photographs -  any number of wounds) 1 5 []  - Wound Tracing (instead of photographs) 0 X - Simple Wound Measurement - one wound 1 5 []  - Complex Wound Measurement - multiple wounds 0 INTERVENTIONS - Wound Dressings []  - Small Wound Dressing one or multiple wounds 0 X - Medium Wound Dressing one or multiple wounds 1 15 []  - Large Wound Dressing one or multiple wounds 0 X - Application of Medications - topical 1 5 []  - Application of Medications - injection 0 INTERVENTIONS - Miscellaneous []  - External ear exam 0 [name] ([id number]) []  - Specimen Collection (cultures, biopsies, blood, body fluids, etc.) 0 []  - Specimen(s) / Culture(s) sent or taken to Lab for analysis 0 []  - Patient Transfer (multiple staff / Michiel Sites Lift / Similar devices) 0 []  - Simple Staple / Suture removal (25 or less) 0 []  - Complex Staple / Suture removal (26 or more) 0 []  - Hypo / Hyperglycemic Management (close monitor of Blood Glucose) 0 []  - Ankle / Brachial Index (ABI) - do not check if billed separately 0 X - Vital Signs 1 5 Has the patient been seen at the hospital within the last three years: Yes Total Score: 100 Level Of Care: New/Established - Level 3 Electronic Signature(s) Signed: 08/19/2015 4:14:54 PM By: Alejandro Mulling Entered By: Alejandro Mulling on 08/15/2015 11:46:52 [name] ([id number]) -------------------------------------------------------------------------------- Encounter Discharge Information Details Patient Name: [name] Date of Service: 08/15/2015 8:45 AM Medical Record Number: [id number] Patient Account Number: [id number] Date of Birth/Sex: 1927/12/14 (82 y.o. Female) Treating RN: Ashok Cordia, Debi Primary Care Physician:  Loney Laurence Other Clinician: Referring Physician: Loney Laurence Treating Physician/Extender: Rudene Re in Treatment: 24 Encounter Discharge Information Items Discharge Pain Level: 0 Discharge Condition: Stable Ambulatory Status: Wheelchair Discharge Destination: Nursing Home Transportation: Other Accompanied By: caregiver Schedule Follow-up Appointment: Yes Medication Reconciliation completed Yes and provided to Patient/Care Bond Grieshop: Provided on Clinical Summary of Care: 08/15/2015 Form Type Recipient Paper Patient BM Electronic Signature(s) Signed: 08/15/2015 9:10:30 AM By: Gwenlyn Perking Entered By: Gwenlyn Perking on 08/15/2015 09:10:30 Gair, [name] ([id number]) -------------------------------------------------------------------------------- Lower Extremity Assessment Details Patient Name: [name] Date of Service: 08/15/2015 8:45 AM Medical Record Number: [id number] Patient Account Number: [id number] Date of Birth/Sex: 08-Sep-1927 (82 y.o. Female) Treating RN: Ashok Cordia, Debi Primary Care Physician: Loney Laurence Other Clinician: Referring Physician: Loney Laurence Treating Physician/Extender: Rudene Re in Treatment: 24 Vascular Assessment Pulses: Posterior Tibial Dorsalis Pedis Palpable: [Left:Yes] Extremity colors, hair growth, and conditions: Extremity Color: [Left:Normal] Temperature of Extremity: [Left:Warm] Capillary Refill: [Left:< 3 seconds] Toe Nail Assessment Left: Right: Thick: Yes Discolored: Yes Deformed: Yes Improper Length and Hygiene: Yes Electronic Signature(s) Signed: 08/19/2015 4:14:54 PM By: Alejandro Mulling Entered By: Alejandro Mulling on 08/15/2015 08:50:29 Folino, Tequilla ([id number]) -------------------------------------------------------------------------------- Multi Wound Chart Details Patient Name: [name] Date of Service: 08/15/2015 8:45 AM Medical Record Number:  [id number] Patient Account Number: [id number] Date of Birth/Sex: 1928/01/01 (82 y.o. Female) Treating RN: Ashok Cordia, Debi Primary Care Physician: Loney Laurence Other Clinician: Referring Physician: Loney Laurence Treating Physician/Extender: Rudene Re in Treatment: 24 Vital Signs Height(in): 64 Pulse(bpm): 80 Weight(lbs): Blood Pressure 121/54 (mmHg): Body Mass Index(BMI): Temperature(F): 98.3 Respiratory Rate 16 (breaths/min): Photos: [1:No Photos] [N/A:N/A] Wound Location: [1:Left Calcaneous] [N/A:N/A] Wounding Event: [1:Pressure Injury] [N/A:N/A] Primary Etiology: [1:Pressure Ulcer] [N/A:N/A] Comorbid History: [1:Anemia, Hypertension, Type II Diabetes, History of pressure wounds, Osteoarthritis, Dementia] [N/A:N/A] Date Acquired: [1:01/30/2015] [N/A:N/A] Weeks of Treatment: [1:24] [N/A:N/A] Wound Status: [1:Open] [N/A:N/A] Measurements L x W x D 1.2x1x0.2 [N/A:N/A] (cm) Area (cm) : [  1:0.942] [N/A:N/A] Volume (cm) : [1:0.188] [N/A:N/A] % Reduction in Area: [1:90.20%] [N/A:N/A] % Reduction in Volume: 80.50% [N/A:N/A] Classification: [1:Unstageable/Unclassified] [N/A:N/A] HBO Classification: [1:Grade 1] [N/A:N/A] Exudate Amount: [1:Large] [N/A:N/A] Exudate Type: [1:Serosanguineous] [N/A:N/A] Exudate Color: [1:red, brown] [N/A:N/A] Wound Margin: [1:Indistinct, nonvisible] [N/A:N/A] Granulation Amount: [1:Large (67-100%)] [N/A:N/A] Granulation Quality: [1:Red, Pink] [N/A:N/A] Necrotic Amount: [1:Small (1-33%)] [N/A:N/A] Exposed Structures: [1:Fascia: No Fat: No Tendon: No Muscle: No] [N/A:N/A] Joint: No Bone: No Limited to Skin Breakdown Epithelialization: None N/A N/A Periwound Skin Texture: Edema: No N/A N/A Excoriation: No Induration: No Callus: No Crepitus: No Fluctuance: No Friable: No Rash: No Scarring: No Periwound Skin Maceration: Yes N/A N/A Moisture: Moist: Yes Dry/Scaly: No Periwound Skin Color: Erythema: Yes N/A  N/A Atrophie Blanche: No Cyanosis: No Ecchymosis: No Hemosiderin Staining: No Mottled: No Pallor: No Rubor: No Erythema Location: Circumferential N/A N/A Temperature: No Abnormality N/A N/A Tenderness on Yes N/A N/A Palpation: Wound Preparation: Ulcer Cleansing: N/A N/A Rinsed/Irrigated with Saline Topical Anesthetic Applied: Other: lidocaine 4% Treatment Notes Electronic Signature(s) Signed: 08/19/2015 4:14:54 PM By: Alejandro Mulling Entered By: Alejandro Mulling on 08/15/2015 08:57:59 Cavey, Faun ([id number]) -------------------------------------------------------------------------------- Multi-Disciplinary Care Plan Details Patient Name: [name] Date of Service: 08/15/2015 8:45 AM Medical Record Number: [id number] Patient Account Number: [id number] Date of Birth/Sex: Oct 23, 1927 (82 y.o. Female) Treating RN: Ashok Cordia, Debi Primary Care Physician: Loney Laurence Other Clinician: Referring Physician: Loney Laurence Treating Physician/Extender: Rudene Re in Treatment: 29 Active Inactive Orientation to the Wound Care Program Nursing Diagnoses: Knowledge deficit related to the wound healing center program Goals: Patient/caregiver will verbalize understanding of the Wound Healing Center Program Date Initiated: 02/27/2015 Goal Status: Active Interventions: Provide education on orientation to the wound center Notes: Pressure Nursing Diagnoses: Knowledge deficit related to management of pressures ulcers Potential for impaired tissue integrity related to pressure, friction, moisture, and shear Goals: Patient will remain free from development of additional pressure ulcers Date Initiated: 02/27/2015 Goal Status: Active Patient will remain free of pressure ulcers Date Initiated: 02/27/2015 Goal Status: Active Patient/caregiver will verbalize risk factors for pressure ulcer development Date Initiated: 02/27/2015 Goal Status:  Active Patient/caregiver will verbalize understanding of pressure ulcer management Date Initiated: 02/27/2015 Goal Status: Active Interventions: Assess: immobility, friction, shearing, incontinence upon admission and as needed Furia, Mishel ([id number]) Assess offloading mechanisms upon admission and as needed Assess potential for pressure ulcer upon admission and as needed Provide education on pressure ulcers Treatment Activities: Patient referred for home evaluation of offloading devices/mattresses : 08/15/2015 Patient referred for pressure reduction/relief devices : 08/15/2015 Notes: Wound/Skin Impairment Nursing Diagnoses: Impaired tissue integrity Knowledge deficit related to ulceration/compromised skin integrity Goals: Patient/caregiver will verbalize understanding of skin care regimen Date Initiated: 02/27/2015 Goal Status: Active Ulcer/skin breakdown will have a volume reduction of 30% by week 4 Date Initiated: 02/27/2015 Goal Status: Active Ulcer/skin breakdown will have a volume reduction of 50% by week 8 Date Initiated: 02/27/2015 Goal Status: Active Ulcer/skin breakdown will have a volume reduction of 80% by week 12 Date Initiated: 02/27/2015 Goal Status: Active Ulcer/skin breakdown will heal within 14 weeks Date Initiated: 02/27/2015 Goal Status: Active Interventions: Assess patient/caregiver ability to obtain necessary supplies Assess patient/caregiver ability to perform ulcer/skin care regimen upon admission and as needed Assess ulceration(s) every visit Provide education on ulcer and skin care Treatment Activities: Skin care regimen initiated : 08/15/2015 Notes: Electronic Signature(s) ERMINIA, MCNEW ([id number]) Signed: 08/19/2015 4:14:54 PM By: Alejandro Mulling Entered By: Alejandro Mulling on 08/15/2015 08:57:51 Benison, Toneka ([id number]) -------------------------------------------------------------------------------- Pain Assessment Details Patient  Name: Avis Epley,  Kimorah Date of Service: 08/15/2015 8:45 AM Medical Record Number: [id number] Patient Account Number: [id number] Date of Birth/Sex: 1927/11/02 (82 y.o. Female) Treating RN: Ashok CordiaPinkerton, Debi Primary Care Physician: Loney LaurenceAMPBELL, MARGARET Other Clinician: Referring Physician: Loney LaurenceAMPBELL, MARGARET Treating Physician/Extender: Rudene ReBritto, Errol Weeks in Treatment: 24 Active Problems Location of Pain Severity and Description of Pain Patient Has Paino No Site Locations Pain Management and Medication Current Pain Management: Electronic Signature(s) Signed: 08/19/2015 4:14:54 PM By: Alejandro MullingPinkerton, Debra Entered By: Alejandro MullingPinkerton, Debra on 08/15/2015 08:41:42 Dier, Tykia ([id number]) -------------------------------------------------------------------------------- Patient/Caregiver Education Details Patient Name: Gloria FateMORDECAI, Baelyn Date of Service: 08/15/2015 8:45 AM Medical Record Number: [id number] Patient Account Number: [id number] Date of Birth/Gender: 1927/11/02 (82 y.o. Female) Treating RN: Ashok CordiaPinkerton, Debi Primary Care Physician: Loney LaurenceAMPBELL, MARGARET Other Clinician: Referring Physician: Loney LaurenceAMPBELL, MARGARET Treating Physician/Extender: Rudene ReBritto, Errol Weeks in Treatment: 24 Education Assessment Education Provided To: Patient and Caregiver Education Topics Provided Wound/Skin Impairment: Handouts: Other: change dressing as ordered Methods: Demonstration, Explain/Verbal Responses: State content correctly Electronic Signature(s) Signed: 08/19/2015 4:14:54 PM By: Alejandro MullingPinkerton, Debra Entered By: Alejandro MullingPinkerton, Debra on 08/15/2015 09:03:20 Withington, Vida ([id number]) -------------------------------------------------------------------------------- Wound Assessment Details Patient Name: Gloria FateMORDECAI, Dannika Date of Service: 08/15/2015 8:45 AM Medical Record Number: [id number] Patient Account Number: [id number] Date of Birth/Sex: 1927/11/02 (82 y.o. Female) Treating RN: Ashok CordiaPinkerton, Debi Primary Care  Physician: Loney LaurenceAMPBELL, MARGARET Other Clinician: Referring Physician: Loney LaurenceAMPBELL, MARGARET Treating Physician/Extender: Rudene ReBritto, Errol Weeks in Treatment: 24 Wound Status Wound Number: 1 Primary Pressure Ulcer Etiology: Wound Location: Left Calcaneous Wound Open Wounding Event: Pressure Injury Status: Date Acquired: 01/30/2015 Comorbid Anemia, Hypertension, Type II Weeks Of Treatment: 24 History: Diabetes, History of pressure wounds, Clustered Wound: No Osteoarthritis, Dementia Photos Photo Uploaded By: Alejandro MullingPinkerton, Debra on 08/15/2015 16:40:01 Wound Measurements Length: (cm) 1.2 Width: (cm) 1 Depth: (cm) 0.2 Area: (cm) 0.942 Volume: (cm) 0.188 % Reduction in Area: 90.2% % Reduction in Volume: 80.5% Epithelialization: None Tunneling: No Undermining: No Wound Description Classification: Unstageable/Unclassified Foul Od Diabetic Severity (Wagner): Grade 1 Wound Margin: Indistinct, nonvisible Exudate Amount: Large Exudate Type: Serosanguineous Exudate Color: red, brown or After Cleansing: No Wound Bed Granulation Amount: Large (67-100%) Exposed Structure Granulation Quality: Red, Pink Fascia Exposed: No Necrotic Amount: Small (1-33%) Fat Layer Exposed: No Ashline, Ryin ([id number]) Necrotic Quality: Adherent Slough Tendon Exposed: No Muscle Exposed: No Joint Exposed: No Bone Exposed: No Limited to Skin Breakdown Periwound Skin Texture Texture Color No Abnormalities Noted: No No Abnormalities Noted: No Callus: No Atrophie Blanche: No Crepitus: No Cyanosis: No Excoriation: No Ecchymosis: No Fluctuance: No Erythema: Yes Friable: No Erythema Location: Circumferential Induration: No Hemosiderin Staining: No Localized Edema: No Mottled: No Rash: No Pallor: No Scarring: No Rubor: No Moisture Temperature / Pain No Abnormalities Noted: No Temperature: No Abnormality Dry / Scaly: No Tenderness on Palpation: Yes Maceration: Yes Moist: Yes Wound  Preparation Ulcer Cleansing: Rinsed/Irrigated with Saline Topical Anesthetic Applied: Other: lidocaine 4%, Treatment Notes Wound #1 (Left Calcaneous) 1. Cleansed with: Clean wound with Normal Saline 2. Anesthetic Topical Lidocaine 4% cream to wound bed prior to debridement 3. Peri-wound Care: Skin Prep 4. Dressing Applied: Aquacel Ag 5. Secondary Dressing Applied Bordered Foam Dressing Electronic Signature(s) Signed: 08/19/2015 4:14:54 PM By: Alejandro MullingPinkerton, Debra Entered By: Alejandro MullingPinkerton, Debra on 08/15/2015 08:49:13 Rummell, Katie ([id number]) -------------------------------------------------------------------------------- Vitals Details Patient Name: Gloria FateMORDECAI, [place] Date of Service: 08/15/2015 8:45 AM Medical Record Number: [id number] Patient Account Number: [id number] Date of Birth/Sex: 1927/11/02 (82 y.o. Female) Treating RN: Ashok CordiaPinkerton, Debi Primary Care Physician: Loney LaurenceAMPBELL, MARGARET Other Clinician: Referring Physician: Loney LaurenceAMPBELL, MARGARET Treating Physician/Extender: Rudene ReBritto, Errol Weeks in Treatment: 24  Vital Signs Time Taken: 08:41 Temperature (F): 98.3 Height (in): 64 Pulse (bpm): 80 Respiratory Rate (breaths/min): 16 Blood Pressure (mmHg): 121/54 Reference Range: 80 - 120 mg / dl Electronic Signature(s) Signed: 08/19/2015 4:14:54 PM By: Alejandro Mulling Entered By: Alejandro Mulling on 08/15/2015 08:43:41

## 2015-08-19 NOTE — Progress Notes (Signed)
FRANCIES, INCH ([id number]) Visit Report for 08/15/2015 Chief Complaint Document Details Patient Name: [name], [name] Date of Service: 08/15/2015 8:45 AM Medical Record Number: [id number] Patient Account Number: [id number] Date of Birth/Sex: 26-May-1927 (82 y.o. Female) Treating RN: Ashok Cordia, Debi Primary Care Physician: Loney Laurence Other Clinician: Referring Physician: Loney Laurence Treating Physician/Extender: Rudene Re in Treatment: 24 Information Obtained from: Patient Chief Complaint patient is back to see [place] today for diabetic foot ulcer which she's had for several months. Electronic Signature(s) Signed: 08/15/2015 9:04:16 AM By: Evlyn Kanner MD, FACS Entered By: Evlyn Kanner on 08/15/2015 09:04:16 [name] ([id number]) -------------------------------------------------------------------------------- HPI Details Patient Name: [name] Date of Service: 08/15/2015 8:45 AM Medical Record Number: [id number] Patient Account Number: [id number] Date of Birth/Sex: 1927/07/01 (82 y.o. Female) Treating RN: Ashok Cordia, Debi Primary Care Physician: Loney Laurence Other Clinician: Referring Physician: Loney Laurence Treating Physician/Extender: Rudene Re in Treatment: 24 History of Present Illness Location: left heel and dorsum of the foot Quality: Patient reports No Pain. Severity: Patient states wound (s) are getting better. Duration: the patient has had surgery in early December and had vascular testing ordered but not done. Timing: Pain in wound is Intermittent (comes and goes Context: The wound appeared gradually over time Modifying Factors: Other treatment(s) tried include: local dressings and offloading. surgery done by Dr. Graciela Husbands for debridement of the left heel wound Associated Signs and Symptoms: Patient reports having difficulty standing for long periods. HPI Description: 82 year old patient with past medical history of  Alzheimer's dementia, diabetes mellitus, hypertension and recent history of right hip arthroplasty in August 2016. Her past medical history is significant also for hypothyroidism, hypertension, urinary retention, dementia. 04/11/2015 -- she has had a lot of pain and left heel and there has been a lot of drainage today with purulent material. He has not been running fever. 04/18/2015 -- last week she was here with a florid infection of her left heel and I had sent her to the hospital for admission. She was admitted between 12/ 2 and 12/ 5 and she underwent a debridement of the left heel abscess with placement of antibiotic beads with vancomycin, by Dr. Linus Galas. Her cultures grew gram- negative bacteria, was given vancomycin and Unasyn IV in hospital and she was continued on oral antibiotics and was given documented for 10 days. at the time of discharge Dr. Alberteen Spindle wanted the dressing to be intact for about a week before it would need changing. 05/29/2014 -- she has not been to see [place] for the last 6 weeks and from what I understand she has been in rehabilitation facility. No vascular testing was done as we had requested. She has a diabetic foot ulcer which was graded as a Wagner 3 when she was seen last about a month and a half ago. There is no family member to discuss hyperbaric oxygen therapy and the patient has as Alzheimer's dementia and I do not believe she understands our discussions 06/12/2015 -- a vascular appointment is pending for tomorrow. 06/19/2015 -- the vascular workup revealed that the left lower extremity had a 50-99% stenosis in the left superficial femoral, popliteal and tibioperoneal trunk. 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 Dr. Wyn Quaker has recommended angiographically and possible stent placement for this critical ischemia. 07/03/2015 -- he is going to have a stent placement done by Dr. Wyn Quaker this coming Monday. 07/11/2015 -- she was taken for an  aortogram and found to have diffuse disease SFA with a high-grade near occlusive stenosis  in the distal SFA just above Hunter's canal. One-vessel runoff being the wound area and to get tibial artery at the proximal anterior tibial artery had a short segment occlusion in the distal anterior tibial artery and a moderate to high-grade disease of about 10-12 cm. she had a percutaneous angioplasty of the distal and middle left anterior tibial artery, proximal left anterior tibial artery and distal SFA and proximal popliteal artery. PHILLIP, SANDLER ([id number]) Electronic Signature(s) Signed: 08/15/2015 9:06:31 AM By: Evlyn Kanner MD, FACS Entered By: Evlyn Kanner on 08/15/2015 09:06:31 KAM, KUSHNIR ([id number]) -------------------------------------------------------------------------------- Physical Exam Details Patient Name: [name] Date of Service: 08/15/2015 8:45 AM Medical Record Number: [id number] Patient Account Number: [id number] Date of Birth/Sex: 1927/09/27 (82 y.o. Female) Treating RN: Ashok Cordia, Debi Primary Care Physician: Loney Laurence Other Clinician: Referring Physician: Loney Laurence Treating Physician/Extender: Rudene Re in Treatment: 24 Constitutional . Pulse regular. Respirations normal and unlabored. Afebrile. . Eyes Nonicteric. Reactive to light. Ears, Nose, Mouth, and Throat Lips, teeth, and gums WNL.Marland Kitchen Moist mucosa without lesions. Neck supple and nontender. No palpable supraclavicular or cervical adenopathy. Normal sized without goiter. Respiratory WNL. No retractions.. Cardiovascular Pedal Pulses WNL. No clubbing, cyanosis or edema. Lymphatic No adneopathy. No adenopathy. No adenopathy. Musculoskeletal Adexa without tenderness or enlargement.. Digits and nails w/o clubbing, cyanosis, infection, petechiae, ischemia, or inflammatory conditions.. Integumentary (Hair, Skin) No suspicious lesions. No crepitus or fluctuance. No  peri-wound warmth or erythema. No masses.Marland Kitchen Psychiatric Judgement and insight Intact.. No evidence of depression, anxiety, or agitation.. Notes the medial heel has minimal maceration today and the wound once washed out with moist saline and gauze is looking clean. There is brisk bleeding which was controlled with pressure Electronic Signature(s) Signed: 08/15/2015 9:07:11 AM By: Evlyn Kanner MD, FACS Entered By: Evlyn Kanner on 08/15/2015 09:07:11 [name] ([id number]) -------------------------------------------------------------------------------- Physician Orders Details Patient Name: [name] Date of Service: 08/15/2015 8:45 AM Medical Record Number: [id number] Patient Account Number: [id number] Date of Birth/Sex: November 29, 1927 (82 y.o. Female) Treating RN: Ashok Cordia, Debi Primary Care Physician: Loney Laurence Other Clinician: Referring Physician: Loney Laurence Treating Physician/Extender: Rudene Re in Treatment: 71 Verbal / Phone Orders: Yes Clinician: Ashok Cordia, Debi Read Back and Verified: Yes Diagnosis Coding Wound Cleansing Wound #1 Left Calcaneous o Clean wound with Normal Saline. o May Shower, gently pat wound dry prior to applying new dressing. Anesthetic Wound #1 Left Calcaneous o Topical Lidocaine 4% cream applied to wound bed prior to debridement Skin Barriers/Peri-Wound Care Wound #1 Left Calcaneous o Skin Prep Primary Wound Dressing Wound #1 Left Calcaneous o Aquacel Ag Secondary Dressing Wound #1 Left Calcaneous o Dry Gauze - to top of foot o Conform/Kerlix - tape o Boardered Foam Dressing Dressing Change Frequency Wound #1 Left Calcaneous o Change dressing every other day. - for home health purposes Follow-up Appointments Wound #1 Left Calcaneous o Return Appointment in 1 week. Off-Loading Wound #1 Left Calcaneous o Heel suspension boot to: - sage boots PEG ASSIST SHOE Mcghee, Olesya  ([id number]) o Turn and reposition every 2 hours o Other: - FLOAT HEELS WHEN LYING IN BED Home Health Wound #1 Left Calcaneous o Continue Home Health Visits - [facility] *****PLEASE CHANGE YOUR DAY TO MONDAYS******* PT IS BEING SEEN IN THE CLINIC ON THURSDAYS NOW******* o Home Health Nurse may visit PRN to address patientos wound care needs. o FACE TO FACE ENCOUNTER: MEDICARE and MEDICAID PATIENTS: I certify that this patient is under my care and that I had a face-to-face encounter that meets the physician face-to-face encounter  requirements with this patient on this date. The encounter with the patient was in whole or in part for the following MEDICAL CONDITION: (primary reason for Home Healthcare) MEDICAL NECESSITY: I certify, that based on my findings, NURSING services are a medically necessary home health service. HOME BOUND STATUS: I certify that my clinical findings support that this patient is homebound (i.e., Due to illness or injury, pt requires aid of supportive devices such as crutches, cane, wheelchairs, walkers, the use of special transportation or the assistance of another person to leave their place of residence. There is a normal inability to leave the home and doing so requires considerable and taxing effort. Other absences are for medical reasons / religious services and are infrequent or of short duration when for other reasons). o If current dressing causes regression in wound condition, may D/C ordered dressing product/s and apply Normal Saline Moist Dressing daily until next Wound Healing Center / Other MD appointment. Notify Wound Healing Center of regression in wound condition at [phone]. o Please direct any NON-WOUND related issues/requests for orders to patient's Primary Care Physician Electronic Signature(s) Signed: 08/15/2015 11:34:38 AM By: Evlyn Kanner MD, FACS Signed: 08/19/2015 4:14:54 PM By: Alejandro Mulling Entered By: Alejandro Mulling on  08/15/2015 09:02:06 Spindel, [name] ([id number]) -------------------------------------------------------------------------------- Problem List Details Patient Name: [name] Date of Service: 08/15/2015 8:45 AM Medical Record Number: [id number] Patient Account Number: [id number] Date of Birth/Sex: 1927/10/07 (82 y.o. 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 08/08/2015 Yes of other part of foot Inactive Problems Resolved Problems ICD-10 Code Description Active Date Resolved Date L89.521 Pressure ulcer of left ankle, stage 1 02/27/2015 02/27/2015 L03.116 Cellulitis of left lower limb 04/11/2015 04/11/2015 Electronic Signature(s) Signed: 08/15/2015 9:04:09 AM By: Evlyn Kanner MD, FACS Entered By: Evlyn Kanner on 08/15/2015 09:04:08 JAELINE, WHOBREY ([id number]) Pavek, [name] ([id number]) -------------------------------------------------------------------------------- Progress Note Details Patient Name: [name] Date of Service: 08/15/2015 8:45 AM Medical Record Number: [id number] Patient Account Number: [id number] Date of Birth/Sex: 05/11/1927 (82 y.o. Female) Treating RN: Ashok Cordia, Debi Primary Care Physician: Loney Laurence Other Clinician: Referring Physician: Loney Laurence Treating Physician/Extender: Rudene Re in Treatment: 24 Subjective Chief  Complaint Information obtained from Patient patient is back to see [place] today for diabetic foot ulcer which she's had for several months. History of Present Illness (HPI) The following HPI elements were documented for the patient's wound: Location: left heel and dorsum of the foot Quality: Patient reports No Pain. Severity: Patient states wound (s) are getting better. Duration: the patient has had surgery in early December and had vascular testing ordered but not done. Timing: Pain in wound is Intermittent (comes and goes Context: The wound appeared gradually over time Modifying Factors: Other treatment(s) tried include: local dressings and offloading. surgery done by Dr. Graciela Husbands for debridement of the left heel wound Associated Signs and Symptoms: Patient reports having difficulty standing for long periods. 82 year old patient with past medical history of Alzheimer's dementia, diabetes mellitus, hypertension and recent history of right hip arthroplasty in August 2016. Her past medical history is significant also for hypothyroidism, hypertension,  urinary retention, dementia. 04/11/2015 -- she has had a lot of pain and left heel and there has been a lot of drainage today with purulent material. He has not been running fever. 04/18/2015 -- last week she was here with a florid infection of her left heel and I had sent her to the hospital for admission. She was admitted between 12/ 2 and 12/ 5 and she underwent a debridement of the left heel abscess with placement of antibiotic beads with vancomycin, by Dr. Linus Galasodd Cline. Her cultures grew gram- negative bacteria, was given vancomycin and Unasyn IV in hospital and she was continued on oral antibiotics and was given documented for 10 days. at the time of discharge Dr. Alberteen Spindleline wanted the dressing to be intact for about a week before it would need changing. 05/29/2014 -- she has not been to see us for the last 6 weeks and from what I understand she has been  in rehabilitation facility. No vascular testing was done as we had requested. She has a diabetic foot ulcer which was graded as a Wagner 3 when she was seen last about a month and a half ago. There is no family member to discuss hyperbaric oxygen therapy and the patient has as Alzheimer's dementia and I do not believe she understands our discussions 06/12/2015 -- a vascular appointment is pending for tomorrow. 06/19/2015 -- the vascular workup revealed that the left lower extremity had a 50-99% stenosis in the left superficial femoral, popliteal and tibioperoneal trunk. 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 Dr. Wyn Quakerew has recommended angiographically and possible stent placement for this critical ischemia. Gloria FateMORDECAI, Kera ([id number]) 07/03/2015 -- he is going to have a stent placement done by Dr. Wyn Quakerew this coming Monday. 07/11/2015 -- she was taken for an aortogram and found to have diffuse disease SFA with a high-grade near occlusive stenosis in the distal SFA just above Hunter's canal. One-vessel runoff being the wound area and to get tibial artery at the proximal anterior tibial artery had a short segment occlusion in the distal anterior tibial artery and a moderate to high-grade disease of about 10-12 cm. she had a percutaneous angioplasty of the distal and middle left anterior tibial artery, proximal left anterior tibial artery and distal SFA and proximal popliteal artery. Objective Constitutional Pulse regular. Respirations normal and unlabored. Afebrile. Vitals Time Taken: 8:41 AM, Height: 64 in, Temperature: 98.3 F, Pulse: 80 bpm, Respiratory Rate: 16 breaths/min, Blood Pressure: 121/54 mmHg. Eyes Nonicteric. Reactive to light. Ears, Nose, Mouth, and Throat Lips, teeth, and gums WNL.Marland Kitchen. Moist mucosa without lesions. Neck supple and nontender. No palpable supraclavicular or cervical adenopathy. Normal sized without goiter. Respiratory WNL. No  retractions.. Cardiovascular Pedal Pulses WNL. No clubbing, cyanosis or edema. Lymphatic No adneopathy. No adenopathy. No adenopathy. Musculoskeletal Adexa without tenderness or enlargement.. Digits and nails w/o clubbing, cyanosis, infection, petechiae, ischemia, or inflammatory conditions.Marland Kitchen. Psychiatric Judgement and insight Intact.. No evidence of depression, anxiety, or agitation.. General Notes: the medial heel has minimal maceration today and the wound once washed out with moist Quinto, Jerome ([id number]) saline and gauze is looking clean. There is brisk bleeding which was controlled with pressure Integumentary (Hair, Skin) No suspicious lesions. No crepitus or fluctuance. No peri-wound warmth or erythema. No masses.. Wound #1 status is Open. Original cause of wound was Pressure Injury. The wound is located on the Left Calcaneous. The wound measures 1.2cm length x 1cm width x 0.2cm depth; 0.942cm^2 area and 0.188cm^3 volume. The wound is  limited to skin breakdown. There is no tunneling or undermining noted. There is a large amount of serosanguineous drainage noted. The wound margin is indistinct and nonvisible. There is large (67-100%) red, pink granulation within the wound bed. There is a small (1-33%) amount of necrotic tissue within the wound bed including Adherent Slough. The periwound skin appearance exhibited: Maceration, Moist, Erythema. The periwound skin appearance did not exhibit: Callus, Crepitus, Excoriation, Fluctuance, Friable, Induration, Localized Edema, Rash, Scarring, Dry/Scaly, Atrophie Blanche, Cyanosis, Ecchymosis, Hemosiderin Staining, Mottled, Pallor, Rubor. The surrounding wound skin color is noted with erythema which is circumferential. Periwound temperature was noted as No Abnormality. The periwound has tenderness on palpation. Assessment Active Problems ICD-10 E11.621 - Type 2 diabetes mellitus with foot ulcer L89.620 - Pressure ulcer of left heel,  unstageable G30.9 - Alzheimer's disease, unspecified L97.422 - Non-pressure chronic ulcer of left heel and midfoot with fat layer exposed I70.245 - Atherosclerosis of native arteries of left leg with ulceration of other part of foot Plan Wound Cleansing: Wound #1 Left Calcaneous: Clean wound with Normal Saline. May Shower, gently pat wound dry prior to applying new dressing. Anesthetic: Wound #1 Left Calcaneous: Topical Lidocaine 4% cream applied to wound bed prior to debridement Skin Barriers/Peri-Wound Care: Wound #1 Left Calcaneous: Skin Prep Primary Wound Dressing: Wound #1 Left Calcaneous: Belle, Laytoya ([id number]) Aquacel Ag Secondary Dressing: Wound #1 Left Calcaneous: Dry Gauze - to top of foot Conform/Kerlix - tape Boardered Foam Dressing Dressing Change Frequency: Wound #1 Left Calcaneous: Change dressing every other day. - for home health purposes Follow-up Appointments: Wound #1 Left Calcaneous: Return Appointment in 1 week. Off-Loading: Wound #1 Left Calcaneous: Heel suspension boot to: - sage boots PEG ASSIST SHOE Turn and reposition every 2 hours Other: - FLOAT HEELS WHEN LYING IN BED Home Health: Wound #1 Left Calcaneous: Continue Home Health Visits - [facility] *****PLEASE CHANGE YOUR DAY TO MONDAYS******* PT IS BEING SEEN IN THE CLINIC ON THURSDAYS NOW******* Home Health Nurse may visit PRN to address patient s wound care needs. FACE TO FACE ENCOUNTER: MEDICARE and MEDICAID PATIENTS: I certify that this patient is under my care and that I had a face-to-face encounter that meets the physician face-to-face encounter requirements with this patient on this date. The encounter with the patient was in whole or in part for the following MEDICAL CONDITION: (primary reason for Home Healthcare) MEDICAL NECESSITY: I certify, that based on my findings, NURSING services are a medically necessary home health service. HOME BOUND STATUS: I certify that my clinical  findings support that this patient is homebound (i.e., Due to illness or injury, pt requires aid of supportive devices such as crutches, cane, wheelchairs, walkers, the use of special transportation or the assistance of another person to leave their place of residence. There is a normal inability to leave the home and doing so requires considerable and taxing effort. Other absences are for medical reasons / religious services and are infrequent or of short duration when for other reasons). If current dressing causes regression in wound condition, may D/C ordered dressing product/s and apply Normal Saline Moist Dressing daily until next Wound Healing Center / Other MD appointment. Notify Wound Healing Center of regression in wound condition at [phone]. Please direct any NON-WOUND related issues/requests for orders to patient's Primary Care Physician I have recommended Aquacel Ag to the left heel to be changed daily and on the left ankle on the dorsum to apply a bordered foam. She is off loading well and continues to have  good nutrition and vitamin supplements. She will come back and see as next week. EPPIE, BARHORST ([id number]) Electronic Signature(s) Signed: 08/15/2015 9:08:50 AM By: Evlyn Kanner MD, FACS Entered By: Evlyn Kanner on 08/15/2015 09:08:49 [name] ([id number]) -------------------------------------------------------------------------------- SuperBill Details Patient Name: [name] Date of Service: 08/15/2015 Medical Record Number: [id number] Patient Account Number: [id number] Date of Birth/Sex: 08-13-1927 (82 y.o. 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 of other part of foot Facility Procedures CPT4 Code: 08657846 Description: 99213 - WOUND CARE VISIT-LEV 3 EST PT Modifier: Quantity: 1 Physician Procedures CPT4 Code: [id number] Description: 99213 - WC PHYS LEVEL 3 - EST PT ICD-10 Description Diagnosis E11.621 Type 2 diabetes mellitus with foot ulcer L89.620 Pressure ulcer of left heel, unstageable G30.9 Alzheimer's disease, unspecified Modifier: Quantity: 1 Electronic Signature(s) Signed: 08/15/2015 4:30:51 PM By: Evlyn Kanner MD, FACS Signed: 08/19/2015 4:14:54 PM By: Alejandro Mulling Previous Signature: 08/15/2015 9:09:26 AM Version By: Evlyn Kanner MD, FACS Entered By: Alejandro Mulling on 08/15/2015 11:47:14

## 2015-08-25 ENCOUNTER — Encounter (HOSPITAL_BASED_OUTPATIENT_CLINIC_OR_DEPARTMENT_OTHER): Payer: Medicare Other | Admitting: General Surgery

## 2015-08-25 DIAGNOSIS — L8990 Pressure ulcer of unspecified site, unspecified stage: Secondary | ICD-10-CM

## 2015-08-25 DIAGNOSIS — E11621 Type 2 diabetes mellitus with foot ulcer: Secondary | ICD-10-CM | POA: Diagnosis not present

## 2015-08-25 NOTE — Progress Notes (Signed)
Pressure ulcer left heel.  See I heal note

## 2015-08-26 NOTE — Progress Notes (Signed)
HENRITTA, MUTZ ([id number]) Visit Report for 08/25/2015 Arrival Information Details Patient Name: [name], [name] Date of Service: 08/25/2015 8:00 AM Medical Record Number: [id number] Patient Account Number: [id number] Date of Birth/Sex: [birth year] (82 y.o. Female) Treating RN: Ashok Cordia, Debi Primary Care Physician: Loney Laurence Other Clinician: Referring Physician: Loney Laurence Treating Physician/Extender: Rudene Re in Treatment: 25 Visit Information History Since Last Visit All ordered tests and consults were completed: No Patient Arrived: Ambulatory Added or deleted any medications: No Arrival Time: 08:04 Any new allergies or adverse reactions: No Accompanied By: caregiver Had a fall or experienced change in No Transfer Assistance: None activities of daily living that may affect Patient Identification Verified: Yes risk of falls: Secondary Verification Process Yes Signs or symptoms of abuse/neglect since last No Completed: visito Patient Requires Transmission- No Hospitalized since last visit: No Based Precautions: Pain Present Now: No Patient Has Alerts: Yes Patient Alerts: Patient on Blood Thinner ABI: Non Compressible Electronic Signature(s) Signed: 08/25/2015 5:04:00 PM By: Alejandro Mulling Entered By: Alejandro Mulling on 08/25/2015 08:05:00 [name] ([id number]) -------------------------------------------------------------------------------- Encounter Discharge Information Details Patient Name: [name] Date of Service: 08/25/2015 8:00 AM Medical Record Number: [id number] Patient Account Number: [id number] Date of Birth/Sex: 07-08-1927 (82 y.o. Female) Treating RN: Ashok Cordia, Debi Primary Care Physician: Loney Laurence Other Clinician: Referring Physician: Loney Laurence Treating Physician/Extender: Elayne Snare in Treatment: 25 Encounter Discharge Information Items Discharge Pain Level: 0 Discharge  Condition: Stable Ambulatory Status: Wheelchair Discharge Destination: Nursing Home Transportation: Private Auto Accompanied By: caregiver Schedule Follow-up Appointment: Yes Medication Reconciliation completed and provided to Patient/Care Yes Nikos Anglemyer: Provided on Clinical Summary of Care: 08/25/2015 Form Type Recipient Paper Patient BM Electronic Signature(s) Signed: 08/25/2015 8:36:14 AM By: Ardath Sax MD Previous Signature: 08/25/2015 8:32:13 AM Version By: Gwenlyn Perking Entered By: Ardath Sax on 08/25/2015 08:36:14 [name], [name] ([id number]) -------------------------------------------------------------------------------- Lower Extremity Assessment Details Patient Name: [name] Date of Service: 08/25/2015 8:00 AM Medical Record Number: [id number] Patient Account Number: [id number] Date of Birth/Sex: 1927-06-22 (82 y.o. Female) Treating RN: Ashok Cordia, Debi Primary Care Physician: Loney Laurence Other Clinician: Referring Physician: Loney Laurence Treating Physician/Extender: Rudene Re in Treatment: 25 Vascular Assessment Pulses: Posterior Tibial Dorsalis Pedis Palpable: [Left:Yes] Extremity colors, hair growth, and conditions: Extremity Color: [Left:Normal] Temperature of Extremity: [Left:Warm] Capillary Refill: [Left:< 3 seconds] Electronic Signature(s) Signed: 08/25/2015 5:04:00 PM By: Alejandro Mulling Entered By: Alejandro Mulling on 08/25/2015 08:08:01 [name], [name] ([id number]) -------------------------------------------------------------------------------- Multi Wound Chart Details Patient Name: [name] Date of Service: 08/25/2015 8:00 AM Medical Record Number: [id number] Patient Account Number: [id number] Date of Birth/Sex: December 20, 1927 (82 y.o. Female) Treating RN: Ashok Cordia, Debi Primary Care Physician: Loney Laurence Other Clinician: Referring Physician: Loney Laurence Treating Physician/Extender: Elayne Snare in Treatment: 25 Vital Signs Height(in): 64 Pulse(bpm): 86 Weight(lbs): Blood Pressure 161/61 (mmHg): Body Mass Index(BMI): Temperature(F): 97.7 Respiratory Rate 16 (breaths/min): Photos: [1:No Photos] [N/A:N/A] Wound Location: [1:Left Calcaneous] [N/A:N/A] Wounding Event: [1:Pressure Injury] [N/A:N/A] Primary Etiology: [1:Pressure Ulcer] [N/A:N/A] Comorbid History: [1:Anemia, Hypertension, Type II Diabetes, History of pressure wounds, Osteoarthritis, Dementia] [N/A:N/A] Date Acquired: [1:01/30/2015] [N/A:N/A] Weeks of Treatment: [1:25] [N/A:N/A] Wound Status: [1:Open] [N/A:N/A] Measurements L x W x D 0.7x0.5x0.1 [N/A:N/A] (cm) Area (cm) : [1:0.275] [N/A:N/A] Volume (cm) : [1:0.027] [N/A:N/A] % Reduction in Area: [1:97.10%] [N/A:N/A] % Reduction in Volume: 97.20% [N/A:N/A] Classification: [1:Unstageable/Unclassified] [N/A:N/A] HBO Classification: [1:Grade 1] [N/A:N/A] Exudate Amount: [1:Large] [N/A:N/A] Exudate Type: [1:Serosanguineous] [N/A:N/A] Exudate Color: [1:red, brown] [N/A:N/A] Wound Margin: [1:Indistinct, nonvisible] [N/A:N/A] Granulation Amount: [1:Medium (34-66%)] [N/A:N/A] Granulation Quality: [1:Red, Pink] [N/A:N/A] Necrotic  Amount: [1:Medium (34-66%)] [N/A:N/A] Exposed Structures: [1:Fascia: No Fat: No Tendon: No Muscle: No] [N/A:N/A] Joint: No Bone: No Limited to Skin Breakdown Epithelialization: Small (1-33%) N/A N/A Periwound Skin Texture: Edema: No N/A N/A Excoriation: No Induration: No Callus: No Crepitus: No Fluctuance: No Friable: No Rash: No Scarring: No Periwound Skin Maceration: Yes N/A N/A Moisture: Moist: Yes Dry/Scaly: No Periwound Skin Color: Erythema: Yes N/A N/A Atrophie Blanche: No Cyanosis: No Ecchymosis: No Hemosiderin Staining: No Mottled: No Pallor: No Rubor: No Erythema Location: Circumferential N/A N/A Temperature: No Abnormality N/A N/A Tenderness on Yes N/A N/A Palpation: Wound  Preparation: Ulcer Cleansing: N/A N/A Rinsed/Irrigated with Saline Topical Anesthetic Applied: Other: lidocaine 4% Treatment Notes Electronic Signature(s) Signed: 08/25/2015 5:04:00 PM By: Alejandro Mulling Entered By: Alejandro Mulling on 08/25/2015 08:13:15 Rossa, [name] ([id number]) -------------------------------------------------------------------------------- Multi-Disciplinary Care Plan Details Patient Name: [name] Date of Service: 08/25/2015 8:00 AM Medical Record Number: [id number] Patient Account Number: [id number] Date of Birth/Sex: 09-Jul-1927 (82 y.o. Female) Treating RN: Ashok Cordia, Debi Primary Care Physician: Loney Laurence Other Clinician: Referring Physician: Loney Laurence Treating Physician/Extender: Elayne Snare in Treatment: 25 Active Inactive Orientation to the Wound Care Program Nursing Diagnoses: Knowledge deficit related to the wound healing center program Goals: Patient/caregiver will verbalize understanding of the Wound Healing Center Program Date Initiated: 02/27/2015 Goal Status: Active Interventions: Provide education on orientation to the wound center Notes: Pressure Nursing Diagnoses: Knowledge deficit related to management of pressures ulcers Potential for impaired tissue integrity related to pressure, friction, moisture, and shear Goals: Patient will remain free from development of additional pressure ulcers Date Initiated: 02/27/2015 Goal Status: Active Patient will remain free of pressure ulcers Date Initiated: 02/27/2015 Goal Status: Active Patient/caregiver will verbalize risk factors for pressure ulcer development Date Initiated: 02/27/2015 Goal Status: Active Patient/caregiver will verbalize understanding of pressure ulcer management Date Initiated: 02/27/2015 Goal Status: Active Interventions: Assess: immobility, friction, shearing, incontinence upon admission and as needed Frisk, Dany  ([id number]) Assess offloading mechanisms upon admission and as needed Assess potential for pressure ulcer upon admission and as needed Provide education on pressure ulcers Treatment Activities: Patient referred for home evaluation of offloading devices/mattresses : 08/25/2015 Patient referred for pressure reduction/relief devices : 08/25/2015 Notes: Wound/Skin Impairment Nursing Diagnoses: Impaired tissue integrity Knowledge deficit related to ulceration/compromised skin integrity Goals: Patient/caregiver will verbalize understanding of skin care regimen Date Initiated: 02/27/2015 Goal Status: Active Ulcer/skin breakdown will have a volume reduction of 30% by week 4 Date Initiated: 02/27/2015 Goal Status: Active Ulcer/skin breakdown will have a volume reduction of 50% by week 8 Date Initiated: 02/27/2015 Goal Status: Active Ulcer/skin breakdown will have a volume reduction of 80% by week 12 Date Initiated: 02/27/2015 Goal Status: Active Ulcer/skin breakdown will heal within 14 weeks Date Initiated: 02/27/2015 Goal Status: Active Interventions: Assess patient/caregiver ability to obtain necessary supplies Assess patient/caregiver ability to perform ulcer/skin care regimen upon admission and as needed Assess ulceration(s) every visit Provide education on ulcer and skin care Treatment Activities: Skin care regimen initiated : 08/25/2015 Notes: Electronic Signature(s) AUNNA, SNOOKS ([id number]) Signed: 08/25/2015 5:04:00 PM By: Alejandro Mulling Entered By: Alejandro Mulling on 08/25/2015 08:13:06 Murren, Ellin ([id number]) -------------------------------------------------------------------------------- Pain Assessment Details Patient Name: [name] Date of Service: 08/25/2015 8:00 AM Medical Record Number: [id number] Patient Account Number: [id number] Date of Birth/Sex: March 16, 1928 (82 y.o. Female) Treating RN: Ashok Cordia, Debi Primary Care Physician: Loney Laurence Other Clinician: Referring Physician: Loney Laurence Treating Physician/Extender: Rudene Re in Treatment: 25 Active Problems Location of Pain Severity and Description of Pain  Patient Has Paino No Site Locations Pain Management and Medication Current Pain Management: Electronic Signature(s) Signed: 08/25/2015 5:04:00 PM By: Alejandro MullingPinkerton, Debra Entered By: Alejandro MullingPinkerton, Debra on 08/25/2015 08:05:05 Harbach, [name] ([id number]) -------------------------------------------------------------------------------- Patient/Caregiver Education Details Patient Name: Gloria FateMORDECAI, Gloria Date of Service: 08/25/2015 8:00 AM Medical Record Number: [id number] Patient Account Number: [id number] Date of Birth/Gender: 10-Feb-1928 (82 y.o. Female) Treating RN: Ashok CordiaPinkerton, Debi Primary Care Physician: Loney LaurenceAMPBELL, MARGARET Other Clinician: Referring Physician: Loney LaurenceAMPBELL, MARGARET Treating Physician/Extender: Elayne SnarePARKER, PETER Weeks in Treatment: 25 Education Assessment Education Provided To: Patient Education Topics Provided Wound/Skin Impairment: Handouts: Other: change dressing as ordered Methods: Demonstration, Explain/Verbal Responses: State content correctly Electronic Signature(s) Signed: 08/25/2015 8:36:21 AM By: Ardath SaxParker, Peter MD Entered By: Ardath SaxParker, Peter on 08/25/2015 08:36:20 Bochicchio, [name] ([id number]) -------------------------------------------------------------------------------- Wound Assessment Details Patient Name: Gloria FateMORDECAI, Gloria Date of Service: 08/25/2015 8:00 AM Medical Record Number: [id number] Patient Account Number: [id number] Date of Birth/Sex: 10-Feb-1928 (82 y.o. Female) Treating RN: Ashok CordiaPinkerton, Debi Primary Care Physician: Loney LaurenceAMPBELL, MARGARET Other Clinician: Referring Physician: Loney LaurenceAMPBELL, MARGARET Treating Physician/Extender: Rudene ReBritto, Errol Weeks in Treatment: 25 Wound Status Wound Number: 1 Primary Pressure Ulcer Etiology: Wound Location: Left  Calcaneous Wound Open Wounding Event: Pressure Injury Status: Date Acquired: 01/30/2015 Comorbid Anemia, Hypertension, Type II Weeks Of Treatment: 25 History: Diabetes, History of pressure wounds, Clustered Wound: No Osteoarthritis, Dementia Photos Photo Uploaded By: Alejandro MullingPinkerton, Debra on 08/25/2015 11:25:44 Wound Measurements Length: (cm) 0.7 Width: (cm) 0.5 Depth: (cm) 0.1 Area: (cm) 0.275 Volume: (cm) 0.027 % Reduction in Area: 97.1% % Reduction in Volume: 97.2% Epithelialization: Small (1-33%) Tunneling: No Undermining: No Wound Description Classification: Unstageable/Unclassified Foul Od Diabetic Severity (Wagner): Grade 1 Wound Margin: Indistinct, nonvisible Exudate Amount: Large Exudate Type: Serosanguineous Exudate Color: red, brown or After Cleansing: No Wound Bed Granulation Amount: Medium (34-66%) Exposed Structure Granulation Quality: Red, Pink Fascia Exposed: No Necrotic Amount: Medium (34-66%) Fat Layer Exposed: No Prisco, [name] ([id number]) Necrotic Quality: Adherent Slough Tendon Exposed: No Muscle Exposed: No Joint Exposed: No Bone Exposed: No Limited to Skin Breakdown Periwound Skin Texture Texture Color No Abnormalities Noted: No No Abnormalities Noted: No Callus: No Atrophie Blanche: No Crepitus: No Cyanosis: No Excoriation: No Ecchymosis: No Fluctuance: No Erythema: Yes Friable: No Erythema Location: Circumferential Induration: No Hemosiderin Staining: No Localized Edema: No Mottled: No Rash: No Pallor: No Scarring: No Rubor: No Moisture Temperature / Pain No Abnormalities Noted: No Temperature: No Abnormality Dry / Scaly: No Tenderness on Palpation: Yes Maceration: Yes Moist: Yes Wound Preparation Ulcer Cleansing: Rinsed/Irrigated with Saline Topical Anesthetic Applied: Other: lidocaine 4%, Treatment Notes Wound #1 (Left Calcaneous) 1. Cleansed with: Clean wound with Normal Saline Cleanse wound with  antibacterial soap and water 2. Anesthetic Topical Lidocaine 4% cream to wound bed prior to debridement 3. Peri-wound Care: Barrier cream 4. Dressing Applied: Prisma Ag 5. Secondary Dressing Applied Bordered Foam Dressing Dry Gauze Gauze and Kerlix/Conform 7. Secured with [employer] Electronic Signature(s) Gloria FateMORDECAI, [name] ([id number]) Signed: 08/25/2015 5:04:00 PM By: Alejandro MullingPinkerton, Debra Entered By: Alejandro MullingPinkerton, Debra on 08/25/2015 08:12:27 Gloria FateMORDECAI, Gloria ([id number]) -------------------------------------------------------------------------------- Vitals Details Patient Name: Gloria FateMORDECAI, Gloria Date of Service: 08/25/2015 8:00 AM Medical Record Number: [id number] Patient Account Number: [id number] Date of Birth/Sex: 10-Feb-1928 (82 y.o. Female) Treating RN: Ashok CordiaPinkerton, Debi Primary Care Physician: Loney LaurenceAMPBELL, MARGARET Other Clinician: Referring Physician: Loney LaurenceAMPBELL, MARGARET Treating Physician/Extender: Rudene ReBritto, Errol Weeks in Treatment: 25 Vital Signs Time Taken: 08:05 Temperature (F): 97.7 Height (in): 64 Pulse (bpm): 86 Respiratory Rate (breaths/min): 16 Blood Pressure (mmHg): 161/61 Reference Range: 80 - 120 mg / dl Electronic Signature(s) Signed: 08/25/2015 5:04:00 PM  By: Alejandro Mulling Entered By: Alejandro Mulling on 08/25/2015 08:06:55

## 2015-08-26 NOTE — Progress Notes (Signed)
NYANNA, HEIDEMAN ([id number]) Visit Report for 08/25/2015 Chief Complaint Document Details Patient Name: Gloria, [name] Date of Service: 08/25/2015 8:00 AM Medical Record Number: [id number] Patient Account Number: [id number] Date of Birth/Sex: November 18, 1927 (82 y.o. Female) Treating RN: Ashok Cordia, Debi Primary Care Physician: Loney Laurence Other Clinician: Referring Physician: Loney Laurence Treating Physician/Extender: Elayne Snare in Treatment: 25 Information Obtained from: Patient Chief Complaint patient is back to see [place] today for diabetic foot ulcer which she's had for several months. Electronic Signature(s) Signed: 08/25/2015 8:33:20 AM By: Ardath Sax MD Entered By: Ardath Sax on 08/25/2015 08:33:20 [name], [name] ([id number]) -------------------------------------------------------------------------------- Debridement Details Patient Name: [name] Date of Service: 08/25/2015 8:00 AM Medical Record Number: [id number] Patient Account Number: [id number] Date of Birth/Sex: [birth year] (82 y.o. Female) Treating RN: Ashok Cordia, Debi Primary Care Physician: Loney Laurence Other Clinician: Referring Physician: Loney Laurence Treating Physician/Extender: Elayne Snare in Treatment: 25 Debridement Performed for Wound #1 Left Calcaneous Assessment: Performed By: Physician Ardath Sax, MD Debridement: Debridement Pre-procedure Yes Verification/Time Out Taken: Start Time: 08:16 Pain Control: Other : lidocaine 4% cream Level: Skin/Subcutaneous Tissue Total Area Debrided (L x 0.7 (cm) x 0.5 (cm) = 0.35 (cm) W): Tissue and other Viable, Non-Viable, Exudate, Fibrin/Slough, Subcutaneous material debrided: Instrument: Scissors Bleeding: Minimum Hemostasis Achieved: Pressure End Time: 08:17 Procedural Pain: 0 Post Procedural Pain: 0 Response to Treatment: Procedure was tolerated well Post Debridement Measurements of Total  Wound Length: (cm) 0.7 Stage: Unstageable/Unclassified Width: (cm) 0.5 Depth: (cm) 0.2 Volume: (cm) 0.055 Post Procedure Diagnosis Same as Pre-procedure Electronic Signature(s) Signed: 08/25/2015 3:38:49 PM By: Ardath Sax MD Signed: 08/25/2015 5:04:00 PM By: Alejandro Mulling Entered By: Alejandro Mulling on 08/25/2015 08:17:52 [name], [name] ([id number]) -------------------------------------------------------------------------------- HPI Details Patient Name: [name] Date of Service: 08/25/2015 8:00 AM Medical Record Number: [id number] Patient Account Number: [id number] Date of Birth/Sex: 1927-06-25 (82 y.o. Female) Treating RN: Ashok Cordia, Debi Primary Care Physician: Loney Laurence Other Clinician: Referring Physician: Loney Laurence Treating Physician/Extender: Elayne Snare in Treatment: 25 History of Present Illness Location: left heel and dorsum of the foot Quality: Patient reports No Pain. Severity: Patient states wound (s) are getting better. Duration: the patient has had surgery in early December and had vascular testing ordered but not done. Timing: Pain in wound is Intermittent (comes and goes Context: The wound appeared gradually over time Modifying Factors: Other treatment(s) tried include: local dressings and offloading. surgery done by Dr. Graciela Husbands for debridement of the left heel wound Associated Signs and Symptoms: Patient reports having difficulty standing for long periods. HPI Description: 82 year old patient with past medical history of Alzheimer's dementia, diabetes mellitus, hypertension and recent history of right hip arthroplasty in August 2016. Her past medical history is significant also for hypothyroidism, hypertension, urinary retention, dementia. 04/11/2015 -- she has had a lot of pain and left heel and there has been a lot of drainage today with purulent material. [name] has not been running fever. 04/18/2015 -- last week she was  here with a florid infection of her left heel and I had sent her to the hospital for admission. She was admitted between 12/ 2 and 12/ 5 and she underwent a debridement of the left heel abscess with placement of antibiotic beads with vancomycin, by Dr. Linus [name]. Her cultures grew gram- negative bacteria, was given vancomycin and Unasyn IV in hospital and she was continued on oral antibiotics and was given documented for 10 days. at the time of discharge Dr. Alberteen Spindle wanted the dressing to be intact for about a week  before it would need changing. 05/29/2014 -- she has not been to see [place] for the last 6 weeks and from what I understand she has been in rehabilitation facility. No vascular testing was done as we had requested. She has a diabetic foot ulcer which was graded as a Wagner 3 when she was seen last about a month and a half ago. There is no family member to discuss hyperbaric oxygen therapy and the patient has as Alzheimer's dementia and I do not believe she understands our discussions 06/12/2015 -- a vascular appointment is pending for tomorrow. 06/19/2015 -- the vascular workup revealed that the left lower extremity had a 50-99% stenosis in the left superficial femoral, popliteal and tibioperoneal trunk. 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 Dr. Wyn Quaker has recommended angiographically and possible stent placement for this critical ischemia. 07/03/2015 -- [name] is going to have a stent placement done by Dr. Wyn Quaker this coming Monday. 07/11/2015 -- she was taken for an aortogram and found to have diffuse disease SFA with a high-grade near occlusive stenosis in the distal SFA just above Hunter's canal. One-vessel runoff being the wound area and to get tibial artery at the proximal anterior tibial artery had a short segment occlusion in the distal anterior tibial artery and a moderate to high-grade disease of about 10-12 cm. she had a percutaneous angioplasty of the distal and  middle left anterior tibial artery, proximal left anterior tibial artery and distal SFA and proximal popliteal artery. Gloria, [name] ([id number]) Electronic Signature(s) Signed: 08/25/2015 8:33:41 AM By: Ardath Sax MD Entered By: Ardath Sax on 08/25/2015 08:33:41 Gloria, [name] ([id number]) -------------------------------------------------------------------------------- Physical Exam Details Patient Name: [name] Date of Service: 08/25/2015 8:00 AM Medical Record Number: [id number] Patient Account Number: [id number] Date of Birth/Sex: 06/05/1927 (82 y.o. Female) Treating RN: [name] Haggis Primary Care Physician: Loney Laurence Other Clinician: Referring Physician: Loney Laurence Treating Physician/Extender: Elayne Snare in Treatment: 25 Electronic Signature(s) Signed: 08/25/2015 8:33:46 AM By: Ardath Sax MD Entered By: Ardath Sax on 08/25/2015 08:33:46 [place]anger, [name] ([id number]) -------------------------------------------------------------------------------- Physician Orders Details Patient Name: [name] Date of Service: 08/25/2015 8:00 AM Medical Record Number: [id number] Patient Account Number: [id number] Date of Birth/Sex: [birth year] (82 y.o. Female) Treating RN: Ashok Cordia, Debi Primary Care Physician: Loney Laurence Other Clinician: Referring Physician: Loney Laurence Treating Physician/Extender: Elayne Snare in Treatment: 25 Verbal / Phone Orders: Yes ClinicianAshok Cordia, Debi Read Back and Verified: Yes Diagnosis Coding Wound Cleansing Wound #1 Left Calcaneous o Clean wound with Normal Saline. o Cleanse wound with mild soap and water o May Shower, gently pat wound dry prior to applying new dressing. Anesthetic Wound #1 Left Calcaneous o Topical Lidocaine 4% cream applied to wound bed prior to debridement Skin Barriers/Peri-Wound Care Wound #1 Left Calcaneous o Barrier cream Primary Wound  Dressing Wound #1 Left Calcaneous o Prisma Ag - moisten with saline Secondary Dressing Wound #1 Left Calcaneous o Dry Gauze - to top of foot, and to heel o Conform/Kerlix - tape o Boardered Foam Dressing Dressing Change Frequency Wound #1 Left Calcaneous o Change dressing every other day. - for home health purposes Follow-up Appointments Wound #1 Left Calcaneous o Return Appointment in 1 week. Off-Loading Wound #1 Left Calcaneous Cruzan, Mosetta ([id number]) o Heel suspension boot to: - sage boots PEG ASSIST SHOE o Turn and reposition every 2 hours o Other: - FLOAT HEELS WHEN LYING IN BED Home Health Wound #1 Left Calcaneous o Continue Home Health Visits - [facility] *****PLEASE CHANGE YOUR  DAY TO MONDAYS******* PT IS BEING SEEN IN THE CLINIC ON THURSDAYS NOW******* o Home Health Nurse may visit PRN to address patientos wound care needs. o FACE TO FACE ENCOUNTER: MEDICARE and MEDICAID PATIENTS: I certify that this patient is under my care and that I had a face-to-face encounter that meets the physician face-to-face encounter requirements with this patient on this date. The encounter with the patient was in whole or in part for the following MEDICAL CONDITION: (primary reason for Home Healthcare) MEDICAL NECESSITY: I certify, that based on my findings, NURSING services are a medically necessary home health service. HOME BOUND STATUS: I certify that my clinical findings support that this patient is homebound (i.e., Due to illness or injury, pt requires aid of supportive devices such as crutches, cane, wheelchairs, walkers, the use of special transportation or the assistance of another person to leave their place of residence. There is a normal inability to leave the home and doing so requires considerable and taxing effort. Other absences are for medical reasons / religious services and are infrequent or of short duration when for other reasons). o If  current dressing causes regression in wound condition, may D/C ordered dressing product/s and apply Normal Saline Moist Dressing daily until next Wound Healing Center / Other MD appointment. Notify Wound Healing Center of regression in wound condition at [phone]. o Please direct any NON-WOUND related issues/requests for orders to patient's Primary Care Physician Electronic Signature(s) Signed: 08/25/2015 3:38:49 PM By: Ardath SaxParker, Corin Tilly MD Signed: 08/25/2015 5:04:00 PM By: Alejandro MullingPinkerton, Debra Entered By: Alejandro MullingPinkerton, Debra on 08/25/2015 08:34:54 [name], [name] ([id number]) -------------------------------------------------------------------------------- Problem List Details Patient Name: Gloria FateMORDECAI, Anahis Date of Service: 08/25/2015 8:00 AM Medical Record Number: [id number] Patient Account Number: [id number] Date of Birth/Sex: 28-Jun-1927 (82 y.o. 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 05/30/2015 Yes 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 08/08/2015 Yes of other part of foot Inactive Problems Resolved Problems ICD-10 Code Description Active Date Resolved Date L89.521 Pressure ulcer of left ankle, stage 1 02/27/2015 02/27/2015 L03.116 Cellulitis of left lower limb 04/11/2015 04/11/2015 Electronic Signature(s) Signed: 08/25/2015 8:33:11 AM By: Ardath SaxParker, Heiley Shaikh MD Entered By: Ardath SaxParker, Laurence Folz on 08/25/2015 08:33:11 [name], [name] ([id number]) [name], [name]  ([id number]) -------------------------------------------------------------------------------- Progress Note Details Patient Name: Gloria FateMORDECAI, Estefanny Date of Service: 08/25/2015 8:00 AM Medical Record Number: [id number] Patient Account Number: [id number] Date of Birth/Sex: 28-Jun-1927 (82 y.o. Female) Treating RN: Ashok CordiaPinkerton, Debi Primary Care Physician: Loney LaurenceAMPBELL, MARGARET Other Clinician: Referring Physician: Loney LaurenceAMPBELL, MARGARET Treating Physician/Extender: Elayne SnarePARKER, Arhum Peeples Weeks in Treatment: 25 Subjective Chief Complaint Information obtained from Patient patient is back to see us today for diabetic foot ulcer which she's had for several months. History of Present Illness (HPI) The following HPI elements were documented for the patient's wound: Location: left heel and dorsum of the foot Quality: Patient reports No Pain. Severity: Patient states wound (s) are getting better. Duration: the patient has had surgery in early December and had vascular testing ordered but not done. Timing: Pain in wound is Intermittent (comes and goes Context: The wound appeared gradually over time Modifying Factors: Other treatment(s) tried include: local dressings and offloading. surgery  done by Dr. Graciela Husbands for debridement of the left heel wound Associated Signs and Symptoms: Patient reports having difficulty standing for long periods. 82 year old patient with past medical history of Alzheimer's dementia, diabetes mellitus, hypertension and recent history of right hip arthroplasty in August 2016. Her past medical history is significant also for hypothyroidism, hypertension, urinary retention, dementia. 04/11/2015 -- she has had a lot of pain and left heel and there has been a lot of drainage today with purulent material. [name] has not been running fever. 04/18/2015 -- last week she was here with a florid infection of her left heel and I had sent her to the hospital for admission. She was admitted between 12/ 2 and 12/  5 and she underwent a debridement of the left heel abscess with placement of antibiotic beads with vancomycin, by Dr. Linus [name]. Her cultures grew gram- negative bacteria, was given vancomycin and Unasyn IV in hospital and she was continued on oral antibiotics and was given documented for 10 days. at the time of discharge Dr. Alberteen Spindle wanted the dressing to be intact for about a week before it would need changing. 05/29/2014 -- she has not been to see [place] for the last 6 weeks and from what I understand she has been in rehabilitation facility. No vascular testing was done as we had requested. She has a diabetic foot ulcer which was graded as a Wagner 3 when she was seen last about a month and a half ago. There is no family member to discuss hyperbaric oxygen therapy and the patient has as Alzheimer's dementia and I do not believe she understands our discussions 06/12/2015 -- a vascular appointment is pending for tomorrow. 06/19/2015 -- the vascular workup revealed that the left lower extremity had a 50-99% stenosis in the left superficial femoral, popliteal and tibioperoneal trunk. 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 Dr. Wyn Quaker has recommended angiographically and possible stent placement for this critical ischemia. FRANCENIA, CHIMENTI ([id number]) 07/03/2015 -- [name] is going to have a stent placement done by Dr. Wyn Quaker this coming Monday. 07/11/2015 -- she was taken for an aortogram and found to have diffuse disease SFA with a high-grade near occlusive stenosis in the distal SFA just above Hunter's canal. One-vessel runoff being the wound area and to get tibial artery at the proximal anterior tibial artery had a short segment occlusion in the distal anterior tibial artery and a moderate to high-grade disease of about 10-12 cm. she had a percutaneous angioplasty of the distal and middle left anterior tibial artery, proximal left anterior tibial artery and distal SFA and proximal  popliteal artery. Objective Constitutional Vitals Time Taken: 8:05 AM, Height: 64 in, Temperature: 97.7 F, Pulse: 86 bpm, Respiratory Rate: 16 breaths/min, Blood Pressure: 161/61 mmHg. Integumentary (Hair, Skin) Wound #1 status is Open. Original cause of wound was Pressure Injury. The wound is located on the Left Calcaneous. The wound measures 0.7cm length x 0.5cm width x 0.1cm depth; 0.275cm^2 area and 0.027cm^3 volume. The wound is limited to skin breakdown. There is no tunneling or undermining noted. There is a large amount of serosanguineous drainage noted. The wound margin is indistinct and nonvisible. There is medium (34-66%) red, pink granulation within the wound bed. There is a medium (34-66%) amount of necrotic tissue within the wound bed including Adherent Slough. The periwound skin appearance exhibited: Maceration, Moist, Erythema. The periwound skin appearance did not exhibit: Callus, Crepitus, Excoriation, Fluctuance, Friable, Induration, Localized Edema, Rash, Scarring, Dry/Scaly, Atrophie Blanche, Cyanosis, Ecchymosis,  Hemosiderin Staining, Mottled, Pallor, Rubor. The surrounding wound skin color is noted with erythema which is circumferential. Periwound temperature was noted as No Abnormality. The periwound has tenderness on palpation. Assessment Active Problems ICD-10 E11.621 - Type 2 diabetes mellitus with foot ulcer L89.620 - Pressure ulcer of left heel, unstageable G30.9 - Alzheimer's disease, unspecified L97.422 - Non-pressure chronic ulcer of left heel and midfoot with fat layer exposed I70.245 - Atherosclerosis of native arteries of left leg with ulceration of other part of foot [name], [name] ([id number]) Procedures Wound #1 Wound #1 is a Pressure Ulcer located on the Left Calcaneous . There was a Skin/Subcutaneous Tissue Debridement ([id number]) debridement with total area of 0.35 sq cm performed by Ardath Sax, MD. with the following instrument(s):  Scissors to remove Viable and Non-Viable tissue/material including Exudate, Fibrin/Slough, and Subcutaneous after achieving pain control using Other (lidocaine 4% cream). A time out was conducted prior to the start of the procedure. A Minimum amount of bleeding was controlled with Pressure. The procedure was tolerated well with a pain level of 0 throughout and a pain level of 0 following the procedure. Post Debridement Measurements: 0.7cm length x 0.5cm width x 0.2cm depth; 0.055cm^3 volume. Post debridement Stage noted as Unstageable/Unclassified. Post procedure Diagnosis Wound #1: Same as Pre-Procedure Plan Wound Cleansing: Wound #1 Left Calcaneous: Clean wound with Normal Saline. Cleanse wound with mild soap and water May Shower, gently pat wound dry prior to applying new dressing. Anesthetic: Wound #1 Left Calcaneous: Topical Lidocaine 4% cream applied to wound bed prior to debridement Skin Barriers/Peri-Wound Care: Wound #1 Left Calcaneous: Barrier cream Primary Wound Dressing: Wound #1 Left Calcaneous: Prisma Ag - moisten with saline Secondary Dressing: Wound #1 Left Calcaneous: Dry Gauze - to top of foot, and to heel Conform/Kerlix - tape Boardered Foam Dressing Dressing Change Frequency: Wound #1 Left Calcaneous: Change dressing every other day. - for home health purposes Follow-up Appointments: Wound #1 Left Calcaneous: Return Appointment in 1 week. Off-Loading: Wound #1 Left Calcaneous: [name], [name] ([id number]) Heel suspension boot to: - sage boots PEG ASSIST SHOE Turn and reposition every 2 hours Other: - FLOAT HEELS WHEN LYING IN BED Home Health: Wound #1 Left Calcaneous: Continue Home Health Visits - [facility] *****PLEASE CHANGE YOUR DAY TO MONDAYS******* PT IS BEING SEEN IN THE CLINIC ON THURSDAYS NOW******* Home Health Nurse may visit PRN to address patient s wound care needs. FACE TO FACE ENCOUNTER: MEDICARE and MEDICAID PATIENTS: I certify that this  patient is under my care and that I had a face-to-face encounter that meets the physician face-to-face encounter requirements with this patient on this date. The encounter with the patient was in whole or in part for the following MEDICAL CONDITION: (primary reason for Home Healthcare) MEDICAL NECESSITY: I certify, that based on my findings, NURSING services are a medically necessary home health service. HOME BOUND STATUS: I certify that my clinical findings support that this patient is homebound (i.e., Due to illness or injury, pt requires aid of supportive devices such as crutches, cane, wheelchairs, walkers, the use of special transportation or the assistance of another person to leave their place of residence. There is a normal inability to leave the home and doing so requires considerable and taxing effort. Other absences are for medical reasons / religious services and are infrequent or of short duration when for other reasons). If current dressing causes regression in wound condition, may D/C ordered dressing product/s and apply Normal Saline Moist Dressing daily until next Wound Healing Center /  Other MD appointment. Notify Wound Healing Center of regression in wound condition at [phone]. Please direct any NON-WOUND related issues/requests for orders to patient's Primary Care Physician Follow-Up Appointments: A follow-up appointment should be scheduled. Medication Reconciliation completed and provided to Patient/Care Provider. A Patient Clinical Summary of Care was provided to [facility] Debrided callous .Marland Kitchen Wound less than 1 cm. Dressed with silver collagen Electronic Signature(s) Signed: 08/25/2015 8:35:03 AM By: Ardath Sax MD Entered By: Ardath Sax on 08/25/2015 08:35:03 [name], [name] ([id number]) -------------------------------------------------------------------------------- SuperBill Details Patient Name: [name] Date of Service: 08/25/2015 Medical Record Number:  [id number] Patient Account Number: [id number] Date of Birth/Sex: 13-Mar-1928 (82 y.o. 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 of other part of foot Facility Procedures CPT4: Description Modifier Quantity Code [id number] 11042 - DEB SUBQ TISSUE 20 SQ CM/< 1 ICD-10 Description Diagnosis I70.245 Atherosclerosis of native arteries of left leg with ulceration of other part of foot Physician Procedures CPT4: Description Modifier Quantity Code [id number] 99213 - WC PHYS LEVEL 3 - EST PT 1 ICD-10 Description Diagnosis L89.620 Pressure ulcer of left heel, unstageable CPT4: 1324401 11042 - WC PHYS SUBQ TISS 20 SQ CM 1 ICD-10 Description Diagnosis I70.245 Atherosclerosis of native arteries of left leg with ulceration of other part of foot Electronic Signature(s) Signed: 08/25/2015 8:35:45 AM By: Ardath Sax MD [place], Samiha ([id number]) Entered By: Ardath Sax on 08/25/2015 08:35:45

## 2015-09-04 ENCOUNTER — Encounter: Payer: Medicare Other | Admitting: Surgery

## 2015-09-04 DIAGNOSIS — E11621 Type 2 diabetes mellitus with foot ulcer: Secondary | ICD-10-CM | POA: Diagnosis not present

## 2015-09-05 NOTE — Progress Notes (Signed)
[name], Zeffie ([id number]) Visit Report for 09/04/2015 Arrival Information Details Patient Name: [name], Gloria Date of Service: 09/04/2015 10:45 AM Medical Record Number: [id number] Patient Account Number: [id number] Date of Birth/Sex: 03-Apr-1928 (82 y.o. Female) Treating RN: Ashok CordiaPinkerton, Debi Primary Care Physician: Loney LaurenceAMPBELL, MARGARET Other Clinician: Referring Physician: Loney LaurenceAMPBELL, MARGARET Treating Physician/Extender: Rudene ReBritto, Errol Weeks in Treatment: 27 Visit Information History Since Last Visit All ordered tests and consults were completed: No Patient Arrived: Wheel Chair Added or deleted any medications: No Arrival Time: 10:53 Any new allergies or adverse reactions: No Accompanied By: caregiver Had a fall or experienced change in No Transfer Assistance: EasyPivot Patient activities of daily living that may affect Lift risk of falls: Patient Identification Verified: Yes Signs or symptoms of abuse/neglect since last No Secondary Verification Process Yes visito Completed: Hospitalized since last visit: No Patient Requires Transmission- No Pain Present Now: No Based Precautions: Patient Has Alerts: Yes Patient Alerts: Patient on Blood Thinner ABI: Non Compressible Electronic Signature(s) Signed: 09/04/2015 6:42:13 PM By: Alejandro MullingPinkerton, Debra Entered By: Alejandro MullingPinkerton, Debra on 09/04/2015 10:54:48 Minder, Annalynne ([id number]) -------------------------------------------------------------------------------- Encounter Discharge Information Details Patient Name: [name], Gloria Date of Service: 09/04/2015 10:45 AM Medical Record Number: [id number] Patient Account Number: [id number] Date of Birth/Sex: 03-Apr-1928 (82 y.o. Female) Treating RN: Ashok CordiaPinkerton, Debi Primary Care Physician: Loney LaurenceAMPBELL, MARGARET Other Clinician: Referring Physician: Loney LaurenceAMPBELL, MARGARET Treating Physician/Extender: Rudene ReBritto, Errol Weeks in Treatment: 1527 Encounter Discharge Information Items Discharge Pain  Level: 0 Discharge Condition: Stable Ambulatory Status: Wheelchair Discharge Destination: Nursing Home Transportation: Other Accompanied By: caregiver Schedule Follow-up Appointment: Yes Medication Reconciliation completed and provided to Patient/Care Yes Jaydin Boniface: Provided on Clinical Summary of Care: 09/04/2015 Form Type Recipient Paper Patient BM Electronic Signature(s) Signed: 09/04/2015 6:42:13 PM By: Alejandro MullingPinkerton, Debra Previous Signature: 09/04/2015 11:40:23 AM Version By: Gwenlyn PerkingMoore, Shelia Entered By: Alejandro MullingPinkerton, Debra on 09/04/2015 11:50:25 Koelling, Lillionna ([id number]) -------------------------------------------------------------------------------- Lower Extremity Assessment Details Patient Name: [name], Gloria Date of Service: 09/04/2015 10:45 AM Medical Record Number: [id number] Patient Account Number: [id number] Date of Birth/Sex: 03-Apr-1928 (82 y.o. Female) Treating RN: Ashok CordiaPinkerton, Debi Primary Care Physician: Loney LaurenceAMPBELL, MARGARET Other Clinician: Referring Physician: Loney LaurenceAMPBELL, MARGARET Treating Physician/Extender: Rudene ReBritto, Errol Weeks in Treatment: 27 Vascular Assessment Pulses: Posterior Tibial Dorsalis Pedis Palpable: [Left:Yes] Extremity colors, hair growth, and conditions: Extremity Color: [Left:Normal] Temperature of Extremity: [Left:Warm] Capillary Refill: [Left:< 3 seconds] Toe Nail Assessment Left: Right: Thick: Yes Discolored: Yes Deformed: No Improper Length and Hygiene: No Electronic Signature(s) Signed: 09/04/2015 6:42:13 PM By: Alejandro MullingPinkerton, Debra Entered By: Alejandro MullingPinkerton, Debra on 09/04/2015 11:03:52 Linares, Mandi ([id number]) -------------------------------------------------------------------------------- Multi Wound Chart Details Patient Name: [name], Gloria Date of Service: 09/04/2015 10:45 AM Medical Record Number: [id number] Patient Account Number: [id number] Date of Birth/Sex: 03-Apr-1928 (82 y.o. Female) Treating RN: Ashok CordiaPinkerton, Debi Primary  Care Physician: Loney LaurenceAMPBELL, MARGARET Other Clinician: Referring Physician: Loney LaurenceAMPBELL, MARGARET Treating Physician/Extender: Rudene ReBritto, Errol Weeks in Treatment: 27 Vital Signs Height(in): 64 Pulse(bpm): 84 Weight(lbs): Blood Pressure 153/55 (mmHg): Body Mass Index(BMI): Temperature(F): 98.1 Respiratory Rate 16 (breaths/min): Photos: [1:No Photos] [N/A:N/A] Wound Location: [1:Left Calcaneous] [N/A:N/A] Wounding Event: [1:Pressure Injury] [N/A:N/A] Primary Etiology: [1:Pressure Ulcer] [N/A:N/A] Comorbid History: [1:Anemia, Hypertension, Type II Diabetes, History of pressure wounds, Osteoarthritis, Dementia] [N/A:N/A] Date Acquired: [1:01/30/2015] [N/A:N/A] Weeks of Treatment: [1:27] [N/A:N/A] Wound Status: [1:Open] [N/A:N/A] Measurements L x W x D 0.9x0.4x0.1 [N/A:N/A] (cm) Area (cm) : [1:0.283] [N/A:N/A] Volume (cm) : [1:0.028] [N/A:N/A] % Reduction in Area: [1:97.10%] [N/A:N/A] % Reduction in Volume: 97.10% [N/A:N/A] Classification: [1:Unstageable/Unclassified] [N/A:N/A] HBO Classification: [1:Grade 1] [N/A:N/A] Exudate Amount: [1:Large] [N/A:N/A] Exudate Type: [1:Serous] [N/A:N/A] Exudate Color: [1:amber] [N/A:N/A]  Wound Margin: [1:Indistinct, nonvisible] [N/A:N/A] Granulation Amount: [1:Medium (34-66%)] [N/A:N/A] Granulation Quality: [1:Red, Pink] [N/A:N/A] Necrotic Amount: [1:Medium (34-66%)] [N/A:N/A] Exposed Structures: [1:Fascia: No Fat: No Tendon: No Muscle: No] [N/A:N/A] Joint: No Bone: No Limited to Skin Breakdown Epithelialization: Small (1-33%) N/A N/A Periwound Skin Texture: Edema: No N/A N/A Excoriation: No Induration: No Callus: No Crepitus: No Fluctuance: No Friable: No Rash: No Scarring: No Periwound Skin Maceration: Yes N/A N/A Moisture: Moist: Yes Dry/Scaly: No Periwound Skin Color: Erythema: Yes N/A N/A Atrophie Blanche: No Cyanosis: No Ecchymosis: No Hemosiderin Staining: No Mottled: No Pallor: No Rubor: No Erythema Location:  Circumferential N/A N/A Temperature: No Abnormality N/A N/A Tenderness on Yes N/A N/A Palpation: Wound Preparation: Ulcer Cleansing: N/A N/A Rinsed/Irrigated with Saline Topical Anesthetic Applied: Other: lidocaine 4% Treatment Notes Electronic Signature(s) Signed: 09/04/2015 6:42:13 PM By: Alejandro Mulling Entered By: Alejandro Mulling on 09/04/2015 11:11:55 Lewellyn, [name] ([id number]) -------------------------------------------------------------------------------- Multi-Disciplinary Care Plan Details Patient Name: [name] Date of Service: 09/04/2015 10:45 AM Medical Record Number: [id number] Patient Account Number: [id number] Date of Birth/Sex: 1927-12-29 (82 y.o. Female) Treating RN: Ashok Cordia, Debi Primary Care Physician: Loney Laurence Other Clinician: Referring Physician: Loney Laurence Treating Physician/Extender: Rudene Re in Treatment: 39 Active Inactive Orientation to the Wound Care Program Nursing Diagnoses: Knowledge deficit related to the wound healing center program Goals: Patient/caregiver will verbalize understanding of the Wound Healing Center Program Date Initiated: 02/27/2015 Goal Status: Active Interventions: Provide education on orientation to the wound center Notes: Pressure Nursing Diagnoses: Knowledge deficit related to management of pressures ulcers Potential for impaired tissue integrity related to pressure, friction, moisture, and shear Goals: Patient will remain free from development of additional pressure ulcers Date Initiated: 02/27/2015 Goal Status: Active Patient will remain free of pressure ulcers Date Initiated: 02/27/2015 Goal Status: Active Patient/caregiver will verbalize risk factors for pressure ulcer development Date Initiated: 02/27/2015 Goal Status: Active Patient/caregiver will verbalize understanding of pressure ulcer management Date Initiated: 02/27/2015 Goal Status:  Active Interventions: Assess: immobility, friction, shearing, incontinence upon admission and as needed Shroff, Casady ([id number]) Assess offloading mechanisms upon admission and as needed Assess potential for pressure ulcer upon admission and as needed Provide education on pressure ulcers Treatment Activities: Patient referred for home evaluation of offloading devices/mattresses : 08/25/2015 Patient referred for pressure reduction/relief devices : 08/25/2015 Notes: Wound/Skin Impairment Nursing Diagnoses: Impaired tissue integrity Knowledge deficit related to ulceration/compromised skin integrity Goals: Patient/caregiver will verbalize understanding of skin care regimen Date Initiated: 02/27/2015 Goal Status: Active Ulcer/skin breakdown will have a volume reduction of 30% by week 4 Date Initiated: 02/27/2015 Goal Status: Active Ulcer/skin breakdown will have a volume reduction of 50% by week 8 Date Initiated: 02/27/2015 Goal Status: Active Ulcer/skin breakdown will have a volume reduction of 80% by week 12 Date Initiated: 02/27/2015 Goal Status: Active Ulcer/skin breakdown will heal within 14 weeks Date Initiated: 02/27/2015 Goal Status: Active Interventions: Assess patient/caregiver ability to obtain necessary supplies Assess patient/caregiver ability to perform ulcer/skin care regimen upon admission and as needed Assess ulceration(s) every visit Provide education on ulcer and skin care Treatment Activities: Skin care regimen initiated : 08/25/2015 Notes: Electronic Signature(s) BREIONNA, PUNT ([id number]) Signed: 09/04/2015 6:42:13 PM By: Alejandro Mulling Entered By: Alejandro Mulling on 09/04/2015 11:11:47 Sarratt, Lasondra ([id number]) -------------------------------------------------------------------------------- Pain Assessment Details Patient Name: [name] Date of Service: 09/04/2015 10:45 AM Medical Record Number: [id number] Patient Account Number:  [id number] Date of Birth/Sex: 1928-04-27 (82 y.o. Female) Treating RN: Ashok Cordia, Debi Primary Care Physician: Loney Laurence Other Clinician: Referring Physician: Loney Laurence Treating Physician/Extender:  Britto, Errol Weeks in Treatment: 27 Active Problems Location of Pain Severity and Description of Pain Patient Has Paino No Site Locations Pain Management and Medication Current Pain Management: Electronic Signature(s) Signed: 09/04/2015 6:42:13 PM By: Alejandro Mulling Entered By: Alejandro Mulling on 09/04/2015 10:54:56 Konen, Wylodean ([id number]) -------------------------------------------------------------------------------- Patient/Caregiver Education Details Patient Name: [name] Date of Service: 09/04/2015 10:45 AM Medical Record Number: [id number] Patient Account Number: [id number] Date of Birth/Gender: 1928/04/25 (82 y.o. Female) Treating RN: Ashok Cordia, Debi Primary Care Physician: Loney Laurence Other Clinician: Referring Physician: Loney Laurence Treating Physician/Extender: Rudene Re in Treatment: 10 Education Assessment Education Provided To: Patient Education Topics Provided Wound/Skin Impairment: Handouts: Other: change dressing as ordered Methods: Demonstration, Explain/Verbal Responses: State content correctly Electronic Signature(s) Signed: 09/04/2015 6:42:13 PM By: Alejandro Mulling Entered By: Alejandro Mulling on 09/04/2015 11:12:49 Baskette, Jadon ([id number]) -------------------------------------------------------------------------------- Wound Assessment Details Patient Name: [name] Date of Service: 09/04/2015 10:45 AM Medical Record Number: [id number] Patient Account Number: [id number] Date of Birth/Sex: 05-29-1927 (82 y.o. Female) Treating RN: Ashok Cordia, Debi Primary Care Physician: Loney Laurence Other Clinician: Referring Physician: Loney Laurence Treating Physician/Extender: Rudene Re in Treatment: 27 Wound Status Wound Number: 1 Primary Pressure Ulcer Etiology: Wound Location: Left Calcaneous Wound Open Wounding Event: Pressure Injury Status: Date Acquired: 01/30/2015 Comorbid Anemia, Hypertension, Type II Weeks Of Treatment: 27 History: Diabetes, History of pressure wounds, Clustered Wound: No Osteoarthritis, Dementia Photos Photo Uploaded By: Alejandro Mulling on 09/04/2015 16:56:16 Wound Measurements Length: (cm) 0.9 Width: (cm) 0.4 Depth: (cm) 0.1 Area: (cm) 0.283 Volume: (cm) 0.028 % Reduction in Area: 97.1% % Reduction in Volume: 97.1% Epithelialization: Small (1-33%) Tunneling: No Undermining: No Wound Description Classification: Unstageable/Unclassified Foul Od Diabetic Severity (Wagner): Grade 1 Wound Margin: Indistinct, nonvisible Exudate Amount: Large Exudate Type: Serous Exudate Color: amber or After Cleansing: No Wound Bed Granulation Amount: Medium (34-66%) Exposed Structure Granulation Quality: Red, Pink Fascia Exposed: No Necrotic Amount: Medium (34-66%) Fat Layer Exposed: No Calloway, Gloria ([id number]) Necrotic Quality: Adherent Slough Tendon Exposed: No Muscle Exposed: No Joint Exposed: No Bone Exposed: No Limited to Skin Breakdown Periwound Skin Texture Texture Color No Abnormalities Noted: No No Abnormalities Noted: No Callus: No Atrophie Blanche: No Crepitus: No Cyanosis: No Excoriation: No Ecchymosis: No Fluctuance: No Erythema: Yes Friable: No Erythema Location: Circumferential Induration: No Hemosiderin Staining: No Localized Edema: No Mottled: No Rash: No Pallor: No Scarring: No Rubor: No Moisture Temperature / Pain No Abnormalities Noted: No Temperature: No Abnormality Dry / Scaly: No Tenderness on Palpation: Yes Maceration: Yes Moist: Yes Wound Preparation Ulcer Cleansing: Rinsed/Irrigated with Saline Topical Anesthetic Applied: Other: lidocaine 4%, Treatment Notes Wound  #1 (Left Calcaneous) 1. Cleansed with: Clean wound with Normal Saline 2. Anesthetic Topical Lidocaine 4% cream to wound bed prior to debridement 3. Peri-wound Care: Barrier cream Skin Prep 4. Dressing Applied: Aquacel Ag 5. Secondary Dressing Applied Bordered Foam Dressing Dry Gauze 7. Secured with Tape Notes conform BETHANNE, MULE ([id number]) Electronic Signature(s) Signed: 09/04/2015 6:42:13 PM By: Alejandro Mulling Entered By: Alejandro Mulling on 09/04/2015 11:03:25 [name] ([id number]) -------------------------------------------------------------------------------- Vitals Details Patient Name: [name] Date of Service: 09/04/2015 10:45 AM Medical Record Number: [id number] Patient Account Number: [id number] Date of Birth/Sex: Mar 19, 1928 (82 y.o. Female) Treating RN: Ashok Cordia, Debi Primary Care Physician: Loney Laurence Other Clinician: Referring Physician: Loney Laurence Treating Physician/Extender: Rudene Re in Treatment: 27 Vital Signs Time Taken: 10:54 Temperature (F): 98.1 Height (in): 64 Pulse (bpm): 84 Respiratory Rate (breaths/min): 16 Blood Pressure (mmHg): 153/55 Reference Range: 80 - 120 mg /  dl Electronic Signature(s) Signed: 09/04/2015 6:42:13 PM By: Alejandro Mulling Entered By: Alejandro Mulling on 09/04/2015 10:56:35

## 2015-09-05 NOTE — Progress Notes (Addendum)
[name], [name] ([id number]) Visit Report for 09/04/2015 Chief Complaint Document Details Patient Name: [name], [name] 09/04/2015 10:45 Date of Service: AM Medical Record [id number] Number: Patient Account Number: [id number] 03-26-1928 (82 y.o. Treating RN: [name] Date of Birth/Sex: Female) Other Clinician: Primary Care Physician: [name] Treating [name] Referring Physician: Loney [name] Physician/Extender: Weeks in Treatment: 27 Information Obtained from: Patient Chief Complaint patient is back to see [place] today for diabetic foot ulcer which she's had for several months. Electronic Signature(s) Signed: 09/04/2015 11:36:18 AM By: Gloria Kanner MD, FACS Entered By: [name] on 09/04/2015 11:36:18 [name], [name] ([id number]) -------------------------------------------------------------------------------- Debridement Details Patient Name: [name] 09/04/2015 10:45 Date of Service: AM Medical Record [id number] Number: Patient Account Number: [id number] [birth year] (82 y.o. Treating RN: [name] Date of Birth/Sex: Female) Other Clinician: Primary Care Physician: [name] Treating [name] Referring Physician: Loney [name] Physician/Extender: Weeks in Treatment: 27 Debridement Performed for Wound #1 Left Calcaneous Assessment: Performed By: Physician Gloria Kanner, MD Debridement: Debridement Pre-procedure Yes Verification/Time Out Taken: Start Time: 11:23 Pain Control: Lidocaine 4% Topical Solution Level: Skin/Subcutaneous Tissue Total Area Debrided (L x 0.9 (cm) x 0.4 (cm) = 0.36 (cm) W): Tissue and other Viable, Non-Viable, Exudate, Fibrin/Slough, Subcutaneous material debrided: Instrument: Curette Bleeding: Minimum Hemostasis Achieved: Pressure End Time: 11:27 Procedural Pain: 0 Post Procedural Pain: 0 Response to Treatment: Procedure was tolerated well Post Debridement Measurements of  Total Wound Length: (cm) 1 Stage: Unstageable/Unclassified Width: (cm) 0.5 Depth: (cm) 0.1 Volume: (cm) 0.039 Post Procedure Diagnosis Same as Pre-procedure Electronic Signature(s) Signed: 09/04/2015 11:36:06 AM By: Gloria Kanner MD, FACS Signed: 09/04/2015 6:42:13 PM By: [name] Entered By: [name] on 09/04/2015 11:36:06 [name], [name] ([id number]) -------------------------------------------------------------------------------- HPI Details Patient Name: [name], [name] 09/04/2015 10:45 Date of Service: AM Medical Record [id number] Number: Patient Account Number: [id number] Nov 24, 1927 (82 y.o. Treating RN: [name] Date of Birth/Sex: Female) Other Clinician: Primary Care Physician: [name] Treating [name] Referring Physician: Loney [name] Physician/Extender: Weeks in Treatment: 27 History of Present Illness Location: left heel and dorsum of the foot Quality: Patient reports No Pain. Severity: Patient states wound (s) are getting better. Duration: the patient has had surgery in early December and had vascular testing ordered but not done. Timing: Pain in wound is Intermittent (comes and goes Context: The wound appeared gradually over time Modifying Factors: Other treatment(s) tried include: local dressings and offloading. surgery done by [name] for debridement of the left heel wound Associated Signs and Symptoms: Patient reports having difficulty standing for long periods. HPI Description: 82 year old patient with past medical history of Alzheimer's dementia, diabetes mellitus, hypertension and recent history of right hip arthroplasty in August 2016. Her past medical history is significant also for hypothyroidism, hypertension, urinary retention, dementia. 04/11/2015 -- she has had a lot of pain and left heel and there has been a lot of drainage today with purulent material. He has not been running fever. 04/18/2015 -- last  week she was here with a florid infection of her left heel and I had sent her to the hospital for admission. She was admitted between 12/ 2 and 12/ 5 and she underwent a debridement of the left heel abscess with placement of antibiotic beads with vancomycin, by Dr. Linus [name]. Her cultures grew gram- negative bacteria, was given vancomycin and Unasyn IV in hospital and she was continued on oral antibiotics and was given documented for 10 days. at the time of discharge [name] wanted the dressing to be intact for about a  week before it would need changing. 05/29/2014 -- she has not been to see [place] for the last 6 weeks and from what I understand she has been in rehabilitation facility. No vascular testing was done as we had requested. She has a diabetic foot ulcer which was graded as a Wagner 3 when she was seen last about a month and a half ago. There is no family member to discuss hyperbaric oxygen therapy and the patient has as Alzheimer's dementia and I do not believe she understands our discussions 06/12/2015 -- a vascular appointment is pending for tomorrow. 06/19/2015 -- the vascular workup revealed that the left lower extremity had a 50-99% stenosis in the left superficial femoral, popliteal and tibioperoneal trunk. 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 Dr. Wyn [name] has recommended angiographically and possible stent placement for this critical ischemia. 07/03/2015 -- he is going to have a stent placement done by Dr. Wyn [name] this coming Monday. 07/11/2015 -- she was taken for an aortogram and found to have diffuse disease SFA with a high-grade near occlusive stenosis in the distal SFA just above Hunter's canal. One-vessel runoff being the wound area and to get tibial artery at the proximal anterior tibial artery had a short segment occlusion in the distal anterior tibial artery and a moderate to high-grade disease of about 10-12 cm. [name] ([id number]) she had  a percutaneous angioplasty of the distal and middle left anterior tibial artery, proximal left anterior tibial artery and distal SFA and proximal popliteal artery. Electronic Signature(s) Signed: 09/04/2015 11:36:28 AM By: Gloria Kanner MD, FACS Entered By: [name] on 09/04/2015 11:36:28 [name], [name] ([id number]) -------------------------------------------------------------------------------- Physical Exam Details Patient Name: [name], [name] 09/04/2015 10:45 Date of Service: AM Medical Record [id number] Number: Patient Account Number: [id number] 1927-10-09 (82 y.o. Treating RN: [name] Date of Birth/Sex: Female) Other Clinician: Primary Care Physician: [name] Treating Valorie Mcgrory Referring Physician: Loney [name] Physician/Extender: Weeks in Treatment: 27 Constitutional . Pulse regular. Respirations normal and unlabored. Afebrile. . Eyes Nonicteric. Reactive to light. Ears, Nose, Mouth, and Throat Lips, teeth, and gums WNL.Marland Kitchen Moist mucosa without lesions. Neck supple and nontender. No palpable supraclavicular or cervical adenopathy. Normal sized without goiter. Respiratory WNL. No retractions.. Cardiovascular Pedal Pulses WNL. No clubbing, cyanosis or edema. Lymphatic No adneopathy. No adenopathy. No adenopathy. Musculoskeletal Adexa without tenderness or enlargement.. Digits and nails w/o clubbing, cyanosis, infection, petechiae, ischemia, or inflammatory conditions.. Integumentary (Hair, Skin) No suspicious lesions. No crepitus or fluctuance. No peri-wound warmth or erythema. No masses.Marland Kitchen Psychiatric Judgement and insight Intact.. No evidence of depression, anxiety, or agitation.. Notes the wound on the left medial calcaneal area has some callus and some subcutaneous debris as she has not been you for a while. Using a #3 curet I have sharply debrided all of this and bleeding was brisk and this is being controlled with  pressure. Electronic Signature(s) Signed: 09/04/2015 11:37:22 AM By: Gloria Kanner MD, FACS Entered By: [name] on 09/04/2015 11:37:21 [name], [name] ([id number]) -------------------------------------------------------------------------------- Physician Orders Details Patient Name: [name], [name] 09/04/2015 10:45 Date of Service: AM Medical Record [id number] Number: Patient Account Number: [id number] 12-31-1927 (82 y.o. Treating RN: [name] Date of Birth/Sex: Female) Other Clinician: Primary Care Physician: [name] Treating Olly Shiner Referring Physician: Loney [name] Physician/Extender: Weeks in Treatment: 27 Verbal / Phone Orders: Yes ClinicianAshok Cordia, Debi Read Back and Verified: Yes Diagnosis Coding Wound Cleansing Wound #1 Left Calcaneous o Clean wound with Normal Saline. o Cleanse wound with mild soap and  water o May Shower, gently pat wound dry prior to applying new dressing. Anesthetic Wound #1 Left Calcaneous o Topical Lidocaine 4% cream applied to wound bed prior to debridement Skin Barriers/Peri-Wound Care Wound #1 Left Calcaneous o Skin Prep o Barrier cream Primary Wound Dressing Wound #1 Left Calcaneous o Aquacel Ag Secondary Dressing Wound #1 Left Calcaneous o Dry Gauze - to top of foot for protection from wrap o Conform/Kerlix - tape o Boardered Foam Dressing Dressing Change Frequency Wound #1 Left Calcaneous o Change dressing every other day. - for home health purposes Follow-up Appointments Wound #1 Left Calcaneous o Return Appointment in 1 week. Miles, Chanci ([id number]) Off-Loading Wound #1 Left Calcaneous o Heel suspension boot to: - sage boots PEG ASSIST SHOE o Turn and reposition every 2 hours o Other: - FLOAT HEELS WHEN LYING IN BED Additional Orders / Instructions Wound #1 Left Calcaneous o Increase protein intake. Home Health Wound #1 Left Calcaneous o  Continue Home Health Visits - [facility] *****PLEASE CHANGE YOUR DAY TO MONDAYS******* PT IS BEING SEEN IN THE CLINIC ON THURSDAYS NOW******* o Home Health Nurse may visit PRN to address patientos wound care needs. o FACE TO FACE ENCOUNTER: MEDICARE and MEDICAID PATIENTS: I certify that this patient is under my care and that I had a face-to-face encounter that meets the physician face-to-face encounter requirements with this patient on this date. The encounter with the patient was in whole or in part for the following MEDICAL CONDITION: (primary reason for Home Healthcare) MEDICAL NECESSITY: I certify, that based on my findings, NURSING services are a medically necessary home health service. HOME BOUND STATUS: I certify that my clinical findings support that this patient is homebound (i.e., Due to illness or injury, pt requires aid of supportive devices such as crutches, cane, wheelchairs, walkers, the use of special transportation or the assistance of another person to leave their place of residence. There is a normal inability to leave the home and doing so requires considerable and taxing effort. Other absences are for medical reasons / religious services and are infrequent or of short duration when for other reasons). o If current dressing causes regression in wound condition, may D/C ordered dressing product/s and apply Normal Saline Moist Dressing daily until next Wound Healing Center / Other MD appointment. Notify Wound Healing Center of regression in wound condition at [phone]. o Please direct any NON-WOUND related issues/requests for orders to patient's Primary Care Physician Electronic Signature(s) Signed: 09/04/2015 4:28:22 PM By: Gloria Kanner MD, FACS Signed: 09/04/2015 6:42:13 PM By: [name] Entered By: [name] on 09/04/2015 11:49:33 [name], [name]  ([id number]) -------------------------------------------------------------------------------- Problem List Details Patient Name: [name], [name] 09/04/2015 10:45 Date of Service: AM Medical Record [id number] Number: Patient Account Number: [id number] 09/16/1927 (82 y.o. 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 08/08/2015 Yes of other part of foot Inactive Problems Resolved Problems ICD-10 Code Description Active Date Resolved Date L89.521 Pressure ulcer of left ankle, stage 1 02/27/2015 02/27/2015 L03.116 Cellulitis of left lower limb 04/11/2015 04/11/2015 Electronic Signature(s) [name] ([id number]) Signed:  09/04/2015 11:35:55 AM By: Gloria Kanner MD, FACS Entered By: [name] on 09/04/2015 11:35:52 Mendia, [name] ([id number]) -------------------------------------------------------------------------------- Progress Note Details Patient Name: [name], [name] 09/04/2015 10:45 Date of Service: AM Medical Record [id number] Number: Patient Account Number: [id number] February 26, 1928 (82 y.o. Treating RN: [name] Date of Birth/Sex: Female) Other Clinician: Primary Care Physician: [name] Treating Livingston Denner Referring Physician: Loney [name] Physician/Extender: Weeks in Treatment: 27 Subjective Chief Complaint Information obtained from  Patient patient is back to see [place] today for diabetic foot ulcer which she's had for several months. History of Present Illness (HPI) The following HPI elements were documented for the patient's wound: Location: left heel and dorsum of the foot Quality: Patient reports No Pain. Severity: Patient states wound (s) are getting better. Duration: the patient has had surgery in early December and had vascular testing ordered but not done. Timing: Pain in wound is Intermittent (comes and goes Context: The wound appeared gradually over time Modifying Factors: Other treatment(s) tried include: local dressings and offloading. surgery done by [name] for debridement of the left heel wound Associated Signs and Symptoms: Patient reports having difficulty standing for long periods. 82 year old patient with past medical history of Alzheimer's dementia, diabetes mellitus, hypertension and recent history of right hip arthroplasty in August 2016. Her past medical history is significant also for hypothyroidism, hypertension, urinary retention, dementia. 04/11/2015 -- she has had a lot of pain and left heel and there has been a lot of drainage today with purulent material. He has not been running fever. 04/18/2015 -- last week she was here with a florid infection of her left heel and I had sent her to the hospital for admission. She was admitted between 12/ 2 and 12/ 5 and she underwent a debridement of the left heel abscess with placement of antibiotic beads with vancomycin, by Dr. Linus [name]. Her cultures grew gram- negative bacteria, was given vancomycin and Unasyn IV in hospital and she was continued on oral antibiotics and was given documented for 10 days. at the time of discharge [name] wanted the dressing to be intact for about a week before it would need changing. 05/29/2014 -- she has not been to see [place] for the last 6 weeks and from what I understand she has been in rehabilitation facility. No  vascular testing was done as we had requested. She has a diabetic foot ulcer which was graded as a Wagner 3 when she was seen last about a month and a half ago. There is no family member to discuss hyperbaric oxygen therapy and the patient has as Alzheimer's dementia and I do not believe she understands our discussions 06/12/2015 -- a vascular appointment is pending for tomorrow. 06/19/2015 -- the vascular workup revealed that the left lower extremity had a 50-99% stenosis in the left [name], [name] ([id number]) superficial femoral, popliteal and tibioperoneal trunk. 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 Dr. Wyn [name] has recommended angiographically and possible stent placement for this critical ischemia. 07/03/2015 -- he is going to have a stent placement done by Dr. Wyn [name] this coming Monday. 07/11/2015 -- she was taken for an aortogram and found to have diffuse disease SFA with a high-grade near occlusive stenosis in the distal SFA just above Hunter's canal. One-vessel runoff being the wound area and to get tibial artery at the proximal anterior tibial artery had a short segment occlusion in the distal anterior tibial artery and a moderate to high-grade disease of about 10-12 cm. she had  a percutaneous angioplasty of the distal and middle left anterior tibial artery, proximal left anterior tibial artery and distal SFA and proximal popliteal artery. Objective Constitutional Pulse regular. Respirations normal and unlabored. Afebrile. Vitals Time Taken: 10:54 AM, Height: 64 in, Temperature: 98.1 F, Pulse: 84 bpm, Respiratory Rate: 16 breaths/min, Blood Pressure: 153/55 mmHg. Eyes Nonicteric. Reactive to light. Ears, Nose, Mouth, and Throat Lips, teeth, and gums WNL.Marland Kitchen Moist mucosa without lesions. Neck supple and nontender. No palpable supraclavicular or cervical adenopathy. Normal sized without goiter. Respiratory WNL. No retractions.. Cardiovascular Pedal Pulses  WNL. No clubbing, cyanosis or edema. Lymphatic No adneopathy. No adenopathy. No adenopathy. Musculoskeletal Adexa without tenderness or enlargement.. Digits and nails w/o clubbing, cyanosis, infection, petechiae, ischemia, or inflammatory conditions.Marland Kitchen Psychiatric Judgement and insight Intact.. No evidence of depression, anxiety, or agitation.Marland Kitchen [name], [place] ([id number]) General Notes: the wound on the left medial calcaneal area has some callus and some subcutaneous debris as she has not been you for a while. Using a #3 curet I have sharply debrided all of this and bleeding was brisk and this is being controlled with pressure. Integumentary (Hair, Skin) No suspicious lesions. No crepitus or fluctuance. No peri-wound warmth or erythema. No masses.. Wound #1 status is Open. Original cause of wound was Pressure Injury. The wound is located on the Left Calcaneous. The wound measures 0.9cm length x 0.4cm width x 0.1cm depth; 0.283cm^2 area and 0.028cm^3 volume. The wound is limited to skin breakdown. There is no tunneling or undermining noted. There is a large amount of serous drainage noted. The wound margin is indistinct and nonvisible. There is medium (34-66%) red, pink granulation within the wound bed. There is a medium (34-66%) amount of necrotic tissue within the wound bed including Adherent Slough. The periwound skin appearance exhibited: Maceration, Moist, Erythema. The periwound skin appearance did not exhibit: Callus, Crepitus, Excoriation, Fluctuance, Friable, Induration, Localized Edema, Rash, Scarring, Dry/Scaly, Atrophie Blanche, Cyanosis, Ecchymosis, Hemosiderin Staining, Mottled, Pallor, Rubor. The surrounding wound skin color is noted with erythema which is circumferential. Periwound temperature was noted as No Abnormality. The periwound has tenderness on palpation. Assessment Active Problems ICD-10 E11.621 - Type 2 diabetes mellitus with foot ulcer L89.620 - Pressure ulcer  of left heel, unstageable G30.9 - Alzheimer's disease, unspecified L97.422 - Non-pressure chronic ulcer of left heel and midfoot with fat layer exposed I70.245 - Atherosclerosis of native arteries of left leg with ulceration of other part of foot Procedures Wound #1 Wound #1 is a Pressure Ulcer located on the Left Calcaneous . There was a Skin/Subcutaneous Tissue Debridement ([id number]) debridement with total area of 0.36 sq cm performed by Gloria Kanner, MD. with the following instrument(s): Curette to remove Viable and Non-Viable tissue/material including Exudate, Fibrin/Slough, and Subcutaneous after achieving pain control using Lidocaine 4% Topical Solution. A time out was conducted prior to the start of the procedure. A Minimum amount of bleeding was controlled with Pressure. The procedure was tolerated well with a pain level of 0 throughout and a pain level of 0 following the procedure. Post Debridement Measurements: 1cm length x 0.5cm width x 0.1cm depth; 0.039cm^3 volume. Post debridement Stage noted as Unstageable/Unclassified. Herling, Kalista ([id number]) Post procedure Diagnosis Wound #1: Same as Pre-Procedure Plan Wound Cleansing: Wound #1 Left Calcaneous: Clean wound with Normal Saline. Cleanse wound with mild soap and water May Shower, gently pat wound dry prior to applying new dressing. Anesthetic: Wound #1 Left Calcaneous: Topical Lidocaine 4% cream applied to wound bed prior to debridement Skin Barriers/Peri-Wound Care: Wound #1  Left Calcaneous: Skin Prep Barrier cream Primary Wound Dressing: Wound #1 Left Calcaneous: Aquacel Ag Secondary Dressing: Wound #1 Left Calcaneous: Dry Gauze - to top of foot for protection from wrap Conform/Kerlix - tape Boardered Foam Dressing Dressing Change Frequency: Wound #1 Left Calcaneous: Change dressing every other day. - for home health purposes Follow-up Appointments: Wound #1 Left Calcaneous: Return Appointment in 1  week. Off-Loading: Wound #1 Left Calcaneous: Heel suspension boot to: - sage boots PEG ASSIST SHOE Turn and reposition every 2 hours Other: - FLOAT HEELS WHEN LYING IN BED Additional Orders / Instructions: Wound #1 Left Calcaneous: Increase protein intake. Home Health: Wound #1 Left Calcaneous: Continue Home Health Visits - [facility] *****PLEASE CHANGE YOUR DAY TO MONDAYS******* PT IS BEING SEEN IN THE CLINIC ON THURSDAYS NOW******* Home Health Nurse may visit PRN to address patient s wound care needs. FACE TO FACE ENCOUNTER: MEDICARE and MEDICAID PATIENTS: I certify that this patient is under my care and that I had a face-to-face encounter that meets the physician face-to-face encounter CHRISTYL, OSENTOSKI ([id number]) requirements with this patient on this date. The encounter with the patient was in whole or in part for the following MEDICAL CONDITION: (primary reason for Home Healthcare) MEDICAL NECESSITY: I certify, that based on my findings, NURSING services are a medically necessary home health service. HOME BOUND STATUS: I certify that my clinical findings support that this patient is homebound (i.e., Due to illness or injury, pt requires aid of supportive devices such as crutches, cane, wheelchairs, walkers, the use of special transportation or the assistance of another person to leave their place of residence. There is a normal inability to leave the home and doing so requires considerable and taxing effort. Other absences are for medical reasons / religious services and are infrequent or of short duration when for other reasons). If current dressing causes regression in wound condition, may D/C ordered dressing product/s and apply Normal Saline Moist Dressing daily until next Wound Healing Center / Other MD appointment. Notify Wound Healing Center of regression in wound condition at [phone]. Please direct any NON-WOUND related issues/requests for orders to patient's Primary  Care Physician I have recommended Aquacel Ag to the left heel to be changed daily. She is off loading well and continues to have good nutrition and vitamin supplements. She will come back and see as next week. Electronic Signature(s) Signed: 09/05/2015 12:38:22 PM By: Gloria Kanner MD, FACS Previous Signature: 09/04/2015 11:38:03 AM Version By: Gloria Kanner MD, FACS Entered By: [name] on 09/05/2015 12:38:22 [name] ([id number]) -------------------------------------------------------------------------------- SuperBill Details Patient Name: [name] Date of Service: 09/04/2015 Medical Record Number: [id number] Patient Account Number: [id number] Date of Birth/Sex: 01-Feb-1928 (82 y.o. 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 Facility Procedures CPT4: Description Modifier Quantity Code [id number] 11042 - DEB SUBQ TISSUE 20 SQ CM/< 1 ICD-10 Description Diagnosis E11.621 Type 2 diabetes mellitus with foot ulcer L89.620 Pressure ulcer of left heel, unstageable G30.9 Alzheimer's disease, unspecified  I70.245 Atherosclerosis of native arteries of left leg with ulceration of other part of foot Physician Procedures CPT4: Description Modifier Quantity Code  [id number] 11042 - WC PHYS SUBQ TISS 20 SQ CM 1 ICD-10 Description Diagnosis E11.621 Type 2 diabetes mellitus with foot ulcer L89.620 Pressure ulcer of left heel, unstageable G30.9 Alzheimer's disease, unspecified   I70.245 Atherosclerosis of native arteries of left leg with ulceration of other part of foot Electronic Signature(s) Signed: 09/04/2015 11:38:19 AM By: Gloria KannerBritto, Josafat Enrico MD, FACS [place], [place] ([id number]) Entered By: Gloria KannerBritto, Lorren Rossetti on 09/04/2015 11:38:18

## 2015-09-11 ENCOUNTER — Encounter: Payer: Medicare Other | Attending: Surgery | Admitting: Surgery

## 2015-09-11 DIAGNOSIS — M1990 Unspecified osteoarthritis, unspecified site: Secondary | ICD-10-CM | POA: Diagnosis not present

## 2015-09-11 DIAGNOSIS — G309 Alzheimer's disease, unspecified: Secondary | ICD-10-CM | POA: Insufficient documentation

## 2015-09-11 DIAGNOSIS — I70245 Atherosclerosis of native arteries of left leg with ulceration of other part of foot: Secondary | ICD-10-CM | POA: Insufficient documentation

## 2015-09-11 DIAGNOSIS — F0390 Unspecified dementia without behavioral disturbance: Secondary | ICD-10-CM | POA: Diagnosis not present

## 2015-09-11 DIAGNOSIS — I10 Essential (primary) hypertension: Secondary | ICD-10-CM | POA: Insufficient documentation

## 2015-09-11 DIAGNOSIS — E11621 Type 2 diabetes mellitus with foot ulcer: Secondary | ICD-10-CM | POA: Diagnosis present

## 2015-09-11 DIAGNOSIS — E039 Hypothyroidism, unspecified: Secondary | ICD-10-CM | POA: Diagnosis not present

## 2015-09-11 DIAGNOSIS — L97422 Non-pressure chronic ulcer of left heel and midfoot with fat layer exposed: Secondary | ICD-10-CM | POA: Insufficient documentation

## 2015-09-11 DIAGNOSIS — L89620 Pressure ulcer of left heel, unstageable: Secondary | ICD-10-CM | POA: Diagnosis not present

## 2015-09-12 NOTE — Progress Notes (Signed)
[name], [name] ([id number]) Visit Report for 09/11/2015 Arrival Information Details Patient Name: [name], [name] Date of Service: 09/11/2015 2:15 [name] Medical Record Number: [id number] Patient Account Number: [id number] Date of Birth/Sex: [birth year] (82 y.o. Female) Treating RN: [name] Primary Care Physician: [name] Other Clinician: Referring Physician: Loney [name] Treating Physician/Extender: [name]: 28 Visit Information History Since Last Visit All ordered tests and consults were completed: No Patient Arrived: [name] Added or deleted any medications: No Arrival Time: 14:55 Any new allergies or adverse reactions: No Accompanied By: caregiver Had a fall or experienced change in No Transfer Assistance: EasyPivot Patient activities of daily living that may affect Lift risk of falls: Patient Identification Verified: Yes Signs or symptoms of abuse/neglect since last No Secondary Verification Process Yes visito Completed: Hospitalized since last visit: No Patient Requires Transmission- No Pain Present Now: No Based Precautions: Patient Has Alerts: Yes Patient Alerts: Patient on Blood Thinner ABI: Non Compressible Electronic Signature(s) Signed: 09/11/2015 5:52:45 [name] By: [name] Entered By: [name] on 09/11/2015 14:57:55 [name] ([id number]) -------------------------------------------------------------------------------- Clinic Level of Care Assessment Details Patient Name: [name] Date of Service: 09/11/2015 2:15 [name] Medical Record Number: [id number] Patient Account Number: [id number] Date of Birth/Sex: December 16, 1927 (82 y.o. Female) Treating RN: [name] Primary Care Physician: [name] Other Clinician: Referring Physician: Loney [name] Treating Physician/Extender: [name]: 28 Clinic Level of Care Assessment Items TOOL 4 Quantity Score X -  Use when only an EandM is performed on FOLLOW-UP visit 1 0 ASSESSMENTS - Nursing Assessment / Reassessment X - Reassessment of Co-morbidities (includes updates in patient status) 1 10 X - Reassessment of Adherence to [name] Plan 1 5 ASSESSMENTS - Wound and Skin Assessment / Reassessment X - Simple Wound Assessment / Reassessment - one wound 1 5 []  - Complex Wound Assessment / Reassessment - multiple wounds 0 []  - Dermatologic / Skin Assessment (not related to wound area) 0 ASSESSMENTS - Focused Assessment []  - Circumferential Edema Measurements - multi extremities 0 []  - Nutritional Assessment / Counseling / Intervention 0 []  - Lower Extremity Assessment (monofilament, tuning fork, pulses) 0 []  - Peripheral Arterial Disease Assessment (using hand held doppler) 0 ASSESSMENTS - Ostomy and/or Continence Assessment and Care []  - Incontinence Assessment and Management 0 []  - Ostomy Care Assessment and Management (repouching, etc.) 0 PROCESS - Coordination of Care []  - Simple Patient / Family Education for ongoing care 0 X - Complex (extensive) Patient / Family Education for ongoing care 1 20 X - Staff obtains Chiropractor, Records, Test Results / Process Orders 1 10 X - Staff telephones HHA, Nursing Homes / Clarify orders / etc 1 10 []  - Routine Transfer to another Facility (non-emergent condition) 0 [name] ([id number]) []  - Routine Hospital Admission (non-emergent condition) 0 []  - New Admissions / Manufacturing engineer / Ordering NPWT, Apligraf, etc. 0 []  - Emergency Hospital Admission (emergent condition) 0 X - Simple Discharge Coordination 1 10 []  - Complex (extensive) Discharge Coordination 0 PROCESS - Special Needs []  - Pediatric / Minor Patient Management 0 []  - Isolation Patient Management 0 []  - Hearing / Language / Visual special needs 0 []  - Assessment of Community assistance (transportation, D/C planning, etc.) 0 []  - Additional assistance / Altered mentation 0 []   - Support Surface(s) Assessment (bed, cushion, seat, etc.) 0 INTERVENTIONS - Wound Cleansing / Measurement X - Simple Wound Cleansing - one wound 1 5 []  - Complex Wound Cleansing - multiple wounds 0 X - Wound Imaging (  photographs - any number of wounds) 1 5 []  - Wound Tracing (instead of photographs) 0 X - Simple Wound Measurement - one wound 1 5 []  - Complex Wound Measurement - multiple wounds 0 INTERVENTIONS - Wound Dressings X - Small Wound Dressing one or multiple wounds 1 10 []  - Medium Wound Dressing one or multiple wounds 0 []  - Large Wound Dressing one or multiple wounds 0 X - Application of Medications - topical 1 5 []  - Application of Medications - injection 0 INTERVENTIONS - Miscellaneous []  - External ear exam 0 [name] ([id number]) []  - Specimen Collection (cultures, biopsies, blood, body fluids, etc.) 0 []  - Specimen(s) / Culture(s) sent or taken to Lab for analysis 0 []  - Patient Transfer (multiple staff / Michiel SitesHoyer Lift / Similar devices) 0 []  - Simple Staple / Suture removal (25 or less) 0 []  - Complex Staple / Suture removal (26 or more) 0 []  - Hypo / Hyperglycemic Management (close monitor of Blood Glucose) 0 []  - Ankle / Brachial Index (ABI) - do not check if billed separately 0 X - Vital Signs 1 5 Has the patient been seen at the hospital within the last three years: Yes Total Score: 105 Level Of Care: New/Established - Level 3 Electronic Signature(s) Signed: 09/11/2015 5:52:45 [name] By: Gloria MullingPinkerton, Debra Entered By: Gloria MullingPinkerton, Debra on 09/11/2015 16:56:38 [name] ([id number]) -------------------------------------------------------------------------------- Encounter Discharge Information Details Patient Name: [name] Medical Record Number: [id number] Patient Account Number: [id number] Date of Birth/Sex: March 27, 1928 (82 y.o. Female) Treating RN: Gloria CordiaPinkerton, [name] Primary Care Physician: [name] Other  Clinician: Referring Physician: Loney LaurenceAMPBELL, [name] Treating Physician/Extender: Gloria ReBritto, Errol Weeks in [name]: 28 Encounter Discharge Information Items Discharge Pain Level: 0 Discharge Condition: Stable Ambulatory Status: [name] Nursing Discharge Destination: Home Transportation: Other Accompanied By: caregiver Schedule Follow-up Appointment: Yes Medication Reconciliation completed Yes and provided to Patient/Care Sadeel Fiddler: Provided on Clinical Summary of Care: 09/11/2015 Form Type Recipient Paper Patient BM Electronic Signature(s) Signed: 09/11/2015 3:18:54 [name] By: Gwenlyn PerkingMoore, Shelia Entered By: Gwenlyn PerkingMoore, Shelia on 09/11/2015 15:18:53 [name], Gloria ([id number]) -------------------------------------------------------------------------------- Lower Extremity Assessment Details Patient Name: [name] Medical Record Number: [id number] Patient Account Number: [id number] Date of Birth/Sex: March 27, 1928 (82 y.o. Female) Treating RN: Gloria CordiaPinkerton, [name] Primary Care Physician: [name] Other Clinician: Referring Physician: Loney LaurenceAMPBELL, [name] Treating Physician/Extender: Gloria ReBritto, Errol Weeks in [name]: 28 Vascular Assessment Pulses: Posterior Tibial Dorsalis Pedis Palpable: [Left:Yes] Extremity colors, hair growth, and conditions: Extremity Color: [Left:Normal] Temperature of Extremity: [Left:Warm] Capillary Refill: [Left:< 3 seconds] Toe Nail Assessment Left: Right: Thick: Yes Discolored: Yes Deformed: No Improper Length and Hygiene: No Electronic Signature(s) Signed: 09/11/2015 5:52:45 [name] By: Gloria MullingPinkerton, Debra Entered By: Gloria MullingPinkerton, Debra on 09/11/2015 15:00:34 Myung, Gloria MutterBLOSSOM ([id number]) -------------------------------------------------------------------------------- Multi Wound Chart Details Patient Name: [name] Medical Record Number: [id number] Patient Account Number:  [id number] Date of Birth/Sex: March 27, 1928 (82 y.o. Female) Treating RN: Gloria CordiaPinkerton, [name] Primary Care Physician: [name] Other Clinician: Referring Physician: Loney LaurenceAMPBELL, [name] Treating Physician/Extender: Gloria ReBritto, Errol Weeks in [name]: 28 Vital Signs Height(in): 64 Pulse(bpm): 85 Weight(lbs): Blood Pressure 140/66 (mmHg): Body Mass Index(BMI): Temperature(F): 97.9 Respiratory Rate 16 (breaths/min): Photos: [1:No Photos] [N/A:N/A] Wound Location: [1:Left Calcaneous] [N/A:N/A] Wounding Event: [1:Pressure Injury] [N/A:N/A] Primary Etiology: [1:Pressure Ulcer] [N/A:N/A] Comorbid History: [1:Anemia, Hypertension, Type II Diabetes, History of pressure wounds, Osteoarthritis, Dementia] [N/A:N/A] Date Acquired: [1:01/30/2015] [N/A:N/A] Weeks of [name]: [1:28] [N/A:N/A] Wound Status: [1:Open] [N/A:N/A] Measurements L x W x D 0.9x0.4x0.1 [N/A:N/A] (cm)  Area (cm) : [1:0.283] [N/A:N/A] Volume (cm) : [1:0.028] [N/A:N/A] % Reduction in Area: [1:97.10%] [N/A:N/A] % Reduction in Volume: 97.10% [N/A:N/A] Classification: [1:Unstageable/Unclassified] [N/A:N/A] HBO Classification: [1:Grade 1] [N/A:N/A] Exudate Amount: [1:Large] [N/A:N/A] Exudate Type: [1:Serous] [N/A:N/A] Exudate Color: [1:amber] [N/A:N/A] Wound Margin: [1:Indistinct, nonvisible] [N/A:N/A] Granulation Amount: [1:Medium (34-66%)] [N/A:N/A] Granulation Quality: [1:Red, Pink] [N/A:N/A] Necrotic Amount: [1:Medium (34-66%)] [N/A:N/A] Exposed Structures: [1:Fascia: No Fat: No Tendon: No Muscle: No] [N/A:N/A] Joint: No Bone: No Limited to Skin Breakdown Epithelialization: Small (1-33%) N/A N/A Periwound Skin Texture: Edema: No N/A N/A Excoriation: No Induration: No Callus: No Crepitus: No Fluctuance: No Friable: No Rash: No Scarring: No Periwound Skin Maceration: Yes N/A N/A Moisture: Moist: Yes Dry/Scaly: No Periwound Skin Color: Erythema: Yes N/A N/A Atrophie Blanche: No Cyanosis:  No Ecchymosis: No Hemosiderin Staining: No Mottled: No Pallor: No Rubor: No Erythema Location: Circumferential N/A N/A Temperature: No Abnormality N/A N/A Tenderness on Yes N/A N/A Palpation: Wound Preparation: Ulcer Cleansing: N/A N/A Rinsed/Irrigated with Saline Topical Anesthetic Applied: Other: lidocaine 4% [name] Notes Electronic Signature(s) Signed: 09/11/2015 5:52:45 [name] By: [name] Entered By: [name] on 09/11/2015 15:05:02 Russi, [name] ([id number]) -------------------------------------------------------------------------------- Multi-Disciplinary Care Plan Details Patient Name: [name] Date of Service: 09/11/2015 2:15 [name] Medical Record Number: [id number] Patient Account Number: [id number] Date of Birth/Sex: 09-08-1927 (82 y.o. Female) Treating RN: [name] Primary Care Physician: [name] Other Clinician: Referring Physician: Loney [name] Treating Physician/Extender: [name]: 57 Active Inactive Orientation to the Wound Care Program Nursing Diagnoses: Knowledge deficit related to the wound healing center program Goals: Patient/caregiver will verbalize understanding of the Wound Healing Center Program Date Initiated: 02/27/2015 Goal Status: Active Interventions: Provide education on orientation to the wound center Notes: Pressure Nursing Diagnoses: Knowledge deficit related to management of pressures ulcers Potential for impaired tissue integrity related to pressure, friction, moisture, and shear Goals: Patient will remain free from development of additional pressure ulcers Date Initiated: 02/27/2015 Goal Status: Active Patient will remain free of pressure ulcers Date Initiated: 02/27/2015 Goal Status: Active Patient/caregiver will verbalize risk factors for pressure ulcer development Date Initiated: 02/27/2015 Goal Status: Active Patient/caregiver will verbalize understanding  of pressure ulcer management Date Initiated: 02/27/2015 Goal Status: Active Interventions: Assess: immobility, friction, shearing, incontinence upon admission and as needed River, Lyzette ([id number]) Assess offloading mechanisms upon admission and as needed Assess potential for pressure ulcer upon admission and as needed Provide education on pressure ulcers [name] Activities: Patient referred for home evaluation of offloading devices/mattresses : 08/25/2015 Patient referred for pressure reduction/relief devices : 08/25/2015 Notes: Wound/Skin Impairment Nursing Diagnoses: Impaired tissue integrity Knowledge deficit related to ulceration/compromised skin integrity Goals: Patient/caregiver will verbalize understanding of skin care regimen Date Initiated: 02/27/2015 Goal Status: Active Ulcer/skin breakdown will have a volume reduction of 30% by week 4 Date Initiated: 02/27/2015 Goal Status: Active Ulcer/skin breakdown will have a volume reduction of 50% by week 8 Date Initiated: 02/27/2015 Goal Status: Active Ulcer/skin breakdown will have a volume reduction of 80% by week 12 Date Initiated: 02/27/2015 Goal Status: Active Ulcer/skin breakdown will heal within 14 weeks Date Initiated: 02/27/2015 Goal Status: Active Interventions: Assess patient/caregiver ability to obtain necessary supplies Assess patient/caregiver ability to perform ulcer/skin care regimen upon admission and as needed Assess ulceration(s) every visit Provide education on ulcer and skin care [name] Activities: Skin care regimen initiated : 08/25/2015 Notes: Electronic Signature(s) [name], [name] ([id number]) Signed: 09/11/2015 5:52:45 [name] By: [name] Entered By: [name] on 09/11/2015 15:04:52 [name], Gloria ([id number]) -------------------------------------------------------------------------------- Pain Assessment Details Patient  Name: [name], [name] Date of Service: 09/11/2015  2:15 [name] Medical Record Number: [id number] Patient Account Number: [id number] Date of Birth/Sex: [birth year] (82 y.o. Female) Treating RN: [name] Primary Care Physician: [name] Other Clinician: Referring Physician: Loney [name] Treating Physician/Extender: [name]: 28 Active Problems Location of Pain Severity and Description of Pain Patient Has Paino No Site Locations Pain Management and Medication Current Pain Management: Electronic Signature(s) Signed: 09/11/2015 5:52:45 [name] By: [name] Entered By: [name] on 09/11/2015 14:58:06 [name], Gloria ([id number]) -------------------------------------------------------------------------------- Patient/Caregiver Education Details Patient Name: [name] Date of Service: 09/11/2015 2:15 [name] Medical Record Number: [id number] Patient Account Number: [id number] Date of Birth/Gender: [birth year] (82 y.o. Female) Treating RN: [name] Primary Care Physician: [name] Other Clinician: Referring Physician: Loney [name] Treating Physician/Extender: [name]: 28 Education Assessment Education Provided To: Patient Education Topics Provided Wound/Skin Impairment: Handouts: Other: change dressing as ordered Methods: Demonstration, Explain/Verbal Responses: State content correctly Electronic Signature(s) Signed: 09/11/2015 5:52:45 [name] By: [name] Entered By: [name] on 09/11/2015 15:09:52 [name], Gloria ([id number]) -------------------------------------------------------------------------------- Wound Assessment Details Patient Name: [name] Date of Service: 09/11/2015 2:15 [name] Medical Record Number: [id number] Patient Account Number: [id number] Date of Birth/Sex: 1928/03/07 (82 y.o. Female) Treating RN: [name] Primary Care Physician: [name] Other Clinician: Referring Physician:  Loney [name] Treating Physician/Extender: [name]: 28 Wound Status Wound Number: 1 Primary Pressure Ulcer Etiology: Wound Location: Left Calcaneous Wound Open Wounding Event: Pressure Injury Status: Date Acquired: 01/30/2015 Comorbid Anemia, Hypertension, Type II Weeks Of [name]: 28 History: Diabetes, History of pressure wounds, Clustered Wound: No Osteoarthritis, Dementia Photos Photo Uploaded By: [name] on 09/11/2015 17:24:38 Wound Measurements Length: (cm) 0.9 Width: (cm) 0.4 Depth: (cm) 0.1 Area: (cm) 0.283 Volume: (cm) 0.028 % Reduction in Area: 97.1% % Reduction in Volume: 97.1% Epithelialization: Small (1-33%) Tunneling: No Undermining: No Wound Description Classification: Unstageable/Unclassified Foul Od Diabetic Severity (Wagner): Grade 1 Wound Margin: Indistinct, nonvisible Exudate Amount: Large Exudate Type: Serous Exudate Color: amber or After Cleansing: No Wound Bed Granulation Amount: Medium (34-66%) Exposed Structure Granulation Quality: Red, Pink Fascia Exposed: No Necrotic Amount: Medium (34-66%) Fat Layer Exposed: No [name], Gloria ([id number]) Necrotic Quality: Adherent Slough Tendon Exposed: No Muscle Exposed: No Joint Exposed: No Bone Exposed: No Limited to Skin Breakdown Periwound Skin Texture Texture Color No Abnormalities Noted: No No Abnormalities Noted: No Callus: No Atrophie Blanche: No Crepitus: No Cyanosis: No Excoriation: No Ecchymosis: No Fluctuance: No Erythema: Yes Friable: No Erythema Location: Circumferential Induration: No Hemosiderin Staining: No Localized Edema: No Mottled: No Rash: No Pallor: No Scarring: No Rubor: No Moisture Temperature / Pain No Abnormalities Noted: No Temperature: No Abnormality Dry / Scaly: No Tenderness on Palpation: Yes Maceration: Yes Moist: Yes Wound Preparation Ulcer Cleansing: Rinsed/Irrigated with Saline Topical  Anesthetic Applied: Other: lidocaine 4%, [name] Notes Wound #1 (Left Calcaneous) 1. Cleansed with: Clean wound with Normal Saline 2. Anesthetic Topical Lidocaine 4% cream to wound bed prior to debridement 4. Dressing Applied: Aquacel Ag 5. Secondary Dressing Applied Dry Gauze Foam Kerlix/Conform 7. Secured with Secretary/administrator) Signed: 09/11/2015 5:52:45 [name] By: [name] Entered By: [name] on 09/11/2015 15:04:27 Weisbecker, Dama ([id number]) REYLENE, STAUDER ([id number]) -------------------------------------------------------------------------------- Vitals Details Patient Name: [name] Date of Service: 09/11/2015 2:15 [name] Medical Record Number: [id number] Patient Account Number: [id number] Date of Birth/Sex: 1927/05/17 (82 y.o. Female) Treating RN: [name] Primary Care Physician: [name] Other Clinician: Referring Physician: Loney [name] Treating Physician/Extender: Evlyn Kanner  Weeks in [name]: 28 Vital Signs Time Taken: 14:58 Temperature (F): 97.9 Height (in): 64 Pulse (bpm): 85 Respiratory Rate (breaths/min): 16 Blood Pressure (mmHg): 140/66 Reference Range: 80 - 120 mg / dl Electronic Signature(s) Signed: 09/11/2015 5:52:45 [name] By: [name] Entered By: [name] on 09/11/2015 15:00:12

## 2015-09-13 NOTE — Progress Notes (Signed)
[name], [name] ([id number]) Visit Report for 09/11/2015 Chief Complaint Document Details Patient Name: [name], [name] Date of Service: 09/11/2015 2:15 PM Medical Record Number: [id number] Patient Account Number: [id number] Date of Birth/Sex: [birth year] (82 y.o. Female) Treating RN: Ashok Cordia, Debi Primary Care Physician: Loney Laurence Other Clinician: Referring Physician: Loney Laurence Treating Physician/Extender: Rudene Re in Treatment: 28 Information Obtained from: Patient Chief Complaint patient is back to see [place] today for diabetic foot ulcer which she's had for several months. Electronic Signature(s) Signed: 09/11/2015 3:22:33 PM By: Evlyn Kanner MD, FACS Entered By: Evlyn Kanner on 09/11/2015 15:22:33 [name] ([id number]) -------------------------------------------------------------------------------- HPI Details Patient Name: [name] Date of Service: 09/11/2015 2:15 PM Medical Record Number: [id number] Patient Account Number: [id number] Date of Birth/Sex: Nov 09, 1927 (82 y.o. Female) Treating RN: Ashok Cordia, Debi Primary Care Physician: Loney Laurence Other Clinician: Referring Physician: Loney Laurence Treating Physician/Extender: Rudene Re in Treatment: 28 History of Present Illness Location: left heel and dorsum of the foot Quality: Patient reports No Pain. Severity: Patient states wound (s) are getting better. Duration: the patient has had surgery in early December and had vascular testing ordered but not done. Timing: Pain in wound is Intermittent (comes and goes Context: The wound appeared gradually over time Modifying Factors: Other treatment(s) tried include: local dressings and offloading. surgery done by Dr. Graciela Husbands for debridement of the left heel wound Associated Signs and Symptoms: Patient reports having difficulty standing for long periods. HPI Description: 82 year old patient with past medical history of  Alzheimer's dementia, diabetes mellitus, hypertension and recent history of right hip arthroplasty in August 2016. Her past medical history is significant also for hypothyroidism, hypertension, urinary retention, dementia. 04/11/2015 -- she has had a lot of pain and left heel and there has been a lot of drainage today with purulent material. He has not been running fever. 04/18/2015 -- last week she was here with a florid infection of her left heel and I had sent her to the hospital for admission. She was admitted between 12/ 2 and 12/ 5 and she underwent a debridement of the left heel abscess with placement of antibiotic beads with vancomycin, by Dr. Linus Galas. Her cultures grew gram- negative bacteria, was given vancomycin and Unasyn IV in hospital and she was continued on oral antibiotics and was given documented for 10 days. at the time of discharge Dr. Alberteen Spindle wanted the dressing to be intact for about a week before it would need changing. 05/29/2014 -- she has not been to see [place] for the last 6 weeks and from what I understand she has been in rehabilitation facility. No vascular testing was done as we had requested. She has a diabetic foot ulcer which was graded as a Wagner 3 when she was seen last about a month and a half ago. There is no family member to discuss hyperbaric oxygen therapy and the patient has as Alzheimer's dementia and I do not believe she understands our discussions 06/12/2015 -- a vascular appointment is pending for tomorrow. 06/19/2015 -- the vascular workup revealed that the left lower extremity had a 50-99% stenosis in the left superficial femoral, popliteal and tibioperoneal trunk. 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 Dr. Wyn Quaker has recommended angiographically and possible stent placement for this critical ischemia. 07/03/2015 -- he is going to have a stent placement done by Dr. Wyn Quaker this coming Monday. 07/11/2015 -- she was taken for an  aortogram and found to have diffuse disease SFA with a high-grade near occlusive stenosis  in the distal SFA just above Hunter's canal. One-vessel runoff being the wound area and to get tibial artery at the proximal anterior tibial artery had a short segment occlusion in the distal anterior tibial artery and a moderate to high-grade disease of about 10-12 cm. she had a percutaneous angioplasty of the distal and middle left anterior tibial artery, proximal left anterior tibial artery and distal SFA and proximal popliteal artery. [name], [name] ([id number]) Electronic Signature(s) Signed: 09/11/2015 3:22:41 PM By: Evlyn Kanner MD, FACS Entered By: Evlyn Kanner on 09/11/2015 15:22:40 [name], [name] ([id number]) -------------------------------------------------------------------------------- Physical Exam Details Patient Name: [name] Date of Service: 09/11/2015 2:15 PM Medical Record Number: [id number] Patient Account Number: [id number] Date of Birth/Sex: 1928/05/09 (82 y.o. Female) Treating RN: Ashok Cordia, Debi Primary Care Physician: Loney Laurence Other Clinician: Referring Physician: Loney Laurence Treating Physician/Extender: Rudene Re in Treatment: 28 Constitutional . Pulse regular. Respirations normal and unlabored. Afebrile. . Eyes Nonicteric. Reactive to light. Ears, Nose, Mouth, and Throat Lips, teeth, and gums WNL.Marland Kitchen Moist mucosa without lesions. Neck supple and nontender. No palpable supraclavicular or cervical adenopathy. Normal sized without goiter. Respiratory WNL. No retractions.. Cardiovascular Pedal Pulses WNL. No clubbing, cyanosis or edema. Lymphatic No adneopathy. No adenopathy. No adenopathy. Musculoskeletal Adexa without tenderness or enlargement.. Digits and nails w/o clubbing, cyanosis, infection, petechiae, ischemia, or inflammatory conditions.. Integumentary (Hair, Skin) No suspicious lesions. No crepitus or fluctuance. No  peri-wound warmth or erythema. No masses.Marland Kitchen Psychiatric Judgement and insight Intact.. No evidence of depression, anxiety, or agitation.. Notes the wound looks excellent today which needed minimal washing out with moist saline gauze and is healthy granulation tissue below this. No debridement was required today. Electronic Signature(s) Signed: 09/11/2015 3:25:16 PM By: Evlyn Kanner MD, FACS Entered By: Evlyn Kanner on 09/11/2015 15:25:16 [name] ([id number]) -------------------------------------------------------------------------------- Physician Orders Details Patient Name: [name] Date of Service: 09/11/2015 2:15 PM Medical Record Number: [id number] Patient Account Number: [id number] Date of Birth/Sex: July 25, 1927 (82 y.o. Female) Treating RN: Ashok Cordia, Debi Primary Care Physician: Loney Laurence Other Clinician: Referring Physician: Loney Laurence Treating Physician/Extender: Rudene Re in Treatment: 12 Verbal / Phone Orders: Yes Clinician: Ashok Cordia, Debi Read Back and Verified: Yes Diagnosis Coding Wound Cleansing Wound #1 Left Calcaneous o Clean wound with Normal Saline. o Cleanse wound with mild soap and water o May Shower, gently pat wound dry prior to applying new dressing. Anesthetic Wound #1 Left Calcaneous o Topical Lidocaine 4% cream applied to wound bed prior to debridement Skin Barriers/Peri-Wound Care Wound #1 Left Calcaneous o Skin Prep o Barrier cream Primary Wound Dressing Wound #1 Left Calcaneous o Aquacel Ag Secondary Dressing Wound #1 Left Calcaneous o Dry Gauze - to top of foot for protection from wrap o Conform/Kerlix - tape o Foam Dressing Change Frequency Wound #1 Left Calcaneous o Change dressing every other day. - for home health purposes Follow-up Appointments Wound #1 Left Calcaneous o Return Appointment in 1 week. Off-Loading Wound #1 Left Calcaneous [name], [name]  ([id number]) o Heel suspension boot to: - sage boots PEG ASSIST SHOE o Turn and reposition every 2 hours o Other: - FLOAT HEELS WHEN LYING IN BED Additional Orders / Instructions Wound #1 Left Calcaneous o Increase protein intake. Home Health Wound #1 Left Calcaneous o Continue Home Health Visits - [facility] o Home Health Nurse may visit PRN to address patientos wound care needs. o FACE TO FACE ENCOUNTER: MEDICARE and MEDICAID PATIENTS: I certify that this patient is under my care and that I had a face-to-face encounter that  meets the physician face-to-face encounter requirements with this patient on this date. The encounter with the patient was in whole or in part for the following MEDICAL CONDITION: (primary reason for Home Healthcare) MEDICAL NECESSITY: I certify, that based on my findings, NURSING services are a medically necessary home health service. HOME BOUND STATUS: I certify that my clinical findings support that this patient is homebound (i.e., Due to illness or injury, pt requires aid of supportive devices such as crutches, cane, wheelchairs, walkers, the use of special transportation or the assistance of another person to leave their place of residence. There is a normal inability to leave the home and doing so requires considerable and taxing effort. Other absences are for medical reasons / religious services and are infrequent or of short duration when for other reasons). o If current dressing causes regression in wound condition, may D/C ordered dressing product/s and apply Normal Saline Moist Dressing daily until next Wound Healing Center / Other MD appointment. Notify Wound Healing Center of regression in wound condition at [phone]. o Please direct any NON-WOUND related issues/requests for orders to patient's Primary Care Physician Electronic Signature(s) Signed: 09/11/2015 5:52:45 PM By: Alejandro Mulling Signed: 09/12/2015 4:31:56 PM By: Evlyn Kanner MD, FACS Previous Signature: 09/11/2015 4:29:13 PM Version By: Evlyn Kanner MD, FACS Entered By: Alejandro Mulling on 09/11/2015 16:53:51 [name], [name] ([id number]) -------------------------------------------------------------------------------- Problem List Details Patient Name: [name] Date of Service: 09/11/2015 2:15 PM Medical Record Number: [id number] Patient Account Number: [id number] Date of Birth/Sex: January 30, 1928 (82 y.o. 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 05/30/2015 Yes 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 08/08/2015 Yes of other part of foot Inactive Problems Resolved Problems ICD-10 Code Description Active Date Resolved Date L89.521 Pressure ulcer of left ankle, stage 1 02/27/2015 02/27/2015 L03.116 Cellulitis of left lower limb 04/11/2015 04/11/2015 Electronic Signature(s) Signed: 09/11/2015 3:22:26 PM By: Evlyn Kanner MD, FACS Entered By: Evlyn Kanner on 09/11/2015 15:22:26 [name], [name] ([id number]) [name], [name] ([id number]) -------------------------------------------------------------------------------- Progress Note Details Patient Name: [name] Date of Service: 09/11/2015 2:15 PM Medical Record Number: [id number] Patient Account Number: [id number] Date of Birth/Sex: 05-10-1928 (82 y.o. Female) Treating RN: Ashok Cordia, Debi Primary Care Physician: Loney Laurence Other Clinician: Referring Physician:  Loney Laurence Treating Physician/Extender: Rudene Re in Treatment: 28 Subjective Chief Complaint Information obtained from Patient patient is back to see [place] today for diabetic foot ulcer which she's had for several months. History of Present Illness (HPI) The following HPI elements were documented for the patient's wound: Location: left heel and dorsum of the foot Quality: Patient reports No Pain. Severity: Patient states wound (s) are getting better. Duration: the patient has had surgery in early December and had vascular testing ordered but not done. Timing: Pain in wound is Intermittent (comes and goes Context: The wound appeared gradually over time Modifying Factors: Other treatment(s) tried include: local dressings and offloading. surgery done by Dr. Graciela Husbands for debridement of the left heel wound Associated Signs and Symptoms: Patient reports having difficulty standing for long periods. 82 year old patient with past medical history of Alzheimer's dementia, diabetes mellitus, hypertension and recent history of  right hip arthroplasty in August 2016. Her past medical history is significant also for hypothyroidism, hypertension, urinary retention, dementia. 04/11/2015 -- she has had a lot of pain and left heel and there has been a lot of drainage today with purulent material. He has not been running fever. 04/18/2015 -- last week she was here with a florid infection of her left heel and I had sent her to the hospital for admission. She was admitted between 12/ 2 and 12/ 5 and she underwent a debridement of the left heel abscess with placement of antibiotic beads with vancomycin, by Dr. Linus Galas. Her cultures grew gram- negative bacteria, was given vancomycin and Unasyn IV in hospital and she was continued on oral antibiotics and was given documented for 10 days. at the time of discharge Dr. Alberteen Spindle wanted the dressing to be intact for about a week before it would need  changing. 05/29/2014 -- she has not been to see [place] for the last 6 weeks and from what I understand she has been in rehabilitation facility. No vascular testing was done as we had requested. She has a diabetic foot ulcer which was graded as a Wagner 3 when she was seen last about a month and a half ago. There is no family member to discuss hyperbaric oxygen therapy and the patient has as Alzheimer's dementia and I do not believe she understands our discussions 06/12/2015 -- a vascular appointment is pending for tomorrow. 06/19/2015 -- the vascular workup revealed that the left lower extremity had a 50-99% stenosis in the left superficial femoral, popliteal and tibioperoneal trunk. 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 Dr. Wyn Quaker has recommended angiographically and possible stent placement for this critical ischemia. [name], [name] ([id number]) 07/03/2015 -- he is going to have a stent placement done by Dr. Wyn Quaker this coming Monday. 07/11/2015 -- she was taken for an aortogram and found to have diffuse disease SFA with a high-grade near occlusive stenosis in the distal SFA just above Hunter's canal. One-vessel runoff being the wound area and to get tibial artery at the proximal anterior tibial artery had a short segment occlusion in the distal anterior tibial artery and a moderate to high-grade disease of about 10-12 cm. she had a percutaneous angioplasty of the distal and middle left anterior tibial artery, proximal left anterior tibial artery and distal SFA and proximal popliteal artery. Objective Constitutional Pulse regular. Respirations normal and unlabored. Afebrile. Vitals Time Taken: 2:58 PM, Height: 64 in, Temperature: 97.9 F, Pulse: 85 bpm, Respiratory Rate: 16 breaths/min, Blood Pressure: 140/66 mmHg. Eyes Nonicteric. Reactive to light. Ears, Nose, Mouth, and Throat Lips, teeth, and gums WNL.Marland Kitchen Moist mucosa without lesions. Neck supple and nontender. No  palpable supraclavicular or cervical adenopathy. Normal sized without goiter. Respiratory WNL. No retractions.. Cardiovascular Pedal Pulses WNL. No clubbing, cyanosis or edema. Lymphatic No adneopathy. No adenopathy. No adenopathy. Musculoskeletal Adexa without tenderness or enlargement.. Digits and nails w/o clubbing, cyanosis, infection, petechiae, ischemia, or inflammatory conditions.Marland Kitchen Psychiatric Judgement and insight Intact.. No evidence of depression, anxiety, or agitation.. General Notes: the wound looks excellent today which needed minimal washing out with moist saline gauze Mapps, Tagen ([id number]) and is healthy granulation tissue below this. No debridement was required today. Integumentary (Hair, Skin) No suspicious lesions. No crepitus or fluctuance. No peri-wound warmth or erythema. No masses.. Wound #1 status is Open. Original cause of wound was Pressure Injury. The wound is located on the Left Calcaneous. The wound measures 0.9cm length x 0.4cm  width x 0.1cm depth; 0.283cm^2 area and 0.028cm^3 volume. The wound is limited to skin breakdown. There is no tunneling or undermining noted. There is a large amount of serous drainage noted. The wound margin is indistinct and nonvisible. There is medium (34-66%) red, pink granulation within the wound bed. There is a medium (34-66%) amount of necrotic tissue within the wound bed including Adherent Slough. The periwound skin appearance exhibited: Maceration, Moist, Erythema. The periwound skin appearance did not exhibit: Callus, Crepitus, Excoriation, Fluctuance, Friable, Induration, Localized Edema, Rash, Scarring, Dry/Scaly, Atrophie Blanche, Cyanosis, Ecchymosis, Hemosiderin Staining, Mottled, Pallor, Rubor. The surrounding wound skin color is noted with erythema which is circumferential. Periwound temperature was noted as No Abnormality. The periwound has tenderness on palpation. Assessment Active Problems ICD-10 E11.621 -  Type 2 diabetes mellitus with foot ulcer L89.620 - Pressure ulcer of left heel, unstageable G30.9 - Alzheimer's disease, unspecified L97.422 - Non-pressure chronic ulcer of left heel and midfoot with fat layer exposed I70.245 - Atherosclerosis of native arteries of left leg with ulceration of other part of foot Plan Wound Cleansing: Wound #1 Left Calcaneous: Clean wound with Normal Saline. Cleanse wound with mild soap and water May Shower, gently pat wound dry prior to applying new dressing. Anesthetic: Wound #1 Left Calcaneous: Topical Lidocaine 4% cream applied to wound bed prior to debridement Skin Barriers/Peri-Wound Care: Wound #1 Left Calcaneous: Skin Prep Barrier cream [name], [name] ([id number]) Primary Wound Dressing: Wound #1 Left Calcaneous: Aquacel Ag Secondary Dressing: Wound #1 Left Calcaneous: Dry Gauze - to top of foot for protection from wrap Conform/Kerlix - tape Foam Dressing Change Frequency: Wound #1 Left Calcaneous: Change dressing every other day. - for home health purposes Follow-up Appointments: Wound #1 Left Calcaneous: Return Appointment in 1 week. Off-Loading: Wound #1 Left Calcaneous: Heel suspension boot to: - sage boots PEG ASSIST SHOE Turn and reposition every 2 hours Other: - FLOAT HEELS WHEN LYING IN BED Additional Orders / Instructions: Wound #1 Left Calcaneous: Increase protein intake. Home Health: Wound #1 Left Calcaneous: Continue Home Health Visits - [facility] Home Health Nurse may visit PRN to address patient s wound care needs. FACE TO FACE ENCOUNTER: MEDICARE and MEDICAID PATIENTS: I certify that this patient is under my care and that I had a face-to-face encounter that meets the physician face-to-face encounter requirements with this patient on this date. The encounter with the patient was in whole or in part for the following MEDICAL CONDITION: (primary reason for Home Healthcare) MEDICAL NECESSITY: I certify, that based  on my findings, NURSING services are a medically necessary home health service. HOME BOUND STATUS: I certify that my clinical findings support that this patient is homebound (i.e., Due to illness or injury, pt requires aid of supportive devices such as crutches, cane, wheelchairs, walkers, the use of special transportation or the assistance of another person to leave their place of residence. There is a normal inability to leave the home and doing so requires considerable and taxing effort. Other absences are for medical reasons / religious services and are infrequent or of short duration when for other reasons). If current dressing causes regression in wound condition, may D/C ordered dressing product/s and apply Normal Saline Moist Dressing daily until next Wound Healing Center / Other MD appointment. Notify Wound Healing Center of regression in wound condition at [phone]. Please direct any NON-WOUND related issues/requests for orders to patient's Primary Care Physician We will continue with Aquacel Ag and a bordered foam and she will continue to offload. Her  caregiver says that her diet has decreased significantly of late and I have cajoled her into eating better and continued to take her vitamins. [name], [name] ([id number]) She will see me back next week Electronic Signature(s) Signed: 09/12/2015 1:34:24 PM By: Evlyn Kanner MD, FACS Previous Signature: 09/11/2015 3:26:48 PM Version By: Evlyn Kanner MD, FACS Entered By: Evlyn Kanner on 09/12/2015 13:34:24 [name], [name] ([id number]) -------------------------------------------------------------------------------- SuperBill Details Patient Name: [name] Date of Service: 09/11/2015 Medical Record Number: [id number] Patient Account Number: [id number] Date of Birth/Sex: 03/24/1928 (82 y.o. 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 of other part of foot Facility Procedures CPT4 Code: [id number] Description: 99213 - WOUND CARE VISIT-LEV 3 EST PT Modifier: Quantity: 1 Physician Procedures CPT4: Description Modifier Quantity Code [id number] 99213 - WC PHYS LEVEL 3 - EST PT 1 ICD-10 Description Diagnosis E11.621 Type 2 diabetes mellitus with foot ulcer L89.620 Pressure ulcer of left heel, unstageable L97.422 Non-pressure chronic ulcer of left  heel and midfoot with fat 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 of other part of foot Electronic Signature(s) Signed: 09/11/2015 5:52:45 PM By: Alejandro Mulling Signed: 09/12/2015 4:31:56 PM By: Evlyn Kanner MD, FACS Previous Signature: 09/11/2015 3:27:17 PM Version By: Evlyn Kanner MD, FACS Entered By: Alejandro Mulling on 09/11/2015 16:56:49

## 2015-09-18 ENCOUNTER — Encounter: Payer: Medicare Other | Admitting: Surgery

## 2015-09-18 DIAGNOSIS — E11621 Type 2 diabetes mellitus with foot ulcer: Secondary | ICD-10-CM | POA: Diagnosis not present

## 2015-09-19 NOTE — Progress Notes (Signed)
[name] ([id number]) Visit Report for 09/18/2015 Arrival Information Details Patient Name: [name], [name] Date of Service: 09/18/2015 3:00 PM Medical Record Number: [id number] Patient Account Number: [id number] Date of Birth/Sex: Oct 16, 1927 (82 y.o. Female) Treating RN: Ashok Cordia, Debi Primary Care Physician: Loney Laurence Other Clinician: Referring Physician: Loney Laurence Treating Physician/Extender: Rudene Re in Treatment: 29 Visit Information History Since Last Visit All ordered tests and consults were completed: No Patient Arrived: Wheel Chair Added or deleted any medications: No Arrival Time: 14:57 Any new allergies or adverse reactions: No Accompanied By: caregiver Had a fall or experienced change in No Transfer Assistance: EasyPivot Patient activities of daily living that may affect Lift risk of falls: Patient Identification Verified: Yes Signs or symptoms of abuse/neglect since last No Secondary Verification Process Yes visito Completed: Hospitalized since last visit: No Patient Requires Transmission- No Pain Present Now: No Based Precautions: Patient Has Alerts: Yes Patient Alerts: Patient on Blood Thinner ABI: Non Compressible Electronic Signature(s) Signed: 09/18/2015 4:42:35 PM By: Alejandro Mulling Entered By: Alejandro Mulling on 09/18/2015 14:57:47 Edgington, Neda ([id number]) -------------------------------------------------------------------------------- Clinic Level of Care Assessment Details Patient Name: [name] Date of Service: 09/18/2015 3:00 PM Medical Record Number: [id number] Patient Account Number: [id number] Date of Birth/Sex: 21-Feb-1928 (82 y.o. Female) Treating RN: Ashok Cordia, Debi Primary Care Physician: Loney Laurence Other Clinician: Referring Physician: Loney Laurence Treating Physician/Extender: Rudene Re in Treatment: 29 Clinic Level of Care Assessment Items TOOL 4 Quantity  Score X - Use when only an EandM is performed on FOLLOW-UP visit 1 0 ASSESSMENTS - Nursing Assessment / Reassessment X - Reassessment of Co-morbidities (includes updates in patient status) 1 10 X - Reassessment of Adherence to Treatment Plan 1 5 ASSESSMENTS - Wound and Skin Assessment / Reassessment X - Simple Wound Assessment / Reassessment - one wound 1 5 []  - Complex Wound Assessment / Reassessment - multiple wounds 0 []  - Dermatologic / Skin Assessment (not related to wound area) 0 ASSESSMENTS - Focused Assessment []  - Circumferential Edema Measurements - multi extremities 0 []  - Nutritional Assessment / Counseling / Intervention 0 []  - Lower Extremity Assessment (monofilament, tuning fork, pulses) 0 []  - Peripheral Arterial Disease Assessment (using hand held doppler) 0 ASSESSMENTS - Ostomy and/or Continence Assessment and Care []  - Incontinence Assessment and Management 0 []  - Ostomy Care Assessment and Management (repouching, etc.) 0 PROCESS - Coordination of Care []  - Simple Patient / Family Education for ongoing care 0 X - Complex (extensive) Patient / Family Education for ongoing care 1 20 X - Staff obtains Chiropractor, Records, Test Results / Process Orders 1 10 X - Staff telephones HHA, Nursing Homes / Clarify orders / etc 1 10 []  - Routine Transfer to another Facility (non-emergent condition) 0 Montoya, Jamisyn ([id number]) []  - Routine Hospital Admission (non-emergent condition) 0 []  - New Admissions / Manufacturing engineer / Ordering NPWT, Apligraf, etc. 0 []  - Emergency Hospital Admission (emergent condition) 0 X - Simple Discharge Coordination 1 10 []  - Complex (extensive) Discharge Coordination 0 PROCESS - Special Needs []  - Pediatric / Minor Patient Management 0 []  - Isolation Patient Management 0 []  - Hearing / Language / Visual special needs 0 []  - Assessment of Community assistance (transportation, D/C planning, etc.) 0 []  - Additional assistance / Altered  mentation 0 []  - Support Surface(s) Assessment (bed, cushion, seat, etc.) 0 INTERVENTIONS - Wound Cleansing / Measurement X - Simple Wound Cleansing - one wound 1 5 []  - Complex Wound Cleansing - multiple wounds 0 X - Wound  Imaging (photographs - any number of wounds) 1 5 []  - Wound Tracing (instead of photographs) 0 []  - Simple Wound Measurement - one wound 0 []  - Complex Wound Measurement - multiple wounds 0 INTERVENTIONS - Wound Dressings []  - Small Wound Dressing one or multiple wounds 0 X - Medium Wound Dressing one or multiple wounds 1 15 []  - Large Wound Dressing one or multiple wounds 0 []  - Application of Medications - topical 0 []  - Application of Medications - injection 0 INTERVENTIONS - Miscellaneous []  - External ear exam 0 Laiche, Vinisha ([id number]) []  - Specimen Collection (cultures, biopsies, blood, body fluids, etc.) 0 []  - Specimen(s) / Culture(s) sent or taken to Lab for analysis 0 []  - Patient Transfer (multiple staff / Michiel Sites Lift / Similar devices) 0 []  - Simple Staple / Suture removal (25 or less) 0 []  - Complex Staple / Suture removal (26 or more) 0 []  - Hypo / Hyperglycemic Management (close monitor of Blood Glucose) 0 []  - Ankle / Brachial Index (ABI) - do not check if billed separately 0 X - Vital Signs 1 5 Has the patient been seen at the hospital within the last three years: Yes Total Score: 100 Level Of Care: New/Established - Level 3 Electronic Signature(s) Signed: 09/18/2015 4:42:35 PM By: Alejandro Mulling Entered By: Alejandro Mulling on 09/18/2015 16:26:48 Spielberg, [name] ([id number]) -------------------------------------------------------------------------------- Encounter Discharge Information Details Patient Name: [name] Date of Service: 09/18/2015 3:00 PM Medical Record Number: [id number] Patient Account Number: [id number] Date of Birth/Sex: 05/29/1927 (82 y.o. Female) Treating RN: Ashok Cordia, Debi Primary Care Physician: Loney Laurence Other Clinician: Referring Physician: Loney Laurence Treating Physician/Extender: Rudene Re in Treatment: 29 Encounter Discharge Information Items Discharge Pain Level: 0 Discharge Condition: Stable Ambulatory Status: Wheelchair Discharge Destination: Nursing Home Transportation: Other caregiver Accompanied By: Vernona Rieger) Schedule Follow-up Appointment: No Medication Reconciliation completed and provided to Patient/Care Yes Cleora Karnik: Provided on Clinical Summary of Care: 09/18/2015 Form Type Recipient Paper Patient BM Electronic Signature(s) Signed: 09/18/2015 3:30:25 PM By: Gwenlyn Perking Entered By: Gwenlyn Perking on 09/18/2015 15:30:25 Oltmann, Emery ([id number]) -------------------------------------------------------------------------------- Lower Extremity Assessment Details Patient Name: [name] Date of Service: 09/18/2015 3:00 PM Medical Record Number: [id number] Patient Account Number: [id number] Date of Birth/Sex: 10-06-1927 (82 y.o. Female) Treating RN: Ashok Cordia, Debi Primary Care Physician: Loney Laurence Other Clinician: Referring Physician: Loney Laurence Treating Physician/Extender: Rudene Re in Treatment: 29 Vascular Assessment Pulses: Posterior Tibial Dorsalis Pedis Palpable: [Left:Yes] Extremity colors, hair growth, and conditions: Extremity Color: [Left:Normal] Temperature of Extremity: [Left:Warm] Capillary Refill: [Left:< 3 seconds] Toe Nail Assessment Left: Right: Thick: Yes Discolored: Yes Deformed: No Improper Length and Hygiene: No Electronic Signature(s) Signed: 09/18/2015 4:42:35 PM By: Alejandro Mulling Entered By: Alejandro Mulling on 09/18/2015 15:01:32 Metivier, Cintia ([id number]) -------------------------------------------------------------------------------- Multi Wound Chart Details Patient Name: [name] Date of Service: 09/18/2015 3:00 PM Medical Record Number:  [id number] Patient Account Number: [id number] Date of Birth/Sex: 1928/03/27 (82 y.o. Female) Treating RN: Ashok Cordia, Debi Primary Care Physician: Loney Laurence Other Clinician: Referring Physician: Loney Laurence Treating Physician/Extender: Rudene Re in Treatment: 29 Vital Signs Height(in): 64 Pulse(bpm): 78 Weight(lbs): Blood Pressure 136/56 (mmHg): Body Mass Index(BMI): Temperature(F): 98.3 Respiratory Rate 16 (breaths/min): Photos: [1:No Photos] [N/A:N/A] Wound Location: [1:Left Calcaneous] [N/A:N/A] Wounding Event: [1:Pressure Injury] [N/A:N/A] Primary Etiology: [1:Pressure Ulcer] [N/A:N/A] Comorbid History: [1:Anemia, Hypertension, Type II Diabetes, History of pressure wounds, Osteoarthritis, Dementia] [N/A:N/A] Date Acquired: [1:01/30/2015] [N/A:N/A] Weeks of Treatment: [1:29] [N/A:N/A] Wound Status: [1:Open] [N/A:N/A] Measurements L x W x D 0x0x0 [N/A:N/A] (cm)  Area (cm) : [1:0] [N/A:N/A] Volume (cm) : [1:0] [N/A:N/A] % Reduction in Area: [1:100.00%] [N/A:N/A] % Reduction in Volume: 100.00% [N/A:N/A] Classification: [1:Unstageable/Unclassified] [N/A:N/A] HBO Classification: [1:Grade 1] [N/A:N/A] Exudate Amount: [1:None Present] [N/A:N/A] Wound Margin: [1:Indistinct, nonvisible] [N/A:N/A] Granulation Amount: [1:None Present (0%)] [N/A:N/A] Necrotic Amount: [1:None Present (0%)] [N/A:N/A] Exposed Structures: [1:Fascia: No Fat: No Tendon: No Muscle: No Joint: No Bone: No] [N/A:N/A] Limited to Skin Breakdown Epithelialization: Large (67-100%) N/A N/A Periwound Skin Texture: Edema: No N/A N/A Excoriation: No Induration: No Callus: No Crepitus: No Fluctuance: No Friable: No Rash: No Scarring: No Periwound Skin Maceration: No N/A N/A Moisture: Moist: No Dry/Scaly: No Periwound Skin Color: Erythema: Yes N/A N/A Atrophie Blanche: No Cyanosis: No Ecchymosis: No Hemosiderin Staining: No Mottled: No Pallor: No Rubor: No Erythema  Location: Circumferential N/A N/A Temperature: No Abnormality N/A N/A Tenderness on Yes N/A N/A Palpation: Wound Preparation: Ulcer Cleansing: N/A N/A Rinsed/Irrigated with Saline Topical Anesthetic Applied: None Treatment Notes Electronic Signature(s) Signed: 09/18/2015 4:42:35 PM By: Alejandro Mulling Entered By: Alejandro Mulling on 09/18/2015 15:18:19 Ahlgren, [name] ([id number]) -------------------------------------------------------------------------------- Multi-Disciplinary Care Plan Details Patient Name: [name] Date of Service: 09/18/2015 3:00 PM Medical Record Number: [id number] Patient Account Number: [id number] Date of Birth/Sex: Jul 29, 1927 (82 y.o. Female) Treating RN: [name] Haggis Primary Care Physician: Loney Laurence Other Clinician: Referring Physician: Loney Laurence Treating Physician/Extender: Rudene Re in Treatment: 71 Active Inactive Electronic Signature(s) Signed: 09/18/2015 4:42:35 PM By: Alejandro Mulling Entered By: Alejandro Mulling on 09/18/2015 16:32:41 Gorgas, Ange ([id number]) -------------------------------------------------------------------------------- Pain Assessment Details Patient Name: [name] Date of Service: 09/18/2015 3:00 PM Medical Record Number: [id number] Patient Account Number: [id number] Date of Birth/Sex: 03/02/1928 (82 y.o. Female) Treating RN: Ashok Cordia, Debi Primary Care Physician: Loney Laurence Other Clinician: Referring Physician: Loney Laurence Treating Physician/Extender: Rudene Re in Treatment: 29 Active Problems Location of Pain Severity and Description of Pain Patient Has Paino No Site Locations Pain Management and Medication Current Pain Management: Electronic Signature(s) Signed: 09/18/2015 4:42:35 PM By: Alejandro Mulling Entered By: Alejandro Mulling on 09/18/2015 14:57:56 Deyoe, Billie  ([id number]) -------------------------------------------------------------------------------- Patient/Caregiver Education Details Patient Name: [name] Date of Service: 09/18/2015 3:00 PM Medical Record Number: [id number] Patient Account Number: [id number] Date of Birth/Gender: 10-08-1927 (82 y.o. Female) Treating RN: Ashok Cordia, Debi Primary Care Physician: Loney Laurence Other Clinician: Referring Physician: Loney Laurence Treating Physician/Extender: Rudene Re in Treatment: 54 Education Assessment Education Provided To: Caregiver Education Topics Provided Wound/Skin Impairment: Handouts: Other: Please call and make appt if wound starts to drain. Methods: Demonstration, Explain/Verbal Responses: State content correctly Electronic Signature(s) Signed: 09/18/2015 4:42:35 PM By: Alejandro Mulling Entered By: Alejandro Mulling on 09/18/2015 15:24:17 Yeatts, Cleda ([id number]) -------------------------------------------------------------------------------- Wound Assessment Details Patient Name: [name] Date of Service: 09/18/2015 3:00 PM Medical Record Number: [id number] Patient Account Number: [id number] Date of Birth/Sex: 1927-09-10 (82 y.o. Female) Treating RN: Ashok Cordia, Debi Primary Care Physician: Loney Laurence Other Clinician: Referring Physician: Loney Laurence Treating Physician/Extender: Rudene Re in Treatment: 29 Wound Status Wound Number: 1 Primary Pressure Ulcer Etiology: Wound Location: Left Calcaneous Wound Open Wounding Event: Pressure Injury Status: Date Acquired: 01/30/2015 Comorbid Anemia, Hypertension, Type II Weeks Of Treatment: 29 History: Diabetes, History of pressure wounds, Clustered Wound: No Osteoarthritis, Dementia Photos Photo Uploaded By: Alejandro Mulling on 09/18/2015 16:36:26 Wound Measurements Length: (cm) 0 % Reduct Width: (cm) 0 % Reduct Depth: (cm) 0 Epitheli Area: (cm) 0  Tunneli Volume: (cm) 0 Undermi ion in Area: 100% ion in Volume: 100% alization: Large (67-100%) ng: No ning: No Wound Description  Classification: Unstageable/Unclassified Foul Od Diabetic Severity (Wagner): Grade 1 Wound Margin: Indistinct, nonvisible Exudate Amount: None Present or After Cleansing: No Wound Bed Granulation Amount: None Present (0%) Exposed Structure Necrotic Amount: None Present (0%) Fascia Exposed: No Fat Layer Exposed: No Tendon Exposed: No Muscle Exposed: No Sundstrom, Daleena ([id number]) Joint Exposed: No Bone Exposed: No Limited to Skin Breakdown Periwound Skin Texture Texture Color No Abnormalities Noted: No No Abnormalities Noted: No Callus: No Atrophie Blanche: No Crepitus: No Cyanosis: No Excoriation: No Ecchymosis: No Fluctuance: No Erythema: Yes Friable: No Erythema Location: Circumferential Induration: No Hemosiderin Staining: No Localized Edema: No Mottled: No Rash: No Pallor: No Scarring: No Rubor: No Moisture Temperature / Pain No Abnormalities Noted: No Temperature: No Abnormality Dry / Scaly: No Tenderness on Palpation: Yes Maceration: No Moist: No Wound Preparation Ulcer Cleansing: Rinsed/Irrigated with Saline Topical Anesthetic Applied: None Electronic Signature(s) Signed: 09/18/2015 4:42:35 PM By: Alejandro MullingPinkerton, Debra Entered By: Alejandro MullingPinkerton, Debra on 09/18/2015 15:17:34 Satz, Loleta ([id number]) -------------------------------------------------------------------------------- Vitals Details Patient Name: Gloria FateMORDECAI, Meryn Date of Service: 09/18/2015 3:00 PM Medical Record Number: [id number] Patient Account Number: [id number] Date of Birth/Sex: 03-25-1928 (82 y.o. Female) Treating RN: Ashok CordiaPinkerton, Debi Primary Care Physician: Loney LaurenceAMPBELL, MARGARET Other Clinician: Referring Physician: Loney LaurenceAMPBELL, MARGARET Treating Physician/Extender: Rudene ReBritto, Errol Weeks in Treatment: 29 Vital Signs Time Taken: 14:57 Temperature (F):  98.3 Height (in): 64 Pulse (bpm): 78 Respiratory Rate (breaths/min): 16 Blood Pressure (mmHg): 136/56 Reference Range: 80 - 120 mg / dl Electronic Signature(s) Signed: 09/18/2015 4:42:35 PM By: Alejandro MullingPinkerton, Debra Entered By: Alejandro MullingPinkerton, Debra on 09/18/2015 15:00:32

## 2015-09-22 NOTE — Progress Notes (Signed)
ZNYA, ALBINO ([id number]) Visit Report for 09/18/2015 Chief Complaint Document Details Patient Name: [name], [name] Date of Service: 09/18/2015 3:00 PM Medical Record Number: [id number] Patient Account Number: [id number] Date of Birth/Sex: 12/29/1927 (83 y.o. Female) Treating RN: [name], [name] Primary Care Physician: [name] Other Clinician: Referring Physician: Loney [name] Treating Physician/Extender: [name] in Treatment: 29 Information Obtained from: Patient Chief Complaint patient is back to see [place] today for diabetic foot ulcer which she's had for several months. Electronic Signature(s) Signed: 09/18/2015 3:23:43 PM By: Gloria Kanner MD, FACS Entered By: [name] on 09/18/2015 15:23:43 [name] ([id number]) -------------------------------------------------------------------------------- HPI Details Patient Name: [name] Date of Service: 09/18/2015 3:00 PM Medical Record Number: [id number] Patient Account Number: [id number] Date of Birth/Sex: 06/21/1927 (82 y.o. Female) Treating RN: [name], [name] Primary Care Physician: [name] Other Clinician: Referring Physician: Loney [name] Treating Physician/Extender: [name] in Treatment: 29 History of Present Illness Location: left heel and dorsum of the foot Quality: Patient reports No Pain. Severity: Patient states wound (s) are getting better. Duration: the patient has had surgery in early December and had vascular testing ordered but not done. Timing: Pain in wound is Intermittent (comes and goes Context: The wound appeared gradually over time Modifying Factors: Other treatment(s) tried include: local dressings and offloading. surgery done by Dr. Graciela [name] for debridement of the left heel wound Associated Signs and Symptoms: Patient reports having difficulty standing for long periods. HPI Description: 82 year old patient with past medical history  of Alzheimer's dementia, diabetes mellitus, hypertension and recent history of right hip arthroplasty in August 2016. Her past medical history is significant also for hypothyroidism, hypertension, urinary retention, dementia. 04/11/2015 -- she has had a lot of pain and left heel and there has been a lot of drainage today with purulent material. He has not been running fever. 04/18/2015 -- last week she was here with a florid infection of her left heel and I had sent her to the hospital for admission. She was admitted between 12/ 2 and 12/ 5 and she underwent a debridement of the left heel abscess with placement of antibiotic beads with vancomycin, by Dr. Linus [name]. Her cultures grew gram- negative bacteria, was given vancomycin and Unasyn IV in hospital and she was continued on oral antibiotics and was given documented for 10 days. at the time of discharge Dr. Alberteen [name] wanted the dressing to be intact for about a week before it would need changing. 05/29/2014 -- she has not been to see [place] for the last 6 weeks and from what I understand she has been in rehabilitation facility. No vascular testing was done as we had requested. She has a diabetic foot ulcer which was graded as a Wagner 3 when she was seen last about a month and a half ago. There is no family member to discuss hyperbaric oxygen therapy and the patient has as Alzheimer's dementia and I do not believe she understands our discussions 06/12/2015 -- a vascular appointment is pending for tomorrow. 06/19/2015 -- the vascular workup revealed that the left lower extremity had a 50-99% stenosis in the left superficial femoral, popliteal and tibioperoneal trunk. 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 Dr. Wyn [name] has recommended angiographically and possible stent placement for this critical ischemia. 07/03/2015 -- he is going to have a stent placement done by Dr. Wyn [name] this coming Monday. 07/11/2015 -- she was taken for an  aortogram and found to have diffuse disease SFA with a high-grade near occlusive stenosis  in the distal SFA just above Hunter's canal. One-vessel runoff being the wound area and to get tibial artery at the proximal anterior tibial artery had a short segment occlusion in the distal anterior tibial artery and a moderate to high-grade disease of about 10-12 cm. she had a percutaneous angioplasty of the distal and middle left anterior tibial artery, proximal left anterior tibial artery and distal SFA and proximal popliteal artery. [name] ([id number]) Electronic Signature(s) Signed: 09/18/2015 3:23:50 PM By: Gloria Kanner MD, FACS Entered By: [name] on 09/18/2015 15:23:50 [name], [name] ([id number]) -------------------------------------------------------------------------------- Physical Exam Details Patient Name: [name] Date of Service: 09/18/2015 3:00 PM Medical Record Number: [id number] Patient Account Number: [id number] Date of Birth/Sex: 06-26-1927 (82 y.o. Female) Treating RN: [name], [name] Primary Care Physician: [name] Other Clinician: Referring Physician: Loney [name] Treating Physician/Extender: [name] in Treatment: 29 Constitutional . Pulse regular. Respirations normal and unlabored. Afebrile. . Eyes Nonicteric. Reactive to light. Ears, Nose, Mouth, and Throat Lips, teeth, and gums WNL.Marland Kitchen Moist mucosa without lesions. Neck supple and nontender. No palpable supraclavicular or cervical adenopathy. Normal sized without goiter. Respiratory WNL. No retractions.. Breath sounds WNL, No rubs, rales, rhonchi, or wheeze.. Cardiovascular Heart rhythm and rate regular, no murmur or gallop.. Pedal Pulses WNL. No clubbing, cyanosis or edema. Chest Breasts symmetical and no nipple discharge.. Breast tissue WNL, no masses, lumps, or tenderness.. Lymphatic No adneopathy. No adenopathy. No adenopathy. Musculoskeletal Adexa without  tenderness or enlargement.. Digits and nails w/o clubbing, cyanosis, infection, petechiae, ischemia, or inflammatory conditions.. Integumentary (Hair, Skin) No suspicious lesions. No crepitus or fluctuance. No peri-wound warmth or erythema. No masses.Marland Kitchen Psychiatric Judgement and insight Intact.. No evidence of depression, anxiety, or agitation.. Notes the wound is dry with a supple scar and there is no evidence of any open ulceration. There was no drainage on the dressing. Electronic Signature(s) Signed: 09/18/2015 3:24:25 PM By: Gloria Kanner MD, FACS Entered By: [name] on 09/18/2015 15:24:24 [name] ([id number]) -------------------------------------------------------------------------------- Physician Orders Details Patient Name: [name] Date of Service: 09/18/2015 3:00 PM Medical Record Number: [id number] Patient Account Number: [id number] Date of Birth/Sex: [birth year] (82 y.o. Female) Treating RN: [name], [name] Primary Care Physician: [name] Other Clinician: Referring Physician: Loney [name] Treating Physician/Extender: [name] in Treatment: 85 Verbal / Phone Orders: Yes Clinician: Ashok [name], [name] Read Back and Verified: Yes Diagnosis Coding Wound Cleansing o Cleanse wound with mild soap and water Skin Barriers/Peri-Wound Care o Skin Prep Secondary Dressing o Dry Gauze o Boardered Foam Dressing Dressing Change Frequency o Change dressing every other day. Edema Control o Elevate legs to the level of the heart and pump ankles as often as possible Off-Loading o Heel suspension boot to: - float heels while lying in bed, pt to wear her sage boots o Turn and reposition every 2 hours Home Health o Continue Home Health Visits - for another 3 weeks to monitor heel and do dressing changes if wound starts to drain please let facility [facility]) know so she can set up apt here at wound clinic o Home Health  Nurse may visit PRN to address patientos wound care needs. o FACE TO FACE ENCOUNTER: MEDICARE and MEDICAID PATIENTS: I certify that this patient is under my care and that I had a face-to-face encounter that meets the physician face-to-face encounter requirements with this patient on this date. The encounter with the patient was in whole or in part for the following MEDICAL CONDITION: (primary reason for Home Healthcare) MEDICAL NECESSITY: I certify, that  based on my findings, NURSING services are a medically necessary home health service. HOME BOUND STATUS: I certify that my clinical findings support that this patient is homebound (i.e., Due to illness or injury, pt requires aid of supportive devices such as crutches, cane, wheelchairs, walkers, the use of special transportation or the assistance of another person to leave their place of residence. There is a normal inability to leave the home and doing so requires considerable and taxing effort. Other absences are for medical reasons / religious services and are infrequent or of short duration when for other reasons). o If current dressing causes regression in wound condition, may D/C ordered dressing product/s and apply Normal Saline Moist Dressing daily until next Wound Healing Center / Other MD appointment. Notify Wound Healing Center of regression in wound condition at [phone]. o Please direct any NON-WOUND related issues/requests for orders to patient's Primary Care Physician [name], [name] ([id number]) Discharge From [facility] o Discharge from Wound Care Center - Please call if you have any questions. If wound starts to drain please make an appt. Electronic Signature(s) Signed: 09/18/2015 4:42:35 PM By: Alejandro Mulling Signed: 09/19/2015 7:56:06 AM By: Gloria Kanner MD, FACS Previous Signature: 09/18/2015 4:06:04 PM Version By: Gloria Kanner MD, FACS Entered By: Alejandro Mulling on 09/18/2015 16:25:24 [name] ([id number]) -------------------------------------------------------------------------------- Problem List Details Patient Name: [name] Date of Service: 09/18/2015 3:00 PM Medical Record Number: [id number] Patient Account Number: [id number] Date of Birth/Sex: [birth year] (82 y.o. 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 05/30/2015 Yes 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 08/08/2015 Yes of other part of foot Inactive Problems Resolved Problems ICD-10 Code Description Active Date Resolved Date L89.521 Pressure ulcer of left ankle, stage 1 02/27/2015 02/27/2015 L03.116 Cellulitis of left lower limb 04/11/2015 04/11/2015 Electronic Signature(s) Signed: 09/18/2015 3:23:37 PM By: Gloria Kanner MD, FACS Entered By: [name] on 09/18/2015 15:23:36 [name], [name] ([id number]) [name], [name] ([id number]) -------------------------------------------------------------------------------- Progress Note Details Patient Name: [name] Date of Service: 09/18/2015 3:00 PM Medical Record Number: [id number] Patient Account Number: [id number] Date of Birth/Sex: 1928-03-28 (82 y.o. Female) Treating RN: [name], [name] Primary Care Physician: [name] Other Clinician: Referring Physician: Loney [name] Treating Physician/Extender: [name] in Treatment: 29 Subjective Chief Complaint Information obtained from  Patient patient is back to see [place] today for diabetic foot ulcer which she's had for several months. History of Present Illness (HPI) The following HPI elements were documented for the patient's wound: Location: left heel and dorsum of the foot Quality: Patient reports No Pain. Severity: Patient states wound (s) are getting better. Duration: the patient has had surgery in early December and had vascular testing ordered but not done. Timing: Pain in wound is Intermittent (comes and goes Context: The wound appeared gradually over time Modifying Factors: Other treatment(s) tried include: local dressings and offloading. surgery done by Dr. Graciela [name] for debridement of the left heel wound Associated Signs and Symptoms: Patient reports having difficulty standing for long periods. 82 year old patient with past medical history of Alzheimer's dementia, diabetes mellitus, hypertension and recent history of right hip arthroplasty in August 2016. Her past  medical history is significant also for hypothyroidism, hypertension, urinary retention, dementia. 04/11/2015 -- she has had a lot of pain and left heel and there has been a lot of drainage today with purulent material. He has not been running fever. 04/18/2015 -- last week she was here with a florid infection of her left heel and I had sent her to the hospital for admission. She was admitted between 12/ 2 and 12/ 5 and she underwent a debridement of the left heel abscess with placement of antibiotic beads with vancomycin, by Dr. Linus [name]. Her cultures grew gram- negative bacteria, was given vancomycin and Unasyn IV in hospital and she was continued on oral antibiotics and was given documented for 10 days. at the time of discharge Dr. Alberteen [name] wanted the dressing to be intact for about a week before it would need changing. 05/29/2014 -- she has not been to see [place] for the last 6 weeks and from what I understand she has been in rehabilitation facility. No  vascular testing was done as we had requested. She has a diabetic foot ulcer which was graded as a Wagner 3 when she was seen last about a month and a half ago. There is no family member to discuss hyperbaric oxygen therapy and the patient has as Alzheimer's dementia and I do not believe she understands our discussions 06/12/2015 -- a vascular appointment is pending for tomorrow. 06/19/2015 -- the vascular workup revealed that the left lower extremity had a 50-99% stenosis in the left superficial femoral, popliteal and tibioperoneal trunk. 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 Dr. Wyn [name] has recommended angiographically and possible stent placement for this critical ischemia. [name], [name] ([id number]) 07/03/2015 -- he is going to have a stent placement done by Dr. Wyn [name] this coming Monday. 07/11/2015 -- she was taken for an aortogram and found to have diffuse disease SFA with a high-grade near occlusive stenosis in the distal SFA just above Hunter's canal. One-vessel runoff being the wound area and to get tibial artery at the proximal anterior tibial artery had a short segment occlusion in the distal anterior tibial artery and a moderate to high-grade disease of about 10-12 cm. she had a percutaneous angioplasty of the distal and middle left anterior tibial artery, proximal left anterior tibial artery and distal SFA and proximal popliteal artery. Objective Constitutional Pulse regular. Respirations normal and unlabored. Afebrile. Vitals Time Taken: 2:57 PM, Height: 64 in, Temperature: 98.3 F, Pulse: 78 bpm, Respiratory Rate: 16 breaths/min, Blood Pressure: 136/56 mmHg. Eyes Nonicteric. Reactive to light. Ears, Nose, Mouth, and Throat Lips, teeth, and gums WNL.Marland Kitchen Moist mucosa without lesions. Neck supple and nontender. No palpable supraclavicular or cervical adenopathy. Normal sized without goiter. Respiratory WNL. No retractions.. Breath sounds WNL, No rubs,  rales, rhonchi, or wheeze.. Cardiovascular Heart rhythm and rate regular, no murmur or gallop.. Pedal Pulses WNL. No clubbing, cyanosis or edema. Chest Breasts symmetical and no nipple discharge.. Breast tissue WNL, no masses, lumps, or tenderness.. Lymphatic No adneopathy. No adenopathy. No adenopathy. Musculoskeletal Adexa without tenderness or enlargement.. Digits and nails w/o clubbing, cyanosis, infection, petechiae, ischemia, or inflammatory conditions.Marland Kitchen Psychiatric Judgement and insight Intact.. No evidence of depression, anxiety, or agitation.Marland Kitchen [name], [name] ([id number]) General Notes: the wound is dry with a supple scar and there is no evidence of any open ulceration. There was no drainage on the dressing. Integumentary (Hair, Skin) No suspicious lesions. No crepitus or fluctuance. No peri-wound warmth or erythema. No masses.. Wound #1 status is  Open. Original cause of wound was Pressure Injury. The wound is located on the Left Calcaneous. The wound measures 0cm length x 0cm width x 0cm depth; 0cm^2 area and 0cm^3 volume. The wound is limited to skin breakdown. There is no tunneling or undermining noted. There is a none present amount of drainage noted. The wound margin is indistinct and nonvisible. There is no granulation within the wound bed. There is no necrotic tissue within the wound bed. The periwound skin appearance exhibited: Erythema. The periwound skin appearance did not exhibit: Callus, Crepitus, Excoriation, Fluctuance, Friable, Induration, Localized Edema, Rash, Scarring, Dry/Scaly, Maceration, Moist, Atrophie Blanche, Cyanosis, Ecchymosis, Hemosiderin Staining, Mottled, Pallor, Rubor. The surrounding wound skin color is noted with erythema which is circumferential. Periwound temperature was noted as No Abnormality. The periwound has tenderness on palpation. Assessment Active Problems ICD-10 E11.621 - Type 2 diabetes mellitus with foot ulcer L89.620 - Pressure  ulcer of left heel, unstageable G30.9 - Alzheimer's disease, unspecified L97.422 - Non-pressure chronic ulcer of left heel and midfoot with fat layer exposed I70.245 - Atherosclerosis of native arteries of left leg with ulceration of other part of foot Her wound has healed nicely and there is a supple scar with no open ulceration. I have asked the caregivers to continue applying a bordered foam and watch carefully for drainage. If she continues to have no drainage we do not need to see her back. Offloading has been discussed in great detail and she is discharged from the wound care services and will be seen back as needed Plan Wound Cleansing: Cleanse wound with mild soap and water [name], [name] ([id number]) Skin Barriers/Peri-Wound Care: Skin Prep Secondary Dressing: Dry Gauze Boardered Foam Dressing Dressing Change Frequency: Change dressing every other day. Edema Control: Elevate legs to the level of the heart and pump ankles as often as possible Off-Loading: Heel suspension boot to: - float heels while lying in bed, pt to wear her sage boots Turn and reposition every 2 hours Home Health: Continue Home Health Visits - for another 3 weeks to monitor heel and do dressing changes if wound starts to drain please let facility [facility]) know so she can set up apt here at wound clinic Home Health Nurse may visit PRN to address patient s wound care needs. FACE TO FACE ENCOUNTER: MEDICARE and MEDICAID PATIENTS: I certify that this patient is under my care and that I had a face-to-face encounter that meets the physician face-to-face encounter requirements with this patient on this date. The encounter with the patient was in whole or in part for the following MEDICAL CONDITION: (primary reason for Home Healthcare) MEDICAL NECESSITY: I certify, that based on my findings, NURSING services are a medically necessary home health service. HOME BOUND STATUS: I certify that my clinical findings  support that this patient is homebound (i.e., Due to illness or injury, pt requires aid of supportive devices such as crutches, cane, wheelchairs, walkers, the use of special transportation or the assistance of another person to leave their place of residence. There is a normal inability to leave the home and doing so requires considerable and taxing effort. Other absences are for medical reasons / religious services and are infrequent or of short duration when for other reasons). If current dressing causes regression in wound condition, may D/C ordered dressing product/s and apply Normal Saline Moist Dressing daily until next Wound Healing Center / Other MD appointment. Notify Wound Healing Center of regression in wound condition at [phone]. Please direct any NON-WOUND related issues/requests  for orders to patient's Primary Care Physician [facility]charge from [facility] Center - Please call if you have any questions. If wound starts to drain please make an appt. Her wound has healed nicely and there is a supple scar with no open ulceration. I have asked the caregivers to continue applying a bordered foam and watch carefully for drainage. If she continues to have no drainage we do not need to see her back. Offloading has been discussed in great detail and she is discharged from the wound care services and will be seen back as needed Electronic Signature(s) Signed: 09/19/2015 7:56:59 AM By: Gloria KannerBritto, Sherrey North MD, FACS Previous Signature: 09/18/2015 3:25:42 PM Version By: Gloria KannerBritto, Yuepheng Schaller MD, FACS [place] Lake[place]ORDECAI, [name] ([id number]) Entered By: Gloria KannerBritto, Regine Christian on 09/19/2015 07:56:59 [name], Gloria MutterBLOSSOM ([id number]) -------------------------------------------------------------------------------- SuperBill Details Patient Name: Gloria FateMORDECAI, Gray Date of Service: 09/18/2015 Medical Record Number: [id number] Patient Account Number: [id number] Date of Birth/Sex: 1927-09-14 (82 y.o.  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 of other part of foot Facility Procedures CPT4 Code: [id number] Description: 99213 - WOUND CARE VISIT-LEV 3 EST PT Modifier: Quantity: 1 Physician Procedures CPT4: Description Modifier Quantity Code [id number] 99212 - WC PHYS LEVEL 2 - EST PT 1 ICD-10 Description Diagnosis E11.621 Type 2 diabetes mellitus with foot ulcer L89.620 Pressure ulcer of left heel, unstageable L97.422 Non-pressure chronic ulcer of left  heel and midfoot with fat 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 of other part of foot Electronic Signature(s) Signed: 09/18/2015 4:42:35 PM By: Alejandro MullingPinkerton, Debra Signed: 09/19/2015 7:56:06 AM By: Gloria KannerBritto, Murriel Eidem MD, FACS Previous Signature: 09/18/2015 3:26:13 PM Version By: Gloria KannerBritto, Modesty Rudy MD, FACS Entered By: Alejandro MullingPinkerton, Debra on 09/18/2015 16:27:02

## 2015-10-13 ENCOUNTER — Encounter: Payer: Self-pay | Admitting: *Deleted

## 2016-05-05 ENCOUNTER — Other Ambulatory Visit (INDEPENDENT_AMBULATORY_CARE_PROVIDER_SITE_OTHER): Payer: Self-pay | Admitting: Vascular Surgery

## 2016-05-05 DIAGNOSIS — I739 Peripheral vascular disease, unspecified: Secondary | ICD-10-CM

## 2016-05-06 ENCOUNTER — Ambulatory Visit (INDEPENDENT_AMBULATORY_CARE_PROVIDER_SITE_OTHER): Payer: Medicare Other

## 2016-05-06 ENCOUNTER — Ambulatory Visit (INDEPENDENT_AMBULATORY_CARE_PROVIDER_SITE_OTHER): Payer: Self-pay | Admitting: Vascular Surgery

## 2016-05-06 DIAGNOSIS — I739 Peripheral vascular disease, unspecified: Secondary | ICD-10-CM | POA: Diagnosis not present

## 2017-04-16 IMAGING — CR DG HIP (WITH OR WITHOUT PELVIS) 2-3V*R*
3 series · 3 of 3 positions shown · non-contrast
Comparison: None.

CLINICAL DATA: Pain following fall

EXAM:
DG HIP (WITH OR WITHOUT PELVIS) 2-3V RIGHT

[hip ap]
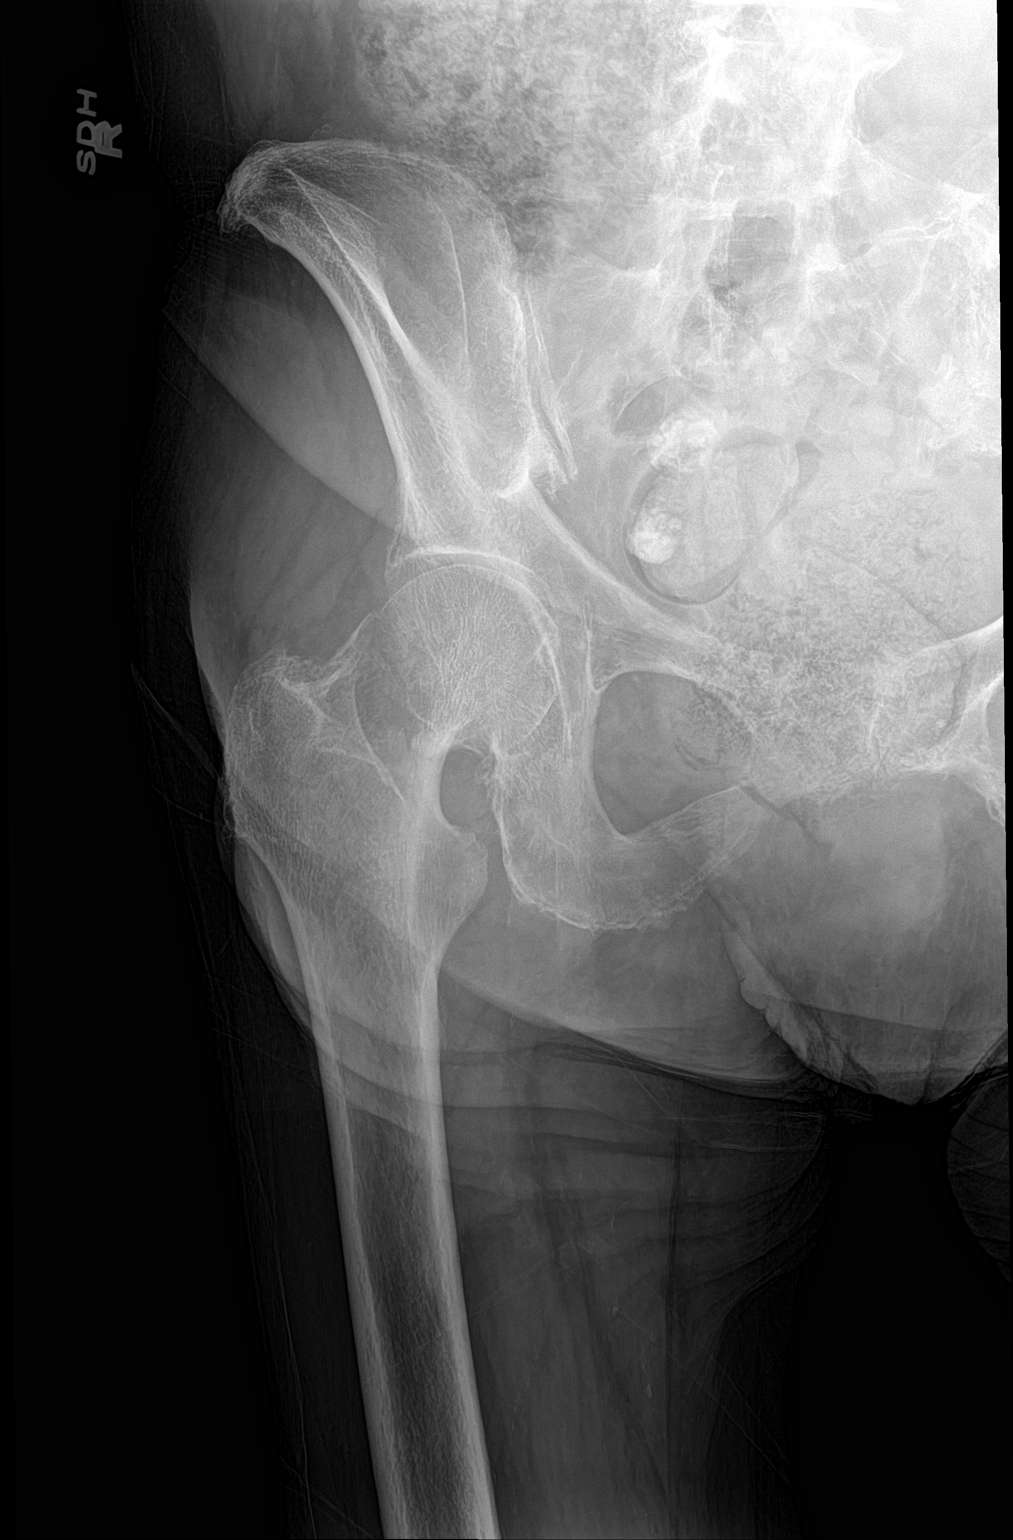

[hip lat]
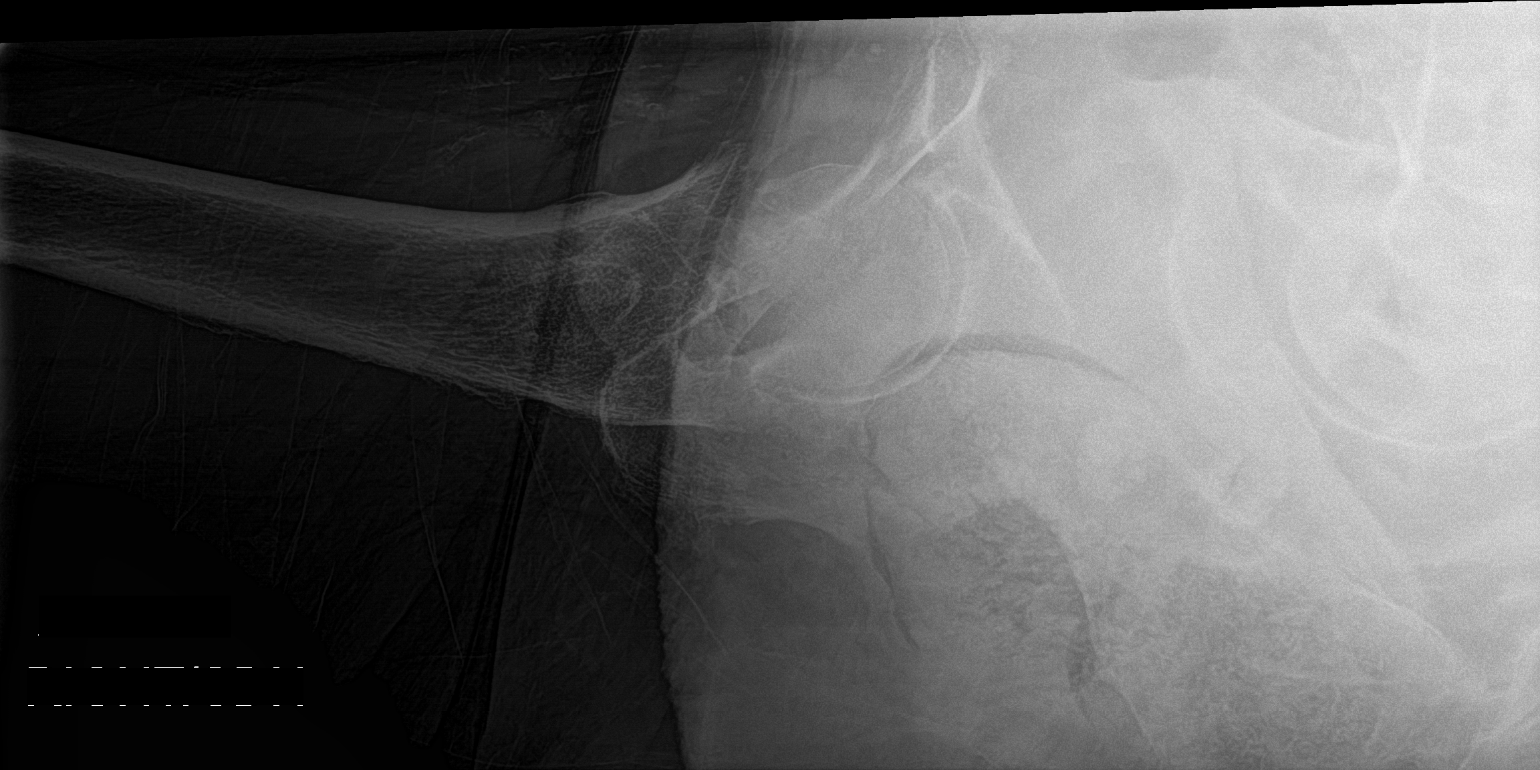

[pelvis ap]
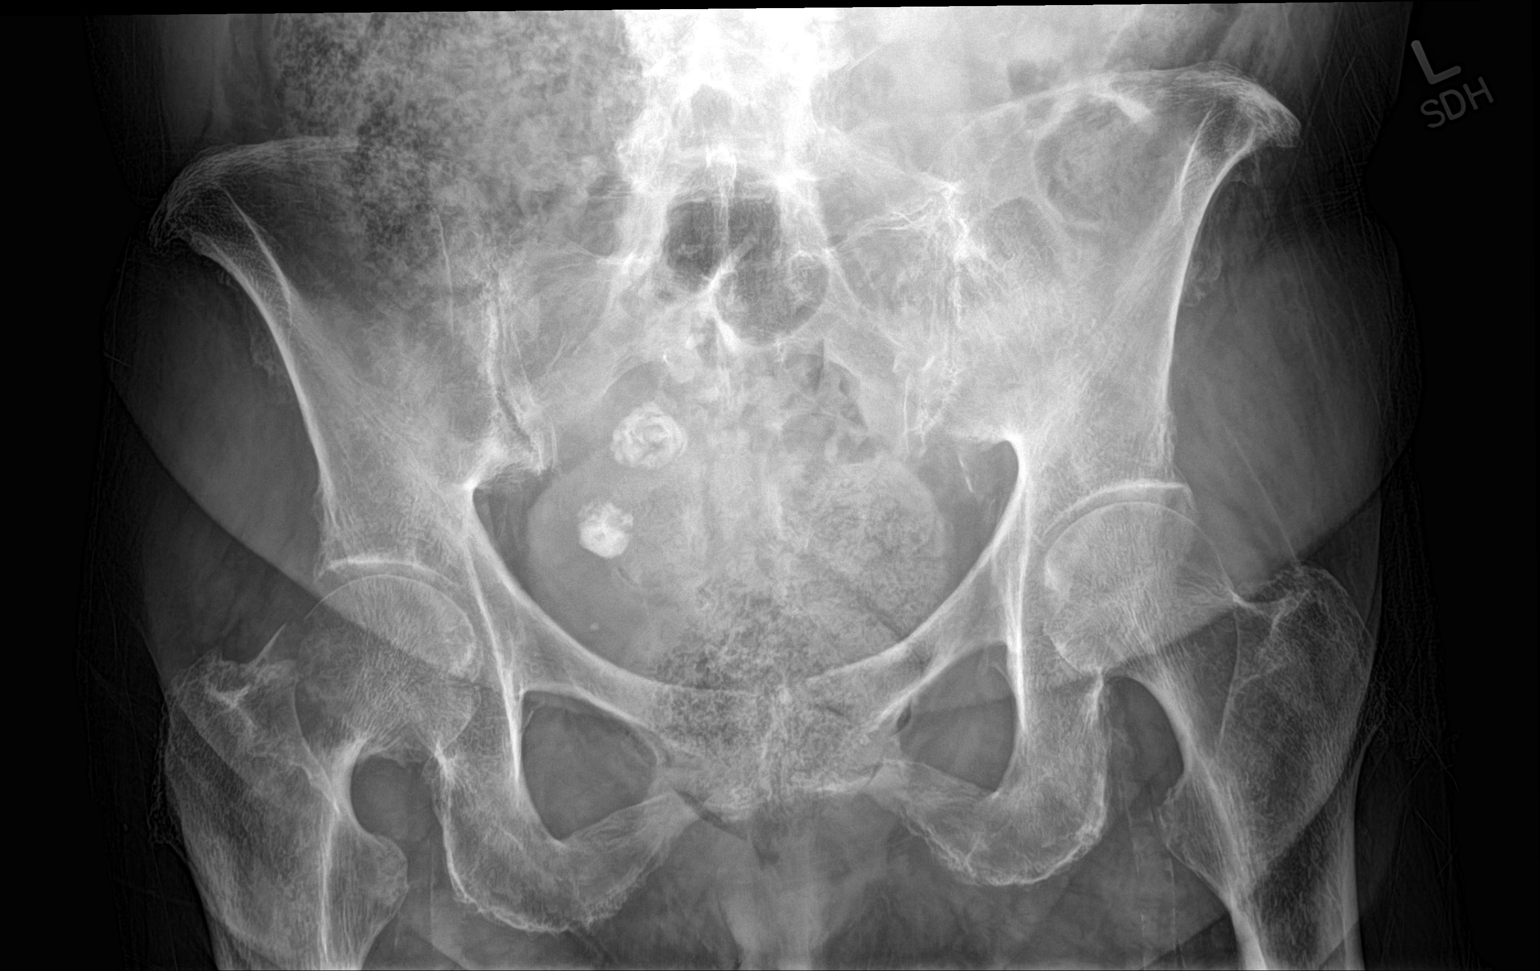

[3 of 3 positions shown; findings below may reference images not displayed]

FINDINGS: Frontal pelvis as well as frontal and lateral right hip images
obtained. There is a subcapital femoral neck fracture on the right
in near anatomic alignment. There is mild impaction in this area. No
dislocation. No other fractures are apparent. There is moderate
narrowing of both hip joints. Bones are osteoporotic.

There are calcified uterine leiomyomas in the right pelvis. Rectum
is borderline distended with stool.
IMPRESSION: Subcapital femoral neck fracture on the right with mild impaction at
the fracture site. Moderate narrowing both hip joints. Bones
osteoporotic. No dislocation.

## 2017-04-17 IMAGING — CR DG HIP (WITH OR WITHOUT PELVIS) 1V PORT*R*
1 series · 2 of 2 positions shown · non-contrast
Comparison: 01/07/2015

CLINICAL DATA: Status post total hip replacement.

EXAM:
DG HIP (WITH OR WITHOUT PELVIS) 1V PORT RIGHT

[Series 1: ap · 0.17mm/px · 2 of 2 slices shown]
[im 1/2]
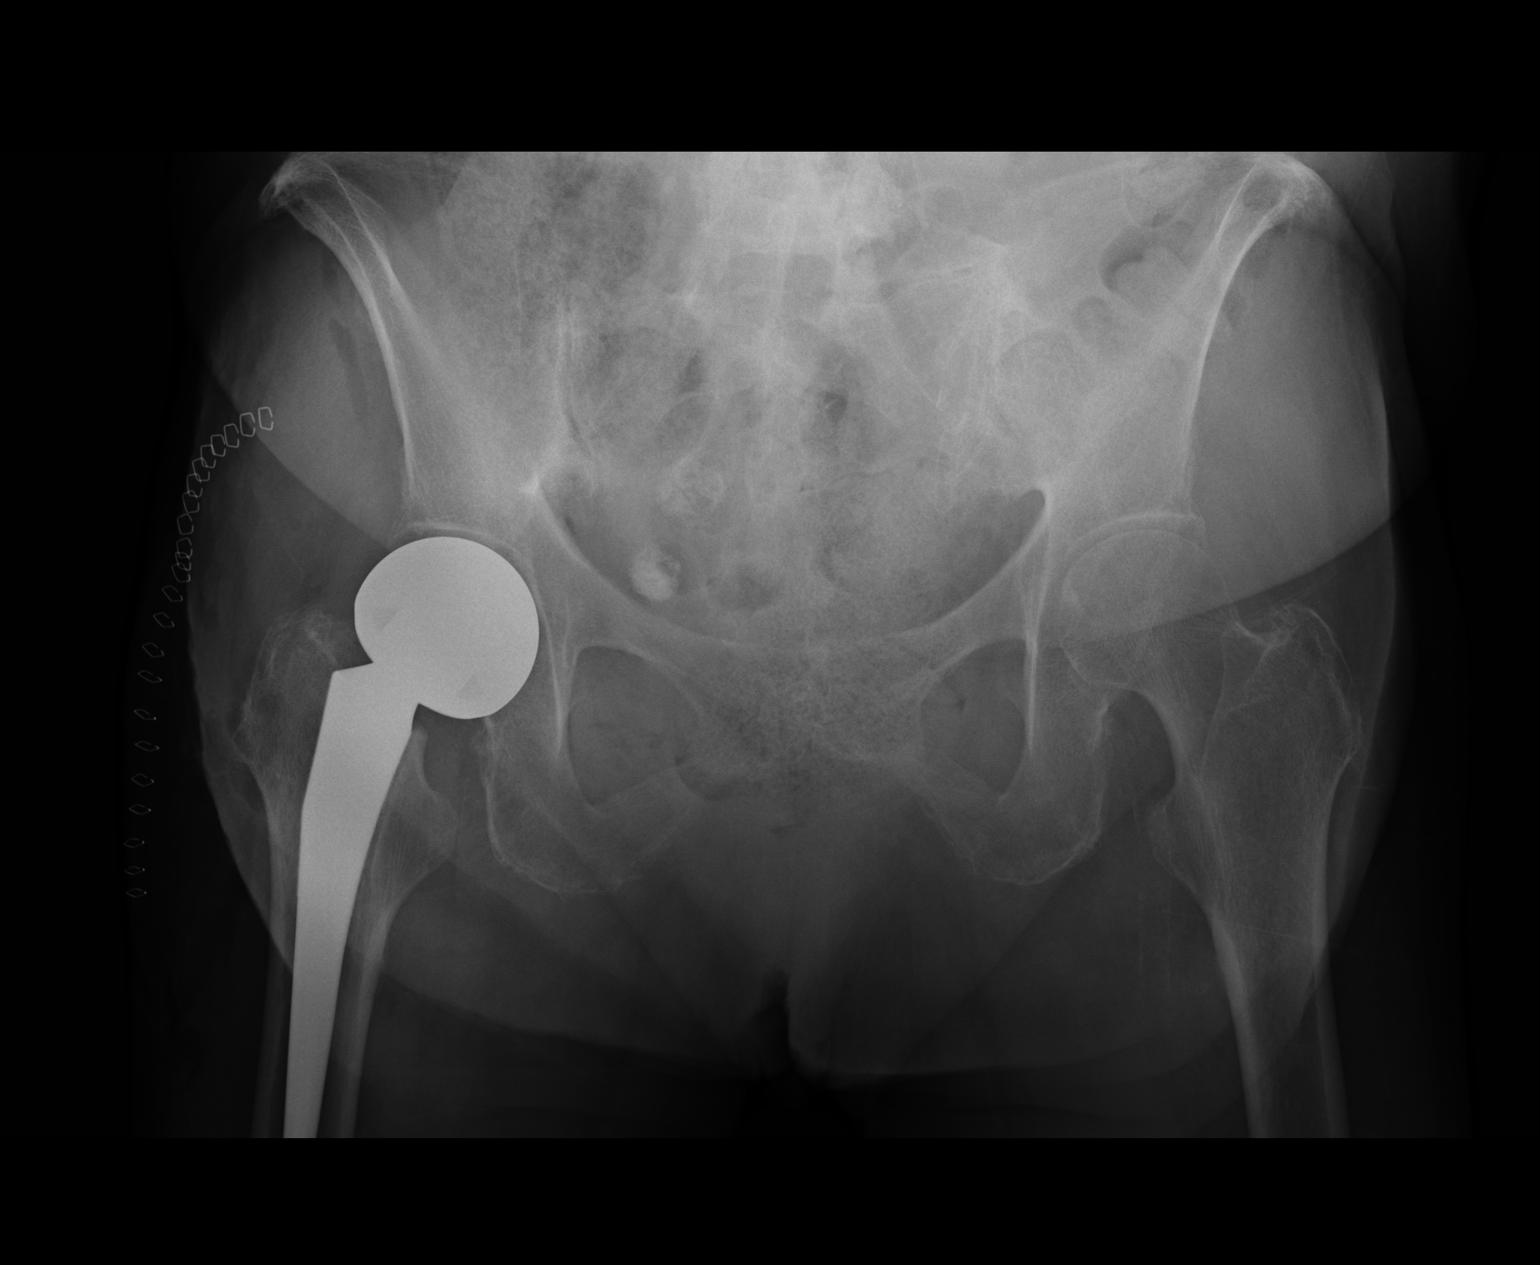
[im 2/2]
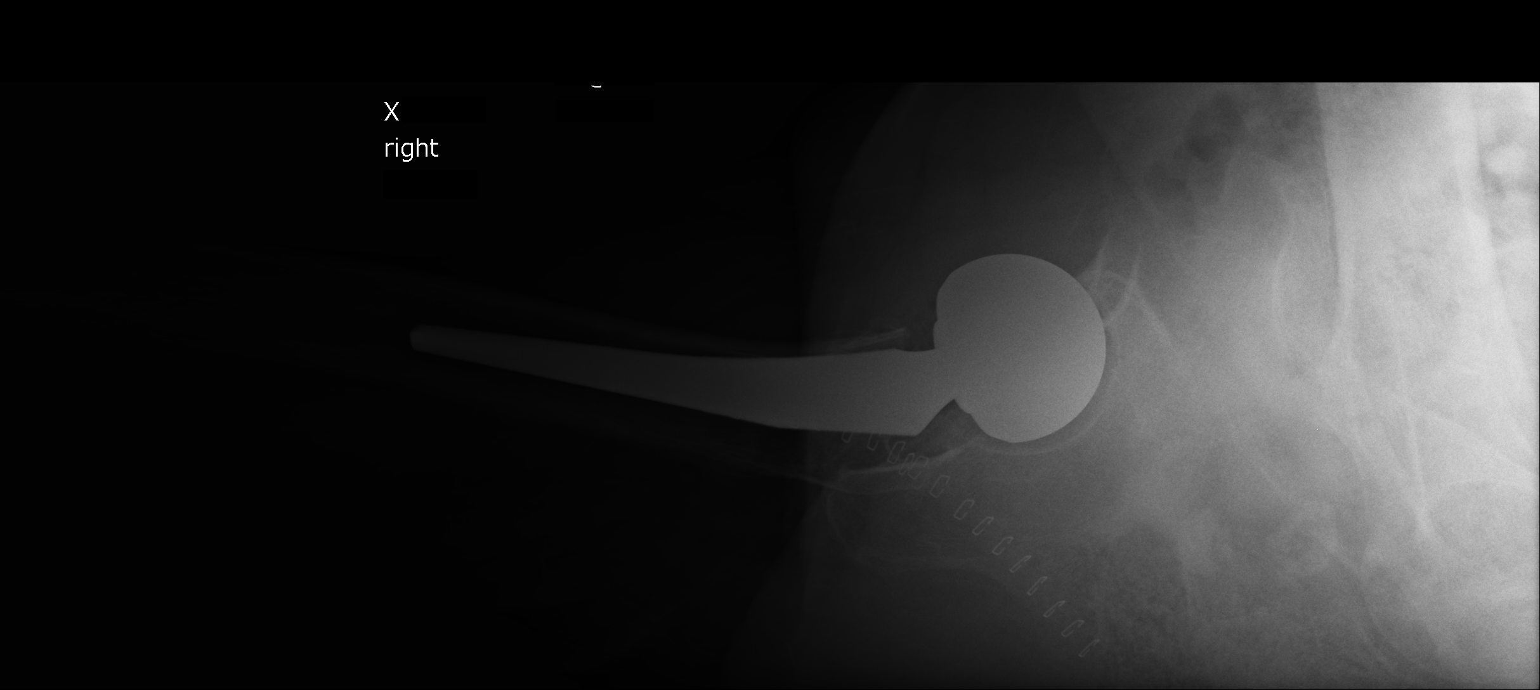

[2 of 2 positions shown; findings below may reference images not displayed]

FINDINGS: There has been interval total right hip arthroplasty, with long stem
femoral component. The previously demonstrated right femoral neck
fracture is no longer seen. The hardware alignment is near anatomic.
Expected soft tissue edema and emphysema, and skin staples are seen.
Calcified fibroids are noted.
IMPRESSION: Status post right total hip arthroplasty, with prosthetic hardware
in good alignment.

## 2017-04-18 IMAGING — CR DG CHEST 2V
2 series · 2 of 2 positions shown · non-contrast
Comparison: 01/07/2015 chest radiograph

CLINICAL DATA: Fever.  Hip surgery 1 day prior.

EXAM:
CHEST  2 VIEW

[chest lat]
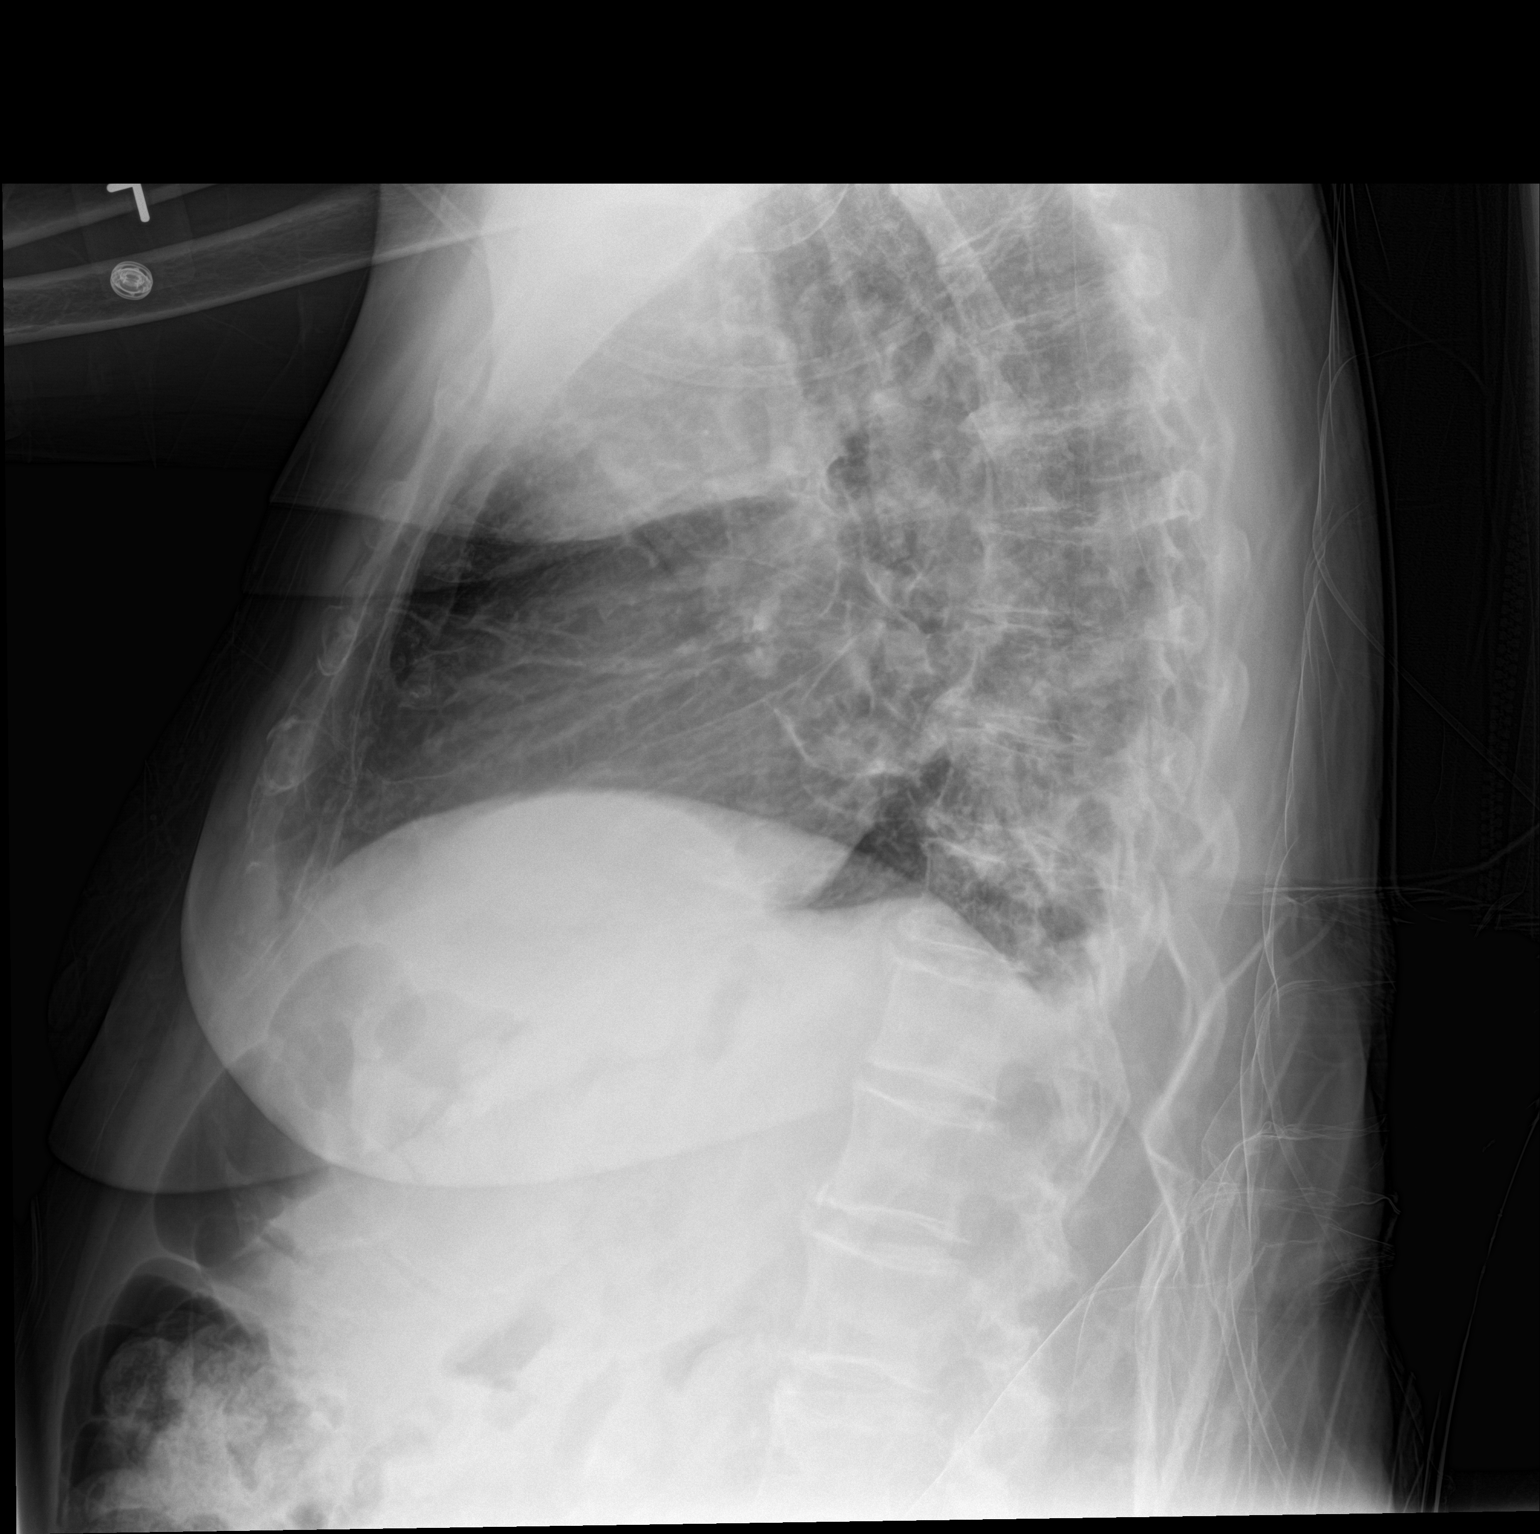

[chest ap]
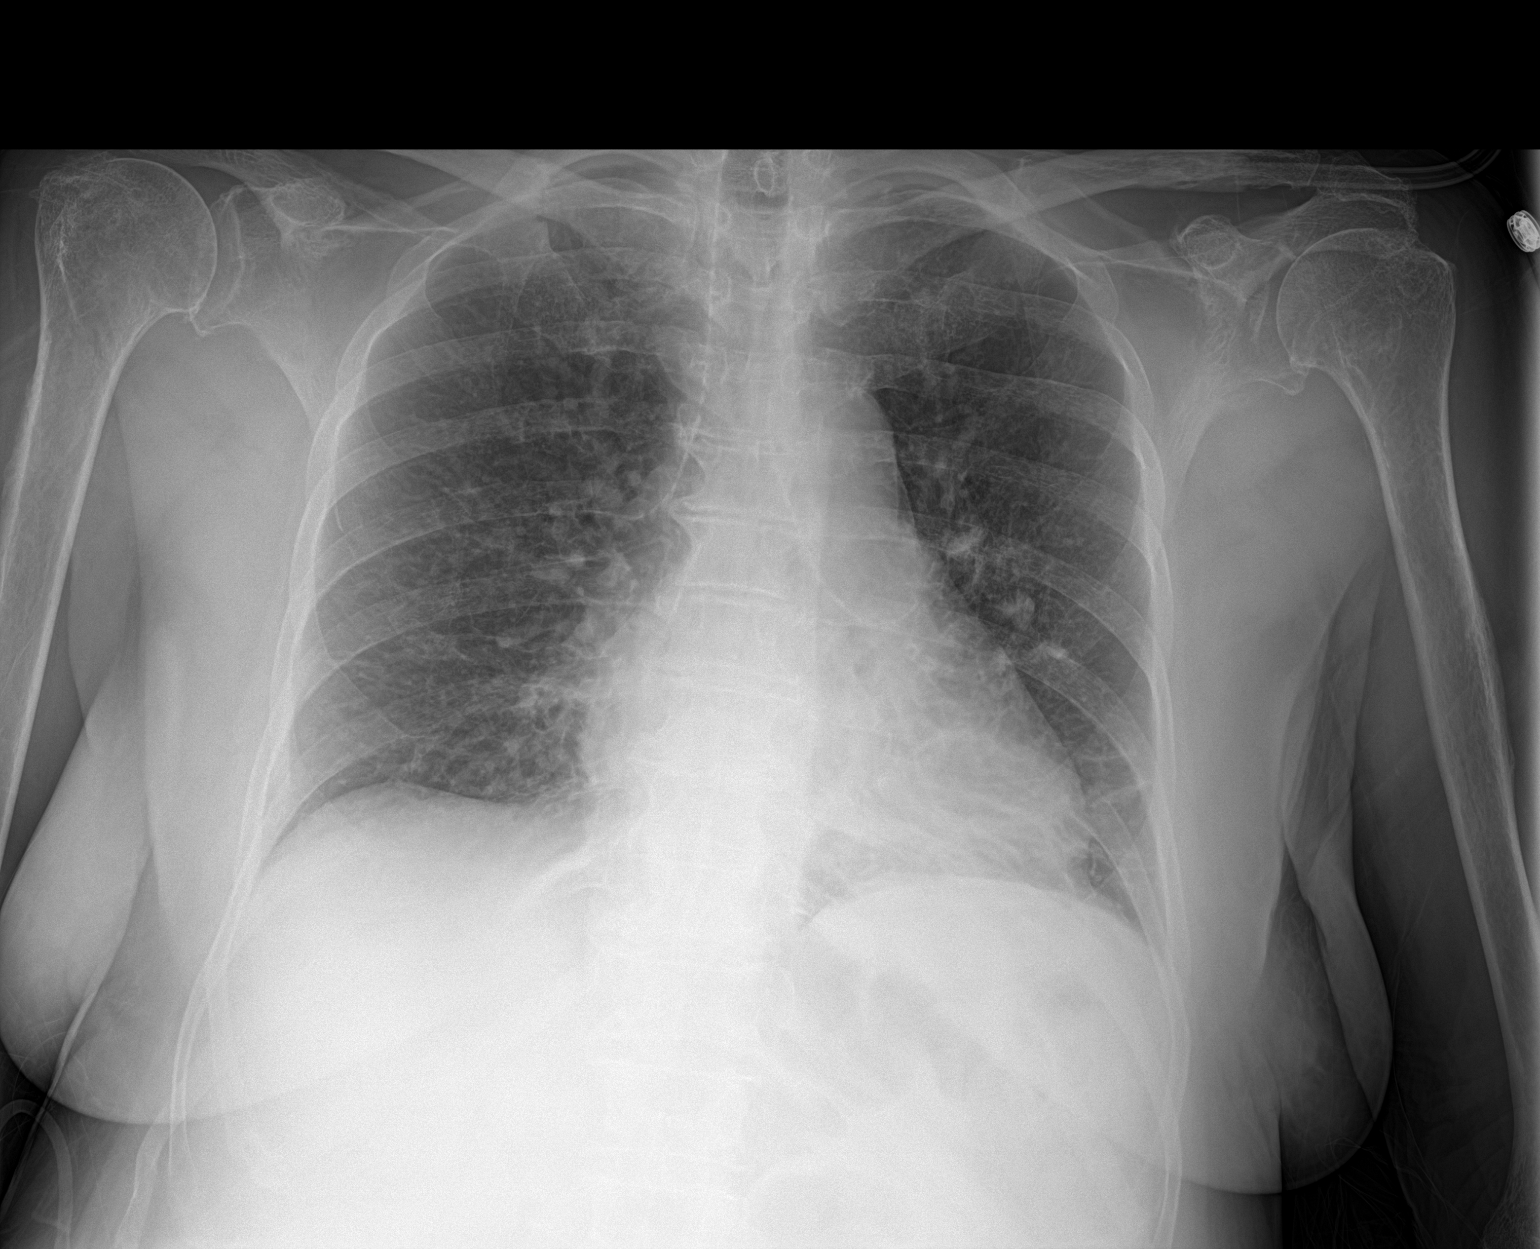

[2 of 2 positions shown; findings below may reference images not displayed]

FINDINGS: Stable cardiomediastinal silhouette with top-normal heart size. No
pneumothorax. No pleural effusion. Curvilinear opacities are seen at
the left greater than right lung bases. No overt pulmonary edema.
Moderate degenerative changes are seen in the thoracic spine.
IMPRESSION: Curvilinear opacities at the left greater than right lung bases,
favor atelectasis.

## 2017-12-16 ENCOUNTER — Emergency Department: Payer: Medicare Other

## 2017-12-16 ENCOUNTER — Emergency Department
Admission: EM | Admit: 2017-12-16 | Discharge: 2017-12-16 | Disposition: A | Discharge status: Home / Self Care | Payer: Medicare Other | Attending: Emergency Medicine | Admitting: Emergency Medicine

## 2017-12-16 DIAGNOSIS — I10 Essential (primary) hypertension: Secondary | ICD-10-CM | POA: Insufficient documentation

## 2017-12-16 DIAGNOSIS — F0390 Unspecified dementia without behavioral disturbance: Secondary | ICD-10-CM | POA: Insufficient documentation

## 2017-12-16 DIAGNOSIS — E119 Type 2 diabetes mellitus without complications: Secondary | ICD-10-CM | POA: Diagnosis not present

## 2017-12-16 DIAGNOSIS — Z79899 Other long term (current) drug therapy: Secondary | ICD-10-CM | POA: Insufficient documentation

## 2017-12-16 DIAGNOSIS — W19XXXA Unspecified fall, initial encounter: Secondary | ICD-10-CM | POA: Insufficient documentation

## 2017-12-16 DIAGNOSIS — Z96641 Presence of right artificial hip joint: Secondary | ICD-10-CM | POA: Diagnosis not present

## 2017-12-16 DIAGNOSIS — Z043 Encounter for examination and observation following other accident: Secondary | ICD-10-CM | POA: Insufficient documentation

## 2017-12-16 DIAGNOSIS — Z794 Long term (current) use of insulin: Secondary | ICD-10-CM | POA: Diagnosis not present

## 2017-12-16 DIAGNOSIS — E039 Hypothyroidism, unspecified: Secondary | ICD-10-CM | POA: Insufficient documentation

## 2017-12-16 NOTE — ED Notes (Signed)
Return from CT scan.  AAOx3  Skin warm and dry. NAD 

## 2017-12-16 NOTE — ED Notes (Signed)
Patient able to stand without c/o pain.

## 2017-12-16 NOTE — ED Notes (Signed)
Awake and alert to baseline.  NAD.  D/C home

## 2017-12-16 NOTE — ED Triage Notes (Signed)
Pt from [facility] for fall Pt complaints of R leg pain. Pt is ambulatory wtih walker, ambulatory to stretcher. Pt is not on blood thinners.

## 2017-12-16 NOTE — ED Provider Notes (Signed)
[facility] Emergency Department Provider Note  Time seen: 7:05 AM  I have reviewed the triage vital signs and the nursing notes.   HISTORY  Chief Complaint Fall    HPI [name] is a 84 y.o. female with a past medical history of dementia, diabetes, hypertension, presents to the emergency department after a fall.  Per EMS report patient comes from [facility] nursing facility.  Had a fall overnight.  Per EMS patient has no complaints, was able to ambulate with her walker from the bed to the stretcher.  [facility] sent for medical evaluation per protocol.   Past Medical History:  Diagnosis Date  . Alzheimer's dementia   . Diabetes type 2, controlled (HCC)   . Hearing impairment   . Hypertension   . Hypothyroidism   . Unsteady gait   . Vision impairment     Patient Active Problem List   Diagnosis Date Noted  . Pressure ulcer 04/12/2015  . Gangrene (HCC) 04/11/2015  . Urinary retention 02/04/2015  . Atelectasis 01/10/2015  . Acute encephalopathy 01/10/2015  . Dementia 01/10/2015  . Closed right hip fracture (HCC) 01/07/2015    Past Surgical History:  Procedure Laterality Date  . HIP ARTHROPLASTY Right 01/08/2015   Procedure: ARTHROPLASTY BIPOLAR HIP (HEMIARTHROPLASTY);  Surgeon: [name], MD;  Location: [facility] ORS;  Service: Orthopedics;  Laterality: Right;  . IRRIGATION AND DEBRIDEMENT FOOT Left 04/12/2015   Procedure: IRRIGATION AND DEBRIDEMENT FOOT;  Surgeon: Linus Galas, MD;  Location: [facility] ORS;  Service: Podiatry;  Laterality: Left;  . NO PAST SURGERIES    . PERIPHERAL VASCULAR CATHETERIZATION Left 07/07/2015   Procedure: Lower Extremity Angiography;  Surgeon: Annice Needy, MD;  Location: [facility] INVASIVE CV LAB;  Service: Cardiovascular;  Laterality: Left;    Prior to Admission medications   Medication Sig Start Date End Date Taking? Authorizing Provider  acetaminophen (TYLENOL) 325 MG tablet Take 325 mg by mouth every 6 (six) hours as  needed for mild pain or moderate pain.    [provider]  amoxicillin-clavulanate (AUGMENTIN) 875-125 MG tablet Take 1 tablet by mouth 2 (two) times daily.    [provider]  aspirin EC 325 MG tablet Take 325 mg by mouth daily. Reported on 07/07/2015    [provider]  aspirin EC 81 MG tablet Take 81 mg by mouth daily.    [provider]  feeding supplement, GLUCERNA SHAKE, (GLUCERNA SHAKE) LIQD Take 237 mLs by mouth 3 (three) times daily with meals. 04/14/15   Auburn Bilberry, MD  ferrous sulfate 325 (65 FE) MG tablet Take 1 tablet (325 mg total) by mouth 2 (two) times daily with a meal. Patient taking differently: Take 325 mg by mouth 3 (three) times daily before meals.  01/10/15   Milagros Loll, MD  HYDROcodone-acetaminophen (NORCO/VICODIN) 5-325 MG tablet Take 1 tablet by mouth every 4 (four) hours as needed for moderate pain. Patient not taking: Reported on 07/07/2015 04/14/15   Auburn Bilberry, MD  insulin aspart (NOVOLOG) 100 UNIT/ML injection Inject into the skin 3 (three) times daily as needed for high blood sugar (per sliding scale).    [provider]  insulin detemir (LEVEMIR) 100 UNIT/ML injection Inject 0.25 mLs (25 Units total) into the skin at bedtime. Patient taking differently: Inject 20 Units into the skin at bedtime.  01/10/15   Milagros Loll, MD  levothyroxine (SYNTHROID, LEVOTHROID) 75 MCG tablet Take 75 mcg by mouth daily.    [provider]  lisinopril (PRINIVIL,ZESTRIL) 5 MG  tablet Take 5 mg by mouth daily.    [provider]  metFORMIN (GLUCOPHAGE) 500 MG tablet Take 500 mg by mouth daily. Reported on 07/07/2015    [provider]  mirtazapine (REMERON) 15 MG tablet Take 15 mg by mouth at bedtime.    [provider]  polyethylene glycol (MIRALAX / GLYCOLAX) packet Take 17 g by mouth daily as needed for mild constipation. 01/10/15   Milagros LollSudini, Srikar, MD  rOPINIRole (REQUIP) 1 MG tablet Take 1 tablet (1  mg total) by mouth at bedtime. 04/14/15   Auburn BilberryPatel, Shreyang, MD  senna-docusate (SENOKOT-S) 8.6-50 MG tablet Take 2 tablets by mouth 2 (two) times daily.    [provider]  sertraline (ZOLOFT) 50 MG tablet Take 50 mg by mouth daily.    [provider]  simvastatin (ZOCOR) 20 MG tablet Take 20 mg by mouth every evening.    [provider]  tamsulosin (FLOMAX) 0.4 MG CAPS capsule Take 1 capsule (0.4 mg total) by mouth daily. 02/03/15   Michiel CowboyMcGowan, Shannon A, PA-C  traZODone (DESYREL) 50 MG tablet Take 50 mg by mouth at bedtime.    [provider]    No Known Allergies  Family History  Family history unknown: Yes    Social History Social History   Tobacco Use  . Smoking status: Never Smoker  Substance Use Topics  . Alcohol use: No  . Drug use: No    Review of Systems Unable to complete an adequate/accurate review of systems secondary to baseline dementia  ____________________________________________   PHYSICAL EXAM:  Constitutional: Alert. Well appearing and in no distress. Eyes: Normal exam ENT   Head: Normocephalic, older appearing bruises to face and head   Mouth/Throat: Mucous membranes are moist. Cardiovascular: Normal rate, regular rhythm. Respiratory: Normal respiratory effort without tachypnea nor retractions. Breath sounds are clear  Gastrointestinal: Soft and nontender. No distention. Musculoskeletal: Nontender with normal range of motion in all extremities. Neurologic:  Normal speech and language. No gross focal neurologic deficits Skin:  Skin is warm, dry and intact.  Psychiatric: Mood and affect are normal.   ____________________________________________     RADIOLOGY  No acute infarct on CT.  No mass or hemorrhage.  EKG reviewed and interpreted by myself shows normal sinus rhythm at 95 bpm with a narrow QRS, normal axis, normal intervals, no concerning ST changes  noted. ____________________________________________   INITIAL IMPRESSION / ASSESSMENT AND PLAN / ED COURSE  Pertinent labs & imaging results that were available during my care of the patient were reviewed by me and considered in my medical decision making (see chart for details).  Patient presents to the emergency department after a fall at her nursing facility.  Here the patient appears well, has no complaints but does have dementia.  Patient does have older appearing bruising to her face and head.  Given the unwitnessed fall we will obtain a CT scan of the head as a precaution.  Otherwise patient is moving all extremities well, no pain to palpation on examination or to range of motion.  Anticipate likely discharge home if CT head negative.  CT negative for acute infarct.  We will discharge back to her nursing facility.  ____________________________________________   FINAL CLINICAL IMPRESSION(S) / ED DIAGNOSES  Gloria RossFall    Gloria Strother, MD 12/16/17 [phone]

## 2017-12-16 NOTE — ED Notes (Signed)
Patient transported to CT 

## 2018-01-27 ENCOUNTER — Encounter: Payer: Self-pay | Admitting: Emergency Medicine

## 2018-01-27 ENCOUNTER — Other Ambulatory Visit: Payer: Self-pay

## 2018-01-27 ENCOUNTER — Emergency Department: Payer: Medicare Other

## 2018-01-27 ENCOUNTER — Inpatient Hospital Stay
Admission: EM | Admit: 2018-01-27 | Discharge: 2018-01-31 | DRG: 535 | Disposition: A | Discharge status: Skilled Nursing Facility | Payer: Medicare Other | Attending: Specialist | Admitting: Specialist

## 2018-01-27 DIAGNOSIS — M9701XA Periprosthetic fracture around internal prosthetic right hip joint, initial encounter: Secondary | ICD-10-CM | POA: Diagnosis present

## 2018-01-27 DIAGNOSIS — Y92129 Unspecified place in nursing home as the place of occurrence of the external cause: Secondary | ICD-10-CM

## 2018-01-27 DIAGNOSIS — K5900 Constipation, unspecified: Secondary | ICD-10-CM | POA: Diagnosis present

## 2018-01-27 DIAGNOSIS — G309 Alzheimer's disease, unspecified: Secondary | ICD-10-CM | POA: Diagnosis present

## 2018-01-27 DIAGNOSIS — E039 Hypothyroidism, unspecified: Secondary | ICD-10-CM | POA: Diagnosis present

## 2018-01-27 DIAGNOSIS — J9811 Atelectasis: Secondary | ICD-10-CM | POA: Diagnosis present

## 2018-01-27 DIAGNOSIS — H9190 Unspecified hearing loss, unspecified ear: Secondary | ICD-10-CM | POA: Diagnosis present

## 2018-01-27 DIAGNOSIS — H547 Unspecified visual loss: Secondary | ICD-10-CM | POA: Diagnosis present

## 2018-01-27 DIAGNOSIS — F329 Major depressive disorder, single episode, unspecified: Secondary | ICD-10-CM | POA: Diagnosis present

## 2018-01-27 DIAGNOSIS — S72001A Fracture of unspecified part of neck of right femur, initial encounter for closed fracture: Secondary | ICD-10-CM | POA: Diagnosis present

## 2018-01-27 DIAGNOSIS — Z7984 Long term (current) use of oral hypoglycemic drugs: Secondary | ICD-10-CM | POA: Diagnosis not present

## 2018-01-27 DIAGNOSIS — W010XXA Fall on same level from slipping, tripping and stumbling without subsequent striking against object, initial encounter: Secondary | ICD-10-CM | POA: Diagnosis not present

## 2018-01-27 DIAGNOSIS — R509 Fever, unspecified: Secondary | ICD-10-CM

## 2018-01-27 DIAGNOSIS — I10 Essential (primary) hypertension: Secondary | ICD-10-CM | POA: Diagnosis present

## 2018-01-27 DIAGNOSIS — Z681 Body mass index (BMI) 19 or less, adult: Secondary | ICD-10-CM | POA: Diagnosis not present

## 2018-01-27 DIAGNOSIS — Z23 Encounter for immunization: Secondary | ICD-10-CM

## 2018-01-27 DIAGNOSIS — F0390 Unspecified dementia without behavioral disturbance: Secondary | ICD-10-CM | POA: Diagnosis present

## 2018-01-27 DIAGNOSIS — F0280 Dementia in other diseases classified elsewhere without behavioral disturbance: Secondary | ICD-10-CM | POA: Diagnosis present

## 2018-01-27 DIAGNOSIS — E43 Unspecified severe protein-calorie malnutrition: Secondary | ICD-10-CM | POA: Diagnosis present

## 2018-01-27 DIAGNOSIS — Z79899 Other long term (current) drug therapy: Secondary | ICD-10-CM

## 2018-01-27 DIAGNOSIS — E1140 Type 2 diabetes mellitus with diabetic neuropathy, unspecified: Secondary | ICD-10-CM | POA: Diagnosis present

## 2018-01-27 DIAGNOSIS — E119 Type 2 diabetes mellitus without complications: Secondary | ICD-10-CM

## 2018-01-27 LAB — PROTIME-INR
INR: 1
PROTHROMBIN TIME: 13.1 s (ref 11.4–15.2)

## 2018-01-27 LAB — CBC
HCT: 31 % — ABNORMAL LOW (ref 35.0–47.0)
HEMOGLOBIN: 11.1 g/dL — AB (ref 12.0–16.0)
MCH: 31.1 pg (ref 26.0–34.0)
MCHC: 35.7 g/dL (ref 32.0–36.0)
MCV: 87.3 fL (ref 80.0–100.0)
Platelets: 271 10*3/uL (ref 150–440)
RBC: 3.55 MIL/uL — AB (ref 3.80–5.20)
RDW: 14 % (ref 11.5–14.5)
WBC: 8.3 10*3/uL (ref 3.6–11.0)

## 2018-01-27 LAB — BASIC METABOLIC PANEL
Anion gap: 9 (ref 5–15)
BUN: 18 mg/dL (ref 8–23)
CHLORIDE: 102 mmol/L (ref 98–111)
CO2: 29 mmol/L (ref 22–32)
Calcium: 9 mg/dL (ref 8.9–10.3)
Creatinine, Ser: 0.81 mg/dL (ref 0.44–1.00)
GFR calc non Af Amer: >60 mL/min (ref >60)
Glucose, Bld: 181 mg/dL — ABNORMAL HIGH (ref 70–99)
POTASSIUM: 5.3 mmol/L — AB (ref 3.5–5.1)
SODIUM: 140 mmol/L (ref 135–145)

## 2018-01-27 LAB — TYPE AND SCREEN
ABO/RH(D): A POS
ANTIBODY SCREEN: NEGATIVE

## 2018-01-27 LAB — APTT: APTT: 28 s (ref 24–36)

## 2018-01-27 MED ORDER — INSULIN ASPART 100 UNIT/ML ~~LOC~~ SOLN
0.0000 [IU] | Freq: Every day | SUBCUTANEOUS | Status: DC
  Administered 2018-01-29: 2 [IU] via SUBCUTANEOUS
  Filled 2018-01-27: qty 1

## 2018-01-27 MED ORDER — MIRTAZAPINE 15 MG PO TABS
15.0000 mg | ORAL_TABLET | Freq: Every day | ORAL | Status: DC
  Administered 2018-01-28 – 2018-01-30 (×3): 15 mg via ORAL
  Filled 2018-01-27 (×3): qty 1

## 2018-01-27 MED ORDER — ONDANSETRON HCL 4 MG/2ML IJ SOLN
4.0000 mg | Freq: Once | INTRAMUSCULAR | Status: AC
  Administered 2018-01-27: 4 mg via INTRAVENOUS
  Filled 2018-01-27: qty 2

## 2018-01-27 MED ORDER — MORPHINE SULFATE (PF) 2 MG/ML IV SOLN: 2.0000 mg | INTRAVENOUS | Status: DC | PRN

## 2018-01-27 MED ORDER — ACETAMINOPHEN 500 MG PO TABS
1000.0000 mg | ORAL_TABLET | Freq: Once | ORAL | Status: AC
  Administered 2018-01-27: 1000 mg via ORAL
  Filled 2018-01-27: qty 2

## 2018-01-27 MED ORDER — FENTANYL CITRATE (PF) 100 MCG/2ML IJ SOLN
50.0000 ug | Freq: Once | INTRAMUSCULAR | Status: AC
Start: 2018-01-27 — End: 2018-01-27
  Administered 2018-01-27: 50 ug via INTRAVENOUS
  Filled 2018-01-27: qty 2

## 2018-01-27 MED ORDER — ACETAMINOPHEN 325 MG PO TABS
650.0000 mg | ORAL_TABLET | Freq: Four times a day (QID) | ORAL | Status: DC | PRN
  Administered 2018-01-30 (×2): 650 mg via ORAL
  Filled 2018-01-27 (×2): qty 2

## 2018-01-27 MED ORDER — ACETAMINOPHEN 650 MG RE SUPP: 650.0000 mg | Freq: Four times a day (QID) | RECTAL | Status: DC | PRN

## 2018-01-27 MED ORDER — GABAPENTIN 100 MG PO CAPS
100.0000 mg | ORAL_CAPSULE | Freq: Every day | ORAL | Status: DC
  Administered 2018-01-28 – 2018-01-31 (×4): 100 mg via ORAL
  Filled 2018-01-27 (×4): qty 1

## 2018-01-27 MED ORDER — ONDANSETRON HCL 4 MG/2ML IJ SOLN: 4.0000 mg | Freq: Four times a day (QID) | INTRAMUSCULAR | Status: DC | PRN

## 2018-01-27 MED ORDER — LEVOTHYROXINE SODIUM 88 MCG PO TABS
88.0000 ug | ORAL_TABLET | Freq: Every day | ORAL | Status: DC
  Administered 2018-01-28 – 2018-01-31 (×4): 88 ug via ORAL
  Filled 2018-01-27 (×4): qty 1

## 2018-01-27 MED ORDER — SERTRALINE HCL 50 MG PO TABS
75.0000 mg | ORAL_TABLET | Freq: Every day | ORAL | Status: DC
  Administered 2018-01-28 – 2018-01-31 (×4): 75 mg via ORAL
  Filled 2018-01-27 (×5): qty 2

## 2018-01-27 MED ORDER — TAMSULOSIN HCL 0.4 MG PO CAPS
0.4000 mg | ORAL_CAPSULE | Freq: Every day | ORAL | Status: DC
  Administered 2018-01-28 – 2018-01-31 (×4): 0.4 mg via ORAL
  Filled 2018-01-27 (×5): qty 1

## 2018-01-27 MED ORDER — ENOXAPARIN SODIUM 40 MG/0.4ML ~~LOC~~ SOLN
40.0000 mg | SUBCUTANEOUS | Status: DC
  Administered 2018-01-28 – 2018-01-30 (×3): 40 mg via SUBCUTANEOUS
  Filled 2018-01-27 (×3): qty 0.4

## 2018-01-27 MED ORDER — TRAZODONE HCL 50 MG PO TABS
50.0000 mg | ORAL_TABLET | Freq: Every day | ORAL | Status: DC
  Administered 2018-01-28 – 2018-01-30 (×3): 50 mg via ORAL
  Filled 2018-01-27 (×3): qty 1

## 2018-01-27 MED ORDER — ONDANSETRON HCL 4 MG PO TABS: 4.0000 mg | ORAL_TABLET | Freq: Four times a day (QID) | ORAL | Status: DC | PRN

## 2018-01-27 MED ORDER — RAMELTEON 8 MG PO TABS
8.0000 mg | ORAL_TABLET | Freq: Every day | ORAL | Status: DC
  Administered 2018-01-29 – 2018-01-30 (×2): 8 mg via ORAL
  Filled 2018-01-27 (×5): qty 1

## 2018-01-27 MED ORDER — OXYCODONE HCL 5 MG PO TABS: 5.0000 mg | ORAL_TABLET | ORAL | Status: DC | PRN

## 2018-01-27 MED ORDER — INSULIN ASPART 100 UNIT/ML ~~LOC~~ SOLN
0.0000 [IU] | Freq: Three times a day (TID) | SUBCUTANEOUS | Status: DC
  Administered 2018-01-28 (×2): 2 [IU] via SUBCUTANEOUS
  Administered 2018-01-29 – 2018-01-30 (×2): 1 [IU] via SUBCUTANEOUS
  Administered 2018-01-30: 2 [IU] via SUBCUTANEOUS
  Administered 2018-01-30: 3 [IU] via SUBCUTANEOUS
  Administered 2018-01-31 (×2): 1 [IU] via SUBCUTANEOUS
  Filled 2018-01-27 (×8): qty 1
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
## 2018-01-28 LAB — SURGICAL PCR SCREEN
MRSA, PCR: NEGATIVE
Staphylococcus aureus: NEGATIVE

## 2018-01-28 LAB — BASIC METABOLIC PANEL
ANION GAP: 9 (ref 5–15)
BUN: 19 mg/dL (ref 8–23)
CHLORIDE: 104 mmol/L (ref 98–111)
CO2: 29 mmol/L (ref 22–32)
Calcium: 8.7 mg/dL — ABNORMAL LOW (ref 8.9–10.3)
Creatinine, Ser: 0.81 mg/dL (ref 0.44–1.00)
GFR calc Af Amer: >60 mL/min (ref >60)
GFR calc non Af Amer: >60 mL/min (ref >60)
GLUCOSE: 126 mg/dL — AB (ref 70–99)
POTASSIUM: 4.6 mmol/L (ref 3.5–5.1)
Sodium: 142 mmol/L (ref 135–145)

## 2018-01-28 LAB — GLUCOSE, CAPILLARY
GLUCOSE-CAPILLARY: 155 mg/dL — AB (ref 70–99)
GLUCOSE-CAPILLARY: 141 mg/dL — AB (ref 70–99)
Glucose-Capillary: 101 mg/dL — ABNORMAL HIGH (ref 70–99)
Glucose-Capillary: 186 mg/dL — ABNORMAL HIGH (ref 70–99)

## 2018-01-28 LAB — CBC
HEMATOCRIT: 29.1 % — AB (ref 35.0–47.0)
Hemoglobin: 10.1 g/dL — ABNORMAL LOW (ref 12.0–16.0)
MCH: 30.4 pg (ref 26.0–34.0)
MCHC: 34.6 g/dL (ref 32.0–36.0)
MCV: 87.9 fL (ref 80.0–100.0)
Platelets: 213 10*3/uL (ref 150–440)
RBC: 3.31 MIL/uL — ABNORMAL LOW (ref 3.80–5.20)
RDW: 14.3 % (ref 11.5–14.5)
WBC: 8.9 10*3/uL (ref 3.6–11.0)

## 2018-01-28 MED ORDER — INFLUENZA VAC SPLIT HIGH-DOSE 0.5 ML IM SUSY
0.5000 mL | PREFILLED_SYRINGE | INTRAMUSCULAR | Status: AC
  Administered 2018-01-30: 0.5 mL via INTRAMUSCULAR
  Filled 2018-01-28 (×3): qty 0.5

## 2018-01-28 NOTE — Progress Notes (Signed)
Patient resting in bed. No complaints of pain at the moment. Hip brace in place, placed by biotech. Call bell in reach. Low bed in lowest position, alarm on. Continue to monitor.

## 2018-01-28 NOTE — Progress Notes (Signed)
[facility] [place] at [facility]   PATIENT NAME: [name]    MR#:  [id number]  DATE OF BIRTH:  1927-05-29  SUBJECTIVE:   Patient presented to the hospital after a fall and noted to have a right periprosthetic fracture.  Seen by orthopedic surgery and they think this is likely nonoperable and recommended conservative management.  Complaining of some right hip pain.  REVIEW OF SYSTEMS:    Review of Systems  Unable to perform ROS: Mental acuity    Nutrition: Heart Healthy/Carb control Tolerating Diet: Yes Tolerating PT: Await Eval.   DRUG ALLERGIES:  No Known Allergies  VITALS:  Blood pressure (!) 148/63, pulse 84, temperature 99.1 F (37.3 C), temperature source Oral, resp. rate 18, height 5\' 5"  (1.651 m), weight 52.2 kg, SpO2 97 %.  PHYSICAL EXAMINATION:   Physical Exam  GENERAL:  84 y.o.-year-old thin patient lying in bed in no acute distress.  EYES: Pupils equal, round, reactive to light and accommodation. No scleral icterus. Extraocular muscles intact.  HEENT: Head atraumatic, normocephalic. Oropharynx and nasopharynx clear.  NECK:  Supple, no jugular venous distention. No thyroid enlargement, no tenderness.  LUNGS: Poor Resp. effort, no wheezing, rales, rhonchi. No use of accessory muscles of respiration.  CARDIOVASCULAR: S1, S2 normal. No murmurs, rubs, or gallops.  ABDOMEN: Soft, nontender, nondistended. Bowel sounds present. No organomegaly or mass.  EXTREMITIES: No cyanosis, clubbing or edema b/l.    NEUROLOGIC: Cranial nerves II through XII are intact. No focal Motor or sensory deficits b/l. Globally weak   PSYCHIATRIC: The patient is alert and oriented x 1. SKIN: No obvious rash, lesion, or ulcer.    LABORATORY PANEL:   CBC Recent Labs  Lab 01/28/18 0532  WBC 8.9  HGB 10.1*  HCT 29.1*  PLT 213   ------------------------------------------------------------------------------------------------------------------  Chemistries   Recent Labs  Lab 01/28/18 0532  NA 142  K 4.6  CL 104  CO2 29  GLUCOSE 126*  BUN 19  CREATININE 0.81  CALCIUM 8.7*   ------------------------------------------------------------------------------------------------------------------  Cardiac Enzymes No results for input(s): TROPONINI in the last 168 hours. ------------------------------------------------------------------------------------------------------------------  RADIOLOGY:  Dg Chest Portable 1 View  Result Date: 01/27/2018 CLINICAL DATA:  Preoperative respiratory evaluation. EXAM: PORTABLE CHEST 1 VIEW COMPARISON:  01/09/2015 FINDINGS: 1918 hours. Lungs are hyperexpanded. 7 mm nodular density projects over the anterior right second rib. This may be a bony abnormality, but lung nodule cannot be excluded. The lungs are otherwise clear without focal pneumonia, edema, pneumothorax or pleural effusion. Chronic scarring noted left base. The cardiopericardial silhouette is within normal limits for size. The visualized bony structures of the thorax are intact. Telemetry leads overlie the chest. IMPRESSION: Nodular density right upper lobe may reflect a bony abnormality as it is superimposed on 2 ribs and the scapula, but lung nodule not excluded. CT chest without contrast could be used to further evaluate. Hyperexpansion without edema or focal lung consolidation. Electronically Signed   By: Kennith Center M.D.   On: 01/27/2018 19:29   Dg Hip Unilat W Or Wo Pelvis 2-3 Views Right  Result Date: 01/27/2018 CLINICAL DATA:  Right thigh pain status post fall. History of hip arthroplasty. EXAM: DG HIP (WITH OR WITHOUT PELVIS) 2-3V RIGHT COMPARISON:  Radiographs 01/08/2015 and 01/07/2015. FINDINGS: The bones are diffusely demineralized. Patient is status post right hip bipolar hemiarthroplasty. There is an acute fracture of the proximal right femur adjacent to the prosthesis with a mildly displaced component involving the greater trochanter. This  fracture is  best seen on the frog-leg lateral views. There is no dislocation or acute pelvic fracture. There is possible posttraumatic deformity of the right inferior pubic ramus. Pelvic calcifications are stable. IMPRESSION: Periprosthetic fracture involving the proximal right femur post bipolar hemiarthroplasty. No dislocation. Electronically Signed   By: Carey BullocksWilliam  Veazey M.D.   On: 01/27/2018 19:02     ASSESSMENT AND PLAN:   84 year old female with past medical history of dementia, diabetes, hypertension, hypothyroidism who presents to the hospital after a fall and noted to have a right hip periprosthetic fracture.  1.  Status post fall with a right hip periprosthetic fracture- patient has had a previous prosthesis and now develops a fracture proximal to it. - Seen by orthopedics and they do not think this is operable and the recommend conservative management. - Patient should be nonweightbearing on that leg for 6 weeks, continue physical therapy as tolerated and pain control. Follow-up with orthopedics in the next 2 weeks.  2.  Diabetes type 2 without complication-continue sliding scale insulin.  Follow blood sugars.  3.  Diabetic neuropathy-continue gabapentin.  4.  Hypothyroidism-continue Synthroid.  5.  Depression-continue Zoloft.  Continue trazodone for sleep along with Remeron.  Pt's prognosis is poor given her debilitated status. ?? Palliative care consult to discuss goals of care. Pt. Is FULL CODE for now.   All the records are reviewed and case discussed with Care Management/Social Worker. Management plans discussed with the patient, family and they are in agreement.  CODE STATUS: Full code  DVT Prophylaxis: Lovenox  TOTAL TIME TAKING CARE OF THIS PATIENT: 30 minutes.   POSSIBLE D/C IN 1-2 DAYS, DEPENDING ON CLINICAL CONDITION.   Houston SirenSAINANI,VIVEK J M.D on 01/28/2018 at 1:34 PM  Between 7am to 6pm - Pager - [phone]  After 6pm go to [email or web address] - [email or web address] [email or web address]  [facility]  [facility]  Office  [phone]  CC: Primary care physician; Clemetine Markerampbell, Margaret P, FNP
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
## 2018-01-28 NOTE — Plan of Care (Signed)

## 2018-01-28 NOTE — Progress Notes (Signed)
Reviewed patient information will complete assessment on patient after PT consult is in LCSW obtained Passr Number [id number] A for future Kendall FlackFl2  Cailey Trigueros LCSW [id number]

## 2018-01-29 LAB — GLUCOSE, CAPILLARY
GLUCOSE-CAPILLARY: 109 mg/dL — AB (ref 70–99)
GLUCOSE-CAPILLARY: 143 mg/dL — AB (ref 70–99)
GLUCOSE-CAPILLARY: 212 mg/dL — AB (ref 70–99)
Glucose-Capillary: 163 mg/dL — ABNORMAL HIGH (ref 70–99)

## 2018-01-29 MED ORDER — ENSURE ENLIVE PO LIQD
237.0000 mL | Freq: Three times a day (TID) | ORAL | Status: DC
  Administered 2018-01-29 – 2018-01-31 (×5): 237 mL via ORAL

## 2018-01-29 MED ORDER — ADULT MULTIVITAMIN W/MINERALS CH
1.0000 | ORAL_TABLET | Freq: Every day | ORAL | Status: DC
  Administered 2018-01-30 – 2018-01-31 (×2): 1 via ORAL
  Filled 2018-01-29 (×2): qty 1

## 2018-01-29 NOTE — Evaluation (Signed)
Physical Therapy Evaluation Patient Details Name: [name] MRN: [id number] DOB: May 12, 1927 Today's Date: 01/29/2018   History of Present Illness  [name]  is a 84 y.o. female who presents with chief complaint as above.  Patient is demented and is unable to contribute much information to her HPI.  Clinical Impression  Patient is 84 years old and is being treated with hip brace and pain meds after patient fell and has right hip fx. She has hemiarthroplasty from 2016. She is NWB RLE and limited in her mobility due to weakness and cognitive deficits from dementia. She performs bed mobility with max assist supine to sit. She is able to sit on edge of bed with mod assist and hip brace limits her trunk mobility. She is not able to transfer sit to stand due to patient refusing and inability to stand up and follow right hip NWB precautions. She will continue to benefit from skilled PT to improve mobility.     Follow Up Recommendations      Equipment Recommendations  Rolling walker with 5" wheels    Recommendations for Other Services       Precautions / Restrictions Precautions Precautions: (hip brace, NWB RLE) Restrictions Weight Bearing Restrictions: (NWB RLE)      Mobility  Bed Mobility Overal bed mobility: Needs Assistance Bed Mobility: Supine to Sit     Supine to sit: Max assist        Transfers Overall transfer level: (Patient unable due to dementia and RLE NWB )                  Ambulation/Gait Ambulation/Gait assistance: (NT due to not able to stand up with RLE NWB precautions)              Stairs            Wheelchair Mobility    Modified Rankin (Stroke Patients Only)       Balance Overall balance assessment: Mild deficits observed, not formally tested                                           Pertinent Vitals/Pain      Home Living Family/patient expects to be discharged to:: (skilled nursing)                      Prior Function Level of Independence: (unable to determine)               Hand Dominance        Extremity/Trunk Assessment   Upper Extremity Assessment Upper Extremity Assessment: Overall WFL for tasks assessed    Lower Extremity Assessment Lower Extremity Assessment: Difficult to assess due to impaired cognition    Cervical / Trunk Assessment Cervical / Trunk Assessment: Normal  Communication   Communication: (patient has dementia)  Cognition Arousal/Alertness: Awake/alert Behavior During Therapy: WFL for tasks assessed/performed Overall Cognitive Status: History of cognitive impairments - at baseline                                        General Comments      Exercises     Assessment/Plan    PT Assessment Patient needs continued PT services  PT Problem List Decreased strength;Decreased activity tolerance;Decreased mobility;Decreased cognition;Pain  PT Treatment Interventions Gait training;Functional mobility training;Therapeutic activities    PT Goals (Current goals can be found in the Care Plan section)  Acute Rehab PT Goals Patient Stated Goal: (no goals stated) PT Goal Formulation: Patient unable to participate in goal setting Time For Goal Achievement: 02/12/18 Potential to Achieve Goals: Poor    Frequency Min 2X/week   Barriers to discharge Other (comment) (dementia)    Co-evaluation               AM-PAC PT "6 Clicks" Daily Activity  Outcome Measure Difficulty turning over in bed (including adjusting bedclothes, sheets and blankets)?: Unable Difficulty moving from lying on back to sitting on the side of the bed? : Unable Difficulty sitting down on and standing up from a chair with arms (e.g., wheelchair, bedside commode, etc,.)?: Unable Help needed moving to and from a bed to chair (including a wheelchair)?: Total Help needed walking in hospital room?: Total Help needed climbing 3-5 steps  with a railing? : Total 6 Click Score: 6    End of Session Equipment Utilized During Treatment: Gait belt;Other (comment) Activity Tolerance: (poor tolerance due to hip brace being uncomfortable)     PT Visit Diagnosis: Muscle weakness (generalized) (M62.81);History of falling (Z91.81);Difficulty in walking, not elsewhere classified (R26.2)    Time: 0930-1000 PT Time Calculation (min) (ACUTE ONLY): 30 min   Charges:   PT Evaluation $PT Eval Low Complexity: 1 Low PT Treatments $Therapeutic Activity: 8-22 mins          Ezekiel Ina, PT DPT 01/29/2018, 10:25 AM

## 2018-01-29 NOTE — Progress Notes (Addendum)
LCSW met with patient. Patient was not communicating initially and then nodded when I asked to contact her daughter. I explained that she may need a higher level of care and I requested I collect information to complete assessment and Fl2 and obtain passr number and send out information  If needs. Patient nodded to call her daughter.  [employer] LCSW [phone]

## 2018-01-29 NOTE — Progress Notes (Signed)
[facility] [place] [facility]   PATIENT NAME: [name]    MR#:  [id number]  DATE OF BIRTH:  06-19-1927  SUBJECTIVE:   No acute events overnight, seen by orthopedics and a immobilizer placed on the right hip.  Denies any right hip pain presently.  REVIEW OF SYSTEMS:    Review of Systems  Unable to perform ROS: Mental acuity    Nutrition: Heart Healthy/Carb control Tolerating Diet: Yes Tolerating PT: Await Eval.   DRUG ALLERGIES:  No Known Allergies  VITALS:  Blood pressure (!) 156/70, pulse 78, temperature 98.8 F (37.1 C), temperature source Oral, resp. rate 19, height 5\' 5"  (1.651 m), weight 52.2 kg, SpO2 98 %.  PHYSICAL EXAMINATION:   Physical Exam  GENERAL:  84 y.o.-year-old thin patient lying in bed in no acute distress.  EYES: Pupils equal, round, reactive to light and accommodation. No scleral icterus. Extraocular muscles intact.  HEENT: Head atraumatic, normocephalic. Oropharynx and nasopharynx clear.  NECK:  Supple, no jugular venous distention. No thyroid enlargement, no tenderness.  LUNGS: Poor Resp. effort, no wheezing, rales, rhonchi. No use of accessory muscles of respiration.  CARDIOVASCULAR: S1, S2 normal. No murmurs, rubs, or gallops.  ABDOMEN: Soft, nontender, nondistended. Bowel sounds present. No organomegaly or mass.  EXTREMITIES: No cyanosis, clubbing or edema b/l. Right Hip in a Immobilizer.   NEUROLOGIC: Cranial nerves II through XII are intact. No focal Motor or sensory deficits b/l. Globally weak   PSYCHIATRIC: The patient is alert and oriented x 1. SKIN: No obvious rash, lesion, or ulcer.    LABORATORY PANEL:   CBC Recent Labs  Lab 01/28/18 0532  WBC 8.9  HGB 10.1*  HCT 29.1*  PLT 213   ------------------------------------------------------------------------------------------------------------------  Chemistries  Recent Labs  Lab 01/28/18 0532  NA 142  K 4.6  CL 104  CO2 29  GLUCOSE 126*  BUN 19   CREATININE 0.81  CALCIUM 8.7*   ------------------------------------------------------------------------------------------------------------------  Cardiac Enzymes No results for input(s): TROPONINI in the last 168 hours. ------------------------------------------------------------------------------------------------------------------  RADIOLOGY:  Dg Chest Portable 1 View  Result Date: 01/27/2018 CLINICAL DATA:  Preoperative respiratory evaluation. EXAM: PORTABLE CHEST 1 VIEW COMPARISON:  01/09/2015 FINDINGS: 1918 hours. Lungs are hyperexpanded. 7 mm nodular density projects over the anterior right second rib. This may be a bony abnormality, but lung nodule cannot be excluded. The lungs are otherwise clear without focal pneumonia, edema, pneumothorax or pleural effusion. Chronic scarring noted left base. The cardiopericardial silhouette is within normal limits for size. The visualized bony structures of the thorax are intact. Telemetry leads overlie the chest. IMPRESSION: Nodular density right upper lobe may reflect a bony abnormality as it is superimposed on 2 ribs and the scapula, but lung nodule not excluded. CT chest without contrast could be used to further evaluate. Hyperexpansion without edema or focal lung consolidation. Electronically Signed   By: Kennith CenterEric  Mansell M.D.   On: 01/27/2018 19:29   Dg Hip Unilat W Or Wo Pelvis 2-3 Views Right  Result Date: 01/27/2018 CLINICAL DATA:  Right thigh pain status post fall. History of hip arthroplasty. EXAM: DG HIP (WITH OR WITHOUT PELVIS) 2-3V RIGHT COMPARISON:  Radiographs 01/08/2015 and 01/07/2015. FINDINGS: The bones are diffusely demineralized. Patient is status post right hip bipolar hemiarthroplasty. There is an acute fracture of the proximal right femur adjacent to the prosthesis with a mildly displaced component involving the greater trochanter. This fracture is best seen on the frog-leg lateral views. There is no dislocation or acute pelvic  fracture. There is possible posttraumatic deformity of the right inferior pubic ramus. Pelvic calcifications are stable. IMPRESSION: Periprosthetic fracture involving the proximal right femur post bipolar hemiarthroplasty. No dislocation. Electronically Signed   By: Carey Bullocks M.D.   On: 01/27/2018 19:02     ASSESSMENT AND PLAN:   84 year old female with past medical history of dementia, diabetes, hypertension, hypothyroidism who presents to the hospital after a fall and noted to have a right hip periprosthetic fracture.  1.  Status post fall with a right hip periprosthetic fracture- patient has had a previous prosthesis and now develops a fracture proximal to it. - Seen by orthopedics and they do not think this is operable and they recommend conservative management. Right hip now in a immobilizer.   - Patient should be nonweightbearing on that leg for 6 weeks, continue physical therapy as tolerated and pain control. Follow-up with orthopedics in the next 2 weeks.  2.  Diabetes type 2 without complication-continue sliding scale insulin.   - BS stable   3.  Diabetic neuropathy-continue gabapentin.  4.  Hypothyroidism-continue Synthroid.  5.  Depression-continue Zoloft.  Continue trazodone for sleep along with Remeron.  Possible d/c to rehab in a.m. Tomorrow.   All the records are reviewed and case discussed with Care Management/Social Worker. Management plans discussed with the patient, family and they are in agreement.  CODE STATUS: Full code  DVT Prophylaxis: Lovenox  TOTAL TIME TAKING CARE OF THIS PATIENT: 25 minutes.   POSSIBLE D/C IN 1-2 DAYS, DEPENDING ON CLINICAL CONDITION.   Houston Siren M.D on 01/29/2018 at 1:08 PM  Between 7am to 6pm - Pager - [phone]  After 6pm go to [email or web address] - Social research officer, government  [facility]  Office  [phone]  CC: Primary care physician; Clemetine Marker, FNP

## 2018-01-29 NOTE — Progress Notes (Signed)
Initial Nutrition Assessment  DOCUMENTATION CODES:   Severe malnutrition in context of chronic illness, Underweight  INTERVENTION:  Will downgrade diet to dysphagia 3 (mechanical soft) with thin liquids in setting of poor dentition.  Provide Ensure Enlive po TID with meals, each supplement provides 350 kcal and 20 grams of protein.  Provide daily MVI.  NUTRITION DIAGNOSIS:   Severe Malnutrition related to social / environmental circumstances(dementia, advanced age, inadequate oral intake) as evidenced by severe fat depletion, severe muscle depletion.  GOAL:   Patient will meet greater than or equal to 90% of their needs  MONITOR:   PO intake, Supplement acceptance, Labs, Weight trends, I & O's  REASON FOR ASSESSMENT:   Malnutrition Screening Tool    ASSESSMENT:   84 year old female with PMHx of Alzheimer's dementia, DM type 2, diabetic neuropathy, depression, hypothyroidism, vision impairment, hearing impairment, HTN, unsteady gait, hx right hip arthroplasty on 01/08/2015 who is admitted after a fall found to have right hip periprosthetic fracture that is not operable so plan is for conservative management.   Met with patient at bedside. In setting of dementia she is not able to provide much history. She did know what her UBW was. Also reports that she does not eat much, but is unable to report full nutrition history. Per paper chart patient is admitted from [facility]. There is no mention of what diet she is on, but RD noted poor dentition on NFPE. Patient receives Boost TID and metformin 500 mg BID.  Patient reports her UBW was 158 lbs (71.8 kg). Did not see a weight that high in chart. Per chart she was 63.1 kg on 01/07/2015, 53.3 kg on 04/11/2015, and 53.5 kg on 07/07/2015. RD obtained bed scale weight of 103 lbs (46.7 kg) on low bed today.  Meal completion not recorded in chart. NT reports patient requires assistance with setting up tray/cutting food, but then can feed  herself.  Medications reviewed and include: gabapentin, Novolog 0-9 units TID, Novolog 0-5 units QHS, levothyroxine, Remeron 15 mg QHS, sertraline 75 mg daily.  Labs reviewed: CBG 109-163 past 24 hrs.  NUTRITION - FOCUSED PHYSICAL EXAM:    Most Recent Value  Orbital Region  Severe depletion  Upper Arm Region  Severe depletion  Thoracic and Lumbar Region  Severe depletion  Buccal Region  Severe depletion  Temple Region  Severe depletion  Clavicle Bone Region  Severe depletion  Clavicle and Acromion Bone Region  Severe depletion  Scapular Bone Region  Severe depletion  Dorsal Hand  Severe depletion  Patellar Region  Severe depletion  Anterior Thigh Region  Severe depletion  Posterior Calf Region  Severe depletion  Edema (RD Assessment)  None  Hair  Reviewed  Eyes  Reviewed  Mouth  Reviewed [poor dentition]  Skin  Reviewed  Nails  Reviewed     Diet Order:   Diet Order            Diet heart healthy/carb modified Room service appropriate? Yes; Fluid consistency: Thin  Diet effective now              EDUCATION NEEDS:   Not appropriate for education at this time  Skin:  Skin Assessment: Reviewed RN Assessment  Last BM:  Unknown  Height:   Ht Readings from Last 1 Encounters:  01/27/18 '5\' 5"'  (1.651 m)    Weight:   Wt Readings from Last 1 Encounters:  01/29/18 46.7 kg    Ideal Body Weight:  56.8 kg  BMI:  Body mass index is 17.14 kg/m.  Estimated Nutritional Needs:   Kcal:  1300-1500  Protein:  70-80 grams  Fluid:  1.4 L/day  Willey Blade, MS, RD, LDN Office: [phone] Pager: [phone] After Hours/Weekend Pager: [phone]

## 2018-01-29 NOTE — Clinical Social Work Note (Signed)
Clinical Social Work Assessment  Patient Details  Name: [name] MRN: [id number] Date of Birth: Apr 11, 1928  Date of referral:  01/29/18               Reason for consult:  Facility Placement                Permission sought to share information with:  Family Supports, Magazine features editoracility Contact Representative Permission granted to share information::  Yes, Verbal Permission Granted  Name::      Daughter [name] [phone]  Agency::  [facility]- [facility]  Relationship::     Contact Information:     Housing/Transportation Living arrangements for the past 2 months:  Assisted Living Facility Source of Information:  Patient, Medical Team Patient Interpreter Needed:  None Criminal Activity/Legal Involvement Pertinent to Current Situation/Hospitalization:  No - Comment as needed Significant Relationships:  Adult Children Lives with:  Facility Resident Do you feel safe going back to the place where you live?    Need for family participation in patient care:  Yes (Comment)  Care giving concerns: Called Liborio NixonJanice @ [facility] left message 01/29/18   Social Worker assessment / plan: LCSW introduced myself to patient. She was not responding to questions. [facility]. Called the facility to collect additional information. Left message with Janice/[facility]. PT assessment was completed and they are recommending as follows " Patient is 84 years old and is being treated with hip brace and pain meds after patient fell and has right hip fx. She has hemiarthroplasty from 2016. She is NWB RLE and limited in her mobility due to weakness and cognitive deficits from dementia. She performs bed mobility with max assist supine to sit. She is able to sit on edge of bed with mod assist and hip brace limits her trunk mobility. She is not able to transfer sit to stand due to patient refusing and inability to stand up and follow right hip NWB precautions. She will  continue to benefit from skilled PT to improve mobility. " LCSW started Fl2 and passr number obtained. ( medicare/medicaid) Will require full adl assistance.  Employment status:  Retired Health and safety inspectornsurance information:  Armed forces operational officerMedicare, [place] Recommendations:   ALF - In home to be determined Information / Referral to community resources:   None  Patient/Family's Response to care:  TBD  Patient/Family's Understanding of and Emotional Response to Diagnosis, Current Treatment, and Prognosis:    Emotional Assessment Appearance:  Appears stated age Attitude/Demeanor/Rapport:  Other(Patient very tired and not following LCSW and or conversation) Affect (typically observed):  Calm Orientation:  Oriented to Self Alcohol / Substance use:  Not Applicable Psych involvement (Current and /or in the community):  No (Comment)  Discharge Needs  Concerns to be addressed:  Care Coordination Readmission within the last 30 days:  No Current discharge risk:  None Barriers to Discharge:  No Barriers Identified   Cheron SchaumannBandi, Aarsh Fristoe M, LCSW 01/29/2018, 11:16 AM

## 2018-01-29 NOTE — NC FL2 (Signed)
[place] MEDICAID FL2 LEVEL OF CARE SCREENING TOOL     IDENTIFICATION  Patient Name: [name] Birthdate: [birth year] Sex: female Admission Date (Current Location): 01/27/2018  [place]   Facility and Address:  [facility], [address], [place], [place] [address]      Provider Number: [id number]  Attending Physician Name and Address:  Houston SirenSainani, Vivek J, MD  Relative Name and Phone Number:       Current Level of Care: Hospital Recommended Level of Care: Memory Care, Assisted Living Facility Prior Approval Number:    Date Approved/Denied:   PASRR Number: [id number]  Discharge Plan: Other (Comment)([facility])    Current Diagnoses: Patient Active Problem List   Diagnosis Date Noted  . Hypothyroidism 01/27/2018  . HTN (hypertension) 01/27/2018  . Diabetes (HCC) 01/27/2018  . Pressure ulcer 04/12/2015  . Gangrene (HCC) 04/11/2015  . Urinary retention 02/04/2015  . Atelectasis 01/10/2015  . Acute encephalopathy 01/10/2015  . Dementia 01/10/2015  . Closed right hip fracture (HCC) 01/07/2015    Orientation RESPIRATION BLADDER Height & Weight     Self  Normal Incontinent Weight: 115 lb (52.2 kg) Height:  5\' 5"  (165.1 cm)  BEHAVIORAL SYMPTOMS/MOOD NEUROLOGICAL BOWEL NUTRITION STATUS      Incontinent    AMBULATORY STATUS COMMUNICATION OF NEEDS Skin   Extensive Assist Verbally Normal                       Personal Care Assistance Level of Assistance  Bathing, Feeding, Dressing, Total care Bathing Assistance: Maximum assistance Feeding assistance: Independent Dressing Assistance: Limited assistance Total Care Assistance: Maximum assistance   Functional Limitations Info  Sight, Hearing, Speech Sight Info: Adequate Hearing Info: Impaired Speech Info: Impaired(Not very communicative)    SPECIAL CARE FACTORS FREQUENCY  PT (By licensed PT), OT (By licensed OT)     PT Frequency: 2-3x OT  Frequency: 2-3x            Contractures Contractures Info: Not present    Additional Factors Info                  Current Medications (01/29/2018):  This is the current hospital active medication list Current Facility-Administered Medications  Medication Dose Route Frequency Provider Last Rate Last Dose  . acetaminophen (TYLENOL) tablet 650 mg  650 mg Oral Q6H PRN Oralia ManisWillis, David, MD       Or  . acetaminophen (TYLENOL) suppository 650 mg  650 mg Rectal Q6H PRN Oralia ManisWillis, David, MD      . enoxaparin (LOVENOX) injection 40 mg  40 mg Subcutaneous Q24H Oralia ManisWillis, David, MD   40 mg at 01/28/18 2152  . gabapentin (NEURONTIN) capsule 100 mg  100 mg Oral Daily Oralia ManisWillis, David, MD   100 mg at 01/29/18 1056  . Influenza vac split quadrivalent PF (FLUZONE HIGH-DOSE) injection 0.5 mL  0.5 mL Intramuscular Tomorrow-1000 Sainani, Rolly PancakeVivek J, MD      . insulin aspart (novoLOG) injection 0-5 Units  0-5 Units Subcutaneous QHS Oralia ManisWillis, David, MD      . insulin aspart (novoLOG) injection 0-9 Units  0-9 Units Subcutaneous TID WC Oralia ManisWillis, David, MD   2 Units at 01/28/18 1704  . levothyroxine (SYNTHROID, LEVOTHROID) tablet 88 mcg  88 mcg Oral QAC breakfast Oralia ManisWillis, David, MD   88 mcg at 01/29/18 1055  . mirtazapine (REMERON) tablet 15 mg  15 mg Oral Lamont SnowballQHS Willis, David, MD   15 mg at 01/28/18 2152  .  morphine 2 MG/ML injection 2 mg  2 mg Intravenous Q4H PRN Oralia Manis, MD      . ondansetron [facility]) tablet 4 mg  4 mg Oral Q6H PRN Oralia Manis, MD       Or  . ondansetron [facility]) injection 4 mg  4 mg Intravenous Q6H PRN Oralia Manis, MD      . oxyCODONE (Oxy IR/ROXICODONE) immediate release tablet 5 mg  5 mg Oral Q4H PRN Oralia Manis, MD      . ramelteon (ROZEREM) tablet 8 mg  8 mg Oral Lamont Snowball, MD      . sertraline (ZOLOFT) tablet 75 mg  75 mg Oral Daily Oralia Manis, MD   75 mg at 01/29/18 1056  . tamsulosin (FLOMAX) capsule 0.4 mg  0.4 mg Oral Daily Oralia Manis, MD   0.4 mg at 01/29/18 1056  .  traZODone (DESYREL) tablet 50 mg  50 mg Oral Lamont Snowball, MD   50 mg at 01/28/18 2152     Discharge Medications: Please see discharge summary for a list of discharge medications.  Relevant Imaging Results:  Relevant Lab Results:   Additional Information [id number]  Cheron Schaumann, [place]

## 2018-01-30 ENCOUNTER — Inpatient Hospital Stay: Payer: Medicare Other

## 2018-01-30 DIAGNOSIS — E43 Unspecified severe protein-calorie malnutrition: Secondary | ICD-10-CM

## 2018-01-30 LAB — GLUCOSE, CAPILLARY
GLUCOSE-CAPILLARY: 139 mg/dL — AB (ref 70–99)
GLUCOSE-CAPILLARY: 220 mg/dL — AB (ref 70–99)
Glucose-Capillary: 153 mg/dL — ABNORMAL HIGH (ref 70–99)
Glucose-Capillary: 137 mg/dL — ABNORMAL HIGH (ref 70–99)

## 2018-01-30 MED ORDER — LACTULOSE 10 GM/15ML PO SOLN
30.0000 g | Freq: Once | ORAL | Status: AC
  Administered 2018-01-30: 30 g via ORAL
  Filled 2018-01-30: qty 60

## 2018-01-30 MED ORDER — BISACODYL 10 MG RE SUPP
10.0000 mg | Freq: Once | RECTAL | Status: AC
  Administered 2018-01-30: 10 mg via RECTAL
  Filled 2018-01-30: qty 1

## 2018-01-30 MED ORDER — LISINOPRIL 5 MG PO TABS
5.0000 mg | ORAL_TABLET | Freq: Every day | ORAL | Status: DC
  Administered 2018-01-30 – 2018-01-31 (×2): 5 mg via ORAL
  Filled 2018-01-30 (×2): qty 1

## 2018-01-30 MED ORDER — POLYETHYLENE GLYCOL 3350 17 G PO PACK
17.0000 g | PACK | Freq: Every day | ORAL | Status: DC
  Administered 2018-01-30 – 2018-01-31 (×2): 17 g via ORAL
  Filled 2018-01-30 (×2): qty 1

## 2018-01-30 MED ORDER — TRAMADOL HCL 50 MG PO TABS: 50.0000 mg | ORAL_TABLET | Freq: Four times a day (QID) | ORAL | 0 refills | Status: DC | PRN

## 2018-01-30 NOTE — Clinical Social Work Placement (Signed)
   CLINICAL SOCIAL WORK PLACEMENT  NOTE  Date:  01/30/2018  Patient Details  Name: [name] MRN: [id number] Date of Birth: 1928-03-25  Clinical Social Work is seeking post-discharge placement for this patient at the Skilled  Nursing Facility level of care (*CSW will initial, date and re-position this form in  chart as items are completed):  Yes   Patient/family provided with [facility] Clinical Social Work Department's list of facilities offering this level of care within the geographic area requested by the patient (or if unable, by the patient's family).  Yes   Patient/family informed of their freedom to choose among providers that offer the needed level of care, that participate in Medicare, Medicaid or managed care program needed by the patient, have an available bed and are willing to accept the patient.  Yes   Patient/family informed of [facility]'s ownership interest in [facility] and [facility], as well as of the fact that they are under no obligation to receive care at these facilities.  PASRR submitted to EDS on       PASRR number received on       Existing PASRR number confirmed on 01/30/18     FL2 transmitted to all facilities in geographic area requested by pt/family on 01/30/18     FL2 transmitted to all facilities within larger geographic area on       Patient informed that his/her managed care company has contracts with or will negotiate with certain facilities, including the following:        Yes   Patient/family informed of bed offers received.  [facility] chooses bed at ([facility] )     Physician recommends and patient chooses bed at      Patient to be transferred to ([facility] ) on 01/30/18.  Patient to be transferred to facility by [facility] )     Patient family notified on 01/30/18 of transfer.  Name of family member notified:  (Patient's daughter [name] is aware of D/C today. )     PHYSICIAN       Additional Comment:     _______________________________________________ Kyran Whittier, Darleen CrockerBailey M, LCSW 01/30/2018, 3:15 PM

## 2018-01-30 NOTE — Progress Notes (Signed)
Physical Therapy Treatment Patient Details Name: [name] MRN: [id number] DOB: 1928-04-29 Today's Date: 01/30/2018    History of Present Illness Pt is an 84 yo female who presents with LE pain admitted to hospital for closed R hip fx for pain control and bracing. Imaging shows nondisplaced fracture of the proximal femur mostly in the greater trochanteric region.     PT Comments    Pt is pleasant on arrival. Oriented to person, unsure of exactly where she is, time, or exactly why she is here. She keeps stating that she does not want to have surgery and that her hip hurts.. Pt performed supine ther-ex with heavy VC, only limited by pain and cognitive status. Bed mobility requires +2 physical assist to manage trunk and RLE. T/o session pt is confused and required a lot of VC to stay on task. Will progress mobility and strength as able to promote optimal return to PLOF.   Follow Up Recommendations  SNF     Equipment Recommendations  Rolling walker with 5" wheels    Recommendations for Other Services       Precautions / Restrictions Precautions Precaution Comments: Hip abd brace, NWB RLE Required Braces or Orthoses: Other Brace/Splint Other Brace/Splint: Hip Abd Restrictions Weight Bearing Restrictions: Yes RLE Weight Bearing: Non weight bearing    Mobility  Bed Mobility Overal bed mobility: Needs Assistance Bed Mobility: Supine to Sit     Supine to sit: Max assist     General bed mobility comments: Pt required heavy VC to perform supine-to-sit. +2 physical assist to manage trunk and LE. D/t dementia and NWB status pt is unable to progress transfers or amb.  Transfers                    Ambulation/Gait                 Stairs             Wheelchair Mobility    Modified Rankin (Stroke Patients Only)       Balance Overall balance assessment: Needs assistance Sitting-balance support: Bilateral upper extremity supported;Feet  supported Sitting balance-Leahy Scale: Poor Sitting balance - Comments: Pt requires UE support to maintain upright posture. Pt can maintain <10 seconds d/t dementia, not strength of postural musculature.                                    Cognition Arousal/Alertness: Awake/alert Behavior During Therapy: WFL for tasks assessed/performed Overall Cognitive Status: History of cognitive impairments - at baseline                                 General Comments: Pt was confused off and on t/o session, she is oriented to person.      Exercises Other Exercises Other Exercises: Supine BLE AROM SLRs, hip abd/add, ankle pumps, and SAQ. All performed with supervision and heavy VC 10-12 reps.    General Comments        Pertinent Vitals/Pain Pain Assessment: Faces Faces Pain Scale: Hurts even more Pain Location: R anterior hip Pain Descriptors / Indicators: Grimacing;Guarding Pain Intervention(s): Limited activity within patient's tolerance;Monitored during session    Home Living                      Prior Function  PT Goals (current goals can now be found in the care plan section) Acute Rehab PT Goals Patient Stated Goal: No goals stated PT Goal Formulation: Patient unable to participate in goal setting Time For Goal Achievement: 02/12/18 Potential to Achieve Goals: Poor Progress towards PT goals: Progressing toward goals    Frequency    7X/week      PT Plan Frequency needs to be updated    Co-evaluation              AM-PAC PT "6 Clicks" Daily Activity  Outcome Measure  Difficulty turning over in bed (including adjusting bedclothes, sheets and blankets)?: Unable Difficulty moving from lying on back to sitting on the side of the bed? : Unable Difficulty sitting down on and standing up from a chair with arms (e.g., wheelchair, bedside commode, etc,.)?: Unable Help needed moving to and from a bed to chair (including a  wheelchair)?: Total Help needed walking in hospital room?: Total Help needed climbing 3-5 steps with a railing? : Total 6 Click Score: 6    End of Session   Activity Tolerance: Patient limited by pain(Hip brace uncomfortable) Patient left: in bed;with bed alarm set;with call bell/phone within reach Nurse Communication: Mobility status PT Visit Diagnosis: Muscle weakness (generalized) (M62.81);History of falling (Z91.81);Difficulty in walking, not elsewhere classified (R26.2)     Time: 0454-0981 PT Time Calculation (min) (ACUTE ONLY): 26 min  Charges:  $Therapeutic Exercise: 8-22 mins $Therapeutic Activity: 8-22 mins                     Arvilla Meres, SPT  Arvilla Meres 01/30/2018, 11:56 AM

## 2018-01-30 NOTE — Progress Notes (Signed)
[facility] administrator came to visit patient today and stated that she can't accept patient back because she needs a higher level of care. FL2 complete and faxed out. CSW met with patient and made her aware of above. CSW presented bed offers to patient and she chose [facility].   Patient is medically stable for D/C to [facility] today. Per Otila Kluver [facility] liaison patient can come today to room 504. RN will call report and arrange EMS for transport. CSW sent D/C orders to [facility] via [facility]. Patient is aware of above. CSW contacted patient's daughter Leda Gauze and made her aware of above. Please reconsult if future social work needs arise. CSW signing off.   [employer], LCSW [phone]

## 2018-01-30 NOTE — [place] FL2 (Signed)
[place] MEDICAID FL2 LEVEL OF CARE SCREENING TOOL     IDENTIFICATION  Patient Name: [name] Birthdate: [birth year] Sex: female Admission Date (Current Location): 01/27/2018  [place] and [place] Number:  [address] ([id number]) Facility and Address:  [facility], [address], [place], [place] [address]      Provider Number: [id number]  Attending Physician Name and Address:  Houston Siren, MD  Relative Name and Phone Number:       Current Level of Care: Hospital Recommended Level of Care: Skilled Nursing Facility Prior Approval Number:    Date Approved/Denied:   PASRR Number: [id number])  Discharge Plan: SNF    Current Diagnoses: Patient Active Problem List   Diagnosis Date Noted  . Hypothyroidism 01/27/2018  . HTN (hypertension) 01/27/2018  . Diabetes (HCC) 01/27/2018  . Pressure ulcer 04/12/2015  . Gangrene (HCC) 04/11/2015  . Urinary retention 02/04/2015  . Atelectasis 01/10/2015  . Acute encephalopathy 01/10/2015  . Dementia 01/10/2015  . Closed right hip fracture (HCC) 01/07/2015    Orientation RESPIRATION BLADDER Height & Weight     Self  Normal Continent Weight: 103 lb (46.7 kg)(low bed scale) Height:  5\' 5"  (165.1 cm)  BEHAVIORAL SYMPTOMS/MOOD NEUROLOGICAL BOWEL NUTRITION STATUS      Continent Diet(Diet: DYS 3 )  AMBULATORY STATUS COMMUNICATION OF NEEDS Skin   Extensive Assist Verbally Surgical wounds, PU Stage and Appropriate Care(Unstagebale pressure ulcer on left heel. )                       Personal Care Assistance Level of Assistance  Bathing, Feeding, Dressing Bathing Assistance: Limited assistance Feeding assistance: Independent Dressing Assistance: Limited assistance Total Care Assistance: Maximum assistance   Functional Limitations Info  Sight, Hearing, Speech Sight Info: Adequate Hearing Info: Adequate Speech Info: Adequate    SPECIAL CARE FACTORS FREQUENCY  PT (By licensed PT), OT  (By licensed OT)     PT Frequency: (5) OT Frequency: (5)            Contractures Contractures Info: Not present    Additional Factors Info  Code Status, Allergies Code Status Info: (Full Code. ) Allergies Info: (No Known Allergies. )           Current Medications (01/30/2018):  This is the current hospital active medication list Current Facility-Administered Medications  Medication Dose Route Frequency Provider Last Rate Last Dose  . acetaminophen (TYLENOL) tablet 650 mg  650 mg Oral Q6H PRN Oralia Manis, MD   650 mg at 01/30/18 0855   Or  . acetaminophen (TYLENOL) suppository 650 mg  650 mg Rectal Q6H PRN Oralia Manis, MD      . enoxaparin (LOVENOX) injection 40 mg  40 mg Subcutaneous Q24H Oralia Manis, MD   40 mg at 01/29/18 2119  . feeding supplement (ENSURE ENLIVE) (ENSURE ENLIVE) liquid 237 mL  237 mL Oral TID WC Houston Siren, MD   237 mL at 01/29/18 1722  . gabapentin (NEURONTIN) capsule 100 mg  100 mg Oral Daily Oralia Manis, MD   100 mg at 01/30/18 0854  . Influenza vac split quadrivalent PF (FLUZONE HIGH-DOSE) injection 0.5 mL  0.5 mL Intramuscular Tomorrow-1000 Sainani, Rolly Pancake, MD      . insulin aspart (novoLOG) injection 0-5 Units  0-5 Units Subcutaneous QHS Oralia Manis, MD   2 Units at 01/29/18 2118  . insulin aspart (novoLOG) injection 0-9 Units  0-9 Units Subcutaneous TID WC Oralia Manis, MD   1 Units  at 01/30/18 0853  . levothyroxine (SYNTHROID, LEVOTHROID) tablet 88 mcg  88 mcg Oral QAC breakfast Oralia ManisWillis, David, MD   88 mcg at 01/30/18 0854  . mirtazapine (REMERON) tablet 15 mg  15 mg Oral Lamont SnowballQHS Willis, David, MD   15 mg at 01/29/18 2118  . morphine 2 MG/ML injection 2 mg  2 mg Intravenous Q4H PRN Oralia ManisWillis, David, MD      . multivitamin with minerals tablet 1 tablet  1 tablet Oral Daily Houston SirenSainani, Vivek J, MD   1 tablet at 01/30/18 0855  . ondansetron (ZOFRAN) tablet 4 mg  4 mg Oral Q6H PRN Oralia ManisWillis, David, MD       Or  . ondansetron [facility](ZOFRAN) injection 4 mg  4  mg Intravenous Q6H PRN Oralia ManisWillis, David, MD      . oxyCODONE (Oxy IR/ROXICODONE) immediate release tablet 5 mg  5 mg Oral Q4H PRN Oralia ManisWillis, David, MD      . ramelteon (ROZEREM) tablet 8 mg  8 mg Oral Lamont SnowballQHS Willis, David, MD   8 mg at 01/29/18 2117  . sertraline (ZOLOFT) tablet 75 mg  75 mg Oral Daily Oralia ManisWillis, David, MD   75 mg at 01/30/18 0855  . tamsulosin (FLOMAX) capsule 0.4 mg  0.4 mg Oral Daily Oralia ManisWillis, David, MD   0.4 mg at 01/30/18 0855  . traZODone (DESYREL) tablet 50 mg  50 mg Oral Lamont SnowballQHS Willis, David, MD   50 mg at 01/29/18 2118     Discharge Medications: Please see discharge summary for a list of discharge medications.  Relevant Imaging Results:  Relevant Lab Results:   Additional Information (SSN: [id number])  Fara Worthy, Darleen CrockerBailey M, LCSW

## 2018-01-30 NOTE — Progress Notes (Signed)
   01/30/18 1830  Vitals  Temp (!) 101 F (38.3 C)  Temp Source Oral  BP (!) 155/90  BP Location Right Arm  BP Method Automatic  Patient Position (if appropriate) Lying  Pulse Rate 89  Pulse Rate Source Dinamap  Resp 16  Oxygen Therapy  SpO2 99 %  O2 Device Room Air    EMS arrived to pick up pt. Elevated temp with VS. RN paged on call MD. Asked to keep the pt overnight. New verbal orders for blood cultures, urine cultures, and CXR. Tylenol administered. Will continue to monitor.

## 2018-01-30 NOTE — Discharge Summary (Addendum)
[facility] [place] [facility]   PATIENT NAME: [name]    MR#:  [id number]  DATE OF BIRTH:  Nov 19, 1927  DATE OF ADMISSION:  01/27/2018 ADMITTING PHYSICIAN: Campbell Stall, MD  DATE OF DISCHARGE: 01/31/2018  PRIMARY CARE PHYSICIAN: Clemetine Marker, FNP    ADMISSION DIAGNOSIS:  Closed fracture of right hip, initial encounter (HCC) [S72.001A]  DISCHARGE DIAGNOSIS:  Principal Problem:   Closed right hip fracture (HCC) Active Problems:   Dementia   Hypothyroidism   HTN (hypertension)   Diabetes (HCC)   SECONDARY DIAGNOSIS:   Past Medical History:  Diagnosis Date  . Alzheimer's dementia   . Diabetes type 2, controlled (HCC)   . Hearing impairment   . Hypertension   . Hypothyroidism   . Unsteady gait   . Vision impairment     HOSPITAL COURSE:   84 year old female with past medical history of dementia, diabetes, hypertension, hypothyroidism who presents to the hospital after a fall and noted to have a right hip periprosthetic fracture.  1.  Status post fall with a right hip periprosthetic fracture- patient has had a previous prosthesis and now developed a fracture proximal to it. - Seen by orthopedics and they do not think this is operable and they recommend conservative management. Right hip is now in a immobilizer.   - Patient should be nonweightbearing on that leg for 6 weeks.  Oral Tramadol for pain control and follow up with Orthopedics in 2 weeks.   2.  Diabetes type 2 without complication-while in the hospital patient was on sliding scale insulin, but will resume her metformin upon discharge now.  3.  Diabetic neuropathy- pt. Will continue gabapentin.  4.  Hypothyroidism- pt. Will continue Synthroid.  5.  Depression- she will continue her Zoloft, Remeron, trazodone.  6.  Constipation- improved and resolved with Miralax and Dulcolax.   7. Fever - pt. Had a fever yesterday but has resolved now.  CXR (-) and BC have been (-).   No acute pathology and likely related to atelectasis.    - Seen by physical therapy and the recommend short-term rehab.  Patient is from an assisted living and therefore now being discharged to a skilled nursing facility.  DISCHARGE CONDITIONS:   Stable  CONSULTS OBTAINED:    DRUG ALLERGIES:  No Known Allergies  DISCHARGE MEDICATIONS:   Allergies as of 01/30/2018   No Known Allergies     Medication List    TAKE these medications   acetaminophen 500 MG tablet Commonly known as:  TYLENOL Take 500 mg by mouth at bedtime.   dicyclomine 10 MG capsule Commonly known as:  BENTYL Take 10 mg by mouth 3 (three) times daily before meals.   gabapentin 100 MG capsule Commonly known as:  NEURONTIN Take 100 mg by mouth daily.   levothyroxine 88 MCG tablet Commonly known as:  SYNTHROID, LEVOTHROID Take 88 mcg by mouth daily.   lisinopril 5 MG tablet Commonly known as:  PRINIVIL,ZESTRIL Take 5 mg by mouth daily.   loperamide 2 MG capsule Commonly known as:  IMODIUM See admin instructions. Take 2 capsules (4MG ) by mouth after first loose stool and 1 capsule (2MG ) by mouth after each subsequent loose stool - max 16MG  daily   loratadine 10 MG tablet Commonly known as:  CLARITIN Take 10 mg by mouth daily.   metFORMIN 500 MG tablet Commonly known as:  GLUCOPHAGE Take 500 mg by mouth 2 (two) times daily with a meal.   mirtazapine  15 MG tablet Commonly known as:  REMERON Take 15 mg by mouth at bedtime.   sertraline 50 MG tablet Commonly known as:  ZOLOFT Take 75 mg by mouth daily.   tamsulosin 0.4 MG Caps capsule Commonly known as:  FLOMAX Take 1 capsule (0.4 mg total) by mouth daily.   traMADol 50 MG tablet Commonly known as:  ULTRAM Take 1 tablet (50 mg total) by mouth every 6 (six) hours as needed.   traZODone 50 MG tablet Commonly known as:  DESYREL Take 50 mg by mouth at bedtime.         DISCHARGE INSTRUCTIONS:   DIET:  Cardiac diet and Diabetic  diet  DISCHARGE CONDITION:  Stable  ACTIVITY:  Activity as tolerated  Non-weight bearing on RLE for 6 weeks.  OXYGEN:  Home Oxygen: No.   Oxygen Delivery: room air  DISCHARGE LOCATION:  nursing home   If you experience worsening of your admission symptoms, develop shortness of breath, life threatening emergency, suicidal or homicidal thoughts you must seek medical attention immediately by calling 911 or calling your MD immediately  if symptoms less severe.  You Must read complete instructions/literature along with all the possible adverse reactions/side effects for all the Medicines you take and that have been prescribed to you. Take any new Medicines after you have completely understood and accpet all the possible adverse reactions/side effects.   Please note  You were cared for by a hospitalist during your hospital stay. If you have any questions about your discharge medications or the care you received while you were in the hospital after you are discharged, you can call the unit and asked to speak with the hospitalist on call if the hospitalist that took care of you is not available. Once you are discharged, your primary care physician will handle any further medical issues. Please note that NO REFILLS for any discharge medications will be authorized once you are discharged, as it is imperative that you return to your primary care physician (or establish a relationship with a primary care physician if you do not have one) for your aftercare needs so that they can reassess your need for medications and monitor your lab values.     Today   No acute events overnight, patient denies any significant right hip pain. Afebrile and will d/c to SNF today.   VITAL SIGNS:  Blood pressure (!) 142/48, pulse 80, temperature 99 F (37.2 C), temperature source Oral, resp. rate 19, height 5\' 5"  (1.651 m), weight 46.7 kg, SpO2 96 %.  I/O:    Intake/Output Summary (Last 24 hours) at 01/30/2018  1408 Last data filed at 01/30/2018 0930 Gross per 24 hour  Intake 240 ml  Output 200 ml  Net 40 ml    PHYSICAL EXAMINATION:   GENERAL:  84 y.o.-year-old thin patient lying in bed in no acute distress.  EYES: Pupils equal, round, reactive to light and accommodation. No scleral icterus. Extraocular muscles intact.  HEENT: Head atraumatic, normocephalic. Oropharynx and nasopharynx clear.  NECK:  Supple, no jugular venous distention. No thyroid enlargement, no tenderness.  LUNGS: Poor Resp. effort, no wheezing, rales, rhonchi. No use of accessory muscles of respiration.  CARDIOVASCULAR: S1, S2 normal. No murmurs, rubs, or gallops.  ABDOMEN: Soft, nontender, nondistended. Bowel sounds present. No organomegaly or mass.  EXTREMITIES: No cyanosis, clubbing or edema b/l. Right Hip in a Immobilizer.   NEUROLOGIC: Cranial nerves II through XII are intact. No focal Motor or sensory deficits b/l. Globally weak  PSYCHIATRIC: The patient is alert and oriented x 1. SKIN: No obvious rash, lesion, or ulcer.   DATA REVIEW:   CBC Recent Labs  Lab 01/28/18 0532  WBC 8.9  HGB 10.1*  HCT 29.1*  PLT 213    Chemistries  Recent Labs  Lab 01/28/18 0532  NA 142  K 4.6  CL 104  CO2 29  GLUCOSE 126*  BUN 19  CREATININE 0.81  CALCIUM 8.7*    Cardiac Enzymes No results for input(s): TROPONINI in the last 168 hours.  Microbiology Results  Results for orders placed or performed during the hospital encounter of 01/27/18  Surgical pcr screen     Status: None   Collection Time: 01/27/18 11:04 PM  Result Value Ref Range Status   MRSA, PCR NEGATIVE NEGATIVE Final   Staphylococcus aureus NEGATIVE NEGATIVE Final    Comment: (NOTE) The Xpert SA Assay (FDA approved for NASAL specimens in patients 81 years of age and older), is one component of a comprehensive surveillance program. It is not intended to diagnose infection nor to guide or monitor treatment. Performed at [facility], [address] [place], [place] [address]     RADIOLOGY:  No results found.    Management plans discussed with the patient, family and they are in agreement.  CODE STATUS:     Code Status Orders  (From admission, onward)         Start     Ordered   01/27/18 2302  Full code  Continuous     01/27/18 2301         TOTAL TIME TAKING CARE OF THIS PATIENT: 40 minutes.    Houston Siren M.D on 01/30/2018 at 2:08 PM  Between 7am to 6pm - Pager - [phone]  After 6pm go to [email or web address] - Social research officer, government  [facility]  Office  [phone]  CC: Primary care physician; Clemetine Marker, FNP

## 2018-01-30 NOTE — Care Management Important Message (Signed)
Important Message  Patient Details  Name: [name] MRN: [id number] Date of Birth: [birth year]   Medicare Important Message Given:  Yes Reviewed Important Message from [employer] with patient and left a copy with the patient. Patient was not able to sign but was able to make an X on the form.   Olegario MessierKathy A Tam Savoia 01/30/2018, 11:56 AM

## 2018-01-31 LAB — GLUCOSE, CAPILLARY
GLUCOSE-CAPILLARY: 133 mg/dL — AB (ref 70–99)
GLUCOSE-CAPILLARY: 144 mg/dL — AB (ref 70–99)

## 2018-01-31 MED ORDER — BISACODYL 10 MG RE SUPP
10.0000 mg | Freq: Every day | RECTAL | Status: DC | PRN
  Administered 2018-01-31: 10 mg via RECTAL
  Filled 2018-01-31: qty 1

## 2018-01-31 NOTE — Clinical Social Work Placement (Signed)
   CLINICAL SOCIAL WORK PLACEMENT  NOTE  Date:  01/31/2018  Patient Details  Name: [name] MRN: [id number] Date of Birth: 01/14/1928  Clinical Social Work is seeking post-discharge placement for this patient at the Skilled  Nursing Facility level of care (*CSW will initial, date and re-position this form in  chart as items are completed):  Yes   Patient/family provided with [place] Clinical Social Work Department's list of facilities offering this level of care within the geographic area requested by the patient (or if unable, by the patient's family).  Yes   Patient/family informed of their freedom to choose among providers that offer the needed level of care, that participate in Medicare, Medicaid or managed care program needed by the patient, have an available bed and are willing to accept the patient.  Yes   Patient/family informed of 's ownership interest [facility] and [facility], as well as of the fact that they are under no obligation to receive care at these facilities.  PASRR submitted to EDS on       PASRR number received on       Existing PASRR number confirmed on 01/30/18     FL2 transmitted to all facilities in geographic area requested by pt/family on 01/30/18     FL2 transmitted to all facilities within larger geographic area on       Patient informed that his/her managed care company has contracts with or will negotiate with certain facilities, including the following:        Yes   Patient/family informed of bed offers received.  Patient chooses bed at ([facility] )     Physician recommends and patient chooses bed at      Patient to be transferred to ([facility] ) on 01/31/18.  Patient to be transferred to facility by [facility] )     Patient family notified on 01/31/18 of transfer.  Name of family member notified:  (CSW left patient's daughter [name] a Engineer, technical salesvoicemail. )     PHYSICIAN       Additional Comment:     _______________________________________________ Audreyana Huntsberry, Darleen CrockerBailey M, LCSW 01/31/2018, 11:24 AM

## 2018-01-31 NOTE — Progress Notes (Signed)
Per chart patient had a temp when EMS came to pick up patient. Clinical Child psychotherapistocial Worker (CSW) notified [facility] liaison.   [employer] Hughes IncorporatedBailey Arne Schlender, LCSW [phone]

## 2018-01-31 NOTE — Progress Notes (Signed)
Physical Therapy Treatment Patient Details Name: [name] MRN: [id number] DOB: [birth year] Today's Date: 01/31/2018    History of Present Illness Pt is an 84 yo female who presents with LE pain admitted to hospital for closed R hip fx for pain control and bracing. Imaging shows nondisplaced fracture of the proximal femur mostly in the greater trochanteric region.     PT Comments    Pt is pleasant and agreeable to PT. Pt performed increased reps of ther-ex with frequent tactile and VC for sequencing/technique. Pt progressed bed mobility demonstrating the ability to control LEs from supine-to-sit, max physical assist for trunk control, no longer +2. Unable to progress transfers and amb d/t dementia. Pt still confused t/o session and requires a lot of VC to stay on task. Will progress mobility as able to promote optimal return to PLOF.   Follow Up Recommendations  SNF     Equipment Recommendations  Rolling walker with 5" wheels    Recommendations for Other Services       Precautions / Restrictions Precautions Precaution Comments: Hip abd brace, NWB RLE Required Braces or Orthoses: Other Brace/Splint Other Brace/Splint: Hip Abd Restrictions Weight Bearing Restrictions: Yes RLE Weight Bearing: Non weight bearing    Mobility  Bed Mobility Overal bed mobility: Needs Assistance Bed Mobility: Supine to Sit     Supine to sit: Max assist     General bed mobility comments: Pt needed max assist to manage trunk, but demonstrated ability to manage LEs without physical assist. VC for sequencing. D/t dementia and NWB status pt is unable to progress transfers or amb.  Transfers                    Ambulation/Gait                 Stairs             Wheelchair Mobility    Modified Rankin (Stroke Patients Only)       Balance Overall balance assessment: Needs assistance Sitting-balance support: Bilateral upper extremity supported;Feet  supported Sitting balance-Leahy Scale: Fair Sitting balance - Comments: Pt requires single UE support to maintain upright posture. Demonstrates ability to maintain upright posture for <30 sec. VC required intermittently to dec post and lateral lean. Postural control: Posterior lean;Right lateral lean                                  Cognition Arousal/Alertness: Awake/alert Behavior During Therapy: WFL for tasks assessed/performed Overall Cognitive Status: History of cognitive impairments - at baseline                                 General Comments: Pt was confused off and on t/o session, she is oriented to person.      Exercises Other Exercises Other Exercises: Supine BLE AROM SLRs, hip abd/add, ankle pumps, and SAQ. All performed with supervision and heavy VC 12 reps. Other Exercises: Supine BUE reaching to promote trunk rotation/disassociation required for mobility.  Other Exercises: Seated LUE anterior lateral reaching (high fiving) working on dec posterior R lateral lean.    General Comments        Pertinent Vitals/Pain Pain Assessment: Faces Faces Pain Scale: Hurts little more(Pain with movement) Pain Location: R anterior hip Pain Descriptors / Indicators: Grimacing;Guarding Pain Intervention(s): Limited activity within patient's tolerance;Monitored during session  Home Living                      Prior Function            PT Goals (current goals can now be found in the care plan section) Acute Rehab PT Goals Patient Stated Goal: No goals stated PT Goal Formulation: Patient unable to participate in goal setting Time For Goal Achievement: 02/12/18 Potential to Achieve Goals: Poor Progress towards PT goals: Progressing toward goals    Frequency    7X/week      PT Plan Current plan remains appropriate    Co-evaluation              AM-PAC PT "6 Clicks" Daily Activity  Outcome Measure  Difficulty turning over  in bed (including adjusting bedclothes, sheets and blankets)?: Unable Difficulty moving from lying on back to sitting on the side of the bed? : Unable Difficulty sitting down on and standing up from a chair with arms (e.g., wheelchair, bedside commode, etc,.)?: Unable Help needed moving to and from a bed to chair (including a wheelchair)?: Total Help needed walking in hospital room?: Total Help needed climbing 3-5 steps with a railing? : Total 6 Click Score: 6    End of Session Equipment Utilized During Treatment: Gait belt Activity Tolerance: Patient tolerated treatment well Patient left: in bed;with call bell/phone within reach;with bed alarm set Nurse Communication: Mobility status PT Visit Diagnosis: Muscle weakness (generalized) (M62.81);History of falling (Z91.81);Difficulty in walking, not elsewhere classified (R26.2)     Time: [id number] PT Time Calculation (min) (ACUTE ONLY): 24 min  Charges:                        Arvilla MeresSarah Diaz Crago, SPT    Arvilla MeresSarah Shequita Peplinski 01/31/2018, 1:21 PM

## 2018-01-31 NOTE — Progress Notes (Signed)
[facility] - [place] at [facility]   PATIENT NAME: [name]    MR#:  [id number]  DATE OF BIRTH:  [birth year]  SUBJECTIVE:   No acute events overnight, seen by orthopedics and a immobilizer placed on the right hip.  Denies any right hip pain presently.  REVIEW OF SYSTEMS:    Review of Systems  Unable to perform ROS: Mental acuity    Nutrition: Heart Healthy/Carb control Tolerating Diet: Yes Tolerating PT: Await Eval.   DRUG ALLERGIES:  No Known Allergies  VITALS:  Blood pressure (!) 150/55, pulse 74, temperature 97.9 F (36.6 C), temperature source Oral, resp. rate 18, height 5\' 5"  (1.651 m), weight 46.7 kg, SpO2 98 %.  PHYSICAL EXAMINATION:   Physical Exam  GENERAL:  84 y.o.-year-old thin patient lying in bed in no acute distress.  EYES: Pupils equal, round, reactive to light and accommodation. No scleral icterus. Extraocular muscles intact.  HEENT: Head atraumatic, normocephalic. Oropharynx and nasopharynx clear.  NECK:  Supple, no jugular venous distention. No thyroid enlargement, no tenderness.  LUNGS: Poor Resp. effort, no wheezing, rales, rhonchi. No use of accessory muscles of respiration.  CARDIOVASCULAR: S1, S2 normal. No murmurs, rubs, or gallops.  ABDOMEN: Soft, nontender, nondistended. Bowel sounds present. No organomegaly or mass.  EXTREMITIES: No cyanosis, clubbing or edema b/l. Right Hip in a Immobilizer.   NEUROLOGIC: Cranial nerves II through XII are intact. No focal Motor or sensory deficits b/l. Globally weak   PSYCHIATRIC: The patient is alert and oriented x 1. SKIN: No obvious rash, lesion, or ulcer.    LABORATORY PANEL:   CBC Recent Labs  Lab 01/28/18 0532  WBC 8.9  HGB 10.1*  HCT 29.1*  PLT 213   ------------------------------------------------------------------------------------------------------------------  Chemistries  Recent Labs  Lab 01/28/18 0532  NA 142  K 4.6  CL 104  CO2 29  GLUCOSE 126*  BUN 19   CREATININE 0.81  CALCIUM 8.7*   ------------------------------------------------------------------------------------------------------------------  Cardiac Enzymes No results for input(s): TROPONINI in the last 168 hours. ------------------------------------------------------------------------------------------------------------------  RADIOLOGY:  Dg Chest 2 View  Result Date: 01/30/2018 CLINICAL DATA:  Fever EXAM: CHEST - 2 VIEW COMPARISON:  Chest x-ray dated 01/27/2017. FINDINGS: Heart size and mediastinal contours are stable. Stable streaky opacities at the LEFT lung base, likely chronic scarring or atelectasis. No new lung findings. No new confluent opacity to suggest a developing pneumonia. No pleural effusion. No pneumothorax seen. Prominent skin folds overlie the LEFT chest. IMPRESSION: No active cardiopulmonary disease. No evidence of pneumonia or pulmonary edema. Electronically Signed   By: Bary Richard M.D.   On: 01/30/2018 20:18     ASSESSMENT AND PLAN:   84 year old female with past medical history of dementia, diabetes, hypertension, hypothyroidism who presents to the hospital after a fall and noted to have a right hip periprosthetic fracture.  1.  Status post fall with a right hip periprosthetic fracture- patient has had a previous prosthesis and now develops a fracture proximal to it. - Seen by orthopedics and they do not think this is operable and they recommend conservative management. Right hip now in a immobilizer.   - Patient should be nonweightbearing on that leg for 6 weeks, continue physical therapy as tolerated and pain control. Follow-up with orthopedics in the next 2 weeks.  2.  Diabetes type 2 without complication-continue sliding scale insulin.   - BS stable   3.  Diabetic neuropathy-continue gabapentin.  4.  Hypothyroidism-continue Synthroid.  5.  Depression-continue Zoloft.  Continue trazodone for  sleep along with Remeron.    All the records are  reviewed and case discussed with Care Management/Social Worker. Management plans discussed with the patient, family and they are in agreement.  CODE STATUS: Full code  DVT Prophylaxis: Lovenox  TOTAL TIME TAKING CARE OF THIS PATIENT: 25 minutes.   POSSIBLE D/C IN 1-2 DAYS, DEPENDING ON CLINICAL CONDITION.   Houston SirenSAINANI,VIVEK J M.D on 01/31/2018 at 11:30 AM  Between 7am to 6pm - Pager - [phone]  After 6pm go to [email or web address] - Social research officer, governmentpassword [email or web address]  [facility] [place] [facility]  Office  [phone]  CC: Primary care physician; Clemetine Markerampbell, Margaret P, FNP

## 2018-01-31 NOTE — Progress Notes (Signed)
Per MD patient is stable for D/C to [facility] today. Per Inetta Fermoina [facility] liaison patient can come today. RN will call report and arrange EMS for transport. Patient is aware of above. Clinical Child psychotherapistocial Worker (CSW) left patient's daughter [name] a Engineer, technical salesvoicemail.   Baker Hughes IncorporatedBailey Bhakti Labella, LCSW [phone]

## 2018-01-31 NOTE — Progress Notes (Signed)
Pt discharged to [facility] via non emergency transport via stretcher per MD order without incident. Prior to d/c, pt had a large BM. No change in patient from AM assessment. No signs or symptoms of distress upon discharge. [facility].

## 2018-02-02 LAB — URINE CULTURE: Culture: >=100000 — AB

## 2018-02-04 LAB — CULTURE, BLOOD (ROUTINE X 2)
CULTURE: NO GROWTH
CULTURE: NO GROWTH
Special Requests: ADEQUATE
Special Requests: ADEQUATE

## 2018-03-25 ENCOUNTER — Encounter: Payer: Self-pay | Admitting: Emergency Medicine

## 2018-03-25 ENCOUNTER — Inpatient Hospital Stay
Admission: EM | Admit: 2018-03-25 | Discharge: 2018-03-27 | DRG: 872 | Disposition: A | Discharge status: Hospice | Payer: Medicare Other | Attending: Internal Medicine | Admitting: Internal Medicine

## 2018-03-25 ENCOUNTER — Emergency Department: Payer: Medicare Other

## 2018-03-25 ENCOUNTER — Other Ambulatory Visit: Payer: Self-pay

## 2018-03-25 DIAGNOSIS — N179 Acute kidney failure, unspecified: Secondary | ICD-10-CM | POA: Diagnosis present

## 2018-03-25 DIAGNOSIS — E039 Hypothyroidism, unspecified: Secondary | ICD-10-CM | POA: Diagnosis present

## 2018-03-25 DIAGNOSIS — E1140 Type 2 diabetes mellitus with diabetic neuropathy, unspecified: Secondary | ICD-10-CM | POA: Diagnosis present

## 2018-03-25 DIAGNOSIS — L8990 Pressure ulcer of unspecified site, unspecified stage: Secondary | ICD-10-CM

## 2018-03-25 DIAGNOSIS — H9190 Unspecified hearing loss, unspecified ear: Secondary | ICD-10-CM | POA: Diagnosis present

## 2018-03-25 DIAGNOSIS — Z7984 Long term (current) use of oral hypoglycemic drugs: Secondary | ICD-10-CM

## 2018-03-25 DIAGNOSIS — Z96641 Presence of right artificial hip joint: Secondary | ICD-10-CM | POA: Diagnosis present

## 2018-03-25 DIAGNOSIS — I248 Other forms of acute ischemic heart disease: Secondary | ICD-10-CM | POA: Diagnosis present

## 2018-03-25 DIAGNOSIS — R627 Adult failure to thrive: Secondary | ICD-10-CM | POA: Diagnosis present

## 2018-03-25 DIAGNOSIS — E860 Dehydration: Secondary | ICD-10-CM | POA: Diagnosis present

## 2018-03-25 DIAGNOSIS — Z6820 Body mass index (BMI) 20.0-20.9, adult: Secondary | ICD-10-CM | POA: Diagnosis not present

## 2018-03-25 DIAGNOSIS — F329 Major depressive disorder, single episode, unspecified: Secondary | ICD-10-CM | POA: Diagnosis present

## 2018-03-25 DIAGNOSIS — F0280 Dementia in other diseases classified elsewhere without behavioral disturbance: Secondary | ICD-10-CM | POA: Diagnosis present

## 2018-03-25 DIAGNOSIS — G309 Alzheimer's disease, unspecified: Secondary | ICD-10-CM | POA: Diagnosis present

## 2018-03-25 DIAGNOSIS — R531 Weakness: Secondary | ICD-10-CM | POA: Diagnosis present

## 2018-03-25 DIAGNOSIS — Z79899 Other long term (current) drug therapy: Secondary | ICD-10-CM

## 2018-03-25 DIAGNOSIS — Z515 Encounter for palliative care: Secondary | ICD-10-CM | POA: Diagnosis present

## 2018-03-25 DIAGNOSIS — N3090 Cystitis, unspecified without hematuria: Secondary | ICD-10-CM | POA: Diagnosis present

## 2018-03-25 DIAGNOSIS — I10 Essential (primary) hypertension: Secondary | ICD-10-CM | POA: Diagnosis present

## 2018-03-25 DIAGNOSIS — F0390 Unspecified dementia without behavioral disturbance: Secondary | ICD-10-CM | POA: Diagnosis present

## 2018-03-25 DIAGNOSIS — A419 Sepsis, unspecified organism: Secondary | ICD-10-CM | POA: Diagnosis present

## 2018-03-25 DIAGNOSIS — R64 Cachexia: Secondary | ICD-10-CM | POA: Diagnosis present

## 2018-03-25 DIAGNOSIS — F03C0 Unspecified dementia, severe, without behavioral disturbance, psychotic disturbance, mood disturbance, and anxiety: Secondary | ICD-10-CM

## 2018-03-25 DIAGNOSIS — Z7189 Other specified counseling: Secondary | ICD-10-CM | POA: Diagnosis not present

## 2018-03-25 HISTORY — DX: Cellulitis of left lower limb: L03.116

## 2018-03-25 LAB — COMPREHENSIVE METABOLIC PANEL
ALK PHOS: 58 U/L (ref 38–126)
ALT: 11 U/L (ref 0–44)
ANION GAP: 14 (ref 5–15)
AST: 25 U/L (ref 15–41)
Albumin: 3.7 g/dL (ref 3.5–5.0)
BILIRUBIN TOTAL: 0.7 mg/dL (ref 0.3–1.2)
BUN: 108 mg/dL — ABNORMAL HIGH (ref 8–23)
CALCIUM: 9.1 mg/dL (ref 8.9–10.3)
CO2: 23 mmol/L (ref 22–32)
Chloride: 96 mmol/L — ABNORMAL LOW (ref 98–111)
Creatinine, Ser: 1.88 mg/dL — ABNORMAL HIGH (ref 0.44–1.00)
GFR calc non Af Amer: 23 mL/min — ABNORMAL LOW (ref >60)
GFR, EST AFRICAN AMERICAN: 26 mL/min — AB (ref >60)
Glucose, Bld: 198 mg/dL — ABNORMAL HIGH (ref 70–99)
Potassium: 5.5 mmol/L — ABNORMAL HIGH (ref 3.5–5.1)
Sodium: 133 mmol/L — ABNORMAL LOW (ref 135–145)
Total Protein: 7.4 g/dL (ref 6.5–8.1)

## 2018-03-25 LAB — CBC WITH DIFFERENTIAL/PLATELET
Abs Immature Granulocytes: 0.03 10*3/uL (ref 0.00–0.07)
Basophils Absolute: 0 10*3/uL (ref 0.0–0.1)
Basophils Relative: 0 %
EOS ABS: 0 10*3/uL (ref 0.0–0.5)
EOS PCT: 0 %
HCT: 35.5 % — ABNORMAL LOW (ref 36.0–46.0)
HEMOGLOBIN: 11.7 g/dL — AB (ref 12.0–15.0)
Immature Granulocytes: 0 %
Lymphocytes Relative: 27 %
Lymphs Abs: 1.9 10*3/uL (ref 0.7–4.0)
MCH: 28.6 pg (ref 26.0–34.0)
MCHC: 33 g/dL (ref 30.0–36.0)
MCV: 86.8 fL (ref 80.0–100.0)
MONO ABS: 0.3 10*3/uL (ref 0.1–1.0)
Monocytes Relative: 4 %
Neutro Abs: 5 10*3/uL (ref 1.7–7.7)
Neutrophils Relative %: 69 %
PLATELETS: 316 10*3/uL (ref 150–400)
RBC: 4.09 MIL/uL (ref 3.87–5.11)
RDW: 13.6 % (ref 11.5–15.5)
WBC: 7.3 10*3/uL (ref 4.0–10.5)
nRBC: 0 % (ref 0.0–0.2)

## 2018-03-25 LAB — URINALYSIS, COMPLETE (UACMP) WITH MICROSCOPIC
Bilirubin Urine: NEGATIVE
Glucose, UA: NEGATIVE mg/dL
Ketones, ur: NEGATIVE mg/dL
Leukocytes, UA: NEGATIVE
Nitrite: NEGATIVE
Protein, ur: 100 mg/dL — AB
RBC / HPF: >50 RBC/hpf — ABNORMAL HIGH (ref 0–5)
Specific Gravity, Urine: 1.015 (ref 1.005–1.030)
Squamous Epithelial / HPF: NONE SEEN (ref 0–5)
WBC, UA: >50 WBC/hpf — ABNORMAL HIGH (ref 0–5)
pH: 8 (ref 5.0–8.0)

## 2018-03-25 LAB — GLUCOSE, CAPILLARY: Glucose-Capillary: 95 mg/dL (ref 70–99)

## 2018-03-25 LAB — TROPONIN I
TROPONIN I: 0.12 ng/mL — AB (ref <0.03)
TROPONIN I: 0.13 ng/mL — AB (ref <0.03)
TROPONIN I: 0.14 ng/mL — AB (ref <0.03)

## 2018-03-25 LAB — TSH: TSH: 14.594 u[IU]/mL — ABNORMAL HIGH (ref 0.350–4.500)

## 2018-03-25 LAB — CG4 I-STAT (LACTIC ACID): LACTIC ACID, VENOUS: 2.14 mmol/L — AB (ref 0.5–1.9)

## 2018-03-25 LAB — T4, FREE: Free T4: 0.78 ng/dL — ABNORMAL LOW (ref 0.82–1.77)

## 2018-03-25 LAB — LIPASE, BLOOD: LIPASE: 28 U/L (ref 11–51)

## 2018-03-25 LAB — LACTIC ACID, PLASMA: LACTIC ACID, VENOUS: 1.5 mmol/L (ref 0.5–1.9)

## 2018-03-25 LAB — PROCALCITONIN: PROCALCITONIN: 0.1 ng/mL

## 2018-03-25 MED ORDER — SODIUM CHLORIDE 0.9 % IV BOLUS (SEPSIS)
1000.0000 mL | Freq: Once | INTRAVENOUS | Status: AC
  Administered 2018-03-25: 1000 mL via INTRAVENOUS

## 2018-03-25 MED ORDER — SERTRALINE HCL 50 MG PO TABS: 50.0000 mg | ORAL_TABLET | Freq: Every day | ORAL | Status: DC

## 2018-03-25 MED ORDER — MIRTAZAPINE 15 MG PO TABS: 15.0000 mg | ORAL_TABLET | Freq: Every day | ORAL | Status: DC

## 2018-03-25 MED ORDER — TAMSULOSIN HCL 0.4 MG PO CAPS: 0.4000 mg | ORAL_CAPSULE | Freq: Every day | ORAL | Status: DC

## 2018-03-25 MED ORDER — INSULIN ASPART 100 UNIT/ML ~~LOC~~ SOLN: 0.0000 [IU] | Freq: Every day | SUBCUTANEOUS | Status: DC

## 2018-03-25 MED ORDER — SODIUM CHLORIDE 0.9 % IV BOLUS (SEPSIS)
500.0000 mL | Freq: Once | INTRAVENOUS | Status: AC
  Administered 2018-03-25: 500 mL via INTRAVENOUS

## 2018-03-25 MED ORDER — TRAZODONE HCL 50 MG PO TABS: 50.0000 mg | ORAL_TABLET | Freq: Every day | ORAL | Status: DC

## 2018-03-25 MED ORDER — ACETAMINOPHEN 650 MG RE SUPP: 650.0000 mg | Freq: Four times a day (QID) | RECTAL | Status: DC | PRN

## 2018-03-25 MED ORDER — ACETAMINOPHEN 325 MG PO TABS: 650.0000 mg | ORAL_TABLET | Freq: Four times a day (QID) | ORAL | Status: DC | PRN

## 2018-03-25 MED ORDER — SODIUM CHLORIDE 0.9 % IV SOLN
1.0000 g | INTRAVENOUS | Status: DC
  Administered 2018-03-26: 06:00:00 1 g via INTRAVENOUS
  Filled 2018-03-25: qty 1
  Filled 2018-03-25 (×2): qty 10

## 2018-03-25 MED ORDER — INSULIN ASPART 100 UNIT/ML ~~LOC~~ SOLN: 0.0000 [IU] | Freq: Three times a day (TID) | SUBCUTANEOUS | Status: DC

## 2018-03-25 MED ORDER — ONDANSETRON HCL 4 MG PO TABS: 4.0000 mg | ORAL_TABLET | Freq: Four times a day (QID) | ORAL | Status: DC | PRN

## 2018-03-25 MED ORDER — HEPARIN SODIUM (PORCINE) 5000 UNIT/ML IJ SOLN
5000.0000 [IU] | Freq: Three times a day (TID) | INTRAMUSCULAR | Status: DC
  Administered 2018-03-25 – 2018-03-26 (×2): 5000 [IU] via SUBCUTANEOUS
  Filled 2018-03-25 (×4): qty 1

## 2018-03-25 MED ORDER — GABAPENTIN 100 MG PO CAPS: 100.0000 mg | ORAL_CAPSULE | Freq: Every day | ORAL | Status: DC

## 2018-03-25 MED ORDER — ONDANSETRON HCL 4 MG/2ML IJ SOLN: 4.0000 mg | Freq: Four times a day (QID) | INTRAMUSCULAR | Status: DC | PRN

## 2018-03-25 MED ORDER — LORATADINE 10 MG PO TABS: 10.0000 mg | ORAL_TABLET | Freq: Every day | ORAL | Status: DC

## 2018-03-25 MED ORDER — SODIUM CHLORIDE 0.9 % IV SOLN
2.0000 g | Freq: Once | INTRAVENOUS | Status: AC
  Administered 2018-03-25: 2 g via INTRAVENOUS
  Filled 2018-03-25: qty 2

## 2018-03-25 MED ORDER — LEVOTHYROXINE SODIUM 88 MCG PO TABS
88.0000 ug | ORAL_TABLET | Freq: Every day | ORAL | Status: DC
  Filled 2018-03-25 (×2): qty 1

## 2018-03-25 MED ORDER — SODIUM CHLORIDE 0.9 % IV SOLN
INTRAVENOUS | Status: DC
  Administered 2018-03-25 – 2018-03-26 (×2): via INTRAVENOUS

## 2018-03-25 NOTE — ED Notes (Signed)
Attempted IV access x 2 both unsuccessful, Brandy RN to try, EMS IV occluded unable to flush

## 2018-03-25 NOTE — Progress Notes (Signed)
CODE SEPSIS - PHARMACY COMMUNICATION  **Broad Spectrum Antibiotics should be administered within 1 hour of Sepsis diagnosis**  Time Code Sepsis Called/Page Received: 11/16 @1600    Antibiotics Ordered: cefepime 2 g x 1   Time of 1st antibiotic administration: 11/16 @1653   Additional action taken by pharmacy: Called RN to notify there was 15 minuntes left to administer abx to meet code sepsis time.   If necessary, Name of Provider/Nurse Contacted: Devan    Ronnald RampKishan S Venezia Sargeant ,PharmD Clinical Pharmacist  03/25/2018  4:02 PM

## 2018-03-25 NOTE — H&P (Addendum)
[facility] - [place] at [facility]    PATIENT NAME: [name]    MR#:  [id number]  DATE OF BIRTH:  30-Jul-1927  DATE OF ADMISSION:  03/25/2018  PRIMARY CARE PHYSICIAN: Clemetine Marker, FNP   REQUESTING/REFERRING PHYSICIAN: Dr. Sharman Cheek  CHIEF COMPLAINT:   Chief Complaint  Patient presents with  . Weakness    HISTORY OF PRESENT ILLNESS:  [name]  is a 84 y.o. female with a known history of Alzheimer's dementia, diabetes, hypertension, hypothyroidism, hearing impairment who presents to the hospital from assisted living secondary to weakness, poor p.o. intake.  Patient herself is a very poor historian therefore most of the history obtained from the ER physician in the chart and speaking to the nurse.  Patient apparently resides at an assisted living and usually is somewhat ambulatory but over the past few days she has been feeling more weak, has had no appetite and has not been eating anything and is not in her usual state and therefore sent to the ER for further evaluation.  In the emergency room patient was noted to be slightly hypotensive, urinalysis was positive for UTI and she was noted to be in acute kidney injury.  Hospitalist services were contacted for further treatment evaluation.  PAST MEDICAL HISTORY:   Past Medical History:  Diagnosis Date  . Alzheimer's dementia (HCC)   . Cellulitis of left lower extremity   . Diabetes type 2, controlled (HCC)   . Hearing impairment   . Hypertension   . Hypothyroidism   . Unsteady gait   . Vision impairment     PAST SURGICAL HISTORY:   Past Surgical History:  Procedure Laterality Date  . HIP ARTHROPLASTY Right 01/08/2015   Procedure: ARTHROPLASTY BIPOLAR HIP (HEMIARTHROPLASTY);  Surgeon: [name], MD;  Location: [facility] ORS;  Service: Orthopedics;  Laterality: Right;  . IRRIGATION AND DEBRIDEMENT FOOT Left 04/12/2015   Procedure: IRRIGATION AND DEBRIDEMENT FOOT;  Surgeon: Linus Galas, MD;  Location: [facility] ORS;  Service: Podiatry;  Laterality: Left;  . NO PAST SURGERIES    . PERIPHERAL VASCULAR CATHETERIZATION Left 07/07/2015   Procedure: Lower Extremity Angiography;  Surgeon: Annice Needy, MD;  Location: [facility] INVASIVE CV LAB;  Service: Cardiovascular;  Laterality: Left;    SOCIAL HISTORY:   Social History   Tobacco Use  . Smoking status: Never Smoker  . Smokeless tobacco: Never Used  Substance Use Topics  . Alcohol use: No    FAMILY HISTORY:   Family History  Family history unknown: Yes    DRUG ALLERGIES:  No Known Allergies  REVIEW OF SYSTEMS:   Review of Systems  Unable to perform ROS: Dementia    MEDICATIONS AT HOME:   Prior to Admission medications   Medication Sig Start Date End Date Taking? Authorizing Provider  acetaminophen (TYLENOL) 500 MG tablet Take 500 mg by mouth at bedtime.     [provider]  dicyclomine (BENTYL) 10 MG capsule Take 10 mg by mouth 3 (three) times daily before meals.     [provider]  gabapentin (NEURONTIN) 100 MG capsule Take 100 mg by mouth daily.     [provider]  levothyroxine (SYNTHROID, LEVOTHROID) 88 MCG tablet Take 88 mcg by mouth daily.     [provider]  lisinopril (PRINIVIL,ZESTRIL) 5 MG tablet Take 5 mg by mouth daily.    [provider]  loperamide (IMODIUM) 2 MG capsule See admin instructions. Take 2 capsules (4MG ) by mouth after first loose  stool and 1 capsule (2MG ) by mouth after each subsequent loose stool - max 16MG  daily    [provider]  loratadine (CLARITIN) 10 MG tablet Take 10 mg by mouth daily.    [provider]  metFORMIN (GLUCOPHAGE) 500 MG tablet Take 500 mg by mouth 2 (two) times daily with a meal.     [provider]  mirtazapine (REMERON) 15 MG tablet Take 15 mg by mouth at bedtime.    [provider]  sertraline (ZOLOFT) 50 MG tablet Take 75 mg by mouth daily.     [provider]    tamsulosin (FLOMAX) 0.4 MG CAPS capsule Take 1 capsule (0.4 mg total) by mouth daily. 02/03/15   Michiel Cowboy A, PA-C  traMADol (ULTRAM) 50 MG tablet Take 1 tablet (50 mg total) by mouth every 6 (six) hours as needed. 01/30/18   Houston Siren, MD  traZODone (DESYREL) 50 MG tablet Take 50 mg by mouth at bedtime.    [provider]      VITAL SIGNS:  Blood pressure 124/73, pulse 91, temperature 98 F (36.7 C), temperature source Oral, resp. rate 11, height 4\' 11"  (1.499 m), weight 46.7 kg, SpO2 98 %.  PHYSICAL EXAMINATION:  Physical Exam  GENERAL:  84 y.o.-year-old thin, Cachectic patient lying in bed in no acute distress.  EYES: Pupils equal, round, reactive to light and accommodation. No scleral icterus. Extraocular muscles intact.  HEENT: Head atraumatic, normocephalic. Oropharynx and nasopharynx clear. No oropharyngeal erythema, dry oral mucosa  NECK:  Supple, no jugular venous distention. No thyroid enlargement, no tenderness.  LUNGS: Normal breath sounds bilaterally, no wheezing, rales, rhonchi. No use of accessory muscles of respiration.  CARDIOVASCULAR: S1, S2 RRR. No murmurs, rubs, gallops, clicks.  ABDOMEN: Soft, nontender, nondistended. Bowel sounds present. No organomegaly or mass.  EXTREMITIES: No pedal edema, cyanosis, or clubbing. + 2 pedal & radial pulses b/l.   NEUROLOGIC: Cranial nerves II through XII are intact. No focal Motor or sensory deficits appreciated b/l. Globally weak.  PSYCHIATRIC: The patient is alert and oriented x 1. Flat affect.  SKIN: No obvious rash, lesion, or ulcer.   LABORATORY PANEL:   CBC Recent Labs  Lab 03/25/18 1604  WBC 7.3  HGB 11.7*  HCT 35.5*  PLT 316   ------------------------------------------------------------------------------------------------------------------  Chemistries  Recent Labs  Lab 03/25/18 1604  NA 133*  K 5.5*  CL 96*  CO2 23  GLUCOSE 198*  BUN 108*  CREATININE 1.88*  CALCIUM 9.1  AST 25   ALT 11  ALKPHOS 58  BILITOT 0.7   ------------------------------------------------------------------------------------------------------------------  Cardiac Enzymes Recent Labs  Lab 03/25/18 1604  TROPONINI 0.12*   ------------------------------------------------------------------------------------------------------------------  RADIOLOGY:  Dg Chest Port 1 View  Result Date: 03/25/2018 CLINICAL DATA:  Weakness EXAM: PORTABLE CHEST 1 VIEW COMPARISON:  01/30/2018 FINDINGS: Lungs are clear.  No pleural effusion or pneumothorax. The heart is normal in size. IMPRESSION: No evidence of acute cardiopulmonary disease. Electronically Signed   By: Charline Bills M.D.   On: 03/25/2018 16:24     IMPRESSION AND PLAN:   84 year old female with past medical history of Alzheimer's dementia, diabetes, hypertension, hypothyroidism, failure to thrive who presents to the hospital due to generalized weakness, altered mental status and poor p.o. intake.   1.  Sepsis-patient meets criteria given her hypotension, elevated lactic acid and abnormal urinalysis consistent with a UTI. - We will treat the patient with IV ceftriaxone, give her IV fluids, follow hemodynamics, follow urine cultures and  blood cultures.  2.  Urinary tract infection-source of patient's sepsis.  Treat patient with IV ceftriaxone, follow urine cultures. - Patient had a E. coli UTI about 2 months ago which was pansensitive.  3. Elevated Trop - due to supply demand ischemia from ARF.  - will observe on tele, cycle markers for now.   4.  Acute kidney injury-secondary to failure to thrive and dehydration given her advanced dementia. - Hold lisinopril, hydrate her with IV fluids, follow BUN and creatinine urine output.  5.  Generalized weakness/lethargy-secondary to the UTI with underlying dehydration.  Will hydrate the patient IV fluids, continue IV antibiotics, await physical therapy evaluation. - We will get palliative care  consult to discuss goals of care given her advanced dementia and failure to thrive.  6.  Diabetes-hold metformin, placed on sliding scale insulin for now given the acute kidney injury.  7.  Diabetic neuropathy-continue gabapentin.  8.  Hypothyroidism-continue Synthroid.  9.  Depression-continue Zoloft.    All the records are reviewed and case discussed with ED provider. Management plans discussed with the patient, family and they are in agreement.  CODE STATUS: Full code  TOTAL TIME TAKING CARE OF THIS PATIENT: 40 minutes.    Houston SirenSAINANI,Charlie Seda J M.D on 03/25/2018 at 6:00 PM  Between 7am to 6pm - Pager - [phone]  After 6pm go to [email or web address] - password [email or web address] [facility]  [facility] [place] [facility]  Office  [phone]  CC: Primary care physician; Clemetine Markerampbell, Margaret P, FNP

## 2018-03-25 NOTE — ED Triage Notes (Signed)
[facility], pt recently returned from [facility] after hip fracture and requiring rehab.  Per EMS staff reported that pt is not at her baseline prior to going to [facility] and is currently not as verbal as she previously was.  Per EMS pt has foul urine odor.  Pt has hx of dementia.  Pt received <100 CC of fluid with EMS.

## 2018-03-25 NOTE — ED Notes (Signed)
Brandy RN attempted additional IV access x 1 unsuccessful.

## 2018-03-25 NOTE — ED Notes (Addendum)
Per Dr. Scotty CourtStafford ok to start antibiotics without getting second set of blood cultures.  Have made several attempts by several nurses to get second set of blood cultures, unsuccessful for all attempts. The primary RN Devan made 1 attempt for IV access that was unsuccessful. Pt has had 4 unsuccessful sticks for second blood culture since arrival to ED.

## 2018-03-25 NOTE — ED Provider Notes (Signed)
[facility] Emergency Department Provider Note  ____________________________________________  Time seen: Approximately 4:46 PM  I have reviewed the triage vital signs and the nursing notes.   HISTORY  Chief Complaint Weakness  Level 5 Caveat: Portions of the History and Physical including HPI and review of systems are unable to be completely obtained due to patient being a poor historian due to chronic dementia and acute illness   HPI [name] is a 84 y.o. female with a history of Alzheimer's dementia, diabetes hypertension who was sent to the ED today due to decreased responsiveness and energy first noticed today.  Patient is in assisted living.  Recently had surgery for a hip fracture.  Noted today to have foul-smelling urine and minimally verbal.    Past Medical History:  Diagnosis Date  . Alzheimer's dementia (HCC)   . Cellulitis of left lower extremity   . Diabetes type 2, controlled (HCC)   . Hearing impairment   . Hypertension   . Hypothyroidism   . Unsteady gait   . Vision impairment      Patient Active Problem List   Diagnosis Date Noted  . Protein-calorie malnutrition, severe 01/30/2018  . Hypothyroidism 01/27/2018  . HTN (hypertension) 01/27/2018  . Diabetes (HCC) 01/27/2018  . Pressure ulcer 04/12/2015  . Gangrene (HCC) 04/11/2015  . Urinary retention 02/04/2015  . Atelectasis 01/10/2015  . Acute encephalopathy 01/10/2015  . Dementia (HCC) 01/10/2015  . Closed right hip fracture (HCC) 01/07/2015     Past Surgical History:  Procedure Laterality Date  . HIP ARTHROPLASTY Right 01/08/2015   Procedure: ARTHROPLASTY BIPOLAR HIP (HEMIARTHROPLASTY);  Surgeon: [name], MD;  Location: [facility] ORS;  Service: Orthopedics;  Laterality: Right;  . IRRIGATION AND DEBRIDEMENT FOOT Left 04/12/2015   Procedure: IRRIGATION AND DEBRIDEMENT FOOT;  Surgeon: Linus Galas, MD;  Location: [facility] ORS;  Service: Podiatry;  Laterality: Left;  . NO  PAST SURGERIES    . PERIPHERAL VASCULAR CATHETERIZATION Left 07/07/2015   Procedure: Lower Extremity Angiography;  Surgeon: Annice Needy, MD;  Location: [facility] INVASIVE CV LAB;  Service: Cardiovascular;  Laterality: Left;     Prior to Admission medications   Medication Sig Start Date End Date Taking? Authorizing Provider  acetaminophen (TYLENOL) 500 MG tablet Take 500 mg by mouth at bedtime.     [provider]  dicyclomine (BENTYL) 10 MG capsule Take 10 mg by mouth 3 (three) times daily before meals.     [provider]  gabapentin (NEURONTIN) 100 MG capsule Take 100 mg by mouth daily.     [provider]  levothyroxine (SYNTHROID, LEVOTHROID) 88 MCG tablet Take 88 mcg by mouth daily.     [provider]  lisinopril (PRINIVIL,ZESTRIL) 5 MG tablet Take 5 mg by mouth daily.    [provider]  loperamide (IMODIUM) 2 MG capsule See admin instructions. Take 2 capsules (4MG ) by mouth after first loose stool and 1 capsule (2MG ) by mouth after each subsequent loose stool - max 16MG  daily    [provider]  loratadine (CLARITIN) 10 MG tablet Take 10 mg by mouth daily.    [provider]  metFORMIN (GLUCOPHAGE) 500 MG tablet Take 500 mg by mouth 2 (two) times daily with a meal.     [provider]  mirtazapine (REMERON) 15 MG tablet Take 15 mg by mouth at bedtime.    [provider]  sertraline (ZOLOFT) 50 MG tablet Take 75 mg by mouth daily.     [provider]  tamsulosin (FLOMAX) 0.4 MG CAPS capsule Take 1 capsule (0.4 mg total) by mouth daily. 02/03/15   Michiel Cowboy A, PA-C  traMADol (ULTRAM) 50 MG tablet Take 1 tablet (50 mg total) by mouth every 6 (six) hours as needed. 01/30/18   Houston Siren, MD  traZODone (DESYREL) 50 MG tablet Take 50 mg by mouth at bedtime.    [provider]     Allergies Patient has no known allergies.   Family History  Family history unknown: Yes    Social  History Social History   Tobacco Use  . Smoking status: Never Smoker  . Smokeless tobacco: Never Used  Substance Use Topics  . Alcohol use: No  . Drug use: No    Review of Systems  Constitutional:   No fever or chills.  ENT:   No sore throat. No rhinorrhea. Cardiovascular:   No chest pain or syncope. Respiratory:   No dyspnea or cough. Gastrointestinal:   Negative for abdominal pain, vomiting and diarrhea.  Musculoskeletal:   Negative for focal pain or swelling All other systems reviewed and are negative except as documented above in ROS and HPI.  ____________________________________________   PHYSICAL EXAM:  VITAL SIGNS: ED Triage Vitals  Enc Vitals Group     BP 03/25/18 1556 (!) 93/58     Pulse Rate 03/25/18 1556 91     Resp 03/25/18 1556 16     Temp 03/25/18 1556 98 F (36.7 C)     Temp Source 03/25/18 1556 Oral     SpO2 03/25/18 1556 98 %     Weight 03/25/18 1557 102 lb 15.3 oz (46.7 kg)     Height 03/25/18 1557 4\' 11"  (1.499 m)     Head Circumference --      [facility] Flow --      Pain Score --      Pain Loc --      Pain Edu? --      Excl. in GC? --     Vital signs reviewed, nursing assessments reviewed.   Constitutional:   Alert and oriented to self.  Ill-appearing  eyes:   Conjunctivae are normal. EOMI. PERRL. ENT      Head:   Normocephalic and atraumatic.      Nose:   No congestion/rhinnorhea.       Mouth/Throat:   Dry mucous membranes, no pharyngeal erythema. No peritonsillar mass.       Neck:   No meningismus. Full ROM. Hematological/Lymphatic/Immunilogical:   No cervical lymphadenopathy. Cardiovascular:   RRR, heart rate 95. Symmetric bilateral radial and DP pulses.  No murmurs. Cap refill less than 2 seconds. Respiratory:   Normal respiratory effort without tachypnea/retractions. Breath sounds are clear and equal bilaterally. No wheezes/rales/rhonchi. Gastrointestinal:   Soft and nontender. Non distended. There is no CVA tenderness.  No rebound,  rigidity, or guarding. Musculoskeletal:   Normal range of motion in all extremities. No joint effusions.  No lower extremity tenderness.  No edema. Neurologic:   Normal speech and language.  Motor grossly intact. No acute focal neurologic deficits are appreciated.  Skin:    Skin is warm, dry and intact. No rash noted.  No petechiae, purpura, or bullae.  ____________________________________________    LABS (pertinent positives/negatives) (all labs ordered are listed, but only abnormal results are displayed) Labs Reviewed  COMPREHENSIVE METABOLIC PANEL - Abnormal; Notable for the following components:      Result Value   Sodium 133 (*)    Potassium  5.5 (*)    Chloride 96 (*)    Glucose, Bld 198 (*)    BUN 108 (*)    Creatinine, Ser 1.88 (*)    GFR calc non Af Amer 23 (*)    GFR calc Af Amer 26 (*)    All other components within normal limits  TROPONIN I - Abnormal; Notable for the following components:   Troponin I 0.12 (*)    All other components within normal limits  CBC WITH DIFFERENTIAL/PLATELET - Abnormal; Notable for the following components:   Hemoglobin 11.7 (*)    HCT 35.5 (*)    All other components within normal limits  URINALYSIS, COMPLETE (UACMP) WITH MICROSCOPIC - Abnormal; Notable for the following components:   Color, Urine YELLOW (*)    APPearance TURBID (*)    Hgb urine dipstick SMALL (*)    Protein, ur 100 (*)    RBC / HPF >50 (*)    WBC, UA >50 (*)    Bacteria, UA MANY (*)    All other components within normal limits  T4, FREE - Abnormal; Notable for the following components:   Free T4 0.78 (*)    All other components within normal limits  TSH - Abnormal; Notable for the following components:   TSH 14.594 (*)    All other components within normal limits  CG4 I-STAT (LACTIC ACID) - Abnormal; Notable for the following components:   Lactic Acid, Venous 2.14 (*)    All other components within normal limits  CULTURE, BLOOD (ROUTINE X 2)  URINE CULTURE   LIPASE, BLOOD  PROCALCITONIN  I-STAT CG4 LACTIC ACID, ED  I-STAT CG4 LACTIC ACID, ED   ____________________________________________   EKG  Interpreted by me Sinus rhythm rate of 90, normal axis and intervals.  Normal QRS ST segments and T waves.  No acute ischemic changes. ____________________________________________    RADIOLOGY  Dg Chest Port 1 View  Result Date: 03/25/2018 CLINICAL DATA:  Weakness EXAM: PORTABLE CHEST 1 VIEW COMPARISON:  01/30/2018 FINDINGS: Lungs are clear.  No pleural effusion or pneumothorax. The heart is normal in size. IMPRESSION: No evidence of acute cardiopulmonary disease. Electronically Signed   By: Charline Bills M.D.   On: 03/25/2018 16:24    ____________________________________________   PROCEDURES .Critical Care Performed by: Sharman Cheek, MD Authorized by: Sharman Cheek, MD   Critical care provider statement:    Critical care time (minutes):  35   Critical care time was exclusive of:  Separately billable procedures and treating other patients   Critical care was necessary to treat or prevent imminent or life-threatening deterioration of the following conditions:  Sepsis   Critical care was time spent personally by me on the following activities:  Development of treatment plan with patient or surrogate, discussions with consultants, evaluation of patient's response to treatment, examination of patient, obtaining history from patient or surrogate, ordering and performing treatments and interventions, ordering and review of laboratory studies, ordering and review of radiographic studies, pulse oximetry, re-evaluation of patient's condition and review of old charts    ____________________________________________  DIFFERENTIAL DIAGNOSIS   UTI, pneumonia, dehydration  CLINICAL IMPRESSION / ASSESSMENT AND PLAN / ED COURSE  Pertinent labs & imaging results that were available during my care of the patient were reviewed by me and  considered in my medical decision making (see chart for details).      Clinical Course as of Mar 25 1750  Sat Mar 25, 2018  1600 P/w hypotension, tachycardia to 90s, suspected  UTI.  Code ssepsis intiated. Cefepime, IVF bolus.    [PS]  1627 Cont. IVF.   Lactic Acid, Venous(!!): 2.14 [PS]  1645 Nursing reports that they are unable to obtain a second set of blood cultures despite multiple additional needle sticks.  I have asked that they go ahead and give antibiotics without further delay.  One set of cultures is obtained.   [PS]  1724 UA shows a clear UTI confirming source.  Blood pressure is normalized after IV fluids.  Sepsis reassessment completed. Will admit  Urinalysis, Complete w Microscopic(!) [PS]    Clinical Course User Index [PS] Sharman CheekStafford, Nakeshia Waldeck, MD     ----------------------------------------- 5:51 PM on 03/25/2018 -----------------------------------------  AKI with creatinine of 1.88 compared to baseline of 0.81.  ____________________________________________   FINAL CLINICAL IMPRESSION(S) / ED DIAGNOSES    Final diagnoses:  Cystitis  Sepsis, due to unspecified organism, unspecified whether acute organ dysfunction present [facility](HCC)  Acute kidney injury   ED Discharge Orders    None      Portions of this note were generated with dragon dictation software. Dictation errors may occur despite best attempts at proofreading.    Sharman CheekStafford, Miachel Nardelli, MD 03/25/18 [id number]

## 2018-03-25 NOTE — ED Notes (Signed)
Date and time results received: 03/25/18 4:59 PM   Test: Troponin Critical Value: 0.12 ng/mL  Name of Provider Notified: Dr. Scotty CourtStafford

## 2018-03-26 DIAGNOSIS — L8990 Pressure ulcer of unspecified site, unspecified stage: Secondary | ICD-10-CM

## 2018-03-26 LAB — BASIC METABOLIC PANEL
Anion gap: 8 (ref 5–15)
BUN: 96 mg/dL — ABNORMAL HIGH (ref 8–23)
CHLORIDE: 109 mmol/L (ref 98–111)
CO2: 21 mmol/L — ABNORMAL LOW (ref 22–32)
Calcium: 8.1 mg/dL — ABNORMAL LOW (ref 8.9–10.3)
Creatinine, Ser: 1.35 mg/dL — ABNORMAL HIGH (ref 0.44–1.00)
GFR, EST AFRICAN AMERICAN: 39 mL/min — AB (ref >60)
GFR, EST NON AFRICAN AMERICAN: 34 mL/min — AB (ref >60)
Glucose, Bld: 84 mg/dL (ref 70–99)
POTASSIUM: 4.3 mmol/L (ref 3.5–5.1)
SODIUM: 138 mmol/L (ref 135–145)

## 2018-03-26 LAB — GLUCOSE, CAPILLARY
GLUCOSE-CAPILLARY: 80 mg/dL (ref 70–99)
GLUCOSE-CAPILLARY: 100 mg/dL — AB (ref 70–99)
Glucose-Capillary: 85 mg/dL (ref 70–99)
Glucose-Capillary: 90 mg/dL (ref 70–99)
Glucose-Capillary: 75 mg/dL (ref 70–99)

## 2018-03-26 LAB — TROPONIN I: TROPONIN I: 0.13 ng/mL — AB (ref <0.03)

## 2018-03-26 MED ORDER — ENSURE ENLIVE PO LIQD: 237.0000 mL | Freq: Three times a day (TID) | ORAL | Status: DC

## 2018-03-26 MED ORDER — MEGESTROL ACETATE 400 MG/10ML PO SUSP
400.0000 mg | Freq: Two times a day (BID) | ORAL | Status: DC
  Filled 2018-03-26 (×4): qty 10

## 2018-03-26 MED ORDER — ADULT MULTIVITAMIN W/MINERALS CH: 1.0000 | ORAL_TABLET | Freq: Every day | ORAL | Status: DC

## 2018-03-26 MED ORDER — SERTRALINE HCL 50 MG PO TABS: 25.0000 mg | ORAL_TABLET | Freq: Every day | ORAL | Status: DC

## 2018-03-26 MED ORDER — SODIUM CHLORIDE 0.9 % IV BOLUS
1000.0000 mL | Freq: Once | INTRAVENOUS | Status: AC
  Administered 2018-03-26: 05:00:00 1000 mL via INTRAVENOUS

## 2018-03-26 NOTE — Care Management (Signed)
RNCM team received consult for placement. Patient is from [facility] assisted living. Per the chart patient just finished rehab at [facility] prior to discharging back to [facility]. Patient currently confused and family is unavailable to assist in assessment. Patient is positive for UTI and is receiving V abx. Orders in place for PT consult. Palliative care has also been consulted since patient has been generally deconditioning and considered failure to thrive. RNCM team will continue to follow to assist with transition of care.

## 2018-03-26 NOTE — Clinical Social Work Note (Signed)
The CSW is aware via chart review that the patient admitted from [facility]. The CSW will assess when able.  Argentina PonderKaren Martha Anglea Gordner, MSW, Theresia MajorsLCSWA [phone]

## 2018-03-26 NOTE — Progress Notes (Signed)
Physical Therapy Evaluation Patient Details Name: [name] MRN: [id number] DOB: Nov 11, 1927 Today's Date: 03/26/2018   History of Present Illness  [name]  is a 84 y.o. female with a known history of Alzheimer's dementia, diabetes, hypertension, hypothyroidism, hearing impairment who presents to the hospital from assisted living secondary to weakness, poor p.o. intake.  Patient is somewhat ambulatory but over the past few days she has been feeling more weak, has had no appetite and has not been eating anything and is not in her usual state and therefore sent to the ER for further evaluation.  In the emergency room patient was noted to be slightly hypotensive, urinalysis was positive for UTI and she was noted to be in acute kidney injury.  Clinical Impression  Patient is lethargic and keeps her eyes closed for most of the evaluation. She performs bed mobility with max assist for rolling and supine to sit. She has fair sitting balance with UE support. She is able to sit at the EOB for 5 mins but refuses to stand or ambulate. She is limited by her weakness and will benefit from skilled PT to improve her mobility and strength.     Follow Up Recommendations SNF    Equipment Recommendations  Rolling walker with 5" wheels    Recommendations for Other Services       Precautions / Restrictions Precautions Precautions: Fall Restrictions Weight Bearing Restrictions: No      Mobility  Bed Mobility Overal bed mobility: Needs Assistance             General bed mobility comments: max asssit for supine to sit EOB   Transfers Overall transfer level: (Patient refused to stand or transfer)                  Ambulation/Gait Ambulation/Gait assistance: (Patient refused to stand or ambulate)              Stairs            Wheelchair Mobility    Modified Rankin (Stroke Patients Only)       Balance Overall balance assessment: Needs  assistance Sitting-balance support: Bilateral upper extremity supported Sitting balance-Leahy Scale: Fair     Standing balance support: (Not tested due to patient refusing to stand)                                 Pertinent Vitals/Pain Pain Assessment: Faces Pain Score: 0-No pain    Home Living Family/patient expects to be discharged to:: Skilled nursing facility                      Prior Function Level of Independence: (unknown)               Hand Dominance   Dominant Hand: (unknown)    Extremity/Trunk Assessment   Upper Extremity Assessment Upper Extremity Assessment: Overall WFL for tasks assessed    Lower Extremity Assessment Lower Extremity Assessment: Generalized weakness       Communication   Communication: Other (comment)(Patient speaks very little)  Cognition Arousal/Alertness: Lethargic                                            General Comments  Therapeutic activities: Patient performs seated position for 5 mins with vc for posture  correction and better LE support on the floor. Patient needs CGA for sitting EOB with UE support.      Exercises     Assessment/Plan    PT Assessment Patient needs continued PT services  PT Problem List Decreased strength;Decreased activity tolerance;Decreased balance;Decreased mobility       PT Treatment Interventions Functional mobility training;Therapeutic activities;Therapeutic exercise;Balance training    PT Goals (Current goals can be found in the Care Plan section)  Acute Rehab PT Goals Patient Stated Goal: (no goals stated) PT Goal Formulation: Patient unable to participate in goal setting Time For Goal Achievement: 04/09/18 Potential to Achieve Goals: Fair    Frequency Min 2X/week   Barriers to discharge (unknown)      Co-evaluation               AM-PAC PT "6 Clicks" Daily Activity  Outcome Measure Difficulty turning over in bed (including  adjusting bedclothes, sheets and blankets)?: A Lot Difficulty moving from lying on back to sitting on the side of the bed? : A Lot Difficulty sitting down on and standing up from a chair with arms (e.g., wheelchair, bedside commode, etc,.)?: A Lot Help needed moving to and from a bed to chair (including a wheelchair)?: Total Help needed walking in hospital room?: Total Help needed climbing 3-5 steps with a railing? : Total 6 Click Score: 9    End of Session Equipment Utilized During Treatment: Gait belt Activity Tolerance: Patient limited by fatigue;Patient limited by lethargy     PT Visit Diagnosis: Muscle weakness (generalized) (M62.81)    Time: 1600-1620 PT Time Calculation (min) (ACUTE ONLY): 20 min   Charges:   PT Evaluation $PT Eval Low Complexity: 1 Low PT Treatments $Therapeutic Activity: 8-22 mins          Ezekiel InaMansfield, Axil Copeman S, PT DPT 03/26/2018, 4:32 PM

## 2018-03-26 NOTE — [place] FL2 (Addendum)
[place] MEDICAID FL2 LEVEL OF CARE SCREENING TOOL     IDENTIFICATION  Patient Name: [name] Birthdate: 06-16-1927 Sex: female Admission Date (Current Location): 03/25/2018  [place] and [place] Number:  [address] [id number] [facility] Facility and Address:  [facility], [address], [place], [place] [address]      Provider Number: [id number]  Attending Physician Name and Address:  Bertrum Sol, MD  Relative Name and Phone Number:  Vilma Will (Daughter) [phone] or Alvis Lemmings [phone]    Current Level of Care: Hospital Recommended Level of Care: Assisted Living Facility  Memory Care  Prior Approval Number:    Date Approved/Denied:   PASRR Number: [id number]  Discharge Plan: Domiciliary (Rest home)    Current Diagnoses: Primary Diagnosis: Dementia  Patient Active Problem List   Diagnosis Date Noted  . Pressure injury of skin 03/26/2018  . Acute renal failure (ARF) (HCC) 03/25/2018  . Protein-calorie malnutrition, severe 01/30/2018  . Hypothyroidism 01/27/2018  . HTN (hypertension) 01/27/2018  . Diabetes (HCC) 01/27/2018  . Pressure ulcer 04/12/2015  . Gangrene (HCC) 04/11/2015  . Urinary retention 02/04/2015  . Atelectasis 01/10/2015  . Acute encephalopathy 01/10/2015  . Dementia (HCC) 01/10/2015  . Closed right hip fracture (HCC) 01/07/2015    Orientation RESPIRATION BLADDER Height & Weight     Self  Normal Incontinent Weight: 102 lb 15.3 oz (46.7 kg) Height:  4\' 11"  (149.9 cm)  BEHAVIORAL SYMPTOMS/MOOD NEUROLOGICAL BOWEL NUTRITION STATUS      Incontinent Diet(Dysphagia 3, thin liquids)  AMBULATORY STATUS COMMUNICATION OF NEEDS Skin   Extensive Assist Verbally Normal                       Personal Care Assistance Level of Assistance  Bathing, Feeding, Dressing Bathing Assistance: Maximum assistance Feeding assistance: Limited assistance Dressing Assistance: Maximum assistance     Functional  Limitations Info  Sight, Hearing, Speech Sight Info: Adequate Hearing Info: Impaired Speech Info: Adequate    SPECIAL CARE FACTORS FREQUENCY                       Contractures Contractures Info: Not present    Additional Factors Info  Code Status, Allergies Code Status Info: Full Allergies Info: No Known Allergies           Current Medications (03/26/2018):  This is the current hospital active medication list Current Facility-Administered Medications  Medication Dose Route Frequency Provider Last Rate Last Dose  . 0.9 %  sodium chloride infusion   Intravenous Continuous Salary, Montell D, MD 30 mL/hr at 03/26/18 1405    . acetaminophen (TYLENOL) tablet 650 mg  650 mg Oral Q6H PRN Houston Siren, MD       Or  . acetaminophen (TYLENOL) suppository 650 mg  650 mg Rectal Q6H PRN Houston Siren, MD      . cefTRIAXone (ROCEPHIN) 1 g in sodium chloride 0.9 % 100 mL IVPB  1 g Intravenous Q24H Houston Siren, MD   Stopped at 03/26/18 0615  . feeding supplement (ENSURE ENLIVE) (ENSURE ENLIVE) liquid 237 mL  237 mL Oral TID BM Salary, Montell D, MD      . heparin injection 5,000 Units  5,000 Units Subcutaneous Q8H Houston Siren, MD   5,000 Units at 03/26/18 0544  . insulin aspart (novoLOG) injection 0-5 Units  0-5 Units Subcutaneous QHS Sainani, Vivek J, MD      . insulin aspart (novoLOG) injection 0-9 Units  0-9 Units Subcutaneous TID WC Sainani, Rolly PancakeVivek J, MD      . levothyroxine (SYNTHROID, LEVOTHROID) tablet 88 mcg  88 mcg Oral Daily Sainani, Rolly PancakeVivek J, MD      . loratadine (CLARITIN) tablet 10 mg  10 mg Oral Daily Houston SirenSainani, Vivek J, MD      . megestrol (MEGACE) 400 MG/10ML suspension 400 mg  400 mg Oral BID Salary, Montell D, MD      . mirtazapine (REMERON) tablet 15 mg  15 mg Oral QHS Houston SirenSainani, Vivek J, MD      . Melene Muller[START ON 03/27/2018] multivitamin with minerals tablet 1 tablet  1 tablet Oral Daily Salary, Montell D, MD      . ondansetron (ZOFRAN) tablet 4 mg  4 mg Oral Q6H  PRN Houston SirenSainani, Vivek J, MD       Or  . ondansetron (ZOFRAN) injection 4 mg  4 mg Intravenous Q6H PRN Houston SirenSainani, Vivek J, MD      . Melene Muller[START ON 03/27/2018] sertraline (ZOLOFT) tablet 25 mg  25 mg Oral Daily Salary, Montell D, MD      . tamsulosin (FLOMAX) capsule 0.4 mg  0.4 mg Oral Daily Sainani, Rolly PancakeVivek J, MD      . traZODone (DESYREL) tablet 50 mg  50 mg Oral QHS Houston SirenSainani, Vivek J, MD         Discharge Medications: Please see discharge summary for a list of discharge medications.  Relevant Imaging Results:  Relevant Lab Results:   Additional Information SS#[id number]  Judi CongKaren M White, LCSW

## 2018-03-26 NOTE — Clinical Social Work Note (Signed)
Clinical Social Work Assessment  Patient Details  Name: [name] MRN: [id number] Date of Birth: 09-26-1927  Date of referral:  03/26/18               Reason for consult:  Facility Placement                Permission sought to share information with:  Oceanographeracility Contact Representative Permission granted to share information::  Yes, Verbal Permission Granted  Name::        Agency::  [facility]  Relationship::     Contact Information:     Housing/Transportation Living arrangements for the past 2 months:  Assisted Living Facility Source of Information:  Medical Team, Facility Patient Interpreter Needed:  None Criminal Activity/Legal Involvement Pertinent to Current Situation/Hospitalization:  No - Comment as needed Significant Relationships:  Merchandiser, retailCommunity Support Lives with:  Facility Resident Do you feel safe going back to the place where you live?  Yes Need for family participation in patient care:  Yes (Comment)(Patient has dementia)  Care giving concerns:  [facility]   Social Worker assessment / plan:  The CSW visited the patient at bedside to discuss discharge planning. The patient was sleeping soundly with no family present in the room. The CSW attempted to contact the patient's daughter with no answer and no ability to leave voice mail. The CSW contacted Alvis LemmingsJanice Worth, Production designer, theatre/television/filmadministrator for [facility] who provided information.  The patient has been a resident of [facility] since 2014. Liborio NixonJanice shared that the patient's daughter does not respond to phone calls and has only spoken to facility representatives once in the time that her mother has been a resident. Liborio NixonJanice contacted APS on Friday to initiate possible guardianship in order to begin goals of care considerations as the patient's next of kin seems unreachable. Liborio NixonJanice has said that, regardless of PT recommendations, the patient will return to [facility], hopefully with hospice or palliative assistance. The  CSW advised Liborio NixonJanice that a palliative consult has been placed. The patient currently shows signs of failure to thrive, and she is now on a dysphagia 3 diet with Ensure to boost caloric intake in the setting of malnutrition.  The CSW will continue to follow for discharge facilitation. Employment status:  Retired Health and safety inspectornsurance information:  Armed forces operational officerMedicare, Medicaid [place] PT Recommendations:  Not assessed at this time Information / Referral to community resources:  APS (Comment Required: [place], Name & Number of worker spoken with)  Patient/Family's Response to care:  Liborio NixonJanice thanked the CSW.  Patient/Family's Understanding of and Emotional Response to Diagnosis, Current Treatment, and Prognosis: Liborio NixonJanice understands the patient's needs and is willing to accept the patient back when stable.  Emotional Assessment Appearance:  Appears older than stated age Attitude/Demeanor/Rapport:  Lethargic Affect (typically observed):  Constricted Orientation:  Oriented to Self Alcohol / Substance use:  Never Used Psych involvement (Current and /or in the community):  No (Comment)  Discharge Needs  Concerns to be addressed:  Care Coordination, Discharge Planning Concerns, Decision making concerns Readmission within the last 30 days:  No Current discharge risk:  Chronically ill, Cognitively Impaired Barriers to Discharge:  Continued Medical Work up   UAL CorporationKaren M Evolett [place], LCSW 03/26/2018, 4:34 PM

## 2018-03-26 NOTE — Progress Notes (Signed)
Initial Nutrition Assessment  DOCUMENTATION CODES:   Severe malnutrition in context of social or environmental circumstances  INTERVENTION:  Downgraded diet to dysphagia 3 (mechanical soft) with thin liquids.  Provide Ensure Enlive po TID, each supplement provides 350 kcal and 20 grams of protein.  Provide daily MVI.  NUTRITION DIAGNOSIS:   Severe Malnutrition related to social / environmental circumstances(Alzheimer's dementia, advanced age, inadequate oral intake) as evidenced by severe fat depletion, severe muscle depletion.  GOAL:   Patient will meet greater than or equal to 90% of their needs  MONITOR:   PO intake, Supplement acceptance, Labs, Weight trends, Skin, I & O's  REASON FOR ASSESSMENT:   Consult Assessment of nutrition requirement/status  ASSESSMENT:   84 year old female with PMHx of Alzheimer's dementia, DM type 2, hypothyroidism, vision impairment, hearing impairment, HTN, unsteady gait, cellulitis of left lower extremity who is admitted with sepsis, UTI, AKI, generalized weakness/lethargy.   Met with patient at bedside. Patient is known to this RD from an admission in 01/2018. No family members present. Patient presents from [facility]. She briefly opened her eyes but then closed them again and would not answer any questions. During last admission patient had reported she does not eat much at meals, but was not able to report much more than that in setting of dementia. Unable to assess mouth today but last admission noted poor dentition.  Her UBW was 158 lbs (71.8 kg) per her report last admission. There was not a weight that high in chart. Per chart she was 63.1 kg on 01/07/2015, 53.3 kg on 04/11/2015, 53.5 kg on 07/07/2015. RD had obtained bed scale weight of 103 lbs (46.7 kg) on bed scale 01/29/2018. Current weight is also entered as exactly 46.7 kg so unsure if weight was truly measured. Height on last admission was '5\' 5"'  and this admission is '4\' 11"'  so unsure  which is accurate.  Medications reviewed and include: gabapentin, Novolog 0-9 units TID, Novolog 0-5 units QHS, levothyroxine, Remeron 15 mg QHS, sertraline, Flomax 0.4 mg daily, NS @ 100 mL/hr, ceftriaxone.  Labs reviewed: CBG 85-95, CO2 21, BUN 96, Creatinine 1.35.  NUTRITION - FOCUSED PHYSICAL EXAM:    Most Recent Value  Orbital Region  Severe depletion  Upper Arm Region  Severe depletion  Thoracic and Lumbar Region  Severe depletion  Buccal Region  Severe depletion  Temple Region  Severe depletion  Clavicle Bone Region  Severe depletion  Clavicle and Acromion Bone Region  Severe depletion  Scapular Bone Region  Unable to assess  Dorsal Hand  Severe depletion  Patellar Region  Severe depletion  Anterior Thigh Region  Severe depletion  Posterior Calf Region  Severe depletion  Edema (RD Assessment)  None  Hair  Reviewed  Eyes  Unable to assess  Mouth  Unable to assess  Skin  Reviewed  Nails  Reviewed     Diet Order:   Diet Order            DIET DYS 3 Room service appropriate? Yes; Fluid consistency: Thin  Diet effective now             EDUCATION NEEDS:   Not appropriate for education at this time  Skin:  Skin Assessment: Skin Integrity Issues: Skin Integrity Issues:: Stage I, Unstageable Stage I: right thigh Unstageable: left heel  Last BM:  03/25/2018 - smear type 4  Height:   Ht Readings from Last 1 Encounters:  03/25/18 '4\' 11"'  (1.499 m)   Weight:  Wt Readings from Last 1 Encounters:  03/25/18 46.7 kg   Ideal Body Weight:  44.7 kg  BMI:  Body mass index is 20.79 kg/m.  Estimated Nutritional Needs:   Kcal:  1300-1500  Protein:  70-80 grams  Fluid:  1.4 L/day  Willey Blade, MS, RD, LDN Office: [phone] Pager: [phone] After Hours/Weekend Pager: [phone]

## 2018-03-26 NOTE — Progress Notes (Signed)
[facility]   PATIENT NAME: [name]    MR#:  [id number]  DATE OF BIRTH:  06-May-1928  SUBJECTIVE:  CHIEF COMPLAINT:   Chief Complaint  Patient presents with  . Weakness   Patient is poor historian due to dementia, follow-up on physical therapy/speech therapy, follow-up on cultures REVIEW OF SYSTEMS:  CONSTITUTIONAL: No fever, fatigue or weakness.  EYES: No blurred or double vision.  EARS, NOSE, AND THROAT: No tinnitus or ear pain.  RESPIRATORY: No cough, shortness of breath, wheezing or hemoptysis.  CARDIOVASCULAR: No chest pain, orthopnea, edema.  GASTROINTESTINAL: No nausea, vomiting, diarrhea or abdominal pain.  GENITOURINARY: No dysuria, hematuria.  ENDOCRINE: No polyuria, nocturia,  HEMATOLOGY: No anemia, easy bruising or bleeding SKIN: No rash or lesion. MUSCULOSKELETAL: No joint pain or arthritis.   NEUROLOGIC: No tingling, numbness, weakness.  PSYCHIATRY: No anxiety or depression.   ROS  DRUG ALLERGIES:  No Known Allergies  VITALS:  Blood pressure (!) 98/45, pulse 82, temperature 97.7 F (36.5 C), temperature source Oral, resp. rate 16, height 4\' 11"  (1.499 m), weight 46.7 kg, SpO2 92 %.  PHYSICAL EXAMINATION:  GENERAL:  84 y.o.-year-old patient lying in the bed with no acute distress.  EYES: Pupils equal, round, reactive to light and accommodation. No scleral icterus. Extraocular muscles intact.  HEENT: Head atraumatic, normocephalic. Oropharynx and nasopharynx clear.  NECK:  Supple, no jugular venous distention. No thyroid enlargement, no tenderness.  LUNGS: Normal breath sounds bilaterally, no wheezing, rales,rhonchi or crepitation. No use of accessory muscles of respiration.  CARDIOVASCULAR: S1, S2 normal. No murmurs, rubs, or gallops.  ABDOMEN: Soft, nontender, nondistended. Bowel sounds present. No organomegaly or mass.  EXTREMITIES: No pedal edema, cyanosis, or clubbing.  NEUROLOGIC: Cranial nerves II  through XII are intact. Muscle strength 5/5 in all extremities. Sensation intact. Gait not checked.  PSYCHIATRIC: The patient is alert and oriented x 3.  SKIN: No obvious rash, lesion, or ulcer.   Physical Exam LABORATORY PANEL:   CBC Recent Labs  Lab 03/25/18 1604  WBC 7.3  HGB 11.7*  HCT 35.5*  PLT 316   ------------------------------------------------------------------------------------------------------------------  Chemistries  Recent Labs  Lab 03/25/18 1604 03/26/18 0232  NA 133* 138  K 5.5* 4.3  CL 96* 109  CO2 23 21*  GLUCOSE 198* 84  BUN 108* 96*  CREATININE 1.88* 1.35*  CALCIUM 9.1 8.1*  AST 25  --   ALT 11  --   ALKPHOS 58  --   BILITOT 0.7  --    ------------------------------------------------------------------------------------------------------------------  Cardiac Enzymes Recent Labs  Lab 03/25/18 2221 03/26/18 0232  TROPONINI 0.14* 0.13*   ------------------------------------------------------------------------------------------------------------------  RADIOLOGY:  Dg Chest Port 1 View  Result Date: 03/25/2018 CLINICAL DATA:  Weakness EXAM: PORTABLE CHEST 1 VIEW COMPARISON:  01/30/2018 FINDINGS: Lungs are clear.  No pleural effusion or pneumothorax. The heart is normal in size. IMPRESSION: No evidence of acute cardiopulmonary disease. Electronically Signed   By: Charline Bills M.D.   On: 03/25/2018 16:24    ASSESSMENT AND PLAN:   84 year old female with past medical history of Alzheimer's dementia, diabetes, hypertension, hypothyroidism, failure to thrive who presents to the hospital due to generalized weakness, altered mental status and poor p.o. intake.   *Acute suspected sepsis Resolved Due to ?  UTI Continue sepsis protocol, empiric Rocephin, follow-up on cultures  *Acute ?  UTI Empiric antibiotics per above, follow-up on cultures  *Elevated Trop  due to supply demand ischemia from ARF Conservative therapy  *  Acute kidney  injury Resolved with IV fluids rehydration secondary to failure to thrive and dehydration given her advanced dementia. Continue to hold lisinopril, gentle IV fluids for rehydration, check BMP in the morning   *Generalized weakness/lethargy Suspected due to ? UTI and dehydration, compounded by adult  FTT with dementia Physical therapy to evaluate/treat  Palliative care consulted   *Chronic dementia  Appears at baseline  Increase nursing care PRN, aspiration/fall/skin care precautions while in house   *Chronic diabetes mellitus type 2  Continue to hold metformin  Signs scale insulin Accu-Cheks per routine   *Diabetic neuropathy Hold Neurontin given lethargy  *Hypothyroidism continue Synthroid  *Depression continue Zoloft  Disposition pending clinical course  All the records are reviewed and case discussed with Care Management/Social Workerr. Management plans discussed with the patient, family and they are in agreement.  CODE STATUS: full  TOTAL TIME TAKING CARE OF THIS PATIENT: 35 minutes.     POSSIBLE D/C IN 1-2 DAYS, DEPENDING ON CLINICAL CONDITION.   Evelena AsaMontell D Riyansh Gerstner M.D on 03/26/2018   Between 7am to 6pm - Pager - [phone]  After 6pm go to [email or web address] - password [email or web address]  [facility]  Office  [phone]  CC: Primary care physician; Clemetine Markerampbell, Margaret P, FNP  Note: This dictation was prepared with Dragon dictation along with smaller phrase technology. Any transcriptional errors that result from this process are unintentional.

## 2018-03-27 DIAGNOSIS — A419 Sepsis, unspecified organism: Principal | ICD-10-CM

## 2018-03-27 DIAGNOSIS — N3090 Cystitis, unspecified without hematuria: Secondary | ICD-10-CM

## 2018-03-27 DIAGNOSIS — N179 Acute kidney failure, unspecified: Secondary | ICD-10-CM

## 2018-03-27 DIAGNOSIS — Z7189 Other specified counseling: Secondary | ICD-10-CM

## 2018-03-27 DIAGNOSIS — F0390 Unspecified dementia without behavioral disturbance: Secondary | ICD-10-CM

## 2018-03-27 DIAGNOSIS — Z515 Encounter for palliative care: Secondary | ICD-10-CM

## 2018-03-27 LAB — GLUCOSE, CAPILLARY
GLUCOSE-CAPILLARY: 94 mg/dL (ref 70–99)
Glucose-Capillary: 80 mg/dL (ref 70–99)
Glucose-Capillary: 87 mg/dL (ref 70–99)

## 2018-03-27 MED ORDER — MEGESTROL ACETATE 400 MG/10ML PO SUSP: 400.0000 mg | Freq: Two times a day (BID) | ORAL | 0 refills | Status: DC

## 2018-03-27 MED ORDER — ENSURE ENLIVE PO LIQD: 237.0000 mL | Freq: Three times a day (TID) | ORAL | 12 refills | Status: AC

## 2018-03-27 MED ORDER — CEFUROXIME AXETIL 500 MG PO TABS: 500.0000 mg | ORAL_TABLET | Freq: Two times a day (BID) | ORAL | 0 refills | Status: AC

## 2018-03-27 MED ORDER — ADULT MULTIVITAMIN W/MINERALS CH: 1.0000 | ORAL_TABLET | Freq: Every day | ORAL | Status: DC

## 2018-03-27 NOTE — Evaluation (Signed)
Clinical/Bedside Swallow Evaluation Patient Details  Name: [name] MRN: [id number] Date of Birth: Nov 24, 1927  Today's Date: 03/27/2018 Time: SLP Start Time (ACUTE ONLY): 0930 SLP Stop Time (ACUTE ONLY): 1015 SLP Time Calculation (min) (ACUTE ONLY): 45 min  Past Medical History:  Past Medical History:  Diagnosis Date  . Alzheimer's dementia (HCC)   . Cellulitis of left lower extremity   . Diabetes type 2, controlled (HCC)   . Hearing impairment   . Hypertension   . Hypothyroidism   . Unsteady gait   . Vision impairment    Past Surgical History:  Past Surgical History:  Procedure Laterality Date  . HIP ARTHROPLASTY Right 01/08/2015   Procedure: ARTHROPLASTY BIPOLAR HIP (HEMIARTHROPLASTY);  Surgeon: [name], MD;  Location: [facility] ORS;  Service: Orthopedics;  Laterality: Right;  . IRRIGATION AND DEBRIDEMENT FOOT Left 04/12/2015   Procedure: IRRIGATION AND DEBRIDEMENT FOOT;  Surgeon: Linus Galas, MD;  Location: [facility] ORS;  Service: Podiatry;  Laterality: Left;  . NO PAST SURGERIES    . PERIPHERAL VASCULAR CATHETERIZATION Left 07/07/2015   Procedure: Lower Extremity Angiography;  Surgeon: Annice Needy, MD;  Location: [facility] INVASIVE CV LAB;  Service: Cardiovascular;  Laterality: Left;   HPI:  Per admitting H & P: Tyese Pasqual  is a 84 y.o. female with a known history of Alzheimer's dementia, diabetes, hypertension, hypothyroidism, hearing impairment who presents to the hospital from assisted living secondary to weakness, poor p.o. intake.  Patient herself is a very poor historian therefore most of the history obtained from the ER physician in the chart and speaking to the nurse.  Patient apparently resides at an assisted living and usually is somewhat ambulatory but over the past few days she has been feeling more weak, has had no appetite and has not been eating anything and is not in her usual state and therefore sent to the ER for further evaluation.  In the emergency room  patient was noted to be slightly hypotensive, urinalysis was positive for UTI and she was noted to be in acute kidney injury.  Hospitalist services were contacted for further treatment evaluation.   Assessment / Plan / Recommendation Clinical Impression  Patient presents with mild to mod oral phase dysphagia at bedside. Oral phase c/b mild to mod oral prep/coordination deficits and oral transit delay with thin liquid, puree and solids.  Noted s/s aspiration c/b by immediate coughing with large self fed sips of thin liquid ONLY. Patient also observed to swish thin liquids in mouth before swallow and attempt to tilt head back when given sips by cup. Patient appeared to tolerate smaller sips thin liquid via straw when straw was pinched to limit the size of sip consistently with no overt s/s aspiration without using head tilt to assist liquids posteriorally.  Patient able to masticate solids and prepare bolus adequately for A-P transit with min to no oral residue. Vocal quality remained clear throughout evaluation. Laryngeal elevation appeared reduced with all consistencies. Recommend Dysphagia II diet with thin liquids with TOTAL ASSIST and SUPERVISION with all PO INTAKE, to provide SINGLE sips thin liquid by PINCHING STRAW to limit size of sip and ensure small bites of soft solids to decrease risk of aspiration. Patient with hx of advanced dementia, and was observed to be impulsive with self fed sips of thin liquid via cup. MUST have TOTAL ASSIST with ALL PO INTAKE, ensure patient is positioned fully upright before feeding. Strict aspiration precautions. Recommend give meds crushed in puree. SLP to f/u with toleration  of diet. SLP Visit Diagnosis: Dysphagia, oropharyngeal phase (R13.12)    Aspiration Risk  Mild aspiration risk;Risk for inadequate nutrition/hydration    Diet Recommendation Dysphagia 2 (Fine chop);Thin liquid   Liquid Administration via: Spoon;Straw Medication Administration: Crushed with  puree Supervision: Full supervision/cueing for compensatory strategies Compensations: Minimize environmental distractions;Slow rate;Small sips/bites;Other (Comment)(PINCH straw to control size of sip) Postural Changes: Seated upright at 90 degrees    Other  Recommendations Oral Care Recommendations: Oral care BID   Follow up Recommendations Skilled Nursing facility      Frequency and Duration min 4x/week  1 week       Prognosis Prognosis for Safe Diet Advancement: Fair Barriers to Reach Goals: Cognitive deficits      Swallow Study   General Date of Onset: 03/27/18 HPI: Per admitting H & P: [name]  is a 84 y.o. female with a known history of Alzheimer's dementia, diabetes, hypertension, hypothyroidism, hearing impairment who presents to the hospital from assisted living secondary to weakness, poor p.o. intake.  Patient herself is a very poor historian therefore most of the history obtained from the ER physician in the chart and speaking to the nurse.  Patient apparently resides at an assisted living and usually is somewhat ambulatory but over the past few days she has been feeling more weak, has had no appetite and has not been eating anything and is not in her usual state and therefore sent to the ER for further evaluation.  In the emergency room patient was noted to be slightly hypotensive, urinalysis was positive for UTI and she was noted to be in acute kidney injury.  Hospitalist services were contacted for further treatment evaluation. Type of Study: Bedside Swallow Evaluation Diet Prior to this Study: Dysphagia 3 (soft);Thin liquids Temperature Spikes Noted: No Respiratory Status: Room air History of Recent Intubation: No Behavior/Cognition: Alert;Cooperative;Impulsive;Requires cueing Oral Cavity Assessment: Within Functional Limits Oral Care Completed by SLP: No Oral Cavity - Dentition: Missing dentition Self-Feeding Abilities: Total assist Patient Positioning:  Upright in bed Baseline Vocal Quality: Normal Volitional Swallow: Able to elicit    Oral/Motor/Sensory Function Overall Oral Motor/Sensory Function: Generalized oral weakness Facial Strength: Reduced right;Reduced left Lingual Strength: Reduced Mandible: Impaired   Ice Chips Ice chips: Not tested   Thin Liquid Thin Liquid: Impaired Presentation: Cup;Straw Oral Phase Impairments: Reduced labial seal;Reduced lingual movement/coordination Oral Phase Functional Implications: Oral holding Pharyngeal  Phase Impairments: Cough - Immediate    Nectar Thick Nectar Thick Liquid: Not tested   Honey Thick Honey Thick Liquid: Not tested   Puree Puree: Impaired Presentation: Spoon Oral Phase Impairments: Reduced lingual movement/coordination Oral Phase Functional Implications: Prolonged oral transit Pharyngeal Phase Impairments: Decreased hyoid-laryngeal movement   Solid     Solid: Impaired Presentation: Self Fed Oral Phase Functional Implications: Prolonged oral transit      Mackayla Mullins, MA, CCC-SLP 03/27/2018,10:58 AM

## 2018-03-27 NOTE — Consult Note (Signed)
Consultation Note Date: 03/27/2018   Patient Name: [name]  DOB: 04/17/1928  MRN: [id number]  Age / Sex: 84 y.o., female  PCP: Roxana Hires, FNP Referring Physician: Dustin Flock, MD  Reason for Consultation: Establishing goals of care  HPI/Patient Profile: 84 y.o. female admitted on 03/25/2018 from [facility] with complaints of generalized weakness poor p.o. intake.  Patient has a history of diabetes, hypertension, Alzheimer's dementia, hypothyroidism, and hearing impairment.  Patient is a poor historian given her dementia.  Most information was obtained from patient's chart and speaking with caregivers at the facility.  According to reports patient was ambulatory prior to admission however over the past several days prior to admission she was noticeably more weaker with a decrease in appetite.  During her ED course patient was noted to be slightly hypotensive.  UA positive for UTI with acute kidney injury.  Sodium 133, potassium 5.5, glucose 198, BUN 108, creatinine 1.88, GFR 26, troponin 0 0.12, lactic acid 2.14, procalcitonin 0.10, WBC 7.3, hemoglobin 11.7, TSH 14.594.  X-ray was negative for any acute abnormalities.  Since admission patient was started on empiric Rocephin following cultures.  He was given IV fluids for rehydration and lisinopril held.  Patient was seen by SLP with recommendations for dysphagia to parent-child and thin liquid diet due to risk of aspiration and dysphasia.  Palliative medicine team consulted for goals of care discussion.  Clinical Assessment and Goals of Care: I have reviewed medical records including lab results, imaging, Epic notes, and MAR, received report from the bedside RN, and assessed the patient. I then met at the bedside with patient's caregiver Thayer Headings from [place]. Patient's daughter,  Shaletha Humble resides in [place] and is often hard to  contact via phone. Thayer Headings at facility reports she has not come to visit patient often.  I introduced Palliative Medicine as specialized medical care for people living with serious illness. It focuses on providing relief from the symptoms and stress of a serious illness. The goal is to improve quality of life for both the patient and the family.  Thayer Headings reports patient has been a resident at their facility since 2015.  Patient requires assistance with all ADLs.  She reports generally patient's appetite is well however prior to admission patient had a drastic decrease in her appetite and would often eat several bites to nothing.  Patient is incontinent x2.  We discussed her current illness and what it means in the larger context of her on-going co-morbidities.  Natural disease trajectory and expectations at EOL were discussed. Thayer Headings reports noticing a continuous decline in patient's status over the past several months.   Patient's daughter did return call to Thayer Headings and verified that her wishes were to have patient to return to [facility] with outpatient hospice services. Daughter kept phone call brief and expressed she was caring for her sick husband and could not talk long on the phone. I attempted to call daughter back to discuss patient's full code status however, daughter did not answer and voicemail was  left.   Thayer Headings reports patient will need a hospital bed at the facility and a wheelchair.   Hospice services outpatient were explained and Thayer Headings is aware that Santiago Glad, RN [facility]) will be coming in to discuss their services further. Thayer Headings verbalized understanding and appreciation.   Questions and concerns were addressed. Thayer Headings was encouraged to call with questions or concerns.  PMT will continue to support holistically.  Primary Decision Maker HCPOA-[name] Mory (Daughter)     SUMMARY OF RECOMMENDATIONS    Full Code  Continue to treat the treatable.  Daughter verbalized  wishes for patient to return to [facility] with hospice services in place.   Palliative Medicine team will continue to support patient, family, and medical team.   Code Status/Advance Care Planning:  Full code  Palliative Prophylaxis:   Aspiration, Bowel Regimen, Delirium Protocol, Frequent Pain Assessment, Oral Care and Turn Reposition  Additional Recommendations (Limitations, Scope, Preferences):  Full Scope Treatment  Psycho-social/Spiritual:   Desire for further Chaplaincy support:no  Additional Recommendations: Caregiving  Support/Resources and Education on Hospice  Prognosis:   < 6 months in the setting of advanced Alzheimer's, deconditioning, poor po intake, severe protein calorie malnutrition, acute renal failure, dehydration, diabetes, hypertension, hypothyroidism.   Discharge Planning: [facility] with oupatient hospice.       Primary Diagnoses: Present on Admission: . Acute renal failure (ARF) ([place])   I have reviewed the medical record, interviewed the patient and family, and examined the patient. The following aspects are pertinent.  Past Medical History:  Diagnosis Date  . Alzheimer's dementia ([place])   . Cellulitis of left lower extremity   . Diabetes type 2, controlled ([place])   . Hearing impairment   . Hypertension   . Hypothyroidism   . Unsteady gait   . Vision impairment    Social History   Socioeconomic History  . Marital status: Widowed    Spouse name: Not on file  . Number of children: Not on file  . Years of education: Not on file  . Highest education level: Not on file  Occupational History  . Not on file  Social Needs  . Financial resource strain: Not on file  . Food insecurity:    Worry: Not on file    Inability: Not on file  . Transportation needs:    Medical: Not on file    Non-medical: Not on file  Tobacco Use  . Smoking status: Never Smoker  . Smokeless tobacco: Never Used  Substance and Sexual Activity  . Alcohol use: No   . Drug use: No  . Sexual activity: Never  Lifestyle  . Physical activity:    Days per week: Not on file    Minutes per session: Not on file  . Stress: Not on file  Relationships  . Social connections:    Talks on phone: Not on file    Gets together: Not on file    Attends religious service: Not on file    Active member of club or organization: Not on file    Attends meetings of clubs or organizations: Not on file    Relationship status: Not on file  Other Topics Concern  . Not on file  Social History Narrative  . Not on file   Family History  Family history unknown: Yes   Scheduled Meds: . feeding supplement (ENSURE ENLIVE)  237 mL Oral TID BM  . heparin  5,000 Units Subcutaneous Q8H  . insulin aspart  0-5 Units Subcutaneous QHS  .  insulin aspart  0-9 Units Subcutaneous TID WC  . levothyroxine  88 mcg Oral Daily  . loratadine  10 mg Oral Daily  . megestrol  400 mg Oral BID  . mirtazapine  15 mg Oral QHS  . multivitamin with minerals  1 tablet Oral Daily  . sertraline  25 mg Oral Daily  . tamsulosin  0.4 mg Oral Daily  . traZODone  50 mg Oral QHS   Continuous Infusions: . sodium chloride 30 mL/hr at 03/26/18 1405  . cefTRIAXone (ROCEPHIN)  IV Stopped (03/26/18 0615)   PRN Meds:.acetaminophen **OR** acetaminophen, ondansetron **OR** ondansetron (ZOFRAN) IV Medications Prior to Admission:  Prior to Admission medications   Medication Sig Start Date End Date Taking? Authorizing Provider  acetaminophen (TYLENOL) 500 MG tablet Take 500 mg by mouth at bedtime.     [provider]  cefUROXime (CEFTIN) 500 MG tablet Take 1 tablet (500 mg total) by mouth 2 (two) times daily for 5 days. 03/27/18 04/01/18  Dustin Flock, MD  dicyclomine (BENTYL) 10 MG capsule Take 10 mg by mouth 3 (three) times daily before meals.     [provider]  feeding supplement, ENSURE ENLIVE, (ENSURE ENLIVE) LIQD Take 237 mLs by mouth 3 (three) times daily between meals. 03/27/18    Dustin Flock, MD  gabapentin (NEURONTIN) 100 MG capsule Take 100 mg by mouth daily.     [provider]  levothyroxine (SYNTHROID, LEVOTHROID) 88 MCG tablet Take 88 mcg by mouth daily.     [provider]  lisinopril (PRINIVIL,ZESTRIL) 5 MG tablet Take 5 mg by mouth daily.    [provider]  loperamide (IMODIUM) 2 MG capsule See admin instructions. Take 2 capsules (4MG) by mouth after first loose stool and 1 capsule (2MG) by mouth after each subsequent loose stool - max 16MG daily    [provider]  loratadine (CLARITIN) 10 MG tablet Take 10 mg by mouth daily.    [provider]  megestrol (MEGACE) 400 MG/10ML suspension Take 10 mLs (400 mg total) by mouth 2 (two) times daily. 03/27/18   Dustin Flock, MD  metFORMIN (GLUCOPHAGE) 500 MG tablet Take 500 mg by mouth 2 (two) times daily with a meal.     [provider]  mirtazapine (REMERON) 15 MG tablet Take 15 mg by mouth at bedtime.    [provider]  Multiple Vitamin (MULTIVITAMIN WITH MINERALS) TABS tablet Take 1 tablet by mouth daily. 03/27/18   Dustin Flock, MD  sertraline (ZOLOFT) 50 MG tablet Take 75 mg by mouth daily.     [provider]  tamsulosin (FLOMAX) 0.4 MG CAPS capsule Take 1 capsule (0.4 mg total) by mouth daily. 02/03/15   Zara Council A, PA-C  traMADol (ULTRAM) 50 MG tablet Take 1 tablet (50 mg total) by mouth every 6 (six) hours as needed. 01/30/18   Henreitta Leber, MD  traZODone (DESYREL) 50 MG tablet Take 50 mg by mouth at bedtime.    [provider]   No Known Allergies Review of Systems  Unable to perform ROS: Dementia    Physical Exam  Constitutional: Vital signs are normal. She appears cachectic. She is cooperative. She appears ill.  Thin, frail, chronically ill appearing   Cardiovascular: Normal rate, regular rhythm, normal heart sounds and normal pulses.  Pulmonary/Chest: Effort normal. She has decreased breath sounds.    Abdominal: Soft. Normal appearance and bowel sounds are normal.  Neurological: She is alert. She is disoriented. She displays atrophy.  Skin: Skin is warm and dry. Bruising noted.  Psychiatric: Cognition and memory are impaired. She expresses inappropriate judgment.  Nursing note and vitals reviewed.   Vital Signs: BP 125/78 (BP Location: Left Arm)   Pulse 76   Temp 97.8 F (36.6 C) (Oral)   Resp 20   Ht '4\' 11"'  (1.499 m)   Wt 46.7 kg   SpO2 100%   BMI 20.79 kg/m  Pain Scale: PAINAD       SpO2: SpO2: 100 % O2 Device:SpO2: 100 % O2 Flow Rate: .   IO: Intake/output summary:   Intake/Output Summary (Last 24 hours) at 03/27/2018 1125 Last data filed at 03/27/2018 1123 Gross per 24 hour  Intake 1119.11 ml  Output 1200 ml  Net -80.89 ml    LBM: Last BM Date: 03/25/18 Baseline Weight: Weight: 46.7 kg Most recent weight: Weight: 46.7 kg     Palliative Assessment/Data: PPS 20%     Time In: 0915 Time Out: 1000 Time Total: 45 min  Greater than 50%  of this time was spent counseling and coordinating care related to the above assessment and plan.  Signed by:  Alda Lea, AGPCNP-BC Palliative Medicine Team  Phone: [phone] Fax: [phone] Pager: [phone] Amion: Bjorn Pippin    Please contact Palliative Medicine Team phone at [phone] for questions and concerns.  For individual provider: See Shea Evans

## 2018-03-27 NOTE — Progress Notes (Signed)
New referral for [facility] services at [facility]. Please note patient was seen at [facility] by the hospice team and they were awaiting a call back from her daughter regarding consents for hospice services. She was admitted to [facility] on 11[facility]16 for weakness and poor oral intake before her daughter contacted the hospice team back. Alvis LemmingsJanice Worth, [facility] in touch with patient's daughter, who has agreed to hospice services and is aware the team will continue to attempt to reach her after patient discharges. Writer spoke with Alvis LemmingsJanice Worth in the room, she has requested a hospital bed and wheel chair, DME ordered for delivery today. Plan is for discharge today. Updated notes faxed to referral. Dayna BarkerKaren Robertson RN, BSN, [facility], hospital Liaison [phone]

## 2018-03-27 NOTE — Discharge Summary (Signed)
[facility] - [place] at [facility], 84 y.o., DOB 1927-06-05, MRN [id number]. Admission date: 03/25/2018 Discharge Date 03/27/2018 Primary MD Clemetine Marker, FNP Admitting Physician Houston Siren, MD  Admission Diagnosis  Advanced dementia [facility]) [F03.90] AKI (acute kidney injury) (HCC) [N17.9] Cystitis [N30.90] Sepsis, due to unspecified organism, unspecified whether acute organ dysfunction present [facility]) [A41.9]  Discharge Diagnosis   Active Problems: Sepsis due to UTI Elevated troponin due to demand ischemia Acute kidney injury due to dehydration Generalized weakness and fatigue Chronic dementia Diabetes type 2 Diabetic neuropathy Hypothyroidism Depression   Hospital Course  Patient is 84 year old who lives in a [facility] facility.  She has a history of Alzheimer's dementia, diabetes, hypertension, hypothyroidism who presented to the hospital secondary to weakness and poor p.o. intake.  Patient was very weak on admission.  And was given IV fluids.  She also was noted to have urinary tract infection.  Was treated with IV fluids and IV antibiotics.  Patient even at her assisted facility was being set up for outpatient hospice to follow her there.  Which currently has been arranged.  PT recommended skilled nursing facility however patient daughter has chose for her to go back to the facility as she has been there since 2015.   Prognosis poor         Consults  None  Significant Tests:  See full reports for all details     Dg Chest Port 1 View  Result Date: 03/25/2018 CLINICAL DATA:  Weakness EXAM: PORTABLE CHEST 1 VIEW COMPARISON:  01/30/2018 FINDINGS: Lungs are clear.  No pleural effusion or pneumothorax. The heart is normal in size. IMPRESSION: No evidence of acute cardiopulmonary disease. Electronically Signed   By: Charline Bills M.D.   On: 03/25/2018 16:24       Today   Subjective:   [name] patient at  baseline confused dementia Objective:   Blood pressure 125/78, pulse 76, temperature 97.8 F (36.6 C), temperature source Oral, resp. rate 20, height 4\' 11"  (1.499 m), weight 46.7 kg, SpO2 100 %.  .  Intake/Output Summary (Last 24 hours) at 03/27/2018 1058 Last data filed at 03/27/2018 0944 Gross per 24 hour  Intake 1119.11 ml  Output 800 ml  Net 319.11 ml    Exam VITAL SIGNS: Blood pressure 125/78, pulse 76, temperature 97.8 F (36.6 C), temperature source Oral, resp. rate 20, height 4\' 11"  (1.499 m), weight 46.7 kg, SpO2 100 %.  GENERAL:  84 y.o.-year-old patient lying in the bed with no acute distress.  EYES: Pupils equal, round, reactive to light and accommodation. No scleral icterus. Extraocular muscles intact.  HEENT: Head atraumatic, normocephalic. Oropharynx and nasopharynx clear.  NECK:  Supple, no jugular venous distention. No thyroid enlargement, no tenderness.  LUNGS: Normal breath sounds bilaterally, no wheezing, rales,rhonchi or crepitation. No use of accessory muscles of respiration.  CARDIOVASCULAR: S1, S2 normal. No murmurs, rubs, or gallops.  ABDOMEN: Soft, nontender, nondistended. Bowel sounds present. No organomegaly or mass.  EXTREMITIES: No pedal edema, cyanosis, or clubbing.  NEUROLOGIC:  unable to follow commands.  Awake PSYCHIATRIC: The patient is alert not oriented  sKIN: No obvious rash, lesion, or ulcer.   Data Review     CBC w Diff:  Lab Results  Component Value Date   WBC 7.3 03/25/2018   HGB 11.7 (L) 03/25/2018   HGB 12.9 10/01/2011   HCT 35.5 (L) 03/25/2018   HCT 39.5 10/01/2011   PLT 316 03/25/2018   PLT 241  10/01/2011   LYMPHOPCT 27 03/25/2018   MONOPCT 4 03/25/2018   EOSPCT 0 03/25/2018   BASOPCT 0 03/25/2018   CMP:  Lab Results  Component Value Date   NA 138 03/26/2018   NA 140 10/01/2011   K 4.3 03/26/2018   K 4.0 10/01/2011   CL 109 03/26/2018   CL 102 10/01/2011   CO2 21 (L) 03/26/2018   CO2 30 10/01/2011   BUN 96 (H)  03/26/2018   BUN 20 (H) 10/01/2011   CREATININE 1.35 (H) 03/26/2018   CREATININE 0.78 10/01/2011   PROT 7.4 03/25/2018   PROT 7.7 10/01/2011   ALBUMIN 3.7 03/25/2018   ALBUMIN 3.4 10/01/2011   BILITOT 0.7 03/25/2018   BILITOT 0.2 10/01/2011   ALKPHOS 58 03/25/2018   ALKPHOS 69 10/01/2011   AST 25 03/25/2018   AST 22 10/01/2011   ALT 11 03/25/2018   ALT 26 10/01/2011  .  Micro Results Recent Results (from the past 240 hour(s))  Blood Culture (routine x 2)     Status: None (Preliminary result)   Collection Time: 03/25/18  4:04 PM  Result Value Ref Range Status   Specimen Description BLOOD RIGHT ANTECUBITAL  Final   Special Requests   Final    BOTTLES DRAWN AEROBIC AND ANAEROBIC Blood Culture adequate volume   Culture   Final    NO GROWTH 2 DAYS Performed at [facility], [address] [place], [place] [address]    Report Status PENDING  Incomplete  Urine culture     Status: Abnormal (Preliminary result)   Collection Time: 03/25/18  4:51 PM  Result Value Ref Range Status   Specimen Description   Final    URINE, RANDOM Performed at [facility], [address] [place], [place] [address]    Special Requests   Final    NONE Performed at [facility], [address], [place], [place] [address]    Culture >=100,000 COLONIES/mL ESCHERICHIA COLI (A)  Final   Report Status PENDING  Incomplete        Code Status Orders  (From admission, onward)         Start     Ordered   03/25/18 1858  Full code  Continuous     03/25/18 1857        Code Status History    Date Active Date Inactive Code Status Order ID Comments User Context   01/27/2018 2301 01/31/2018 1849 Full Code [id number]  Oralia Manis, MD Inpatient   04/12/2015 1215 04/14/2015 2103 Full Code [id number]  Linus Galas, MD Inpatient   04/11/2015 1012 04/12/2015 1215 Full Code [id number]  Alford Highland, MD ED   01/08/2015 1700 01/11/2015 1821 Full Code [id number]  [name], MD  Inpatient   01/07/2015 2031 01/08/2015 1700 Full Code [id number]  Houston Siren, MD Inpatient   01/07/2015 1521 01/07/2015 2031 Full Code [id number]  [name], MD ED            Discharge Medications   Allergies as of 03/27/2018   No Known Allergies     Medication List    TAKE these medications   acetaminophen 500 MG tablet Commonly known as:  TYLENOL Take 500 mg by mouth at bedtime.   cefUROXime 500 MG tablet Commonly known as:  CEFTIN Take 1 tablet (500 mg total) by mouth 2 (two) times daily for 5 days.   dicyclomine 10 MG capsule Commonly known as:  BENTYL Take 10 mg by mouth 3 (three) times  daily before meals.   feeding supplement (ENSURE ENLIVE) Liqd Take 237 mLs by mouth 3 (three) times daily between meals.   gabapentin 100 MG capsule Commonly known as:  NEURONTIN Take 100 mg by mouth daily.   levothyroxine 88 MCG tablet Commonly known as:  SYNTHROID, LEVOTHROID Take 88 mcg by mouth daily.   lisinopril 5 MG tablet Commonly known as:  PRINIVIL,ZESTRIL Take 5 mg by mouth daily.   loperamide 2 MG capsule Commonly known as:  IMODIUM See admin instructions. Take 2 capsules (4MG ) by mouth after first loose stool and 1 capsule (2MG ) by mouth after each subsequent loose stool - max 16MG  daily   loratadine 10 MG tablet Commonly known as:  CLARITIN Take 10 mg by mouth daily.   megestrol 400 MG/10ML suspension Commonly known as:  MEGACE Take 10 mLs (400 mg total) by mouth 2 (two) times daily.   metFORMIN 500 MG tablet Commonly known as:  GLUCOPHAGE Take 500 mg by mouth 2 (two) times daily with a meal.   mirtazapine 15 MG tablet Commonly known as:  REMERON Take 15 mg by mouth at bedtime.   multivitamin with minerals Tabs tablet Take 1 tablet by mouth daily.   sertraline 50 MG tablet Commonly known as:  ZOLOFT Take 75 mg by mouth daily.   tamsulosin 0.4 MG Caps capsule Commonly known as:  FLOMAX Take 1 capsule (0.4 mg total) by mouth daily.    traMADol 50 MG tablet Commonly known as:  ULTRAM Take 1 tablet (50 mg total) by mouth every 6 (six) hours as needed.   traZODone 50 MG tablet Commonly known as:  DESYREL Take 50 mg by mouth at bedtime.          Total Time in preparing paper work, data evaluation and todays exam - 35 minutes  Auburn BilberryShreyang Corin Tilly M.D on 03/27/2018 at 10:58 AM [facility]   Office  [phone]

## 2018-03-27 NOTE — Clinical Social Work Note (Signed)
Patient is medically ready for discharge today. CSW [facility] with [facility]ice [facility] and also to patient's daughter Elouise MunroeMarilyn Schlink [phone]. Both are in agreement with discharge back to [facility] today. Patient will need to be transported by EMS due to weakness and inability to transfer safely. RN will call for transport.   Ruthe Mannanandace Emanual Lamountain MSW, [address]CSWA [phone]

## 2018-03-29 LAB — URINE CULTURE

## 2018-03-30 LAB — CULTURE, BLOOD (ROUTINE X 2)
Culture: NO GROWTH
Special Requests: ADEQUATE

## 2019-07-20 ENCOUNTER — Inpatient Hospital Stay
Admission: EM | Admit: 2019-07-20 | Discharge: 2019-07-25 | DRG: 522 | Disposition: A | Discharge status: Skilled Nursing Facility | Payer: Medicare Other | Source: Skilled Nursing Facility | Attending: Internal Medicine | Admitting: Internal Medicine

## 2019-07-20 ENCOUNTER — Other Ambulatory Visit: Payer: Self-pay

## 2019-07-20 ENCOUNTER — Emergency Department: Payer: Medicare Other

## 2019-07-20 DIAGNOSIS — W050XXA Fall from non-moving wheelchair, initial encounter: Secondary | ICD-10-CM | POA: Diagnosis present

## 2019-07-20 DIAGNOSIS — F0280 Dementia in other diseases classified elsewhere without behavioral disturbance: Secondary | ICD-10-CM | POA: Diagnosis present

## 2019-07-20 DIAGNOSIS — I10 Essential (primary) hypertension: Secondary | ICD-10-CM | POA: Diagnosis not present

## 2019-07-20 DIAGNOSIS — E119 Type 2 diabetes mellitus without complications: Secondary | ICD-10-CM | POA: Diagnosis present

## 2019-07-20 DIAGNOSIS — N179 Acute kidney failure, unspecified: Secondary | ICD-10-CM | POA: Diagnosis not present

## 2019-07-20 DIAGNOSIS — S72012A Unspecified intracapsular fracture of left femur, initial encounter for closed fracture: Secondary | ICD-10-CM | POA: Diagnosis present

## 2019-07-20 DIAGNOSIS — E785 Hyperlipidemia, unspecified: Secondary | ICD-10-CM | POA: Diagnosis present

## 2019-07-20 DIAGNOSIS — E039 Hypothyroidism, unspecified: Secondary | ICD-10-CM | POA: Diagnosis not present

## 2019-07-20 DIAGNOSIS — D696 Thrombocytopenia, unspecified: Secondary | ICD-10-CM | POA: Diagnosis present

## 2019-07-20 DIAGNOSIS — G309 Alzheimer's disease, unspecified: Secondary | ICD-10-CM | POA: Diagnosis present

## 2019-07-20 DIAGNOSIS — Z96649 Presence of unspecified artificial hip joint: Secondary | ICD-10-CM

## 2019-07-20 DIAGNOSIS — D62 Acute posthemorrhagic anemia: Secondary | ICD-10-CM | POA: Diagnosis not present

## 2019-07-20 DIAGNOSIS — D72829 Elevated white blood cell count, unspecified: Secondary | ICD-10-CM | POA: Diagnosis present

## 2019-07-20 DIAGNOSIS — Z20822 Contact with and (suspected) exposure to covid-19: Secondary | ICD-10-CM | POA: Diagnosis present

## 2019-07-20 DIAGNOSIS — S72002A Fracture of unspecified part of neck of left femur, initial encounter for closed fracture: Secondary | ICD-10-CM | POA: Diagnosis present

## 2019-07-20 DIAGNOSIS — R509 Fever, unspecified: Secondary | ICD-10-CM | POA: Diagnosis present

## 2019-07-20 DIAGNOSIS — S72002S Fracture of unspecified part of neck of left femur, sequela: Secondary | ICD-10-CM | POA: Diagnosis not present

## 2019-07-20 DIAGNOSIS — K5900 Constipation, unspecified: Secondary | ICD-10-CM | POA: Diagnosis not present

## 2019-07-20 DIAGNOSIS — E7849 Other hyperlipidemia: Secondary | ICD-10-CM | POA: Diagnosis not present

## 2019-07-20 DIAGNOSIS — Y92099 Unspecified place in other non-institutional residence as the place of occurrence of the external cause: Secondary | ICD-10-CM

## 2019-07-20 DIAGNOSIS — I160 Hypertensive urgency: Secondary | ICD-10-CM | POA: Diagnosis not present

## 2019-07-20 LAB — CBC
HCT: 36.8 % (ref 36.0–46.0)
Hemoglobin: 12.1 g/dL (ref 12.0–15.0)
MCH: 29 pg (ref 26.0–34.0)
MCHC: 32.9 g/dL (ref 30.0–36.0)
MCV: 88.2 fL (ref 80.0–100.0)
Platelets: 204 10*3/uL (ref 150–400)
RBC: 4.17 MIL/uL (ref 3.87–5.11)
RDW: 13.1 % (ref 11.5–15.5)
WBC: 12.5 10*3/uL — ABNORMAL HIGH (ref 4.0–10.5)
nRBC: 0 % (ref 0.0–0.2)

## 2019-07-20 LAB — PROTIME-INR
INR: 0.9 (ref 0.8–1.2)
Prothrombin Time: 12.2 seconds (ref 11.4–15.2)

## 2019-07-20 LAB — BASIC METABOLIC PANEL
Anion gap: 12 (ref 5–15)
BUN: 21 mg/dL (ref 8–23)
CO2: 23 mmol/L (ref 22–32)
Calcium: 9.1 mg/dL (ref 8.9–10.3)
Chloride: 101 mmol/L (ref 98–111)
Creatinine, Ser: 1.06 mg/dL — ABNORMAL HIGH (ref 0.44–1.00)
GFR calc Af Amer: 54 mL/min — ABNORMAL LOW (ref >60)
GFR calc non Af Amer: 47 mL/min — ABNORMAL LOW (ref >60)
Glucose, Bld: 212 mg/dL — ABNORMAL HIGH (ref 70–99)
Potassium: 4.5 mmol/L (ref 3.5–5.1)
Sodium: 136 mmol/L (ref 135–145)

## 2019-07-20 MED ORDER — FENTANYL CITRATE (PF) 100 MCG/2ML IJ SOLN
25.0000 ug | Freq: Once | INTRAMUSCULAR | Status: AC
  Administered 2019-07-20: 18:00:00 25 ug via INTRAVENOUS
  Filled 2019-07-20: qty 2

## 2019-07-20 MED ORDER — ONDANSETRON HCL 4 MG/2ML IJ SOLN: 4.0000 mg | Freq: Four times a day (QID) | INTRAMUSCULAR | Status: DC | PRN

## 2019-07-20 MED ORDER — MORPHINE SULFATE (PF) 2 MG/ML IV SOLN
2.0000 mg | INTRAVENOUS | Status: DC | PRN
  Administered 2019-07-20 – 2019-07-23 (×2): 2 mg via INTRAVENOUS
  Filled 2019-07-20 (×2): qty 1

## 2019-07-20 MED ORDER — FENTANYL CITRATE (PF) 100 MCG/2ML IJ SOLN
12.5000 ug | Freq: Once | INTRAMUSCULAR | Status: AC
  Administered 2019-07-20: 12.5 ug via INTRAVENOUS
  Filled 2019-07-20: qty 2

## 2019-07-20 MED ORDER — MIRTAZAPINE 15 MG PO TABS
15.0000 mg | ORAL_TABLET | Freq: Every day | ORAL | Status: DC
  Administered 2019-07-23 – 2019-07-24 (×2): 15 mg via ORAL
  Filled 2019-07-20 (×4): qty 1

## 2019-07-20 MED ORDER — HYDRALAZINE HCL 20 MG/ML IJ SOLN
10.0000 mg | INTRAMUSCULAR | Status: DC | PRN
  Administered 2019-07-20: 10 mg via INTRAVENOUS
  Filled 2019-07-20: qty 0.5
  Filled 2019-07-20: qty 1

## 2019-07-20 MED ORDER — SODIUM CHLORIDE 0.9 % IV SOLN: INTRAVENOUS | Status: DC

## 2019-07-20 MED ORDER — SENNOSIDES-DOCUSATE SODIUM 8.6-50 MG PO TABS
2.0000 | ORAL_TABLET | Freq: Two times a day (BID) | ORAL | Status: DC
  Administered 2019-07-20 – 2019-07-24 (×5): 2 via ORAL
  Filled 2019-07-20 (×8): qty 2

## 2019-07-20 MED ORDER — MAGNESIUM HYDROXIDE 400 MG/5ML PO SUSP: 30.0000 mL | Freq: Every day | ORAL | Status: DC | PRN

## 2019-07-20 MED ORDER — CEFAZOLIN SODIUM-DEXTROSE 2-4 GM/100ML-% IV SOLN
2.0000 g | Freq: Once | INTRAVENOUS | Status: AC
  Administered 2019-07-21: 2 g via INTRAVENOUS
  Filled 2019-07-20 (×2): qty 100

## 2019-07-20 MED ORDER — ACETAMINOPHEN 650 MG RE SUPP
650.0000 mg | Freq: Four times a day (QID) | RECTAL | Status: DC | PRN
  Administered 2019-07-21 – 2019-07-23 (×3): 650 mg via RECTAL
  Filled 2019-07-20 (×3): qty 1

## 2019-07-20 MED ORDER — MUPIROCIN 2 % EX OINT
1.0000 "application " | TOPICAL_OINTMENT | Freq: Two times a day (BID) | CUTANEOUS | Status: DC
  Filled 2019-07-20: qty 22

## 2019-07-20 MED ORDER — ACETAMINOPHEN 325 MG PO TABS
650.0000 mg | ORAL_TABLET | Freq: Four times a day (QID) | ORAL | Status: DC | PRN
  Filled 2019-07-20: qty 2

## 2019-07-20 MED ORDER — TRAZODONE HCL 50 MG PO TABS
50.0000 mg | ORAL_TABLET | Freq: Every day | ORAL | Status: DC
  Administered 2019-07-23 – 2019-07-24 (×2): 50 mg via ORAL
  Filled 2019-07-20 (×4): qty 1

## 2019-07-20 MED ORDER — LABETALOL HCL 5 MG/ML IV SOLN
20.0000 mg | INTRAVENOUS | Status: DC | PRN
  Administered 2019-07-21: 20 mg via INTRAVENOUS
  Filled 2019-07-20: qty 4

## 2019-07-20 MED ORDER — OXYCODONE-ACETAMINOPHEN 5-325 MG PO TABS: 1.0000 | ORAL_TABLET | ORAL | Status: DC | PRN

## 2019-07-20 MED ORDER — ATORVASTATIN CALCIUM 20 MG PO TABS
20.0000 mg | ORAL_TABLET | Freq: Every day | ORAL | Status: DC
  Administered 2019-07-22 – 2019-07-24 (×2): 20 mg via ORAL
  Filled 2019-07-20 (×5): qty 1

## 2019-07-20 MED ORDER — ENSURE ENLIVE PO LIQD
237.0000 mL | Freq: Three times a day (TID) | ORAL | Status: DC
  Administered 2019-07-21 – 2019-07-25 (×9): 237 mL via ORAL

## 2019-07-20 MED ORDER — LEVOTHYROXINE SODIUM 25 MCG PO TABS
25.0000 ug | ORAL_TABLET | Freq: Every day | ORAL | Status: DC
  Administered 2019-07-25: 06:00:00 25 ug via ORAL
  Filled 2019-07-20 (×3): qty 1

## 2019-07-20 MED ORDER — TRAZODONE HCL 50 MG PO TABS: 25.0000 mg | ORAL_TABLET | Freq: Every evening | ORAL | Status: DC | PRN

## 2019-07-20 MED ORDER — ONDANSETRON HCL 4 MG PO TABS: 4.0000 mg | ORAL_TABLET | Freq: Four times a day (QID) | ORAL | Status: DC | PRN

## 2019-07-20 MED ORDER — GABAPENTIN 100 MG PO CAPS
100.0000 mg | ORAL_CAPSULE | Freq: Every day | ORAL | Status: DC
  Administered 2019-07-20 – 2019-07-24 (×3): 100 mg via ORAL
  Filled 2019-07-20 (×7): qty 1

## 2019-07-20 NOTE — ED Notes (Signed)
Report given to Daria RN on the floor who is concerned about the patient's blood pressure. Hospitalist paged.

## 2019-07-20 NOTE — H&P (Signed)
at [facility]   PATIENT NAME: [name]    MR#:  [id number]  DATE OF BIRTH:  Dec 10, 1927  DATE OF ADMISSION:  07/20/2019  PRIMARY CARE PHYSICIAN: Clemetine Marker, FNP   REQUESTING/REFERRING PHYSICIAN: Sharyn Creamer, MD  CHIEF COMPLAINT:   Chief Complaint  Patient presents with  . Hip Pain    HISTORY OF PRESENT ILLNESS:  [name]  is a 86 y.o. African-American female with a known history of Alzheimer's dementia, type 2 diabetes mellitus, hypertension and hypothyroidism, who presented to the emergency room with acute onset of left hip pain with inability to bear weight on today after having an unwitnessed fall yesterday.  The patient was nonverbal during my interview and therefore no history could be obtained.  No reported chest pain or dyspnea or cough or wheezing or fever or chills.  No reported dysuria or oliguria or hematuria or flank pain.  No reported nausea or vomiting or abdominal pain.  Upon presentation to the emergency room, blood pressure was 164/83 and later 226/91 with otherwise normal vital signs.  Labs revealed blood glucose of 212 and CBC showed mild leukocytosis of 12.5.  COVID-19 PCR is pending.  Portable chest ray showed no acute cardiopulmonary disease.  Left hip x-ray revealed left femoral neck fracture.  With impaction and angulation at the fracture site. EKG showed normal sinus rhythm with a rate of 85.  Dr. Greta Doom was notified about the patient and evaluated the patient in the ER.  She will be admitted to a med-surg bed for further management.  PAST MEDICAL HISTORY:   Past Medical History:  Diagnosis Date  . Alzheimer's dementia (HCC)   . Cellulitis of left lower extremity   . Diabetes type 2, controlled (HCC)   . Hearing impairment   . Hypertension   . Hypothyroidism   . Unsteady gait   . Vision impairment     PAST SURGICAL HISTORY:   Past Surgical History:  Procedure Laterality Date  . HIP ARTHROPLASTY Right  01/08/2015   Procedure: ARTHROPLASTY BIPOLAR HIP (HEMIARTHROPLASTY);  Surgeon: [name], MD;  Location: [facility] ORS;  Service: Orthopedics;  Laterality: Right;  . IRRIGATION AND DEBRIDEMENT FOOT Left 04/12/2015   Procedure: IRRIGATION AND DEBRIDEMENT FOOT;  Surgeon: Linus Galas, MD;  Location: [facility] ORS;  Service: Podiatry;  Laterality: Left;  . NO PAST SURGERIES    . PERIPHERAL VASCULAR CATHETERIZATION Left 07/07/2015   Procedure: Lower Extremity Angiography;  Surgeon: Annice Needy, MD;  Location: [facility] INV[facility]IVE [facility];  Service: Cardiovascular;  Laterality: Left;    SOCIAL HISTORY:   Social History   Tobacco Use  . Smoking status: Never Smoker  . Smokeless tobacco: Never Used  Substance Use Topics  . Alcohol use: No    FAMILY HISTORY:   Family History  Family history unknown: Yes    DRUG ALLERGIES:  No Known Allergies  REVIEW OF SYSTEMS:   ROS As per history of present illness. All pertinent systems were reviewed above. Constitutional,  HEENT, cardiovascular, respiratory, GI, GU, musculoskeletal, neuro, psychiatric, endocrine,  integumentary and hematologic systems were reviewed and are otherwise  negative/unremarkable except for positive findings mentioned above in the HPI.   MEDICATIONS AT HOME:   Prior to Admission medications   Medication Sig Start Date End Date Taking? Authorizing Provider  acetaminophen (TYLENOL) 500 MG tablet Take 500 mg by mouth every 8 (eight) hours.    Yes [provider]  atorvastatin (LIPITOR) 20 MG tablet Take 20 mg  by mouth daily.   Yes [provider]  feeding supplement, ENSURE ENLIVE, (ENSURE ENLIVE) LIQD Take 237 mLs by mouth 3 (three) times daily between meals. 03/27/18  Yes Auburn Bilberry, MD  gabapentin (NEURONTIN) 100 MG capsule Take 100 mg by mouth daily.    Yes [provider]  levothyroxine (SYNTHROID) 25 MCG tablet Take 25 mcg by mouth daily before breakfast.    Yes [provider]    mirtazapine (REMERON) 15 MG tablet Take 15 mg by mouth at bedtime.   Yes [provider]  sennosides-docusate sodium (SENOKOT-S) 8.6-50 MG tablet Take 2 tablets by mouth 2 (two) times daily.   Yes [provider]  traZODone (DESYREL) 50 MG tablet Take 50 mg by mouth at bedtime.   Yes [provider]      VITAL SIGNS:  Blood pressure (!) 164/83, pulse 97, temperature 99.7 F (37.6 C), temperature source Oral, resp. rate 18, height 5\' 4"  (1.626 m), weight 49.9 kg, SpO2 96 %.  PHYSICAL EXAMINATION:  Physical Exam  GENERAL:  85 y.o.-year-old African-American female patient lying in the bed with no acute distress.  EYES: Pupils equal, round, reactive to light and accommodation. No scleral icterus. Extraocular muscles intact.  HEENT: Head atraumatic, normocephalic. Oropharynx and nasopharynx clear.  NECK:  Supple, no jugular venous distention. No thyroid enlargement, no tenderness.  LUNGS: Normal breath sounds bilaterally, no wheezing, rales,rhonchi or crepitation. No use of accessory muscles of respiration.  CARDIOVASCULAR: Regular rate and rhythm, S1, S2 normal. No murmurs, rubs, or gallops.  ABDOMEN: Soft, nondistended, nontender. Bowel sounds present. No organomegaly or mass.  EXTREMITIES: No pedal edema, cyanosis, or clubbing. Musculoskeletal: Left lower extremity is externally rotated.  She had left hip tenderness. NEUROLOGIC: Cranial nerves II through XII are intact. Muscle strength 5/5 in all extremities. Sensation intact. Gait not checked.  PSYCHIATRIC: The patient is alert and oriented x 3.  Normal affect and good eye contact. SKIN: No obvious rash, lesion, or ulcer.   LABORATORY PANEL:   CBC Recent Labs  Lab 07/20/19 1817  WBC 12.5*  HGB 12.1  HCT 36.8  PLT 204   ------------------------------------------------------------------------------------------------------------------  Chemistries  Recent Labs  Lab 07/20/19 1817  NA 136  K 4.5  CL  101  CO2 23  GLUCOSE 212*  BUN 21  CREATININE 1.06*  CALCIUM 9.1   ------------------------------------------------------------------------------------------------------------------  Cardiac Enzymes No results for input(s): TROPONINI in the last 168 hours. ------------------------------------------------------------------------------------------------------------------  RADIOLOGY:  DG Chest 1 View  Result Date: 07/20/2019 CLINICAL DATA:  Recent fall with known left hip fracture EXAM: CHEST  1 VIEW COMPARISON:  03/25/18 FINDINGS: Cardiac shadow is mildly enlarged. Lungs are well aerated bilaterally. No focal infiltrate or sizable effusion is seen. Lungs are clear bilaterally. Nipple shadows are noted bilaterally. No acute bony abnormality is seen. IMPRESSION: No acute abnormality noted. Electronically Signed   By: 03/27/18 M.D.   On: 07/20/2019 18:11   DG Hip Unilat W or Wo Pelvis 2-3 Views Left  Result Date: 07/20/2019 CLINICAL DATA:  Left hip pain following fall, initial encounter EXAM: DG HIP (WITH OR WITHOUT PELVIS) 3V LEFT COMPARISON:  None. FINDINGS: Subcapital left femoral neck fracture is noted with impaction and angulation at the fracture site. Pelvic ring is otherwise intact. Right hip replacement is seen. Calcified uterine fibroids are noted. IMPRESSION: Left femoral neck fracture with impaction and angulation at the fracture site. Electronically Signed   By: 09/19/2019 M.D.   On: 07/20/2019 18:09  IMPRESSION AND PLAN:   1.  Left hip fracture like secondary to unwitnessed fall. -The patient will be admitted to a med-surg monitor bed. -Pain management will be provided. -She will be kept n.p.o. after midnight. -An orthopedic consultation was obtained by Dr. Roland Rack who is planning to operate in a.m. after getting consent from the patient's daughter. -The patient has no history of CHF, CVA, renal failure with creatinine more than 2, coronary artery disease,.  She is  diabetic but not on insulin.  She is considered at average risk for her age for perioperative cardiovascular events per the revised cardiac risk index.  She has no current pulmonary issues.  2.  Hypertensive urgency. -The patient will be placed on as needed IV labetalol. -We will place her on p.o. Norvasc.  3.  Dyslipidemia. -Statin therapy will be resumed at should provide perioperative cardiovascular protection.  4.  Hypothyroidism. -We will check TSH and continue Synthroid.  5. Type 2 diabetes mellitus. -The patient will be placed on supplement coverage with NovoLog.  6.  DVT prophylaxis. -SCDs. -Medical prophylaxis is currently held off given expected operative intervention.   All the records are reviewed and case discussed with ED provider. The plan of care was discussed in details with the patient (and family). I answered all questions. The patient agreed to proceed with the above mentioned plan. Further management will depend upon hospital course.   CODE STATUS: Full code  TOTAL TIME TAKING CARE OF THIS PATIENT: 55 minutes.    Christel Mormon M.D on 07/20/2019 at 7:38 PM  [facility]   From 7 PM-7 AM, contact night-coverage [email or web address]  CC: Primary care physician; Roxana Hires, FNP   Note: This dictation was prepared with Dragon dictation along with smaller phrase technology. Any transcriptional errors that result from this process are unintentional.

## 2019-07-20 NOTE — Consult Note (Signed)
ORTHOPAEDIC CONSULTATION  REQUESTING PHYSICIAN: Sharyn Creamer, MD  Chief Complaint:   Left hip pain.  History of Present Illness: [name] is a 86 y.o. female with multiple medical problems including type 2 diabetes, hypertension, hypothyroidism, dementia, hearing impairment, and vision impairment who lives in an assisted living facility.  Apparently, the patient sustained an unwitnessed fall yesterday.  Because of the inability to bear weight today, she was sent to the emergency room where x-rays demonstrated a displaced left femoral neck fracture.  The patient is a poor historian and cannot provide any details as to what may have happened.  There does not appear to be any associated injuries.  Past Medical History:  Diagnosis Date  . Alzheimer's dementia (HCC)   . Cellulitis of left lower extremity   . Diabetes type 2, controlled (HCC)   . Hearing impairment   . Hypertension   . Hypothyroidism   . Unsteady gait   . Vision impairment    Past Surgical History:  Procedure Laterality Date  . HIP ARTHROPLASTY Right 01/08/2015   Procedure: ARTHROPLASTY BIPOLAR HIP (HEMIARTHROPLASTY);  Surgeon: [name], MD;  Location: [facility] ORS;  Service: Orthopedics;  Laterality: Right;  . IRRIGATION AND DEBRIDEMENT FOOT Left 04/12/2015   Procedure: IRRIGATION AND DEBRIDEMENT FOOT;  Surgeon: Linus Galas, MD;  Location: [facility] ORS;  Service: Podiatry;  Laterality: Left;  . NO PAST SURGERIES    . PERIPHERAL VASCULAR CATHETERIZATION Left 07/07/2015   Procedure: Lower Extremity Angiography;  Surgeon: Annice Needy, MD;  Location: [facility];  Service: Cardiovascular;  Laterality: Left;   Social History   Socioeconomic History  . Marital status: Widowed    Spouse name: Not on file  . Number of children: Not on file  . Years of education: Not on file  . Highest education level: Not on file  Occupational History  . Not on file   Tobacco Use  . Smoking status: Never Smoker  . Smokeless tobacco: Never Used  Substance and Sexual Activity  . Alcohol use: No  . Drug use: No  . Sexual activity: Never  Other Topics Concern  . Not on file  Social History Narrative  . Not on file   Social Determinants of Health   Financial Resource Strain:   . Difficulty of Paying Living Expenses:   Food Insecurity:   . Worried About Programme researcher, broadcasting/film/video in the Last Year:   . Barista in the Last Year:   Transportation Needs:   . Freight forwarder (Medical):   Marland Kitchen Lack of Transportation (Non-Medical):   Physical Activity:   . Days of Exercise per Week:   . Minutes of Exercise per Session:   Stress:   . Feeling of Stress :   Social Connections:   . Frequency of Communication with Friends and Family:   . Frequency of Social Gatherings with Friends and Family:   . Attends Religious Services:   . Active Member of Clubs or Organizations:   . Attends Banker Meetings:   Marland Kitchen Marital Status:    Family History  Family history unknown: Yes   No Known Allergies Prior to Admission medications   Medication Sig Start Date End Date Taking? Authorizing Provider  acetaminophen (TYLENOL) 500 MG tablet Take 500 mg by mouth at bedtime.     [provider]  dicyclomine (BENTYL) 10 MG capsule Take 10 mg by mouth 3 (three) times daily before meals.     [provider]  feeding supplement, ENSURE ENLIVE, (ENSURE ENLIVE) LIQD Take 237 mLs by mouth 3 (three) times daily between meals. 03/27/18   Dustin Flock, MD  gabapentin (NEURONTIN) 100 MG capsule Take 100 mg by mouth daily.     [provider]  levothyroxine (SYNTHROID, LEVOTHROID) 88 MCG tablet Take 88 mcg by mouth daily.     [provider]  lisinopril (PRINIVIL,ZESTRIL) 5 MG tablet Take 5 mg by mouth daily.    [provider]  loperamide (IMODIUM) 2 MG capsule See admin instructions. Take 2 capsules (4MG ) by mouth after  first loose stool and 1 capsule (2MG ) by mouth after each subsequent loose stool - max 16MG  daily    [provider]  loratadine (CLARITIN) 10 MG tablet Take 10 mg by mouth daily.    [provider]  megestrol (MEGACE) 400 MG/10ML suspension Take 10 mLs (400 mg total) by mouth 2 (two) times daily. 03/27/18   Dustin Flock, MD  metFORMIN (GLUCOPHAGE) 500 MG tablet Take 500 mg by mouth 2 (two) times daily with a meal.     [provider]  mirtazapine (REMERON) 15 MG tablet Take 15 mg by mouth at bedtime.    [provider]  Multiple Vitamin (MULTIVITAMIN WITH MINERALS) TABS tablet Take 1 tablet by mouth daily. 03/27/18   Dustin Flock, MD  sertraline (ZOLOFT) 50 MG tablet Take 75 mg by mouth daily.     [provider]  tamsulosin (FLOMAX) 0.4 MG CAPS capsule Take 1 capsule (0.4 mg total) by mouth daily. 02/03/15   Zara Council A, PA-C  traMADol (ULTRAM) 50 MG tablet Take 1 tablet (50 mg total) by mouth every 6 (six) hours as needed. 01/30/18   Henreitta Leber, MD  traZODone (DESYREL) 50 MG tablet Take 50 mg by mouth at bedtime.    [provider]   DG Chest 1 View  Result Date: 07/20/2019 CLINICAL DATA:  Recent fall with known left hip fracture EXAM: CHEST  1 VIEW COMPARISON:  03/25/18 FINDINGS: Cardiac shadow is mildly enlarged. Lungs are well aerated bilaterally. No focal infiltrate or sizable effusion is seen. Lungs are clear bilaterally. Nipple shadows are noted bilaterally. No acute bony abnormality is seen. IMPRESSION: No acute abnormality noted. Electronically Signed   By: Inez Catalina M.D.   On: 07/20/2019 18:11   DG Hip Unilat W or Wo Pelvis 2-3 Views Left  Result Date: 07/20/2019 CLINICAL DATA:  Left hip pain following fall, initial encounter EXAM: DG HIP (WITH OR WITHOUT PELVIS) 3V LEFT COMPARISON:  None. FINDINGS: Subcapital left femoral neck fracture is noted with impaction and angulation at the fracture site. Pelvic ring is  otherwise intact. Right hip replacement is seen. Calcified uterine fibroids are noted. IMPRESSION: Left femoral neck fracture with impaction and angulation at the fracture site. Electronically Signed   By: Inez Catalina M.D.   On: 07/20/2019 18:09    Positive ROS: All other systems have been reviewed and were otherwise negative with the exception of those mentioned in the HPI and as above.  Physical Exam: General:  Alert, no acute distress Psychiatric:  Patient is not competent for consent  Cardiovascular:  No pedal edema Respiratory:  No wheezing, non-labored breathing GI:  Abdomen is soft and non-tender Skin:  No lesions in the area of chief complaint Neurologic:  Sensation intact distally Lymphatic:  No axillary or cervical lymphadenopathy  Orthopedic Exam:  Orthopedic examination is limited to the left hip and lower extremity.  The left lower extremity is  somewhat shortened and externally rotated as compared to the right.  Skin inspection around the left hip is unremarkable.  No swelling, erythema, ecchymosis, abrasions, or other skin abnormalities are identified.  She has pain/apprehension to palpation over the lateral aspect of the left hip.  She has more severe pain with any attempted active or passive motion of the left hip.  She has intact sensation to the left lower extremity and foot, demonstrating a withdrawal response to palpation of the foot.  She has good capillary refill to her left foot.  X-rays:  X-rays of the pelvis and left hip are available for review and have been reviewed by myself.  These films demonstrate a varus displaced left femoral neck fracture.  No significant degenerative changes are noted.  No lytic lesions or other acute bony abnormalities are identified.  Incidental note is made of a well-positioned right hip hemiarthroplasty which is properly located in the right acetabulum.  Assessment: Displaced left femoral neck fracture.  Plan: The treatment options  have been discussed with the patient's daughter, who is the patient's power of attorney, including both surgical and nonsurgical choices.  The patient's daughter would like to proceed with surgical intervention to include a left hip hemiarthroplasty.  This procedure has been discussed in detail with the patient's daughter, as have the potential risks (including bleeding, infection, nerve and/or blood vessel injury, persistent recurrent pain, dislocation, loosening of and/or failure of the components, leg length inequality, need for further surgery, blood clots, strokes, heart attacks and arrhythmias, etc.) and benefits.  The patient's daughter states her understanding wishes to proceed.  A formal written consent has been obtained.  Thank you for asking me to participate in the care of this most unfortunate woman.  I will be happy to follow her with you.   Maryagnes Amos, MD  Beeper #:  [phone]  07/20/2019 6:45 PM

## 2019-07-20 NOTE — Progress Notes (Signed)
Patient admitted to 9 with no personal belongings. Still wearing pajama pants from home. She is alert and curretnly not oriented. She gave a different last name than what appears on her hospital records. House supervisor notified of patient's arrival and need for consent as it cannot be obtained at this time. RN contacted [facility] caretaker for report. Per Tammy patient is on a chopped soft diet with sippy cup and meds taken whole however patient displayed difficulty swallowing. HS medications not administered. Patient is missing teeth and has bilateral hearing loss with a preference for the right ear. No skin issues noted. Patient unable to tolerate manipulation for the use of a bedpan so orders obtained for an external catheter from B. Jon Billings NP. Patient is calm and cooperative when not moving. Will continue to monitor.

## 2019-07-20 NOTE — ED Notes (Signed)
Tammy, caretaker at [facility] updated on pt's status [phone].

## 2019-07-20 NOTE — ED Notes (Signed)
Report given to Sonjia, RN 

## 2019-07-20 NOTE — ED Notes (Signed)
Attempted to call report. RN not available.

## 2019-07-20 NOTE — ED Provider Notes (Signed)
[facility] Emergency Department Provider Note   ____________________________________________   First MD Initiated Contact with Patient 07/20/19 1653     (approximate)  I have reviewed the triage vital signs and the nursing notes.   HISTORY  Chief Complaint Hip Pain  EM caveat, dementia, poor historian  HPI [name] is a 86 y.o. female history of Alzheimer's dementia, diabetes   Discussed with the patient's daughter, she was unaware that the patient was at the hospital.  But reports something very similar occurred when she broke her right hip a couple years ago  Per report, patient had a fall out of the wheelchair injuring her left hip.  Unwitnessed fall.  Patient denies any concerns other than pain in her left hip.  There is no report of any noted head injury.  Past Medical History:  Diagnosis Date  . Alzheimer's dementia (HCC)   . Cellulitis of left lower extremity   . Diabetes type 2, controlled (HCC)   . Hearing impairment   . Hypertension   . Hypothyroidism   . Unsteady gait   . Vision impairment     Patient Active Problem List   Diagnosis Date Noted  . Closed left hip fracture (HCC) 07/20/2019  . Pressure injury of skin 03/26/2018  . Acute renal failure (ARF) (HCC) 03/25/2018  . Protein-calorie malnutrition, severe 01/30/2018  . Hypothyroidism 01/27/2018  . HTN (hypertension) 01/27/2018  . Diabetes (HCC) 01/27/2018  . Pressure ulcer 04/12/2015  . Gangrene (HCC) 04/11/2015  . Urinary retention 02/04/2015  . Atelectasis 01/10/2015  . Acute encephalopathy 01/10/2015  . Dementia (HCC) 01/10/2015  . Closed right hip fracture (HCC) 01/07/2015    Past Surgical History:  Procedure Laterality Date  . HIP ARTHROPLASTY Right 01/08/2015   Procedure: ARTHROPLASTY BIPOLAR HIP (HEMIARTHROPLASTY);  Surgeon: [name], MD;  Location: [facility] ORS;  Service: Orthopedics;  Laterality: Right;  . IRRIGATION AND DEBRIDEMENT FOOT Left  04/12/2015   Procedure: IRRIGATION AND DEBRIDEMENT FOOT;  Surgeon: Linus Galas, MD;  Location: [facility] ORS;  Service: Podiatry;  Laterality: Left;  . NO PAST SURGERIES    . PERIPHERAL VASCULAR CATHETERIZATION Left 07/07/2015   Procedure: Lower Extremity Angiography;  Surgeon: Annice Needy, MD;  Location: [facility] INVASIVE CV LAB;  Service: Cardiovascular;  Laterality: Left;    Prior to Admission medications   Medication Sig Start Date End Date Taking? Authorizing Provider  acetaminophen (TYLENOL) 500 MG tablet Take 500 mg by mouth every 8 (eight) hours.    Yes [provider]  atorvastatin (LIPITOR) 20 MG tablet Take 20 mg by mouth daily.   Yes [provider]  feeding supplement, ENSURE ENLIVE, (ENSURE ENLIVE) LIQD Take 237 mLs by mouth 3 (three) times daily between meals. 03/27/18  Yes Auburn Bilberry, MD  gabapentin (NEURONTIN) 100 MG capsule Take 100 mg by mouth daily.    Yes [provider]  levothyroxine (SYNTHROID) 25 MCG tablet Take 25 mcg by mouth daily before breakfast.    Yes [provider]  mirtazapine (REMERON) 15 MG tablet Take 15 mg by mouth at bedtime.   Yes [provider]  sennosides-docusate sodium (SENOKOT-S) 8.6-50 MG tablet Take 2 tablets by mouth 2 (two) times daily.   Yes [provider]  traZODone (DESYREL) 50 MG tablet Take 50 mg by mouth at bedtime.   Yes [provider]    Allergies Patient has no known allergies.  Family History  Family history unknown: Yes    Social History Social History  Tobacco Use  . Smoking status: Never Smoker  . Smokeless tobacco: Never Used  Substance Use Topics  . Alcohol use: No  . Drug use: No    Review of Systems  EM caveat   ____________________________________________   PHYSICAL EXAM:  VITAL SIGNS: ED Triage Vitals  Enc Vitals Group     BP 07/20/19 1630 (!) 164/83     Pulse Rate 07/20/19 1630 93     Resp 07/20/19 1630 18     Temp 07/20/19 1630 99.7 F  (37.6 C)     Temp Source 07/20/19 1630 Oral     SpO2 07/20/19 1630 97 %     Weight 07/20/19 1632 110 lb (49.9 kg)     Height 07/20/19 1632 5\' 4"  (1.626 m)     Head Circumference --      Peak Flow --      Pain Score --      Pain Loc --      Pain Edu? --      Excl. in [place]? --     Constitutional: Alert and oriented to self but not to date or place.  Does recall falling reports injuring her left hip. Eyes: Conjunctivae are normal. Head: Atraumatic. Nose: No congestion/rhinnorhea. Mouth/Throat: Mucous membranes are moist. Neck: No stridor.  Cardiovascular: Normal rate, regular rhythm. Grossly normal heart sounds.  Good peripheral circulation. Respiratory: Normal respiratory effort.  No retractions. Lungs CTAB. Gastrointestinal: Soft and nontender. No distention. Musculoskeletal: Right lower extremity no acute abnormalities noted pain or discomfort.  Left lower extremity severe pain with any movement referred to the left hip region.  Pain over the greater trochanteric region.  No bleeding.  Strong palpable dorsalis pedis pulses and good toe wiggle both lower extremities.  No evidence of injury to the foot ankle knee or distal femur. Neurologic:  Normal speech and language she is somewhat flattened affect. No gross focal neurologic deficits are appreciated.  Skin:  Skin is warm, dry and intact. No rash noted. Psychiatric: Mood and affect are flat.   ____________________________________________   LABS (all labs ordered are listed, but only abnormal results are displayed)  Labs Reviewed  CBC - Abnormal; Notable for the following components:      Result Value   WBC 12.5 (*)    All other components within normal limits  BASIC METABOLIC PANEL - Abnormal; Notable for the following components:   Glucose, Bld 212 (*)    Creatinine, Ser 1.06 (*)    GFR calc non Af Amer 47 (*)    GFR calc Af Amer 54 (*)    All other components within normal limits  SARS CORONAVIRUS 2 (TAT 6-24 HRS)   PROTIME-INR  BASIC METABOLIC PANEL  CBC   ____________________________________________  EKG  Reviewed and interpreted by me at 1805 Heart rate 90 Normal QRS, QTC normal, normal sinus rhythm rate of 90, no evidence of acute ischemia ____________________________________________  RADIOLOGY  DG Chest 1 View  Result Date: 07/20/2019 CLINICAL DATA:  Recent fall with known left hip fracture EXAM: CHEST  1 VIEW COMPARISON:  03/25/18 FINDINGS: Cardiac shadow is mildly enlarged. Lungs are well aerated bilaterally. No focal infiltrate or sizable effusion is seen. Lungs are clear bilaterally. Nipple shadows are noted bilaterally. No acute bony abnormality is seen. IMPRESSION: No acute abnormality noted. Electronically Signed   By: Inez Catalina M.D.   On: 07/20/2019 18:11   DG Hip Unilat W or Wo Pelvis 2-3 Views Left  Result Date: 07/20/2019 CLINICAL DATA:  Left hip pain following fall, initial encounter EXAM: DG HIP (WITH OR WITHOUT PELVIS) 3V LEFT COMPARISON:  None. FINDINGS: Subcapital left femoral neck fracture is noted with impaction and angulation at the fracture site. Pelvic ring is otherwise intact. Right hip replacement is seen. Calcified uterine fibroids are noted. IMPRESSION: Left femoral neck fracture with impaction and angulation at the fracture site. Electronically Signed   By: Alcide Clever M.D.   On: 07/20/2019 18:09    Imaging studies reviewed most notable for left femoral neck fracture ____________________________________________   PROCEDURES  Procedure(s) performed: None  Procedures  Critical Care performed: No  ____________________________________________   INITIAL IMPRESSION / ASSESSMENT AND PLAN / ED COURSE  Pertinent labs & imaging results that were available during my care of the patient were reviewed by me and considered in my medical decision making (see chart for details).   Unwitnessed fall.  Obvious pain and discomfort left hip.  No signs or symptoms to  suggest head injury.  No bruising bleeding or hematomas.  Denies headache.  Imaging studies most notable and consistent with exam that demonstrates left hip fracture.  Case and care discussed with Dr. Joice Lofts who will see the patient in consult.  Patient will be admitted to the hospitalist service for further evaluation and preoperative evaluation as well  Clinical Course as of Jul 19 2029  Fri Jul 20, 2019  1835 Bed placement called, they will page hospitalist for admission at 7 PM.  Current evening hospitalist working with several other  admissions at this time   [MQ]  1836 Have spoken with the patient's daughter who is in agreement understanding of plan for admission and orthopedics consult.  Pain appears improved after additional fentanyl.  Resting comfortably   [MQ]    Clinical Course User Index [MQ] Sharyn Creamer, MD     ____________________________________________   FINAL CLINICAL IMPRESSION(S) / ED DIAGNOSES  Final diagnoses:  Closed left hip fracture, initial encounter [facility])        Note:  This document was prepared using Dragon voice recognition software and may include unintentional dictation errors       Sharyn Creamer, MD 07/20/19 2031

## 2019-07-20 NOTE — ED Triage Notes (Signed)
Pt arrives via [facility] from [facility] for staff reports of left hip pain after an unwitnessed fall. Per EMS pt was sitting in wheelchair when they arrived and are unsure of the details of the fall which staff reports occurred yesterday. Pt has hx of dementia and is a poor historian. Pt holding left hip at this time. No obvious deformity.

## 2019-07-20 NOTE — ED Notes (Signed)
Update given to [employer]

## 2019-07-21 ENCOUNTER — Inpatient Hospital Stay: Payer: Medicare Other

## 2019-07-21 ENCOUNTER — Encounter: Payer: Self-pay | Admitting: Family Medicine

## 2019-07-21 ENCOUNTER — Inpatient Hospital Stay: Payer: Medicare Other | Admitting: Anesthesiology

## 2019-07-21 ENCOUNTER — Encounter
Admission: EM | Disposition: A | Discharge status: Skilled Nursing Facility | Payer: Self-pay | Source: Skilled Nursing Facility | Attending: Internal Medicine

## 2019-07-21 DIAGNOSIS — I10 Essential (primary) hypertension: Secondary | ICD-10-CM

## 2019-07-21 DIAGNOSIS — E7849 Other hyperlipidemia: Secondary | ICD-10-CM

## 2019-07-21 DIAGNOSIS — S72002A Fracture of unspecified part of neck of left femur, initial encounter for closed fracture: Secondary | ICD-10-CM

## 2019-07-21 HISTORY — PX: HIP ARTHROPLASTY: SHX981

## 2019-07-21 LAB — SARS CORONAVIRUS 2 (TAT 6-24 HRS): SARS Coronavirus 2: NEGATIVE

## 2019-07-21 LAB — CBC
HCT: 32.2 % — ABNORMAL LOW (ref 36.0–46.0)
Hemoglobin: 10.8 g/dL — ABNORMAL LOW (ref 12.0–15.0)
MCH: 29.3 pg (ref 26.0–34.0)
MCHC: 33.5 g/dL (ref 30.0–36.0)
MCV: 87.3 fL (ref 80.0–100.0)
Platelets: 187 10*3/uL (ref 150–400)
RBC: 3.69 MIL/uL — ABNORMAL LOW (ref 3.87–5.11)
RDW: 12.9 % (ref 11.5–15.5)
WBC: 10.2 10*3/uL (ref 4.0–10.5)
nRBC: 0 % (ref 0.0–0.2)

## 2019-07-21 LAB — BASIC METABOLIC PANEL
Anion gap: 5 (ref 5–15)
BUN: 25 mg/dL — ABNORMAL HIGH (ref 8–23)
CO2: 30 mmol/L (ref 22–32)
Calcium: 8.8 mg/dL — ABNORMAL LOW (ref 8.9–10.3)
Chloride: 103 mmol/L (ref 98–111)
Creatinine, Ser: 1 mg/dL (ref 0.44–1.00)
GFR calc Af Amer: 58 mL/min — ABNORMAL LOW (ref >60)
GFR calc non Af Amer: 50 mL/min — ABNORMAL LOW (ref >60)
Glucose, Bld: 197 mg/dL — ABNORMAL HIGH (ref 70–99)
Potassium: 4.3 mmol/L (ref 3.5–5.1)
Sodium: 138 mmol/L (ref 135–145)

## 2019-07-21 LAB — GLUCOSE, CAPILLARY
Glucose-Capillary: 169 mg/dL — ABNORMAL HIGH (ref 70–99)
Glucose-Capillary: 162 mg/dL — ABNORMAL HIGH (ref 70–99)
Glucose-Capillary: 276 mg/dL — ABNORMAL HIGH (ref 70–99)
Glucose-Capillary: 246 mg/dL — ABNORMAL HIGH (ref 70–99)

## 2019-07-21 LAB — SURGICAL PCR SCREEN
MRSA, PCR: NEGATIVE
Staphylococcus aureus: NEGATIVE

## 2019-07-21 SURGERY — HEMIARTHROPLASTY, HIP, DIRECT ANTERIOR APPROACH, FOR FRACTURE
Anesthesia: Spinal | Site: Hip | Laterality: Left

## 2019-07-21 MED ORDER — SODIUM CHLORIDE FLUSH 0.9 % IV SOLN
INTRAVENOUS | Status: AC
  Filled 2019-07-21: qty 40

## 2019-07-21 MED ORDER — ONDANSETRON HCL 4 MG/2ML IJ SOLN: 4.0000 mg | Freq: Four times a day (QID) | INTRAMUSCULAR | Status: DC | PRN

## 2019-07-21 MED ORDER — PROPOFOL 500 MG/50ML IV EMUL
INTRAVENOUS | Status: AC
  Filled 2019-07-21: qty 50

## 2019-07-21 MED ORDER — ACETAMINOPHEN 500 MG PO TABS
1000.0000 mg | ORAL_TABLET | Freq: Four times a day (QID) | ORAL | Status: AC
  Administered 2019-07-21: 1000 mg via ORAL
  Filled 2019-07-21 (×2): qty 2

## 2019-07-21 MED ORDER — CEFAZOLIN SODIUM 1 G IJ SOLR
INTRAMUSCULAR | Status: AC
  Filled 2019-07-21: qty 10

## 2019-07-21 MED ORDER — ONDANSETRON HCL 4 MG PO TABS: 4.0000 mg | ORAL_TABLET | Freq: Four times a day (QID) | ORAL | Status: DC | PRN

## 2019-07-21 MED ORDER — SUCCINYLCHOLINE CHLORIDE 20 MG/ML IJ SOLN
INTRAMUSCULAR | Status: AC
  Filled 2019-07-21: qty 1

## 2019-07-21 MED ORDER — BUPIVACAINE HCL (PF) 0.5 % IJ SOLN
INTRAMUSCULAR | Status: DC | PRN
  Administered 2019-07-21: 2.2 mL

## 2019-07-21 MED ORDER — PROPOFOL 500 MG/50ML IV EMUL
INTRAVENOUS | Status: DC | PRN
  Administered 2019-07-21: 15 ug/kg/min via INTRAVENOUS

## 2019-07-21 MED ORDER — METOCLOPRAMIDE HCL 10 MG PO TABS: 5.0000 mg | ORAL_TABLET | Freq: Three times a day (TID) | ORAL | Status: DC | PRN

## 2019-07-21 MED ORDER — FENTANYL CITRATE (PF) 100 MCG/2ML IJ SOLN: 25.0000 ug | INTRAMUSCULAR | Status: DC | PRN

## 2019-07-21 MED ORDER — MAGNESIUM HYDROXIDE 400 MG/5ML PO SUSP: 30.0000 mL | Freq: Every day | ORAL | Status: DC | PRN

## 2019-07-21 MED ORDER — OXYCODONE HCL 5 MG PO TABS: 5.0000 mg | ORAL_TABLET | ORAL | Status: DC | PRN

## 2019-07-21 MED ORDER — METOCLOPRAMIDE HCL 5 MG/ML IJ SOLN: 5.0000 mg | Freq: Three times a day (TID) | INTRAMUSCULAR | Status: DC | PRN

## 2019-07-21 MED ORDER — BUPIVACAINE HCL (PF) 0.5 % IJ SOLN
INTRAMUSCULAR | Status: AC
  Filled 2019-07-21: qty 30

## 2019-07-21 MED ORDER — DIPHENHYDRAMINE HCL 12.5 MG/5ML PO ELIX: 12.5000 mg | ORAL_SOLUTION | ORAL | Status: DC | PRN

## 2019-07-21 MED ORDER — BISACODYL 10 MG RE SUPP
10.0000 mg | Freq: Every day | RECTAL | Status: DC | PRN
  Administered 2019-07-24: 10 mg via RECTAL
  Filled 2019-07-21: qty 1

## 2019-07-21 MED ORDER — FLEET ENEMA 7-19 GM/118ML RE ENEM: 1.0000 | ENEMA | Freq: Once | RECTAL | Status: DC | PRN

## 2019-07-21 MED ORDER — PHENYLEPHRINE HCL (PRESSORS) 10 MG/ML IV SOLN
INTRAVENOUS | Status: AC
  Filled 2019-07-21: qty 1

## 2019-07-21 MED ORDER — CHLORHEXIDINE GLUCONATE CLOTH 2 % EX PADS
6.0000 | MEDICATED_PAD | Freq: Every day | CUTANEOUS | Status: DC
  Administered 2019-07-21 – 2019-07-24 (×4): 6 via TOPICAL

## 2019-07-21 MED ORDER — DOCUSATE SODIUM 100 MG PO CAPS
100.0000 mg | ORAL_CAPSULE | Freq: Two times a day (BID) | ORAL | Status: DC
  Administered 2019-07-22: 100 mg via ORAL
  Filled 2019-07-21 (×5): qty 1

## 2019-07-21 MED ORDER — BUPIVACAINE-EPINEPHRINE (PF) 0.5% -1:200000 IJ SOLN
INTRAMUSCULAR | Status: DC | PRN
  Administered 2019-07-21: 30 mL via PERINEURAL

## 2019-07-21 MED ORDER — BUPIVACAINE LIPOSOME 1.3 % IJ SUSP
INTRAMUSCULAR | Status: AC
  Filled 2019-07-21: qty 20

## 2019-07-21 MED ORDER — TRAMADOL HCL 50 MG PO TABS
50.0000 mg | ORAL_TABLET | Freq: Four times a day (QID) | ORAL | Status: DC | PRN
  Administered 2019-07-24: 50 mg via ORAL
  Filled 2019-07-21: qty 1

## 2019-07-21 MED ORDER — PROPOFOL 10 MG/ML IV BOLUS
INTRAVENOUS | Status: AC
  Filled 2019-07-21: qty 20

## 2019-07-21 MED ORDER — ENOXAPARIN SODIUM 30 MG/0.3ML ~~LOC~~ SOLN
30.0000 mg | SUBCUTANEOUS | Status: DC
  Administered 2019-07-22 – 2019-07-25 (×4): 30 mg via SUBCUTANEOUS
  Filled 2019-07-21 (×4): qty 0.3

## 2019-07-21 MED ORDER — BUPIVACAINE LIPOSOME 1.3 % IJ SUSP
INTRAMUSCULAR | Status: DC | PRN
  Administered 2019-07-21 (×2): 20 mL

## 2019-07-21 MED ORDER — TRANEXAMIC ACID 1000 MG/10ML IV SOLN
INTRAVENOUS | Status: DC | PRN
  Administered 2019-07-21: 1000 mg via INTRAVENOUS

## 2019-07-21 MED ORDER — CEFAZOLIN SODIUM-DEXTROSE 2-4 GM/100ML-% IV SOLN
2.0000 g | Freq: Four times a day (QID) | INTRAVENOUS | Status: AC
  Administered 2019-07-21 – 2019-07-22 (×3): 2 g via INTRAVENOUS
  Filled 2019-07-21 (×4): qty 100

## 2019-07-21 MED ORDER — LACTATED RINGERS IV SOLN: INTRAVENOUS | Status: DC | PRN

## 2019-07-21 MED ORDER — ONDANSETRON HCL 4 MG/2ML IJ SOLN
INTRAMUSCULAR | Status: DC | PRN
  Administered 2019-07-21: 4 mg via INTRAVENOUS

## 2019-07-21 MED ORDER — INSULIN ASPART 100 UNIT/ML ~~LOC~~ SOLN
0.0000 [IU] | Freq: Three times a day (TID) | SUBCUTANEOUS | Status: DC
  Administered 2019-07-21: 5 [IU] via SUBCUTANEOUS
  Administered 2019-07-21 – 2019-07-22 (×2): 2 [IU] via SUBCUTANEOUS
  Administered 2019-07-22: 5 [IU] via SUBCUTANEOUS
  Administered 2019-07-22 – 2019-07-23 (×2): 3 [IU] via SUBCUTANEOUS
  Administered 2019-07-23: 2 [IU] via SUBCUTANEOUS
  Administered 2019-07-24 (×2): 3 [IU] via SUBCUTANEOUS
  Administered 2019-07-24 – 2019-07-25 (×3): 2 [IU] via SUBCUTANEOUS
  Filled 2019-07-21 (×12): qty 1

## 2019-07-21 MED ORDER — TRANEXAMIC ACID 1000 MG/10ML IV SOLN
INTRAVENOUS | Status: AC
  Filled 2019-07-21: qty 10

## 2019-07-21 MED ORDER — LIDOCAINE HCL (PF) 1 % IJ SOLN
INTRAMUSCULAR | Status: DC | PRN
  Administered 2019-07-21: 1 mL

## 2019-07-21 MED ORDER — EPINEPHRINE PF 1 MG/ML IJ SOLN
INTRAMUSCULAR | Status: AC
  Filled 2019-07-21: qty 1

## 2019-07-21 MED ORDER — PROPOFOL 10 MG/ML IV BOLUS
INTRAVENOUS | Status: DC | PRN
  Administered 2019-07-21 (×2): 10 mg via INTRAVENOUS

## 2019-07-21 MED ORDER — ONDANSETRON HCL 4 MG/2ML IJ SOLN
INTRAMUSCULAR | Status: AC
  Filled 2019-07-21: qty 2

## 2019-07-21 SURGICAL SUPPLY — 66 items
BAG DECANTER FOR FLEXI CONT (MISCELLANEOUS) IMPLANT
BLADE SAGITTAL WIDE XTHICK NO (BLADE) ×3 IMPLANT
BLADE SURG SZ20 CARB STEEL (BLADE) ×3 IMPLANT
BNDG COHESIVE 6X5 TAN STRL LF (GAUZE/BANDAGES/DRESSINGS) ×3 IMPLANT
BOWL CEMENT MIXING ADV NOZZLE (MISCELLANEOUS) IMPLANT
CANISTER SUCT 1200ML W/VALVE (MISCELLANEOUS) ×3 IMPLANT
CANISTER SUCT 3000ML PPV (MISCELLANEOUS) ×6 IMPLANT
CHLORAPREP W/TINT 26 (MISCELLANEOUS) ×6 IMPLANT
COVER WAND RF STERILE (DRAPES) ×3 IMPLANT
DECANTER SPIKE VIAL GLASS SM (MISCELLANEOUS) ×6 IMPLANT
DRAPE 3/4 80X56 (DRAPES) ×3 IMPLANT
DRAPE INCISE IOBAN 66X60 STRL (DRAPES) ×3 IMPLANT
DRAPE SPLIT 6X30 W/TAPE (DRAPES) ×6 IMPLANT
DRAPE SURG 17X11 SM STRL (DRAPES) ×3 IMPLANT
DRAPE SURG 17X23 STRL (DRAPES) ×3 IMPLANT
DRILL BIT 2.0 (BIT) ×2 IMPLANT
DRSG OPSITE POSTOP 4X12 (GAUZE/BANDAGES/DRESSINGS) ×3 IMPLANT
DRSG OPSITE POSTOP 4X14 (GAUZE/BANDAGES/DRESSINGS) IMPLANT
DRSG OPSITE POSTOP 4X8 (GAUZE/BANDAGES/DRESSINGS) ×3 IMPLANT
ELECT BLADE 6.5 EXT (BLADE) ×3 IMPLANT
ELECT CAUTERY BLADE 6.4 (BLADE) ×3 IMPLANT
ELECT REM PT RETURN 9FT ADLT (ELECTROSURGICAL) ×3
ELECTRODE REM PT RTRN 9FT ADLT (ELECTROSURGICAL) ×1 IMPLANT
GAUZE PACK 2X3YD (GAUZE/BANDAGES/DRESSINGS) IMPLANT
GLOVE BIO SURGEON STRL SZ8 (GLOVE) ×6 IMPLANT
GLOVE BIOGEL M STRL SZ7.5 (GLOVE) IMPLANT
GLOVE BIOGEL PI IND STRL 8 (GLOVE) IMPLANT
GLOVE BIOGEL PI INDICATOR 8 (GLOVE)
GLOVE INDICATOR 8.0 STRL GRN (GLOVE) ×3 IMPLANT
GOWN STRL REUS W/ TWL LRG LVL3 (GOWN DISPOSABLE) ×1 IMPLANT
GOWN STRL REUS W/ TWL XL LVL3 (GOWN DISPOSABLE) ×1 IMPLANT
GOWN STRL REUS W/TWL LRG LVL3 (GOWN DISPOSABLE) ×2
GOWN STRL REUS W/TWL XL LVL3 (GOWN DISPOSABLE) ×2
HEAD ENDO II MOD SZ 44 (Orthopedic Implant) ×2 IMPLANT
HOOD PEEL AWAY FLYTE STAYCOOL (MISCELLANEOUS) ×6 IMPLANT
INSERT TAPER ENDO II -6 (Orthopedic Implant) ×2 IMPLANT
IV NS 100ML SINGLE PACK (IV SOLUTION) IMPLANT
LABEL OR SOLS (LABEL) ×3 IMPLANT
NDL FILTER BLUNT 18X1 1/2 (NEEDLE) ×1 IMPLANT
NDL SAFETY ECLIPSE 18X1.5 (NEEDLE) ×1 IMPLANT
NDL SPNL 20GX3.5 QUINCKE YW (NEEDLE) ×1 IMPLANT
NEEDLE FILTER BLUNT 18X 1/2SAF (NEEDLE) ×2
NEEDLE FILTER BLUNT 18X1 1/2 (NEEDLE) ×1 IMPLANT
NEEDLE HYPO 18GX1.5 SHARP (NEEDLE) ×2
NEEDLE SPNL 20GX3.5 QUINCKE YW (NEEDLE) ×3 IMPLANT
NS IRRIG 1000ML POUR BTL (IV SOLUTION) ×3 IMPLANT
PACK HIP PROSTHESIS (MISCELLANEOUS) ×3 IMPLANT
PULSAVAC PLUS IRRIG FAN TIP (DISPOSABLE) ×3
SOL .9 NS 3000ML IRR  AL (IV SOLUTION) ×4
SOL .9 NS 3000ML IRR UROMATIC (IV SOLUTION) ×2 IMPLANT
STAPLER SKIN PROX 35W (STAPLE) ×3 IMPLANT
STEM COLLARLESS RED 13X145MM (Stem) ×2 IMPLANT
STRAP SAFETY 5IN WIDE (MISCELLANEOUS) ×3 IMPLANT
SUT ETHIBOND 2 V 37 (SUTURE) ×3 IMPLANT
SUT VIC AB 1 CT1 36 (SUTURE) IMPLANT
SUT VIC AB 2-0 CT1 (SUTURE) ×6 IMPLANT
SUT VIC AB 2-0 CT1 27 (SUTURE)
SUT VIC AB 2-0 CT1 TAPERPNT 27 (SUTURE) IMPLANT
SUT VICRYL 1-0 27IN ABS (SUTURE) ×6
SUTURE VICRYL 1-0 27IN ABS (SUTURE) ×2 IMPLANT
SYR 10ML LL (SYRINGE) ×3 IMPLANT
SYR 30ML LL (SYRINGE) ×9 IMPLANT
SYR TB 1ML 27GX1/2 LL (SYRINGE) IMPLANT
TAPE TRANSPORE STRL 2 31045 (GAUZE/BANDAGES/DRESSINGS) ×3 IMPLANT
TIP BRUSH PULSAVAC PLUS 24.33 (MISCELLANEOUS) ×3 IMPLANT
TIP FAN IRRIG PULSAVAC PLUS (DISPOSABLE) ×1 IMPLANT

## 2019-07-21 NOTE — Op Note (Signed)
07/20/2019 - 07/21/2019  10:03 AM  Patient:   [name]  Pre-Op Diagnosis:   Displaced femoral neck fracture, left hip.  Post-Op Diagnosis:   Same.  Procedure:   Left hip unipolar hemiarthroplasty.  Surgeon:   Maryagnes Amos, MD  Assistant:   Horris Latino, PA-C  Anesthesia:   Spinal  Findings:   As above.  Complications:   None  EBL:   25 cc  Fluids:   1000 cc crystalloid  UOP:   600 cc  TT:   None  Drains:   None  Closure:   Staples  Implants:   Biomet press-fit system with a #13 standard offset reduced proximal profile Echo femoral stem, a 44 mm outer diameter shell, and a -6 mm neck adapter.  Brief Clinical Note:   The patient is an 86 year old female who sustained above-noted injury 2 nights ago. Apparently she had an unwitnessed fall. She was brought to the emergency room last evening where x-rays demonstrated the above-noted injury. The patient has been cleared medically and presents at this time for definitive management of the injury.  Procedure:   The patient was brought into the operating room. After adequate spinal anesthesia was obtained, the patient was repositioned in the right lateral decubitus position and secured using a lateral hip positioner. The left hip and lower extremity were prepped with ChloroPrep solution before being draped sterilely. Preoperative antibiotics were administered. A timeout was performed to verify the appropriate surgical site before a standard posterior approach to the hip was made through an approximately 4-5 inch incision. The incision was carried down through the subcutaneous tissues to expose the gluteal fascia and proximal end of the iliotibial band. These structures were split the length of the incision and the Charnley self-retaining hip retractor placed. The bursal tissues were swept posteriorly to expose the short external rotators. The anterior border of the piriformis tendon was identified and this plane developed down  through the capsule to enter the joint. Abundant fracture hematoma was suctioned. A flap of tissue was elevated off the posterior aspect of the femoral neck and greater trochanter and retracted posteriorly. This flap included the piriformis tendon, the short external rotators, and the posterior capsule. The femoral head was removed in its entirety, then taken to the back table where it was measured and found to be optimally replicated by a 44 mm head. The appropriate trial head was inserted and found to demonstrate an excellent suction fit.   Attention was directed to the femoral side. The femoral neck was recut 10-12 mm above the lesser trochanter using an oscillating saw. The piriformis fossa was debrided of soft tissues before the intramedullary canal was accessed through this point using a triple step reamer. The canal was reamed sequentially beginning with a #7 tapered reamer and progressing to a #13 tapered reamer. This provided excellent circumferential chatter. A box osteotome was used to establish version before the canal was broached sequentially beginning with a #11 broach and progressing to a #13 broach. This was left in place and several trial reductions performed. The permanent #13 reduced proximal profile femoral stem was impacted into place. A repeat trial reduction was performed using the -6 mm neck length. The -6 mm neck length demonstrated excellent stability both in extension and external rotation as well as with flexion to 90 and internal rotation beyond 70. It also was stable in the position of sleep. The 44 mm outer diameter shell with the -6 mm neck adapter construct was put  together on the back table before being impacted onto the stem of the femoral component. The Morse taper locking mechanism was verified using manual distraction before the head was relocated and the hip placed through a range of motion with the findings as described above.  The wound was copiously irrigated with  bacitracin saline solution via the jet lavage system before the peri-incisional and pericapsular tissues were injected with 30 cc of 0.5% Sensorcaine with epinephrine and 20 cc of Exparel diluted out to 60 cc with normal saline to help with postoperative analgesia. The posterior flap was reapproximated to the posterior aspect of the greater trochanter using #2 Tycron interrupted sutures placed through drill holes. Several additional #2 Tycron interrupted sutures were used to reinforce this layer of closure. The iliotibial band was reapproximated using #1 Vicryl interrupted sutures before the gluteal fascia was closed using a running #1 Vicryl suture. At this point, 1 g of transexemic acid in 10 cc of normal saline was injected into the joint to help reduce postoperative bleeding. The subcutaneous tissues were closed in several layers using 2-0 Vicryl interrupted sutures before the skin was closed using staples. A sterile occlusive dressing was applied to the wound before the patient was placed into an abduction wedge pillow. The patient was then rolled back into the supine position on the hospital bed before being awakened and returned to the recovery room in satisfactory condition after tolerating the procedure well.

## 2019-07-21 NOTE — Anesthesia Preprocedure Evaluation (Addendum)
Anesthesia Evaluation  Patient identified by MRN, date of birth, ID band Patient awake    Reviewed: Allergy & Precautions, H&P , NPO status , Patient's Chart, lab work & pertinent test results  Airway Mallampati: II  TM Distance: <3 FB     Dental  (+) Missing, Chipped   Pulmonary neg pulmonary ROS,    breath sounds clear to auscultation       Cardiovascular hypertension,  Rhythm:regular Rate:Normal     Neuro/Psych PSYCHIATRIC DISORDERS Dementia negative neurological ROS     GI/Hepatic negative GI ROS, Neg liver ROS,   Endo/Other  diabetesHypothyroidism   Renal/GU ARFRenal disease     Musculoskeletal   Abdominal   Peds  Hematology negative hematology ROS (+)   Anesthesia Other Findings Past Medical History: No date: Alzheimer's dementia (HCC) No date: Cellulitis of left lower extremity No date: Diabetes type 2, controlled (HCC) No date: Hearing impairment No date: Hypertension No date: Hypothyroidism No date: Unsteady gait No date: Vision impairment  Past Surgical History: 01/08/2015: HIP ARTHROPLASTY; Right     Comment:  Procedure: ARTHROPLASTY BIPOLAR HIP (HEMIARTHROPLASTY);               Surgeon: [name], MD;  Location: [facility] ORS;                Service: Orthopedics;  Laterality: Right; 04/12/2015: IRRIGATION AND DEBRIDEMENT FOOT; Left     Comment:  Procedure: IRRIGATION AND DEBRIDEMENT FOOT;  Surgeon:               Linus Galas, MD;  Location: [facility] ORS;  Service: Podiatry;               Laterality: Left; No date: NO PAST SURGERIES 07/07/2015: PERIPHERAL VASCULAR CATHETERIZATION; Left     Comment:  Procedure: Lower Extremity Angiography;  Surgeon: Annice Needy, MD;  Location: [facility] INVASIVE CV LAB;  Service:               Cardiovascular;  Laterality: Left;  BMI    Body Mass Index: 22.46 kg/m      Reproductive/Obstetrics negative OB ROS                             Anesthesia Physical Anesthesia Plan  ASA: II  Anesthesia Plan: Spinal   Post-op Pain Management:    Induction:   PONV Risk Score and Plan: Propofol infusion  Airway Management Planned:   Additional Equipment:   Intra-op Plan:   Post-operative Plan:   Informed Consent: I have reviewed the patients History and Physical, chart, labs and discussed the procedure including the risks, benefits and alternatives for the proposed anesthesia with the patient or authorized representative who has indicated his/her understanding and acceptance.     Dental Advisory Given  Plan Discussed with: Anesthesiologist  Anesthesia Plan Comments: (Phone consent for anesthesia obtained from daughter last night by Dr. Henrene Hawking.  )       Anesthesia Quick Evaluation

## 2019-07-21 NOTE — Progress Notes (Signed)
PROGRESS NOTE    [name]  QJJ:[id number] DOB: 01-Nov-1927 DOA: 07/20/2019 PCP: Clemetine Marker, FNP      Assessment & Plan:   Active Problems:   Closed left hip fracture (HCC)   Left hip fracture: like secondary to unwitnessed fall. S/p left hip unipolar hemiarthroplasty. Morphine, percocet prn for pain. Zofran prn for nausea/vomiting. Ortho surg following and recs apprec   Hypertensive urgency: resolved. Continue on IV labetalol & hydralazine prn  Dyslipidemia: will restart statin after surgery  Hypothyroidism: will continue on home dose of synthroid.  DM2: will start SSI w/ accuchecks. Carb modified diet    DVT prophylaxis: lovenox  Code Status: full  Family Communication: discussed pt's care w/ pt's daughter, [name], and answered her questions Disposition Plan: likely will need to go to SNF but PT/OT will have to evaluate first    Consultants:   Ortho surgery    Procedures:   Antimicrobials:    Subjective: Pt did not answer any of my questions but would follow simple commands.  Objective: Vitals:   07/21/19 1057 07/21/19 1114 07/21/19 1149 07/21/19 1253  BP: (!) 145/73 (!) 160/73 (!) 144/103 (!) 138/99  Pulse: 81 78 77 80  Resp: 18 18 18 18   Temp:  97.9 F (36.6 C) 99 F (37.2 C) 98.9 F (37.2 C)  TempSrc:  Oral Oral Oral  SpO2: 95% 96% 96% 96%  Weight:      Height:        Intake/Output Summary (Last 24 hours) at 07/21/2019 1330 Last data filed at 07/21/2019 1048 Gross per 24 hour  Intake 1030.41 ml  Output 625 ml  Net 405.41 ml   Filed Weights   07/20/19 1632 07/20/19 2228  Weight: 49.9 kg 52.2 kg    Examination:  General exam: Appears calm and comfortable. Frail appearing   Respiratory system: diminished breath sounds b/l. No rales Cardiovascular system: S1 & S2+. No rubs, gallops or clicks.  Gastrointestinal system: Abdomen is nondistended, soft and nontender.  Hypoactive bowel sounds heard. Central nervous system:  Alert and awake. Moves all 4 extremities  Psychiatry: Flat mood and affect.     Data Reviewed: I have personally reviewed following labs and imaging studies  CBC: Recent Labs  Lab 07/20/19 1817 07/21/19 0453  WBC 12.5* 10.2  HGB 12.1 10.8*  HCT 36.8 32.2*  MCV 88.2 87.3  PLT 204 187   Basic Metabolic Panel: Recent Labs  Lab 07/20/19 1817 07/21/19 0453  NA 136 138  K 4.5 4.3  CL 101 103  CO2 23 30  GLUCOSE 212* 197*  BUN 21 25*  CREATININE 1.06* 1.00  CALCIUM 9.1 8.8*   GFR: Estimated Creatinine Clearance: 27.4 mL/min (by C-G formula based on SCr of 1 mg/dL). Liver Function Tests: No results for input(s): AST, ALT, ALKPHOS, BILITOT, PROT, ALBUMIN in the last 168 hours. No results for input(s): LIPASE, AMYLASE in the last 168 hours. No results for input(s): AMMONIA in the last 168 hours. Coagulation Profile: Recent Labs  Lab 07/20/19 1817  INR 0.9   Cardiac Enzymes: No results for input(s): CKTOTAL, CKMB, CKMBINDEX, TROPONINI in the last 168 hours. BNP (last 3 results) No results for input(s): PROBNP in the last 8760 hours. HbA1C: No results for input(s): HGBA1C in the last 72 hours. CBG: Recent Labs  Lab 07/21/19 1031 07/21/19 1133  GLUCAP 169* 162*   Lipid Profile: No results for input(s): CHOL, HDL, LDLCALC, TRIG, CHOLHDL, LDLDIRECT in the last 72 hours. Thyroid Function Tests: No results  for input(s): TSH, T4TOTAL, FREET4, T3FREE, THYROIDAB in the last 72 hours. Anemia Panel: No results for input(s): VITAMINB12, FOLATE, FERRITIN, TIBC, IRON, RETICCTPCT in the last 72 hours. Sepsis Labs: No results for input(s): PROCALCITON, LATICACIDVEN in the last 168 hours.  Recent Results (from the past 240 hour(s))  SARS CORONAVIRUS 2 (TAT 6-24 HRS) Nasopharyngeal Nasopharyngeal Swab     Status: None   Collection Time: 07/20/19  6:52 PM   Specimen: Nasopharyngeal Swab  Result Value Ref Range Status   SARS Coronavirus 2 NEGATIVE NEGATIVE Final    Comment:  (NOTE) SARS-CoV-2 target nucleic acids are NOT DETECTED. The SARS-CoV-2 RNA is generally detectable in upper and lower respiratory specimens during the acute phase of infection. Negative results do not preclude SARS-CoV-2 infection, do not rule out co-infections with other pathogens, and should not be used as the sole basis for treatment or other patient management decisions. Negative results must be combined with clinical observations, patient history, and epidemiological information. The expected result is Negative. Fact Sheet for Patients: HairSlick.no Fact Sheet for Healthcare Providers: quierodirigir.com This test is not yet approved or cleared by the [place] FDA and  has been authorized for detection and/or diagnosis of SARS-CoV-2 by FDA under an Emergency Use Authorization (EUA). This EUA will remain  in effect (meaning this test can be used) for the duration of the COVID-19 declaration under Section 56 4(b)(1) of the Act, 21 U.S.C. section 360bbb-3(b)(1), unless the authorization is terminated or revoked sooner. Performed at [facility], [address], [place], [place] [address]   Surgical PCR screen     Status: None   Collection Time: 07/21/19  6:24 AM   Specimen: Nasal Mucosa; Nasal Swab  Result Value Ref Range Status   MRSA, PCR NEGATIVE NEGATIVE Final   Staphylococcus aureus NEGATIVE NEGATIVE Final    Comment: (NOTE) The Xpert SA Assay (FDA approved for NASAL specimens in patients 31 years of age and older), is one component of a comprehensive surveillance program. It is not intended to diagnose infection nor to guide or monitor treatment. Performed at [facility], [address], [place], [place] [address]          Radiology Studies: DG Chest 1 View  Result Date: 07/20/2019 CLINICAL DATA:  Recent fall with known left hip fracture EXAM: CHEST  1 VIEW COMPARISON:  03/25/18 FINDINGS:  Cardiac shadow is mildly enlarged. Lungs are well aerated bilaterally. No focal infiltrate or sizable effusion is seen. Lungs are clear bilaterally. Nipple shadows are noted bilaterally. No acute bony abnormality is seen. IMPRESSION: No acute abnormality noted. Electronically Signed   By: Alcide Clever M.D.   On: 07/20/2019 18:11   DG HIP UNILAT W OR W/O PELVIS 2-3 VIEWS LEFT  Result Date: 07/21/2019 CLINICAL DATA:  Status post left hemiarthroplasty EXAM: DG HIP (WITH OR WITHOUT PELVIS) 2-3V LEFT COMPARISON:  07/20/2019 FINDINGS: New left hemiarthroplasty appears well-seated and well aligned. No acute fracture or evidence of an operative complication. IMPRESSION: Well-positioned left hip hemiarthroplasty. Electronically Signed   By: Amie Portland M.D.   On: 07/21/2019 12:36   DG Hip Unilat W or Wo Pelvis 2-3 Views Left  Result Date: 07/20/2019 CLINICAL DATA:  Left hip pain following fall, initial encounter EXAM: DG HIP (WITH OR WITHOUT PELVIS) 3V LEFT COMPARISON:  None. FINDINGS: Subcapital left femoral neck fracture is noted with impaction and angulation at the fracture site. Pelvic ring is otherwise intact. Right hip replacement is seen. Calcified uterine fibroids are  noted. IMPRESSION: Left femoral neck fracture with impaction and angulation at the fracture site. Electronically Signed   By: Inez Catalina M.D.   On: 07/20/2019 18:09        Scheduled Meds: . acetaminophen  1,000 mg Oral Q6H  . atorvastatin  20 mg Oral Daily  . Chlorhexidine Gluconate Cloth  6 each Topical Daily  . docusate sodium  100 mg Oral BID  . [START ON 07/22/2019] enoxaparin (LOVENOX) injection  30 mg Subcutaneous Q24H  . feeding supplement (ENSURE ENLIVE)  237 mL Oral TID BM  . gabapentin  100 mg Oral Daily  . insulin aspart  0-9 Units Subcutaneous TID WC  . levothyroxine  25 mcg Oral Q0600  . mirtazapine  15 mg Oral QHS  . senna-docusate  2 tablet Oral BID  . traZODone  50 mg Oral QHS   Continuous Infusions: .  sodium chloride 75 mL/hr at 07/21/19 1328  .  ceFAZolin (ANCEF) IV 2 g (07/21/19 1328)     LOS: 1 day    Time spent: 30 mins    Wyvonnia Dusky, MD [facility] Pager [phone]  If 7PM-7AM, please contact night-coverage [email or web address] 07/21/2019, 1:30 PM

## 2019-07-21 NOTE — Transfer of Care (Signed)
Immediate Anesthesia Transfer of Care Note  Patient: [name]  Procedure(s) Performed: ARTHROPLASTY BIPOLAR HIP (HEMIARTHROPLASTY) (Left Hip)  Patient Location: PACU  Anesthesia Type:Spinal  Level of Consciousness: sedated  Airway & Oxygen Therapy: Patient Spontanous Breathing and Patient connected to face mask oxygen  Post-op Assessment: Report given to RN and Post -op Vital signs reviewed and stable  Post vital signs: Reviewed and stable  Last Vitals:  Vitals Value Taken Time  BP 173/81 07/21/19 1015  Temp 37.3 C 07/21/19 1014  Pulse 78 07/21/19 1023  Resp 20 07/21/19 1023  SpO2 97 % 07/21/19 1023  Vitals shown include unvalidated device data.  Last Pain:  Vitals:   07/21/19 0733  TempSrc: Oral         Complications: No apparent anesthesia complications

## 2019-07-21 NOTE — H&P (Signed)
H&P/consent reviewed and patient re-examined. No changes.

## 2019-07-21 NOTE — Plan of Care (Signed)
Care plan initiated. Will monitor progress throughout hospitalization.

## 2019-07-21 NOTE — Anesthesia Procedure Notes (Addendum)
Spinal  Patient location during procedure: OR Start time: 07/21/2019 8:24 AM End time: 07/21/2019 8:27 AM Staffing Performed: anesthesiologist  Anesthesiologist: Tera Mater, MD Preanesthetic Checklist Completed: patient identified, IV checked, site marked, risks and benefits discussed, surgical consent, monitors and equipment checked, pre-op evaluation and timeout performed Spinal Block Patient position: sitting Prep: ChloraPrep Patient monitoring: heart rate, continuous pulse ox, blood pressure and cardiac monitor Approach: midline Location: L4-5 Injection technique: single-shot Needle Needle type: Whitacre and Introducer  Needle gauge: 24 G Needle length: 9 cm Additional Notes Negative paresthesia. Positive free-flowing CSF. Expiration date of kit checked and confirmed. Patient tolerated procedure well, without complications.

## 2019-07-22 LAB — CBC
HCT: 27.3 % — ABNORMAL LOW (ref 36.0–46.0)
Hemoglobin: 9.2 g/dL — ABNORMAL LOW (ref 12.0–15.0)
MCH: 29.9 pg (ref 26.0–34.0)
MCHC: 33.7 g/dL (ref 30.0–36.0)
MCV: 88.6 fL (ref 80.0–100.0)
Platelets: 142 10*3/uL — ABNORMAL LOW (ref 150–400)
RBC: 3.08 MIL/uL — ABNORMAL LOW (ref 3.87–5.11)
RDW: 13.1 % (ref 11.5–15.5)
WBC: 10 10*3/uL (ref 4.0–10.5)
nRBC: 0 % (ref 0.0–0.2)

## 2019-07-22 LAB — URINALYSIS, ROUTINE W REFLEX MICROSCOPIC
Bilirubin Urine: NEGATIVE
Glucose, UA: 150 mg/dL — AB
Ketones, ur: NEGATIVE mg/dL
Nitrite: NEGATIVE
Protein, ur: 100 mg/dL — AB
Specific Gravity, Urine: 1.015 (ref 1.005–1.030)
pH: 5 (ref 5.0–8.0)

## 2019-07-22 LAB — GLUCOSE, CAPILLARY
Glucose-Capillary: 209 mg/dL — ABNORMAL HIGH (ref 70–99)
Glucose-Capillary: 265 mg/dL — ABNORMAL HIGH (ref 70–99)
Glucose-Capillary: 177 mg/dL — ABNORMAL HIGH (ref 70–99)
Glucose-Capillary: 121 mg/dL — ABNORMAL HIGH (ref 70–99)

## 2019-07-22 LAB — BASIC METABOLIC PANEL
Anion gap: 9 (ref 5–15)
BUN: 35 mg/dL — ABNORMAL HIGH (ref 8–23)
CO2: 25 mmol/L (ref 22–32)
Calcium: 8.2 mg/dL — ABNORMAL LOW (ref 8.9–10.3)
Chloride: 106 mmol/L (ref 98–111)
Creatinine, Ser: 1.24 mg/dL — ABNORMAL HIGH (ref 0.44–1.00)
GFR calc Af Amer: 45 mL/min — ABNORMAL LOW (ref >60)
GFR calc non Af Amer: 38 mL/min — ABNORMAL LOW (ref >60)
Glucose, Bld: 233 mg/dL — ABNORMAL HIGH (ref 70–99)
Potassium: 4 mmol/L (ref 3.5–5.1)
Sodium: 140 mmol/L (ref 135–145)

## 2019-07-22 MED ORDER — AMLODIPINE BESYLATE 10 MG PO TABS
10.0000 mg | ORAL_TABLET | Freq: Every day | ORAL | Status: DC
  Administered 2019-07-24: 10 mg via ORAL
  Filled 2019-07-22 (×4): qty 1

## 2019-07-22 NOTE — Progress Notes (Signed)
Pt Refusing to take her medications by mouth this shift. Pt has spit them out at staff, also slapping them out of Rn's hand.

## 2019-07-22 NOTE — Anesthesia Postprocedure Evaluation (Signed)
Anesthesia Post Note  Patient: [name]  Procedure(s) Performed: ARTHROPLASTY BIPOLAR HIP (HEMIARTHROPLASTY) (Left Hip)  Patient location during evaluation: Other Anesthesia Type: Spinal Level of consciousness: awake and alert and confused (baseline dementia) Pain management: pain level controlled Vital Signs Assessment: post-procedure vital signs reviewed and stable Respiratory status: spontaneous breathing, respiratory function stable and patient connected to nasal cannula oxygen Cardiovascular status: blood pressure returned to baseline and stable Postop Assessment: no headache, no backache, no apparent nausea or vomiting and patient able to bend at knees Anesthetic complications: no     Last Vitals:  Vitals:   07/21/19 2310 07/22/19 0325  BP: (!) 112/56 (!) 147/81  Pulse: (!) 101 99  Resp: 17 20  Temp: 37.7 C 37.7 C  SpO2: 100% 96%    Last Pain:  Vitals:   07/22/19 0710  TempSrc:   PainSc: Asleep                 Corinda Gubler

## 2019-07-22 NOTE — Progress Notes (Signed)
  Subjective: 1 Day Post-Op Procedure(s) (LRB): ARTHROPLASTY BIPOLAR HIP (HEMIARTHROPLASTY) (Left) Patient is non-verbal and does not report a pain score. Patient is resting comfortably this AM. Plan is to go Skilled nursing facility after hospital stay. Negative for chest pain and shortness of breath Fever: 101.9 last night. Gastrointestinal:Negative for nausea and vomiting  Objective: Vital signs in last 24 hours: Temp:  [97.9 F (36.6 C)-101.9 F (38.8 C)] 99.7 F (37.6 C) (03/14 0810) Pulse Rate:  [75-101] 90 (03/14 0810) Resp:  [17-21] 17 (03/14 0810) BP: (112-173)/(54-103) 150/71 (03/14 0810) SpO2:  [94 %-100 %] 97 % (03/14 0810)  Intake/Output from previous day:  Intake/Output Summary (Last 24 hours) at 07/22/2019 0949 Last data filed at 07/22/2019 0710 Gross per 24 hour  Intake 2574.81 ml  Output 1625 ml  Net 949.81 ml    Intake/Output this shift: Total I/O In: 1188.2 [I.V.:988.2; IV Piggyback:200] Out: -   Labs: Recent Labs    07/20/19 1817 07/21/19 0453 07/22/19 0638  HGB 12.1 10.8* 9.2*   Recent Labs    07/21/19 0453 07/22/19 0638  WBC 10.2 10.0  RBC 3.69* 3.08*  HCT 32.2* 27.3*  PLT 187 142*   Recent Labs    07/21/19 0453 07/22/19 0638  NA 138 140  K 4.3 4.0  CL 103 106  CO2 30 25  BUN 25* 35*  CREATININE 1.00 1.24*  GLUCOSE 197* 233*  CALCIUM 8.8* 8.2*   Recent Labs    07/20/19 1817  INR 0.9     EXAM General - Patient is Disorganized, Confused and Lacking Extremity - ABD soft Dorsiflexion/Plantar flexion intact Incision: scant drainage Dressing/Incision - blood tinged drainage Motor Function - intact, moving foot and toes well on exam.  Past Medical History:  Diagnosis Date  . Alzheimer's dementia (HCC)   . Cellulitis of left lower extremity   . Diabetes type 2, controlled (HCC)   . Hearing impairment   . Hypertension   . Hypothyroidism   . Unsteady gait   . Vision impairment     Assessment/Plan: 1 Day Post-Op  Procedure(s) (LRB): ARTHROPLASTY BIPOLAR HIP (HEMIARTHROPLASTY) (Left) Active Problems:   Closed left hip fracture (HCC)  Estimated body mass index is 22.46 kg/m as calculated from the following:   Height as of this encounter: 5' (1.524 m).   Weight as of this encounter: 52.2 kg. Advance diet Up with therapy when tolerated.  Labs reviewed this AM. WBC 10.0, Hg 9.2. Cr 1.24, BUN 35, continue to monitor for AKI.  Continue IVF. Begin working on BM.  DVT Prophylaxis - Lovenox, Foot Pumps and TED hose Weight-Bearing as tolerated to left leg  J. Horris Latino, PA-C [facility] 07/22/2019, 9:49 AM

## 2019-07-22 NOTE — Evaluation (Addendum)
Physical Therapy Evaluation Patient Details Name: [name] MRN: [id number] DOB: 1927/11/17 Today's Date: 07/22/2019   History of Present Illness  Pt is a 86y/o female s/p Left hip unipolar hemiarthroplasty (07/21/19) following fall out of WC at SNF (residence). Patient is a poor historian d/t Alzheimers demntia, with documented PMH of diabetes. Nursing (Tammy) gives PLOF history and reports patient is maxA for all transfers and totalA for all ADLs. Patient is not verbally expressive, altough she does use words when agitated, but nods/shakes head to demonstrate understanding  Clinical Impression  Evaluation limited by baseline cognitive impairments as well as increased pain response from patient with all movement and touch. Patient is able to comply with cuing for initiation of supine > side/sit but is unable to complete d/t pain. PT regressed to rolling using handrails, which patient is able to initiate with encouragement and cuing for sequencing, but after multiple attempts patient says "leaves me alone and swats PT assistance away. PT is able to encourage patient to complete ankle pumps with success. Patient is unable to complete any active LE motion, but is able to tolerate heel slide PROM. Would benefit from skilled PT to address above deficits and promote optimal return to PLOF.     Follow Up Recommendations SNF    Equipment Recommendations       Recommendations for Other Services OT consult     Precautions / Restrictions Restrictions Weight Bearing Restrictions: Yes LLE Weight Bearing: Weight bearing as tolerated      Mobility  Bed Mobility Overal bed mobility: Needs Assistance Bed Mobility: Supine to Sit;Rolling Rolling: Max assist   Supine to sit: Total assist     General bed mobility comments: Patient ultimately unable to complete rolling despite maxA and hand placement onto rails d/t pain. Patient is willing to initiate supine > sit with maxA and rail use but cannot  complete d/t pain  Transfers                 General transfer comment: Unable to complete  Ambulation/Gait             General Gait Details: unsafe to attempt  Stairs            Wheelchair Mobility    Modified Rankin (Stroke Patients Only)       Balance                                             Pertinent Vitals/Pain Pain Assessment: Faces Faces Pain Scale: Hurts whole lot Pain Location: L hip Pain Descriptors / Indicators: Grimacing Pain Intervention(s): Limited activity within patient's tolerance    Home Living Family/patient expects to be discharged to:: Assisted living               Home Equipment: Wheelchair - manual;Shower seat Additional Comments: Nursing at [facility] says that patient is maxA x1 for all transfers and ADLs    Prior Function Level of Independence: Needs assistance   Gait / Transfers Assistance Needed: Pt sits in [facility] that nursing propels for her, though nursing does note that she may propel short distances on occassion  ADL's / Homemaking Assistance Needed: Nursing feeds patient (soft diet), dresses patient, reports patient is total assist for grooming, toileting, and bathing. Pt can drink from straw  Comments: Staff reports patient is maxA for all transfers and ADLs  Hand Dominance        Extremity/Trunk Assessment   Upper Extremity Assessment Upper Extremity Assessment: Generalized weakness    Lower Extremity Assessment Lower Extremity Assessment: Generalized weakness       Communication   Communication: Expressive difficulties(Patient appears fairly nonverbal but does demonstrate understanding, ie nods and shakes head. Does use words when she is upset)  Cognition Arousal/Alertness: Lethargic Behavior During Therapy: Agitated Overall Cognitive Status: History of cognitive impairments - at baseline                                 General Comments: dementia at  baseline      General Comments      Exercises General Exercises - Lower Extremity Ankle Circles/Pumps: 10 reps;AROM;Both Heel Slides: PROM;Left;10 reps Other Exercises Other Exercises: PT attempted supine > sit transfer, which patient does well following cues to initiate with hands on handrails, but is unable to complete d/t pain. PT encouraged paitnet through rolling attempts with cuing for set up, but patient is unable to complete and says "leave me alone!" swatting PT hands away. PT attempted glute sets and quad sets which patient is unable to understand. PT is able to complete some PROM (see above) before pain increases and patient is able to complete ankle pumps.   Assessment/Plan    PT Assessment Patient needs continued PT services  PT Problem List Decreased range of motion;Decreased strength;Decreased mobility;Decreased activity tolerance;Decreased balance;Pain;Decreased cognition       PT Treatment Interventions Gait training;Therapeutic exercise;Balance training;Functional mobility training;Neuromuscular re-education;Manual techniques;Patient/family education;DME instruction;Therapeutic activities;Cognitive remediation    PT Goals (Current goals can be found in the Care Plan section)  Acute Rehab PT Goals Patient Stated Goal: return home PT Goal Formulation: With patient Time For Goal Achievement: 08/05/19 Potential to Achieve Goals: Fair    Frequency 7X/week   Barriers to discharge Decreased caregiver support      Co-evaluation               AM-PAC PT "6 Clicks" Mobility  Outcome Measure Help needed turning from your back to your side while in a flat bed without using bedrails?: A Lot Help needed moving from lying on your back to sitting on the side of a flat bed without using bedrails?: Total Help needed moving to and from a bed to a chair (including a wheelchair)?: Total Help needed standing up from a chair using your arms (e.g., wheelchair or bedside  chair)?: Total Help needed to walk in hospital room?: Total Help needed climbing 3-5 steps with a railing? : Total 6 Click Score: 7    End of Session Equipment Utilized During Treatment: Gait belt Activity Tolerance: Patient tolerated treatment well Patient left: in bed;with bed alarm set Nurse Communication: Mobility status;Need for lift equipment PT Visit Diagnosis: Other abnormalities of gait and mobility (R26.89);Repeated falls (R29.6);Difficulty in walking, not elsewhere classified (R26.2);Muscle weakness (generalized) (M62.81);Unsteadiness on feet (R26.81);Pain Pain - Right/Left: Left Pain - part of body: Hip    Time: 2423-5361 PT Time Calculation (min) (ACUTE ONLY): 18 min   Charges:   PT Evaluation $PT Eval Moderate Complexity: 1 Mod PT Treatments $Therapeutic Activity: 8-22 mins        Shelton Silvas PT, DPT  Shelton Silvas 07/22/2019, 1:07 PM

## 2019-07-22 NOTE — Progress Notes (Signed)
Pt refusing to take medications. Slapped out of this writers hands.

## 2019-07-22 NOTE — Progress Notes (Signed)
PROGRESS NOTE    [name]  HBZ:[id number] DOB: [birth year] DOA: 07/20/2019 PCP: Clemetine Marker, FNP      Assessment & Plan:   Active Problems:   Closed left hip fracture (HCC)   Left hip fracture: like secondary to unwitnessed fall. S/p left hip unipolar hemiarthroplasty. Morphine, percocet prn for pain. Zofran prn for nausea/vomiting. Ortho surg following and recs apprec   Hypertensive urgency: resolved. Not on any anti-HTN as per home med list, will start amlodipine. Continue on IV labetalol & hydralazine prn  Dyslipidemia: continue on statin   Hypothyroidism: will continue on home dose of synthroid.  DM2: will continue SSI w/ accuchecks. Carb modified diet    Thrombocytopenia: etiology unclear. Will continue to monitor    DVT prophylaxis: lovenox  Code Status: full  Family Communication: discussed pt's care w/ pt's daughter, [name], and answered her questions Disposition Plan: will need to go SNF; CM is working on this    Consultants:   Ortho surgery    Procedures:   Antimicrobials:    Subjective: Pt c/o "I'm tired, leave me alone."  Objective: Vitals:   07/21/19 1951 07/21/19 2135 07/21/19 2310 07/22/19 0325  BP:   (!) 112/56 (!) 147/81  Pulse:   (!) 101 99  Resp:   17 20  Temp: (!) 100.9 F (38.3 C) 99 F (37.2 C) 99.9 F (37.7 C) 99.8 F (37.7 C)  TempSrc: Oral Oral Oral Oral  SpO2:   100% 96%  Weight:      Height:        Intake/Output Summary (Last 24 hours) at 07/22/2019 0807 Last data filed at 07/22/2019 0710 Gross per 24 hour  Intake 2584.81 ml  Output 1625 ml  Net 959.81 ml   Filed Weights   07/20/19 1632 07/20/19 2228  Weight: 49.9 kg 52.2 kg    Examination:  General exam: Appears calm and comfortable. Frail appearing   Respiratory system: decreased breath sounds b/l. No wheezes, rhonchi Cardiovascular system: S1 & S2+. No rubs, gallops or clicks.  Gastrointestinal system: Abdomen is nondistended, soft and  nontender.  Hypoactive bowel sounds heard. Central nervous system: Alert and awake. Moves all 4 extremities  Psychiatry: agitated      Data Reviewed: I have personally reviewed following labs and imaging studies  CBC: Recent Labs  Lab 07/20/19 1817 07/21/19 0453 07/22/19 0638  WBC 12.5* 10.2 10.0  HGB 12.1 10.8* 9.2*  HCT 36.8 32.2* 27.3*  MCV 88.2 87.3 88.6  PLT 204 187 142*   Basic Metabolic Panel: Recent Labs  Lab 07/20/19 1817 07/21/19 0453 07/22/19 0638  NA 136 138 140  K 4.5 4.3 4.0  CL 101 103 106  CO2 23 30 25   GLUCOSE 212* 197* 233*  BUN 21 25* 35*  CREATININE 1.06* 1.00 1.24*  CALCIUM 9.1 8.8* 8.2*   GFR: Estimated Creatinine Clearance: 22.1 mL/min (A) (by C-G formula based on SCr of 1.24 mg/dL (H)). Liver Function Tests: No results for input(s): AST, ALT, ALKPHOS, BILITOT, PROT, ALBUMIN in the last 168 hours. No results for input(s): LIPASE, AMYLASE in the last 168 hours. No results for input(s): AMMONIA in the last 168 hours. Coagulation Profile: Recent Labs  Lab 07/20/19 1817  INR 0.9   Cardiac Enzymes: No results for input(s): CKTOTAL, CKMB, CKMBINDEX, TROPONINI in the last 168 hours. BNP (last 3 results) No results for input(s): PROBNP in the last 8760 hours. HbA1C: No results for input(s): HGBA1C in the last 72 hours. CBG: Recent Labs  Lab  07/21/19 1031 07/21/19 1133 07/21/19 1632 07/21/19 2039  GLUCAP 169* 162* 276* 246*   Lipid Profile: No results for input(s): CHOL, HDL, LDLCALC, TRIG, CHOLHDL, LDLDIRECT in the last 72 hours. Thyroid Function Tests: No results for input(s): TSH, T4TOTAL, FREET4, T3FREE, THYROIDAB in the last 72 hours. Anemia Panel: No results for input(s): VITAMINB12, FOLATE, FERRITIN, TIBC, IRON, RETICCTPCT in the last 72 hours. Sepsis Labs: No results for input(s): PROCALCITON, LATICACIDVEN in the last 168 hours.  Recent Results (from the past 240 hour(s))  SARS CORONAVIRUS 2 (TAT 6-24 HRS) Nasopharyngeal  Nasopharyngeal Swab     Status: None   Collection Time: 07/20/19  6:52 PM   Specimen: Nasopharyngeal Swab  Result Value Ref Range Status   SARS Coronavirus 2 NEGATIVE NEGATIVE Final    Comment: (NOTE) SARS-CoV-2 target nucleic acids are NOT DETECTED. The SARS-CoV-2 RNA is generally detectable in upper and lower respiratory specimens during the acute phase of infection. Negative results do not preclude SARS-CoV-2 infection, do not rule out co-infections with other pathogens, and should not be used as the sole basis for treatment or other patient management decisions. Negative results must be combined with clinical observations, patient history, and epidemiological information. The expected result is Negative. Fact Sheet for Patients: SugarRoll.be Fact Sheet for Healthcare Providers: https://www.woods-mathews.com/ This test is not yet approved or cleared by the [place] FDA and  has been authorized for detection and/or diagnosis of SARS-CoV-2 by FDA under an Emergency Use Authorization (EUA). This EUA will remain  in effect (meaning this test can be used) for the duration of the COVID-19 declaration under Section 56 4(b)(1) of the Act, 21 U.S.C. section 360bbb-3(b)(1), unless the authorization is terminated or revoked sooner. Performed at [facility], [place] [address], [place], [place] [address]   Surgical PCR screen     Status: None   Collection Time: 07/21/19  6:24 AM   Specimen: Nasal Mucosa; Nasal Swab  Result Value Ref Range Status   MRSA, PCR NEGATIVE NEGATIVE Final   Staphylococcus aureus NEGATIVE NEGATIVE Final    Comment: (NOTE) The Xpert SA Assay (FDA approved for NASAL specimens in patients 11 years of age and older), is one component of a comprehensive surveillance program. It is not intended to diagnose infection nor to guide or monitor treatment. Performed at [facility], [address] [place],  [place] [address]          Radiology Studies: DG Chest 1 View  Result Date: 07/20/2019 CLINICAL DATA:  Recent fall with known left hip fracture EXAM: CHEST  1 VIEW COMPARISON:  03/25/18 FINDINGS: Cardiac shadow is mildly enlarged. Lungs are well aerated bilaterally. No focal infiltrate or sizable effusion is seen. Lungs are clear bilaterally. Nipple shadows are noted bilaterally. No acute bony abnormality is seen. IMPRESSION: No acute abnormality noted. Electronically Signed   By: Inez Catalina M.D.   On: 07/20/2019 18:11   DG HIP UNILAT W OR W/O PELVIS 2-3 VIEWS LEFT  Result Date: 07/21/2019 CLINICAL DATA:  Status post left hemiarthroplasty EXAM: DG HIP (WITH OR WITHOUT PELVIS) 2-3V LEFT COMPARISON:  07/20/2019 FINDINGS: New left hemiarthroplasty appears well-seated and well aligned. No acute fracture or evidence of an operative complication. IMPRESSION: Well-positioned left hip hemiarthroplasty. Electronically Signed   By: Lajean Manes M.D.   On: 07/21/2019 12:36   DG Hip Unilat W or Wo Pelvis 2-3 Views Left  Result Date: 07/20/2019 CLINICAL DATA:  Left hip pain following fall, initial encounter EXAM: DG HIP (WITH  OR WITHOUT PELVIS) 3V LEFT COMPARISON:  None. FINDINGS: Subcapital left femoral neck fracture is noted with impaction and angulation at the fracture site. Pelvic ring is otherwise intact. Right hip replacement is seen. Calcified uterine fibroids are noted. IMPRESSION: Left femoral neck fracture with impaction and angulation at the fracture site. Electronically Signed   By: Alcide Clever M.D.   On: 07/20/2019 18:09        Scheduled Meds: . acetaminophen  1,000 mg Oral Q6H  . atorvastatin  20 mg Oral Daily  . Chlorhexidine Gluconate Cloth  6 each Topical Daily  . docusate sodium  100 mg Oral BID  . enoxaparin (LOVENOX) injection  30 mg Subcutaneous Q24H  . feeding supplement (ENSURE ENLIVE)  237 mL Oral TID BM  . gabapentin  100 mg Oral Daily  . insulin aspart  0-9 Units  Subcutaneous TID WC  . levothyroxine  25 mcg Oral Q0600  . mirtazapine  15 mg Oral QHS  . senna-docusate  2 tablet Oral BID  . traZODone  50 mg Oral QHS   Continuous Infusions: . sodium chloride 75 mL/hr at 07/22/19 0050     LOS: 2 days    Time spent: 30 mins    Charise Killian, MD [facility] Pager [phone]  If 7PM-7AM, please contact night-coverage [email or web address] 07/22/2019, 8:07 AM

## 2019-07-23 LAB — BASIC METABOLIC PANEL
Anion gap: 9 (ref 5–15)
BUN: 39 mg/dL — ABNORMAL HIGH (ref 8–23)
CO2: 26 mmol/L (ref 22–32)
Calcium: 8.1 mg/dL — ABNORMAL LOW (ref 8.9–10.3)
Chloride: 105 mmol/L (ref 98–111)
Creatinine, Ser: 1.38 mg/dL — ABNORMAL HIGH (ref 0.44–1.00)
GFR calc Af Amer: 39 mL/min — ABNORMAL LOW (ref >60)
GFR calc non Af Amer: 34 mL/min — ABNORMAL LOW (ref >60)
Glucose, Bld: 252 mg/dL — ABNORMAL HIGH (ref 70–99)
Potassium: 3.9 mmol/L (ref 3.5–5.1)
Sodium: 140 mmol/L (ref 135–145)

## 2019-07-23 LAB — GLUCOSE, CAPILLARY
Glucose-Capillary: 160 mg/dL — ABNORMAL HIGH (ref 70–99)
Glucose-Capillary: 206 mg/dL — ABNORMAL HIGH (ref 70–99)
Glucose-Capillary: 110 mg/dL — ABNORMAL HIGH (ref 70–99)
Glucose-Capillary: 131 mg/dL — ABNORMAL HIGH (ref 70–99)

## 2019-07-23 LAB — CBC
HCT: 27.1 % — ABNORMAL LOW (ref 36.0–46.0)
Hemoglobin: 9 g/dL — ABNORMAL LOW (ref 12.0–15.0)
MCH: 29.7 pg (ref 26.0–34.0)
MCHC: 33.2 g/dL (ref 30.0–36.0)
MCV: 89.4 fL (ref 80.0–100.0)
Platelets: 157 10*3/uL (ref 150–400)
RBC: 3.03 MIL/uL — ABNORMAL LOW (ref 3.87–5.11)
RDW: 13.1 % (ref 11.5–15.5)
WBC: 9.6 10*3/uL (ref 4.0–10.5)
nRBC: 0 % (ref 0.0–0.2)

## 2019-07-23 NOTE — Progress Notes (Signed)
Patient turned and repositioned to left side, HOB at 45

## 2019-07-23 NOTE — Progress Notes (Signed)
Patient refused medications would not open mouth and kept turning head saying " Uh uh". Poor PO intake. Will continue to monitor

## 2019-07-23 NOTE — Progress Notes (Signed)
  Subjective: 2 Days Post-Op Procedure(s) (LRB): ARTHROPLASTY BIPOLAR HIP (HEMIARTHROPLASTY) (Left) Patient is non-verbal and does not report a pain score. Patient is resting comfortably this AM. Plan is to go Skilled nursing facility after hospital stay. Negative for chest pain and shortness of breath Fever: 101 this AM. Gastrointestinal:Negative for nausea and vomiting  Objective: Vital signs in last 24 hours: Temp:  [98.9 F (37.2 C)-101 F (38.3 C)] 101 F (38.3 C) (03/15 1136) Pulse Rate:  [87-94] 87 (03/15 1136) Resp:  [14-19] 14 (03/15 1136) BP: (134-158)/(58-80) 158/72 (03/14 2314) SpO2:  [98 %-100 %] 100 % (03/15 1136)  Intake/Output from previous day:  Intake/Output Summary (Last 24 hours) at 07/23/2019 1251 Last data filed at 07/23/2019 0800 Gross per 24 hour  Intake 691.27 ml  Output 1300 ml  Net -608.73 ml    Intake/Output this shift: Total I/O In: -  Out: 650 [Urine:650]  Labs: Recent Labs    07/20/19 1817 07/21/19 0453 07/22/19 0638 07/23/19 0956  HGB 12.1 10.8* 9.2* 9.0*   Recent Labs    07/22/19 0638 07/23/19 0956  WBC 10.0 9.6  RBC 3.08* 3.03*  HCT 27.3* 27.1*  PLT 142* 157   Recent Labs    07/22/19 0638 07/23/19 0956  NA 140 140  K 4.0 3.9  CL 106 105  CO2 25 26  BUN 35* 39*  CREATININE 1.24* 1.38*  GLUCOSE 233* 252*  CALCIUM 8.2* 8.1*   Recent Labs    07/20/19 1817  INR 0.9     EXAM General - Patient is Disorganized, Confused and Lacking Extremity - ABD soft Dorsiflexion/Plantar flexion intact Incision: scant drainage Dressing/Incision - blood tinged drainage Motor Function - intact, moving foot and toes well on exam.  Past Medical History:  Diagnosis Date  . Alzheimer's dementia (HCC)   . Cellulitis of left lower extremity   . Diabetes type 2, controlled (HCC)   . Hearing impairment   . Hypertension   . Hypothyroidism   . Unsteady gait   . Vision impairment     Assessment/Plan: 2 Days Post-Op Procedure(s)  (LRB): ARTHROPLASTY BIPOLAR HIP (HEMIARTHROPLASTY) (Left) Active Problems:   Closed left hip fracture (HCC)  Estimated body mass index is 22.46 kg/m as calculated from the following:   Height as of this encounter: 5' (1.524 m).   Weight as of this encounter: 52.2 kg. Advance diet Up with therapy when tolerated.  Labs reviewed this AM. WBC 9.6, Hg 9.0.  Recent fever of 101 today.  Continue to monitor. Continue to work on having a BM. Continue with PT however patient is pretty resistive to PT exercises.  DVT Prophylaxis - Lovenox, Foot Pumps and TED hose Weight-Bearing as tolerated to left leg  J. Horris Latino, PA-C [facility] Orthopaedic Surgery 07/23/2019, 12:51 PM

## 2019-07-23 NOTE — Progress Notes (Addendum)
PROGRESS NOTE    [name]  XBJ:[id number] DOB: 1928/03/11 DOA: 07/20/2019 PCP: Clemetine Marker, FNP      Assessment & Plan:   Active Problems:   Closed left hip fracture (HCC)   Left hip fracture: like secondary to unwitnessed fall. S/p left hip unipolar hemiarthroplasty. Morphine, percocet prn for pain. Zofran prn for nausea/vomiting. Ortho surg following and recs apprec. PT recs SNF. Fever of 101 x1 today, tylenol given. If fever occurs again, will obtain blood cultures   Hypertensive urgency: resolved. Not on any anti-HTN as per home med list, will start & continue amlodipine. Continue on IV labetalol & hydralazine prn  Dyslipidemia: continue on statin   Hypothyroidism: will continue on home dose of synthroid.  DM2: will continue SSI w/ accuchecks. Carb modified diet    Thrombocytopenia: etiology unclear. Will continue to monitor   Likely acute post op blood loss anemia: no need for a transfusion at this time. Will continue to monitor   DVT prophylaxis: lovenox  Code Status: full  Family Communication:  Disposition Plan: will need to go SNF; CM is working on this    Consultants:   Ortho surgery    Procedures:   Antimicrobials:    Subjective: Pt refuses to answer any of my questions today but will follow simple commands  Objective: Vitals:   07/22/19 0810 07/22/19 1824 07/22/19 1958 07/22/19 2314  BP: (!) 150/71 (!) 138/58 134/80 (!) 158/72  Pulse: 90 90 88 94  Resp: 17 19  18   Temp: 99.7 F (37.6 C) 99.2 F (37.3 C)  98.9 F (37.2 C)  TempSrc: Oral Oral  Axillary  SpO2: 97% 98%  99%  Weight:      Height:        Intake/Output Summary (Last 24 hours) at 07/23/2019 0758 Last data filed at 07/23/2019 0251 Gross per 24 hour  Intake 691.27 ml  Output 650 ml  Net 41.27 ml   Filed Weights   07/20/19 1632 07/20/19 2228  Weight: 49.9 kg 52.2 kg    Examination:  General exam: Appears calm and comfortable. Frail appearing     Respiratory system: diminished breath sounds b/l. No rales Cardiovascular system: S1 & S2+. No rubs, gallops or clicks.  Gastrointestinal system: Abdomen is nondistended, soft and nontender.  Hypoactive bowel sounds heard. Central nervous system: Alert and awake. Moves all 4 extremities  Psychiatry: flat mood and affect       Data Reviewed: I have personally reviewed following labs and imaging studies  CBC: Recent Labs  Lab 07/20/19 1817 07/21/19 0453 07/22/19 0638  WBC 12.5* 10.2 10.0  HGB 12.1 10.8* 9.2*  HCT 36.8 32.2* 27.3*  MCV 88.2 87.3 88.6  PLT 204 187 142*   Basic Metabolic Panel: Recent Labs  Lab 07/20/19 1817 07/21/19 0453 07/22/19 0638  NA 136 138 140  K 4.5 4.3 4.0  CL 101 103 106  CO2 23 30 25   GLUCOSE 212* 197* 233*  BUN 21 25* 35*  CREATININE 1.06* 1.00 1.24*  CALCIUM 9.1 8.8* 8.2*   GFR: Estimated Creatinine Clearance: 22.1 mL/min (A) (by C-G formula based on SCr of 1.24 mg/dL (H)). Liver Function Tests: No results for input(s): AST, ALT, ALKPHOS, BILITOT, PROT, ALBUMIN in the last 168 hours. No results for input(s): LIPASE, AMYLASE in the last 168 hours. No results for input(s): AMMONIA in the last 168 hours. Coagulation Profile: Recent Labs  Lab 07/20/19 1817  INR 0.9   Cardiac Enzymes: No results for input(s): CKTOTAL, CKMB,  CKMBINDEX, TROPONINI in the last 168 hours. BNP (last 3 results) No results for input(s): PROBNP in the last 8760 hours. HbA1C: No results for input(s): HGBA1C in the last 72 hours. CBG: Recent Labs  Lab 07/22/19 0807 07/22/19 1127 07/22/19 1719 07/22/19 2114 07/23/19 0733  GLUCAP 209* 265* 177* 121* 160*   Lipid Profile: No results for input(s): CHOL, HDL, LDLCALC, TRIG, CHOLHDL, LDLDIRECT in the last 72 hours. Thyroid Function Tests: No results for input(s): TSH, T4TOTAL, FREET4, T3FREE, THYROIDAB in the last 72 hours. Anemia Panel: No results for input(s): VITAMINB12, FOLATE, FERRITIN, TIBC, IRON,  RETICCTPCT in the last 72 hours. Sepsis Labs: No results for input(s): PROCALCITON, LATICACIDVEN in the last 168 hours.  Recent Results (from the past 240 hour(s))  SARS CORONAVIRUS 2 (TAT 6-24 HRS) Nasopharyngeal Nasopharyngeal Swab     Status: None   Collection Time: 07/20/19  6:52 PM   Specimen: Nasopharyngeal Swab  Result Value Ref Range Status   SARS Coronavirus 2 NEGATIVE NEGATIVE Final    Comment: (NOTE) SARS-CoV-2 target nucleic acids are NOT DETECTED. The SARS-CoV-2 RNA is generally detectable in upper and lower respiratory specimens during the acute phase of infection. Negative results do not preclude SARS-CoV-2 infection, do not rule out co-infections with other pathogens, and should not be used as the sole basis for treatment or other patient management decisions. Negative results must be combined with clinical observations, patient history, and epidemiological information. The expected result is Negative. Fact Sheet for Patients: HairSlick.no Fact Sheet for Healthcare Providers: quierodirigir.com This test is not yet approved or cleared by the [place] FDA and  has been authorized for detection and/or diagnosis of SARS-CoV-2 by FDA under an Emergency Use Authorization (EUA). This EUA will remain  in effect (meaning this test can be used) for the duration of the COVID-19 declaration under Section 56 4(b)(1) of the Act, 21 U.S.C. section 360bbb-3(b)(1), unless the authorization is terminated or revoked sooner. Performed at [facility], [address] [place], [place] [address]   Surgical PCR screen     Status: None   Collection Time: 07/21/19  6:24 AM   Specimen: Nasal Mucosa; Nasal Swab  Result Value Ref Range Status   MRSA, PCR NEGATIVE NEGATIVE Final   Staphylococcus aureus NEGATIVE NEGATIVE Final    Comment: (NOTE) The Xpert SA Assay (FDA approved for NASAL specimens in patients 24 years of age  and older), is one component of a comprehensive surveillance program. It is not intended to diagnose infection nor to guide or monitor treatment. Performed at [facility], [address] [place], [place] [address]          Radiology Studies: DG HIP UNILAT W OR W/O PELVIS 2-3 VIEWS LEFT  Result Date: 07/21/2019 CLINICAL DATA:  Status post left hemiarthroplasty EXAM: DG HIP (WITH OR WITHOUT PELVIS) 2-3V LEFT COMPARISON:  07/20/2019 FINDINGS: New left hemiarthroplasty appears well-seated and well aligned. No acute fracture or evidence of an operative complication. IMPRESSION: Well-positioned left hip hemiarthroplasty. Electronically Signed   By: Amie Portland M.D.   On: 07/21/2019 12:36        Scheduled Meds: . amLODipine  10 mg Oral Daily  . atorvastatin  20 mg Oral Daily  . Chlorhexidine Gluconate Cloth  6 each Topical Daily  . docusate sodium  100 mg Oral BID  . enoxaparin (LOVENOX) injection  30 mg Subcutaneous Q24H  . feeding supplement (ENSURE ENLIVE)  237 mL Oral TID BM  . gabapentin  100 mg  Oral Daily  . insulin aspart  0-9 Units Subcutaneous TID WC  . levothyroxine  25 mcg Oral Q0600  . mirtazapine  15 mg Oral QHS  . senna-docusate  2 tablet Oral BID  . traZODone  50 mg Oral QHS   Continuous Infusions:    LOS: 3 days    Time spent: 30 mins    Wyvonnia Dusky, MD [facility] Pager [phone]  If 7PM-7AM, please contact night-coverage [email or web address] 07/23/2019, 7:58 AM

## 2019-07-23 NOTE — Progress Notes (Signed)
Patients  Afebrile at this time Temp 98.4 axillary

## 2019-07-23 NOTE — NC FL2 (Signed)
[place] LEVEL OF CARE SCREENING TOOL     IDENTIFICATION  Patient Name: [name] Birthdate: Oct 07, 1927 Sex: female Admission Date (Current Location): 07/20/2019  [place] and [place] Number:  Engineering geologist and Address:  [facility], [address], [place],  [address]      Provider Number: [id number]  Attending Physician Name and Address:  Wyvonnia Dusky, MD  Relative Name and Phone Number:  Junious Silk [phone]    Current Level of Care: Hospital Recommended Level of Care: [place] Prior Approval Number:    Date Approved/Denied:   PASRR Number: [id number]  Discharge Plan: SNF    Current Diagnoses: Patient Active Problem List   Diagnosis Date Noted  . Closed left hip fracture ([place]) 07/20/2019  . Pressure injury of skin 03/26/2018  . Acute renal failure (ARF) ([place]) 03/25/2018  . Protein-calorie malnutrition, severe 01/30/2018  . Hypothyroidism 01/27/2018  . HTN (hypertension) 01/27/2018  . Diabetes ([place]) 01/27/2018  . Pressure ulcer 04/12/2015  . Gangrene ([place]) 04/11/2015  . Urinary retention 02/04/2015  . Atelectasis 01/10/2015  . Acute encephalopathy 01/10/2015  . Dementia ([place]) 01/10/2015  . Closed right hip fracture ([place]) 01/07/2015    Orientation RESPIRATION BLADDER Height & Weight     Self  Normal, O2 Continent Weight: 52.2 kg Height:  5' (152.4 cm)  BEHAVIORAL SYMPTOMS/MOOD NEUROLOGICAL BOWEL NUTRITION STATUS      Continent Diet(Carb Modified)  AMBULATORY STATUS COMMUNICATION OF NEEDS Skin   Extensive Assist Verbally Surgical wounds                       Personal Care Assistance Level of Assistance  Dressing     Dressing Assistance: Limited assistance     Functional Limitations Info  Hearing   Hearing Info: Impaired      SPECIAL CARE FACTORS FREQUENCY  PT (By licensed PT), OT (By licensed OT)     PT Frequency: 5 times per week OT Frequency:  5 times per week            Contractures Contractures Info: Not present    Additional Factors Info  Allergies   Allergies Info: NKDA           Current Medications (07/23/2019):  This is the current hospital active medication list Current Facility-Administered Medications  Medication Dose Route Frequency Provider Last Rate Last Admin  . acetaminophen (TYLENOL) tablet 650 mg  650 mg Oral Q6H PRN Poggi, Marshall Cork, MD       Or  . acetaminophen (TYLENOL) suppository 650 mg  650 mg Rectal Q6H PRN Poggi, Marshall Cork, MD   650 mg at 07/21/19 2003  . amLODipine (NORVASC) tablet 10 mg  10 mg Oral Daily Wyvonnia Dusky, MD      . atorvastatin (LIPITOR) tablet 20 mg  20 mg Oral Daily Poggi, Marshall Cork, MD   20 mg at 07/22/19 0800  . bisacodyl (DULCOLAX) suppository 10 mg  10 mg Rectal Daily PRN Poggi, Marshall Cork, MD      . Chlorhexidine Gluconate Cloth 2 % PADS 6 each  6 each Topical Daily Wyvonnia Dusky, MD   6 each at 07/23/19 [phone]  . diphenhydrAMINE (BENADRYL) 12.5 MG/5ML elixir 12.5-25 mg  12.5-25 mg Oral Q4H PRN Poggi, Marshall Cork, MD      . docusate sodium (COLACE) capsule 100 mg  100 mg Oral BID Poggi, Marshall Cork, MD   100 mg at 07/22/19 0800  .  enoxaparin (LOVENOX) injection 30 mg  30 mg Subcutaneous Q24H Poggi, Excell Seltzer, MD   30 mg at 07/23/19 0859  . feeding supplement (ENSURE ENLIVE) (ENSURE ENLIVE) liquid 237 mL  237 mL Oral TID BM Poggi, Excell Seltzer, MD   237 mL at 07/22/19 0801  . gabapentin (NEURONTIN) capsule 100 mg  100 mg Oral Daily Poggi, Excell Seltzer, MD   100 mg at 07/22/19 0800  . hydrALAZINE (APRESOLINE) injection 10 mg  10 mg Intravenous Q4H PRN Poggi, Excell Seltzer, MD   10 mg at 07/20/19 2114  . insulin aspart (novoLOG) injection 0-9 Units  0-9 Units Subcutaneous TID WC Charise Killian, MD   2 Units at 07/23/19 0859  . labetalol (NORMODYNE) injection 20 mg  20 mg Intravenous Q3H PRN Poggi, Excell Seltzer, MD   20 mg at 07/21/19 0753  . levothyroxine (SYNTHROID) tablet 25 mcg  25 mcg Oral Q0600 Poggi, Excell Seltzer, MD   Stopped at 07/21/19 [phone]  . magnesium hydroxide (MILK OF MAGNESIA) suspension 30 mL  30 mL Oral Daily PRN Poggi, Excell Seltzer, MD      . metoCLOPramide (REGLAN) tablet 5 mg  5 mg Oral Q8H PRN Poggi, Excell Seltzer, MD       Or  . metoCLOPramide (REGLAN) injection 5 mg  5 mg Intravenous Q8H PRN Poggi, Excell Seltzer, MD      . mirtazapine (REMERON) tablet 15 mg  15 mg Oral QHS Poggi, Excell Seltzer, MD      . morphine 2 MG/ML injection 2 mg  2 mg Intravenous Q4H PRN Poggi, Excell Seltzer, MD   2 mg at 07/20/19 2117  . ondansetron (ZOFRAN) tablet 4 mg  4 mg Oral Q6H PRN Poggi, Excell Seltzer, MD       Or  . ondansetron (ZOFRAN) injection 4 mg  4 mg Intravenous Q6H PRN Poggi, Excell Seltzer, MD      . oxyCODONE (Oxy IR/ROXICODONE) immediate release tablet 5 mg  5 mg Oral Q4H PRN Poggi, Excell Seltzer, MD      . senna-docusate (Senokot-S) tablet 2 tablet  2 tablet Oral BID Poggi, Excell Seltzer, MD   2 tablet at 07/22/19 0800  . sodium phosphate (FLEET) 7-19 GM/118ML enema 1 enema  1 enema Rectal Once PRN Poggi, Excell Seltzer, MD      . traMADol Janean Sark) tablet 50 mg  50 mg Oral Q6H PRN Poggi, Excell Seltzer, MD      . traZODone (DESYREL) tablet 50 mg  50 mg Oral QHS Poggi, Excell Seltzer, MD         Discharge Medications: Please see discharge summary for a list of discharge medications.  Relevant Imaging Results:  Relevant Lab Results:   Additional Information SS#[id number]  Barrie Dunker, RN

## 2019-07-23 NOTE — Care Management Important Message (Signed)
Important Message  Patient Details  Name: [name] MRN: [id number] Date of Birth: [birth year]   Medicare Important Message Given:  Yes     [name] [name] 07/23/2019, 11:49 AM

## 2019-07-23 NOTE — TOC Progression Note (Signed)
Transition of Care [facility]) - Progression Note    Patient Details  Name: [name] MRN: [id number] Date of Birth: 11/06/1927  Transition of Care [facility]) CM/SW Contact  Barrie Dunker, RN Phone Number: 07/23/2019, 9:54 AM  Clinical Narrative:     Leonor Liv, PASSr completed, will review bed offers once obtained      Expected Discharge Plan and Services                                                 Social Determinants of Health (SDOH) Interventions    Readmission Risk Interventions No flowsheet data found.

## 2019-07-23 NOTE — Progress Notes (Signed)
Sars test collected by this Clinical research associate and taken to Lab for analysis

## 2019-07-23 NOTE — TOC Progression Note (Signed)
Transition of Care [facility]) - Progression Note    Patient Details  Name: [name] MRN: [id number] Date of Birth: 11/08/1927  Transition of Care [facility]) CM/SW [place], RN Phone Number: 07/23/2019, 1:50 PM  Clinical Narrative:    Met with the patient to discuss DC plan and needs, she is only alert to self.  I called marilyn the patient's daughter.  She stated that she lives in [place] and she would like the patient to go to [facility] I accepted the bed offer thru the hub and notified Claiborne Billings as well      Expected Discharge Plan and Services                                                 Social Determinants of Health (SDOH) Interventions    Readmission Risk Interventions No flowsheet data found.

## 2019-07-23 NOTE — Progress Notes (Signed)
Physical Therapy Treatment Patient Details Name: [name] MRN: [id number] DOB: 07-22-1927 Today's Date: 07/23/2019    History of Present Illness Pt is a 86y/o female s/p Left hip unipolar hemiarthroplasty (07/21/19) following fall out of WC at SNF (residence). Patient is a poor historian d/t Alzheimers demntia, with documented PMH of diabetes. Nursing (Tammy) gives PLOF history and reports patient is maxA for all transfers and totalA for all ADLs. Patient is not verbally expressive, altough she does use words when agitated, but nods/shakes head to demonstrate understanding    PT Comments    Pt with fever this am - warm to touch.  Nursing aware.  She remains mostly resistant to therapy interventions.  She does allow slow and gentle PROM for ankle pumps, ab/add and slr LLE.  She resists all hip/knee flexion LLE.  At times she will raise BUE and slap arms on bed which seems to signal she has had enough.  She is given rest periods between each exercise but overall quite limited session.   Follow Up Recommendations  SNF     Equipment Recommendations       Recommendations for Other Services       Precautions / Restrictions Precautions Precautions: Posterior Hip Restrictions Weight Bearing Restrictions: Yes LLE Weight Bearing: Weight bearing as tolerated    Mobility  Bed Mobility                  Transfers                    Ambulation/Gait                 Stairs             Wheelchair Mobility    Modified Rankin (Stroke Patients Only)       Balance                                            Cognition Arousal/Alertness: Lethargic Behavior During Therapy: Agitated Overall Cognitive Status: History of cognitive impairments - at baseline                                 General Comments: dementia at baseline      Exercises Other Exercises Other Exercises: allows only minimal supine ex.  resists  hip/knee flexion.    General Comments        Pertinent Vitals/Pain Pain Assessment: Faces Faces Pain Scale: Hurts whole lot Pain Location: L hip Pain Descriptors / Indicators: Grimacing Pain Intervention(s): Limited activity within patient's tolerance;Monitored during session;Repositioned    Home Living                      Prior Function            PT Goals (current goals can now be found in the care plan section) Progress towards PT goals: Not progressing toward goals - comment    Frequency    7X/week      PT Plan Current plan remains appropriate    Co-evaluation              AM-PAC PT "6 Clicks" Mobility   Outcome Measure  Help needed turning from your back to your side while in a flat bed without using bedrails?: Total Help needed moving  from lying on your back to sitting on the side of a flat bed without using bedrails?: Total Help needed moving to and from a bed to a chair (including a wheelchair)?: Total Help needed standing up from a chair using your arms (e.g., wheelchair or bedside chair)?: Total Help needed to walk in hospital room?: Total Help needed climbing 3-5 steps with a railing? : Total 6 Click Score: 6    End of Session   Activity Tolerance: Patient limited by lethargy;Patient limited by pain Patient left: in bed;with bed alarm set;with call bell/phone within reach Nurse Communication: Mobility status Pain - Right/Left: Left Pain - part of body: Hip     Time: 7915-0569 PT Time Calculation (min) (ACUTE ONLY): 8 min  Charges:  $Therapeutic Exercise: 8-22 mins                     Chesley Noon, PTA 07/23/19, 12:38 PM

## 2019-07-23 NOTE — Progress Notes (Signed)
Patient had fever of 101.0 given PRN tylenol 650mg  via rectal vault , 1157am. PRN morphine given d/t patient screaming out in pain. PO fluids encouraged and received without difficulty

## 2019-07-24 LAB — SURGICAL PATHOLOGY

## 2019-07-24 LAB — BASIC METABOLIC PANEL
Anion gap: 7 (ref 5–15)
BUN: 43 mg/dL — ABNORMAL HIGH (ref 8–23)
CO2: 30 mmol/L (ref 22–32)
Calcium: 8.3 mg/dL — ABNORMAL LOW (ref 8.9–10.3)
Chloride: 107 mmol/L (ref 98–111)
Creatinine, Ser: 1.19 mg/dL — ABNORMAL HIGH (ref 0.44–1.00)
GFR calc Af Amer: 47 mL/min — ABNORMAL LOW (ref >60)
GFR calc non Af Amer: 40 mL/min — ABNORMAL LOW (ref >60)
Glucose, Bld: 225 mg/dL — ABNORMAL HIGH (ref 70–99)
Potassium: 3.9 mmol/L (ref 3.5–5.1)
Sodium: 144 mmol/L (ref 135–145)

## 2019-07-24 LAB — GLUCOSE, CAPILLARY
Glucose-Capillary: 205 mg/dL — ABNORMAL HIGH (ref 70–99)
Glucose-Capillary: 216 mg/dL — ABNORMAL HIGH (ref 70–99)
Glucose-Capillary: 181 mg/dL — ABNORMAL HIGH (ref 70–99)
Glucose-Capillary: 165 mg/dL — ABNORMAL HIGH (ref 70–99)

## 2019-07-24 LAB — CBC
HCT: 27.9 % — ABNORMAL LOW (ref 36.0–46.0)
Hemoglobin: 9.2 g/dL — ABNORMAL LOW (ref 12.0–15.0)
MCH: 29.7 pg (ref 26.0–34.0)
MCHC: 33 g/dL (ref 30.0–36.0)
MCV: 90 fL (ref 80.0–100.0)
Platelets: 174 10*3/uL (ref 150–400)
RBC: 3.1 MIL/uL — ABNORMAL LOW (ref 3.87–5.11)
RDW: 13.1 % (ref 11.5–15.5)
WBC: 8.5 10*3/uL (ref 4.0–10.5)
nRBC: 0 % (ref 0.0–0.2)

## 2019-07-24 LAB — SARS CORONAVIRUS 2 (TAT 6-24 HRS): SARS Coronavirus 2: NEGATIVE

## 2019-07-24 MED ORDER — TRAMADOL HCL 50 MG PO TABS: 50.0000 mg | ORAL_TABLET | Freq: Four times a day (QID) | ORAL | 0 refills | Status: AC | PRN

## 2019-07-24 MED ORDER — ADULT MULTIVITAMIN W/MINERALS CH: 1.0000 | ORAL_TABLET | Freq: Every day | ORAL | Status: DC

## 2019-07-24 MED ORDER — PRO-STAT SUGAR FREE PO LIQD
30.0000 mL | Freq: Two times a day (BID) | ORAL | Status: DC
  Administered 2019-07-24: 30 mL via ORAL

## 2019-07-24 MED ORDER — ENOXAPARIN SODIUM 30 MG/0.3ML ~~LOC~~ SOLN: 30.0000 mg | SUBCUTANEOUS | 0 refills | Status: AC

## 2019-07-24 NOTE — Plan of Care (Signed)
  Problem: Education: Goal: Verbalization of understanding the information provided (i.e., activity precautions, restrictions, etc) will improve Outcome: Not Progressing Note: Patient's cognitive status does not permit  Goal: Individualized Educational Video(s) Outcome: Progressing   Problem: Clinical Measurements: Goal: Postoperative complications will be avoided or minimized Outcome: Progressing   Problem: Self-Concept: Goal: Ability to maintain and perform role responsibilities to the fullest extent possible will improve Outcome: Progressing   Problem: Pain Management: Goal: Pain level will decrease Outcome: Progressing   Problem: Clinical Measurements: Goal: Will remain free from infection Outcome: Progressing   Problem: Nutrition: Goal: Adequate nutrition will be maintained Outcome: Not Progressing Note: Poor appetite and intake throughout the day per report from previous RN. Writer attempted to feed pt dinner. She declined. Accepted 1/2 of ensure supplement and 1 graham cracker with HS med pass.    Problem: Elimination: Goal: Will not experience complications related to bowel motility Outcome: Progressing Goal: Will not experience complications related to urinary retention Outcome: Progressing

## 2019-07-24 NOTE — Progress Notes (Signed)
Physical Therapy Treatment Patient Details Name: [name] MRN: [id number] DOB: 04-04-1928 Today's Date: 07/24/2019    History of Present Illness Pt is a 86y/o female s/p Left hip unipolar hemiarthroplasty (07/21/19) following fall out of WC at SNF (residence). Patient is a poor historian d/t Alzheimers demntia, with documented PMH of diabetes. Nursing (Tammy) gives PLOF history and reports patient is maxA for all transfers and totalA for all ADLs. Patient is not verbally expressive, altough she does use words when agitated, but nods/shakes head to demonstrate understanding    PT Comments    Pt was supine in bed upon arriving requesting a drink. Pt's cognition/orientation deficits limit progression however with increased time and repetition, was cooperative.  She was able to sit on L side of bed with max assist + increased time. Constant cues for adhering to posterior precautions.  She tolerated sitting EOB x ten minutes throughout session and was able to stand 3 x EOB with max assist of one. 2nd person for safety. Total assist required once fatigued to return to supine. Pt overall is progressing but slowly. PT will continue to follow per POC and progress as able.    Follow Up Recommendations  SNF     Equipment Recommendations  Other (comment)(defer to next level of care)    Recommendations for Other Services       Precautions / Restrictions Precautions Precautions: Posterior Hip Precaution Booklet Issued: No Precaution Comments: (cognition deficits present) Restrictions Weight Bearing Restrictions: Yes LLE Weight Bearing: Weight bearing as tolerated    Mobility  Bed Mobility Overal bed mobility: Needs Assistance Bed Mobility: Supine to Sit;Sit to Supine Rolling: Max assist   Supine to sit: Max assist;+2 for safety/equipment;HOB elevated Sit to supine: Total assist   General bed mobility comments: pt was able to sit EOB x ten minutes. she required max assist to achieve  EOB sitting and total assist once fatigued to return to supine. +2 for safety but only +1 for performance.   Transfers Overall transfer level: Needs assistance Equipment used: 1 person hand held assist Transfers: Sit to/from Stand Sit to Stand: Max assist         General transfer comment: pt was able to stand at EOB 3 x with max assist + vcs. pt unable to coordinate use of RW safely so therapist elected to perform STS with BUE support on therapist + gait belt.  Ambulation/Gait             General Gait Details: unsafe to progress at this time   Stairs             Wheelchair Mobility    Modified Rankin (Stroke Patients Only)       Balance                                            Cognition Arousal/Alertness: Awake/alert Behavior During Therapy: WFL for tasks assessed/performed Overall Cognitive Status: History of cognitive impairments - at baseline                                 General Comments: dementia at baseline. Pt requires increased time with all mobility and exercises 2/2 to cognition deficits. She was able to follow simple cues but requires reinforcement and extra time processing. Only oriented to self.  Exercises General Exercises - Lower Extremity Ankle Circles/Pumps: 10 reps;AROM;Both Heel Slides: AAROM;Both;5 reps    General Comments        Pertinent Vitals/Pain Pain Assessment: (unable to rate) Faces Pain Scale: Hurts even more Pain Location: L hip Pain Descriptors / Indicators: Grimacing Pain Intervention(s): Limited activity within patient's tolerance;Monitored during session;Repositioned    Home Living                      Prior Function            PT Goals (current goals can now be found in the care plan section) Acute Rehab PT Goals Patient Stated Goal: " To get better so I wont hurt" Progress towards PT goals: Progressing toward goals    Frequency    7X/week      PT  Plan Current plan remains appropriate    Co-evaluation              AM-PAC PT "6 Clicks" Mobility   Outcome Measure  Help needed turning from your back to your side while in a flat bed without using bedrails?: A Lot Help needed moving from lying on your back to sitting on the side of a flat bed without using bedrails?: A Lot Help needed moving to and from a bed to a chair (including a wheelchair)?: A Lot Help needed standing up from a chair using your arms (e.g., wheelchair or bedside chair)?: A Lot Help needed to walk in hospital room?: Total Help needed climbing 3-5 steps with a railing? : Total 6 Click Score: 10    End of Session Equipment Utilized During Treatment: Gait belt Activity Tolerance: Patient tolerated treatment well;Patient limited by pain Patient left: in bed;with bed alarm set;with call bell/phone within reach Nurse Communication: Mobility status PT Visit Diagnosis: Other abnormalities of gait and mobility (R26.89);Repeated falls (R29.6);Difficulty in walking, not elsewhere classified (R26.2);Muscle weakness (generalized) (M62.81);Unsteadiness on feet (R26.81);Pain Pain - Right/Left: Left Pain - part of body: Hip     Time: 7035-0093 PT Time Calculation (min) (ACUTE ONLY): 15 min  Charges:  $Therapeutic Activity: 8-22 mins                     Jetta Lout PTA 07/24/19, 4:26 PM

## 2019-07-24 NOTE — Progress Notes (Signed)
PROGRESS NOTE    [name]  PYK:[id number] DOB: [birth year] DOA: 07/20/2019 PCP: Roxana Hires, FNP      Assessment & Plan:   Active Problems:   Closed left hip fracture (HCC)   Left hip fracture: like secondary to unwitnessed fall. S/p left hip unipolar hemiarthroplasty. Morphine, percocet prn for pain. Zofran prn for nausea/vomiting. Ortho surg following and recs apprec. PT recs SNF. Fever of 101 x1 07/23/19, tylenol given. If fever occurs again, will obtain blood cultures   Constipation: continue on senna-docusate, enema.   Hypertensive urgency: resolved. Not on any anti-HTN as per home med list, will start & continue amlodipine. Continue on IV labetalol & hydralazine prn  Dyslipidemia: continue on statin   Hypothyroidism: will continue on home dose of synthroid.  DM2: will continue SSI w/ accuchecks. Carb modified diet    Thrombocytopenia: etiology unclear. Will continue to monitor   Likely acute post op blood loss anemia: no need for a transfusion at this time. Will continue to monitor   DVT prophylaxis: lovenox  Code Status: full  Family Communication:  Disposition Plan: will need to go SNF but pt has not had a bowel movement yet; CM is working on this    Consultants:   Ortho surgery    Procedures:   Antimicrobials:    Subjective: Pt is thirsty and asking for water  Objective: Vitals:   07/23/19 1136 07/23/19 1300 07/23/19 2358 07/24/19 0808  BP:   (!) 149/56 (!) 145/78  Pulse: 87  85 77  Resp: 14  16   Temp: (!) 101 F (38.3 C) 98.4 F (36.9 C) 98.5 F (36.9 C) 99.2 F (37.3 C)  TempSrc: Oral Axillary  Oral  SpO2: 100%  96% 100%  Weight:      Height:        Intake/Output Summary (Last 24 hours) at 07/24/2019 0825 Last data filed at 07/24/2019 0513 Gross per 24 hour  Intake 90 ml  Output 800 ml  Net -710 ml   Filed Weights   07/20/19 1632 07/20/19 2228  Weight: 49.9 kg 52.2 kg    Examination:  General exam: Appears  calm and comfortable. Frail appearing   Respiratory system: decreased breath sounds b/l. No rhonchi Cardiovascular system: S1 & S2+. No rubs, gallops or clicks.  Gastrointestinal system: Abdomen is nondistended, soft and nontender.  Hypoactive bowel sounds heard. Central nervous system: Alert and awake. Moves all 4 extremities  Psychiatry: flat mood and affect       Data Reviewed: I have personally reviewed following labs and imaging studies  CBC: Recent Labs  Lab 07/20/19 1817 07/21/19 0453 07/22/19 0638 07/23/19 0956  WBC 12.5* 10.2 10.0 9.6  HGB 12.1 10.8* 9.2* 9.0*  HCT 36.8 32.2* 27.3* 27.1*  MCV 88.2 87.3 88.6 89.4  PLT 204 187 142* 539   Basic Metabolic Panel: Recent Labs  Lab 07/20/19 1817 07/21/19 0453 07/22/19 0638 07/23/19 0956 07/24/19 0506  NA 136 138 140 140 144  K 4.5 4.3 4.0 3.9 3.9  CL 101 103 106 105 107  CO2 23 30 25 26 30   GLUCOSE 212* 197* 233* 252* 225*  BUN 21 25* 35* 39* 43*  CREATININE 1.06* 1.00 1.24* 1.38* 1.19*  CALCIUM 9.1 8.8* 8.2* 8.1* 8.3*   GFR: Estimated Creatinine Clearance: 23 mL/min (A) (by C-G formula based on SCr of 1.19 mg/dL (H)). Liver Function Tests: No results for input(s): AST, ALT, ALKPHOS, BILITOT, PROT, ALBUMIN in the last 168 hours. No results for input(s):  LIPASE, AMYLASE in the last 168 hours. No results for input(s): AMMONIA in the last 168 hours. Coagulation Profile: Recent Labs  Lab 07/20/19 1817  INR 0.9   Cardiac Enzymes: No results for input(s): CKTOTAL, CKMB, CKMBINDEX, TROPONINI in the last 168 hours. BNP (last 3 results) No results for input(s): PROBNP in the last 8760 hours. HbA1C: No results for input(s): HGBA1C in the last 72 hours. CBG: Recent Labs  Lab 07/23/19 0733 07/23/19 1147 07/23/19 1709 07/23/19 2035 07/24/19 0805  GLUCAP 160* 206* 110* 131* 205*   Lipid Profile: No results for input(s): CHOL, HDL, LDLCALC, TRIG, CHOLHDL, LDLDIRECT in the last 72 hours. Thyroid Function  Tests: No results for input(s): TSH, T4TOTAL, FREET4, T3FREE, THYROIDAB in the last 72 hours. Anemia Panel: No results for input(s): VITAMINB12, FOLATE, FERRITIN, TIBC, IRON, RETICCTPCT in the last 72 hours. Sepsis Labs: No results for input(s): PROCALCITON, LATICACIDVEN in the last 168 hours.  Recent Results (from the past 240 hour(s))  SARS CORONAVIRUS 2 (TAT 6-24 HRS) Nasopharyngeal Nasopharyngeal Swab     Status: None   Collection Time: 07/20/19  6:52 PM   Specimen: Nasopharyngeal Swab  Result Value Ref Range Status   SARS Coronavirus 2 NEGATIVE NEGATIVE Final    Comment: (NOTE) SARS-CoV-2 target nucleic acids are NOT DETECTED. The SARS-CoV-2 RNA is generally detectable in upper and lower respiratory specimens during the acute phase of infection. Negative results do not preclude SARS-CoV-2 infection, do not rule out co-infections with other pathogens, and should not be used as the sole basis for treatment or other patient management decisions. Negative results must be combined with clinical observations, patient history, and epidemiological information. The expected result is Negative. Fact Sheet for Patients: HairSlick.no Fact Sheet for Healthcare Providers: quierodirigir.com This test is not yet approved or cleared by the [place] FDA and  has been authorized for detection and/or diagnosis of SARS-CoV-2 by FDA under an Emergency Use Authorization (EUA). This EUA will remain  in effect (meaning this test can be used) for the duration of the COVID-19 declaration under Section 56 4(b)(1) of the Act, 21 U.S.C. section 360bbb-3(b)(1), unless the authorization is terminated or revoked sooner. Performed at [facility], [address], [place], [place] [address]   Surgical PCR screen     Status: None   Collection Time: 07/21/19  6:24 AM   Specimen: Nasal Mucosa; Nasal Swab  Result Value Ref Range Status   MRSA,  PCR NEGATIVE NEGATIVE Final   Staphylococcus aureus NEGATIVE NEGATIVE Final    Comment: (NOTE) The Xpert SA Assay (FDA approved for NASAL specimens in patients 63 years of age and older), is one component of a comprehensive surveillance program. It is not intended to diagnose infection nor to guide or monitor treatment. Performed at [facility], [address] [place], [place] [address]   SARS CORONAVIRUS 2 (TAT 6-24 HRS) Nasopharyngeal Nasopharyngeal Swab     Status: None   Collection Time: 07/23/19  2:14 PM   Specimen: Nasopharyngeal Swab  Result Value Ref Range Status   SARS Coronavirus 2 NEGATIVE NEGATIVE Final    Comment: (NOTE) SARS-CoV-2 target nucleic acids are NOT DETECTED. The SARS-CoV-2 RNA is generally detectable in upper and lower respiratory specimens during the acute phase of infection. Negative results do not preclude SARS-CoV-2 infection, do not rule out co-infections with other pathogens, and should not be used as the sole basis for treatment or other patient management decisions. Negative results must be combined with clinical observations, patient  history, and epidemiological information. The expected result is Negative. Fact Sheet for Patients: HairSlick.no Fact Sheet for Healthcare Providers: quierodirigir.com This test is not yet approved or cleared by the [place] FDA and  has been authorized for detection and/or diagnosis of SARS-CoV-2 by FDA under an Emergency Use Authorization (EUA). This EUA will remain  in effect (meaning this test can be used) for the duration of the COVID-19 declaration under Section 56 4(b)(1) of the Act, 21 U.S.C. section 360bbb-3(b)(1), unless the authorization is terminated or revoked sooner. Performed at [facility], [address] [place], [place] [address]          Radiology Studies: No results found.      Scheduled Meds: .  amLODipine  10 mg Oral Daily  . atorvastatin  20 mg Oral Daily  . Chlorhexidine Gluconate Cloth  6 each Topical Daily  . docusate sodium  100 mg Oral BID  . enoxaparin (LOVENOX) injection  30 mg Subcutaneous Q24H  . feeding supplement (ENSURE ENLIVE)  237 mL Oral TID BM  . gabapentin  100 mg Oral Daily  . insulin aspart  0-9 Units Subcutaneous TID WC  . levothyroxine  25 mcg Oral Q0600  . mirtazapine  15 mg Oral QHS  . senna-docusate  2 tablet Oral BID  . traZODone  50 mg Oral QHS   Continuous Infusions:    LOS: 4 days    Time spent: 30 mins    Charise Killian, MD [facility] Pager [phone]  If 7PM-7AM, please contact night-coverage [email or web address] 07/24/2019, 8:25 AM

## 2019-07-24 NOTE — Progress Notes (Signed)
Initial Nutrition Assessment  RD working remotely.  DOCUMENTATION CODES:   Not applicable  INTERVENTION:  Continue Ensure Enlive po TID, each supplement provides 350 kcal and 20 grams of protein.  Also provide Pro-Stat 30 mL po BID with meals, each supplement provides 100 kcal and 15 grams of protein.  Provide daily MVI.  NUTRITION DIAGNOSIS:   Increased nutrient needs related to post-op healing as evidenced by estimated needs.  GOAL:   Patient will meet greater than or equal to 90% of their needs  MONITOR:   PO intake, Supplement acceptance, Labs, Weight trends, Skin, TF tolerance, I & O's  REASON FOR ASSESSMENT:   Consult Poor PO  ASSESSMENT:   86 year old female with PMHx of Alzheimer's dementia, DM, hypothyroidism, HTN admitted after a fall with left hip fracture s/p left hip hemiarthroplasty on 3/13.   Attempted to call patient over the phone but she was unable to answer. According to chart patient is eating 0-50% of her meals. She was ordered for Ensure Enlive TID on 3/12 and does appear to be drinking about 1-2 bottles per day.  Patient was 56.7 kg in 2019. It appears the two weights entered as 46.7 kg were typos and meant to be copied forward as 56.7 kg. She is now 52.2 kg (115 lbs) if current weight is accurate.  Medications reviewed and include: Colace 100 mg BID, Ensure Enlive TID, gabapentin, Novolog 0-9 units TID, levothyroxine, Remeron 15 mg QHS, senna-docusate 2 tablets BID.  Labs reviewed: CBG 110-205, BUN 43, Creatinine 1.19.  Unable to determine if patient meets criteria for malnutrition at this time.  NUTRITION - FOCUSED PHYSICAL EXAM:  Unable to complete at this time.  Diet Order:   Diet Order            Diet Carb Modified Fluid consistency: Thin; Room service appropriate? Yes  Diet effective now             EDUCATION NEEDS:   No education needs have been identified at this time  Skin:  Skin Assessment: Skin Integrity Issues:(closed  incision left hip)  Last BM:  Unknown  Height:   Ht Readings from Last 1 Encounters:  07/20/19 5' (1.524 m)   Weight:   Wt Readings from Last 1 Encounters:  07/20/19 52.2 kg   BMI:  Body mass index is 22.46 kg/m.  Estimated Nutritional Needs:   Kcal:  1300-5100  Protein:  65-75 grams  Fluid:  1.3-1.5 L/day  Felix Pacini, MS, RD, LDN Pager number available on Amion

## 2019-07-24 NOTE — Progress Notes (Signed)
  Subjective: 3 Days Post-Op Procedure(s) (LRB): ARTHROPLASTY BIPOLAR HIP (HEMIARTHROPLASTY) (Left) Patient is resting comfortably this AM.  Does not want to move for me to view the incision. Plan is to go Skilled nursing facility after hospital stay. Negative for chest pain and shortness of breath Fever: 99.2 this AM. Gastrointestinal:Negative for nausea and vomiting  Objective: Vital signs in last 24 hours: Temp:  [98.4 F (36.9 C)-99.2 F (37.3 C)] 99.2 F (37.3 C) (03/16 0808) Pulse Rate:  [77-85] 77 (03/16 0808) Resp:  [16] 16 (03/15 2358) BP: (145-149)/(56-78) 145/78 (03/16 0808) SpO2:  [96 %-100 %] 100 % (03/16 0808)  Intake/Output from previous day:  Intake/Output Summary (Last 24 hours) at 07/24/2019 1148 Last data filed at 07/24/2019 0513 Gross per 24 hour  Intake 90 ml  Output 800 ml  Net -710 ml    Intake/Output this shift: No intake/output data recorded.  Labs: Recent Labs    07/22/19 0638 07/23/19 0956 07/24/19 0909  HGB 9.2* 9.0* 9.2*   Recent Labs    07/23/19 0956 07/24/19 0909  WBC 9.6 8.5  RBC 3.03* 3.10*  HCT 27.1* 27.9*  PLT 157 174   Recent Labs    07/23/19 0956 07/24/19 0506  NA 140 144  K 3.9 3.9  CL 105 107  CO2 26 30  BUN 39* 43*  CREATININE 1.38* 1.19*  GLUCOSE 252* 225*  CALCIUM 8.1* 8.3*   No results for input(s): LABPT, INR in the last 72 hours.   EXAM General - Patient is Disorganized, Confused and Lacking Extremity - ABD soft Dorsiflexion/Plantar flexion intact Incision: scant drainage Dressing/Incision - blood tinged drainage Motor Function - intact, moving foot and toes well on exam.  Past Medical History:  Diagnosis Date  . Alzheimer's dementia (HCC)   . Cellulitis of left lower extremity   . Diabetes type 2, controlled (HCC)   . Hearing impairment   . Hypertension   . Hypothyroidism   . Unsteady gait   . Vision impairment     Assessment/Plan: 3 Days Post-Op Procedure(s) (LRB): ARTHROPLASTY BIPOLAR  HIP (HEMIARTHROPLASTY) (Left) Active Problems:   Closed left hip fracture (HCC)  Estimated body mass index is 22.46 kg/m as calculated from the following:   Height as of this encounter: 5' (1.524 m).   Weight as of this encounter: 52.2 kg. Advance diet Up with therapy when tolerated.  Labs reviewed this AM. WBC 9.6, Hg 9.0. Continue to work on having a BM. Continue with PT however patient is pretty resistive to PT exercises.  Upon discharge continue Lovenox 40mg  daily for DVT prevention. Staple can be removed by SNF on 08/04/19.  Follow-up with [facility] orthopaedics in 6 weeks for x-rays of the left hip.  DVT Prophylaxis - Lovenox, Foot Pumps and TED hose Weight-Bearing as tolerated to left leg  [facility], PA-C [facility] Orthopaedic Surgery 07/24/2019, 11:48 AM

## 2019-07-24 NOTE — Progress Notes (Signed)
PT Cancellation Note  Patient Details Name: [name] MRN: [id number] DOB: [birth year]   Cancelled Treatment:     PT attempt. Pt has eye closed upon arriving but was awake. Therapist attempted to motivate pt to participate in PT however pt begins crying and yells out" I cant do it right now, I'm tired!" Therapist discussed importance of PT and pt requested therapist return later. Cognition deficits limiting progress. Therapist will return later this date per to progress pt as able.    Rushie Chestnut 07/24/2019, 11:22 AM

## 2019-07-25 DIAGNOSIS — S72002S Fracture of unspecified part of neck of left femur, sequela: Secondary | ICD-10-CM

## 2019-07-25 DIAGNOSIS — E039 Hypothyroidism, unspecified: Secondary | ICD-10-CM

## 2019-07-25 DIAGNOSIS — K5900 Constipation, unspecified: Secondary | ICD-10-CM

## 2019-07-25 LAB — CBC
HCT: 29 % — ABNORMAL LOW (ref 36.0–46.0)
Hemoglobin: 9.4 g/dL — ABNORMAL LOW (ref 12.0–15.0)
MCH: 29.4 pg (ref 26.0–34.0)
MCHC: 32.4 g/dL (ref 30.0–36.0)
MCV: 90.6 fL (ref 80.0–100.0)
Platelets: 214 10*3/uL (ref 150–400)
RBC: 3.2 MIL/uL — ABNORMAL LOW (ref 3.87–5.11)
RDW: 12.7 % (ref 11.5–15.5)
WBC: 8.4 10*3/uL (ref 4.0–10.5)
nRBC: 0 % (ref 0.0–0.2)

## 2019-07-25 LAB — BASIC METABOLIC PANEL
Anion gap: 11 (ref 5–15)
BUN: 52 mg/dL — ABNORMAL HIGH (ref 8–23)
CO2: 29 mmol/L (ref 22–32)
Calcium: 8.5 mg/dL — ABNORMAL LOW (ref 8.9–10.3)
Chloride: 105 mmol/L (ref 98–111)
Creatinine, Ser: 1.26 mg/dL — ABNORMAL HIGH (ref 0.44–1.00)
GFR calc Af Amer: 44 mL/min — ABNORMAL LOW (ref >60)
GFR calc non Af Amer: 38 mL/min — ABNORMAL LOW (ref >60)
Glucose, Bld: 199 mg/dL — ABNORMAL HIGH (ref 70–99)
Potassium: 3.7 mmol/L (ref 3.5–5.1)
Sodium: 145 mmol/L (ref 135–145)

## 2019-07-25 LAB — GLUCOSE, CAPILLARY
Glucose-Capillary: 197 mg/dL — ABNORMAL HIGH (ref 70–99)
Glucose-Capillary: 167 mg/dL — ABNORMAL HIGH (ref 70–99)

## 2019-07-25 MED ORDER — DOCUSATE SODIUM 100 MG PO CAPS: 100.0000 mg | ORAL_CAPSULE | Freq: Two times a day (BID) | ORAL | 0 refills | Status: AC | PRN

## 2019-07-25 MED ORDER — ACETAMINOPHEN 325 MG PO TABS: 650.0000 mg | ORAL_TABLET | Freq: Four times a day (QID) | ORAL | Status: AC | PRN

## 2019-07-25 MED ORDER — ADULT MULTIVITAMIN W/MINERALS CH: 1.0000 | ORAL_TABLET | Freq: Every day | ORAL | Status: AC

## 2019-07-25 MED ORDER — BISACODYL 10 MG RE SUPP: 10.0000 mg | Freq: Every day | RECTAL | 0 refills | Status: AC | PRN

## 2019-07-25 MED ORDER — AMLODIPINE BESYLATE 10 MG PO TABS: 10.0000 mg | ORAL_TABLET | Freq: Every day | ORAL | Status: AC

## 2019-07-25 NOTE — Progress Notes (Signed)
Physical Therapy Treatment Patient Details Name: [name] MRN: [id number] DOB: Sep 07, 1927 Today's Date: 07/25/2019    History of Present Illness Pt is a 86y/o female s/p Left hip unipolar hemiarthroplasty (07/21/19) following fall out of WC at SNF (residence). Patient is a poor historian d/t Alzheimers demntia, with documented PMH of diabetes. Nursing (Tammy) gives PLOF history and reports patient is maxA for all transfers and totalA for all ADLs. Patient is not verbally expressive, altough she does use words when agitated, but nods/shakes head to demonstrate understanding    PT Comments    Pt was supine in bed upon arriving. She continues to be oriented to self only however was cooperative with encouragement. She was able to exit L side of bed with increased time + max assist. Sat EOB x ten minutes throughout session with min assist to maintain sitting balance. She stood 3 x max assist then stood pivot on/off BSC with max assist. Gait belt + max vcs for safety and technique. Overall pt tolerated session well. She was repositioned supine in bed post session with bed alarm set and call bell in reach. PT continues to recommend DC to SNF once medically stable.       Follow Up Recommendations  SNF     Equipment Recommendations  Rolling walker with 5" wheels    Recommendations for Other Services       Precautions / Restrictions Precautions Precautions: Posterior Hip Precaution Booklet Issued: No Restrictions Weight Bearing Restrictions: Yes LLE Weight Bearing: Weight bearing as tolerated    Mobility  Bed Mobility Overal bed mobility: Needs Assistance Bed Mobility: Supine to Sit;Sit to Supine Rolling: Max assist   Supine to sit: Max assist;HOB elevated Sit to supine: Max assist   General bed mobility comments: Pt was able to progress from supine to sit EOB with max assist + increased time. Vcs throughout for technique, sequencing, and safety. cognition deficits slow  progression of all mobility  Transfers Overall transfer level: Needs assistance Equipment used: Rolling walker (2 wheeled) Transfers: Sit to/from [employer] Sit to Stand: Max assist Stand pivot transfers: Max assist;From elevated surface       General transfer comment: Pt was able to stand EOB 3 x prior to stand pivot on/off BSC. max assist + gait belt to stand. Pt slightly impulsive and attempts to immediately sit upon standing. Pt stood with flexed posture and BUE support  Ambulation/Gait             General Gait Details: unsafe to progress   Social research officer, government [employer] (Stroke Patients Only)       Balance                                            Cognition Arousal/Alertness: Awake/alert Behavior During Therapy: WFL for tasks assessed/performed Overall Cognitive Status: History of cognitive impairments - at baseline                                 General Comments: dementia at baseline. Pt requires increased time with all mobility and exercises 2/2 to cognition deficits. She was able to follow simple cues but requires reinforcement and extra time processing. Only oriented to self.  Exercises General Exercises - Lower Extremity Ankle Circles/Pumps: 10 reps;AROM;Both Heel Slides: AAROM;5 reps;Left    General Comments        Pertinent Vitals/Pain Pain Assessment: Faces Faces Pain Scale: Hurts whole lot Pain Location: L hip Pain Descriptors / Indicators: Grimacing Pain Intervention(s): Limited activity within patient's tolerance;Monitored during session;Repositioned    Home Living                      Prior Function            PT Goals (current goals can now be found in the care plan section) Acute Rehab PT Goals Patient Stated Goal: none stated Progress towards PT goals: Progressing toward goals    Frequency    7X/week      PT Plan Current  plan remains appropriate    Co-evaluation              AM-PAC PT "6 Clicks" Mobility   Outcome Measure  Help needed turning from your back to your side while in a flat bed without using bedrails?: A Lot Help needed moving from lying on your back to sitting on the side of a flat bed without using bedrails?: A Lot Help needed moving to and from a bed to a chair (including a wheelchair)?: A Lot Help needed standing up from a chair using your arms (e.g., wheelchair or bedside chair)?: A Lot Help needed to walk in hospital room?: Total Help needed climbing 3-5 steps with a railing? : Total 6 Click Score: 10    End of Session Equipment Utilized During Treatment: Gait belt Activity Tolerance: Patient tolerated treatment well;Patient limited by pain Patient left: in bed;with call bell/phone within reach;with bed alarm set Nurse Communication: Mobility status PT Visit Diagnosis: Other abnormalities of gait and mobility (R26.89);Repeated falls (R29.6);Difficulty in walking, not elsewhere classified (R26.2);Muscle weakness (generalized) (M62.81);Unsteadiness on feet (R26.81);Pain Pain - Right/Left: Left Pain - part of body: Hip     Time: 0941-1000 PT Time Calculation (min) (ACUTE ONLY): 19 min  Charges:  $Therapeutic Activity: 8-22 mins                     Julaine Fusi PTA 07/25/19, 10:15 AM

## 2019-07-25 NOTE — TOC Progression Note (Signed)
Transition of Care [facility]) - Progression Note    Patient Details  Name: [name] MRN: [id number] Date of Birth: Sep 21, 1927  [facility] CM/SW Contact  Amand Lemoine, Lemar Livings, LCSW Phone Number: 07/25/2019, 9:45 AM  Clinical Narrative:   MD reports pt is medically ready for transfer to [facility],. Spoke with [facility] to inform she will go to room 1B. COVID test negative. MD to work on paperwork for transfer today.         Expected Discharge Plan and Services                                                 Social Determinants of Health (SDOH) Interventions    Readmission Risk Interventions No flowsheet data found.

## 2019-07-25 NOTE — Progress Notes (Signed)
Pt d/c to [facility] this afternoon via EMS.  All belongings were taken at time of d/c.  IV removed without issue.  NAD at d/c.

## 2019-07-25 NOTE — Discharge Instructions (Signed)

## 2019-07-25 NOTE — Progress Notes (Signed)
  Subjective: 4 Days Post-Op Procedure(s) (LRB): ARTHROPLASTY BIPOLAR HIP (HEMIARTHROPLASTY) (Left) Patient is resting comfortably this AM.  Does not want to move for me to view the incision. Plan is to go Skilled nursing facility after hospital stay. Negative for chest pain and shortness of breath Fever: No Gastrointestinal:Negative for nausea and vomiting  Objective: Vital signs in last 24 hours: Temp:  [98.2 F (36.8 C)-98.9 F (37.2 C)] 98.9 F (37.2 C) (03/17 0810) Pulse Rate:  [76-87] 76 (03/17 0810) Resp:  [18] 18 (03/17 0003) BP: (149-156)/(63-66) 149/63 (03/17 0810) SpO2:  [98 %-100 %] 98 % (03/17 0810)  Intake/Output from previous day:  Intake/Output Summary (Last 24 hours) at 07/25/2019 1233 Last data filed at 07/25/2019 1033 Gross per 24 hour  Intake 120 ml  Output --  Net 120 ml    Intake/Output this shift: Total I/O In: 120 [P.O.:120] Out: -   Labs: Recent Labs    07/23/19 0956 07/24/19 0909 07/25/19 0553  HGB 9.0* 9.2* 9.4*   Recent Labs    07/24/19 0909 07/25/19 0553  WBC 8.5 8.4  RBC 3.10* 3.20*  HCT 27.9* 29.0*  PLT 174 214   Recent Labs    07/24/19 0506 07/25/19 0553  NA 144 145  K 3.9 3.7  CL 107 105  CO2 30 29  BUN 43* 52*  CREATININE 1.19* 1.26*  GLUCOSE 225* 199*  CALCIUM 8.3* 8.5*   No results for input(s): LABPT, INR in the last 72 hours.   EXAM General - Patient is Disorganized, Confused and Lacking Extremity - ABD soft Dorsiflexion/Plantar flexion intact Incision: scant drainage Dressing/Incision - blood tinged drainage Motor Function - intact, moving foot and toes well on exam.  Past Medical History:  Diagnosis Date  . Alzheimer's dementia (HCC)   . Cellulitis of left lower extremity   . Diabetes type 2, controlled (HCC)   . Hearing impairment   . Hypertension   . Hypothyroidism   . Unsteady gait   . Vision impairment     Assessment/Plan: 4 Days Post-Op Procedure(s) (LRB): ARTHROPLASTY BIPOLAR HIP  (HEMIARTHROPLASTY) (Left) Active Problems:   Closed left hip fracture (HCC)  Estimated body mass index is 22.46 kg/m as calculated from the following:   Height as of this encounter: 5' (1.524 m).   Weight as of this encounter: 52.2 kg. Advance diet Up with therapy when tolerated.  Labs reviewed this AM. Patient has had a BM.  Upon discharge continue Lovenox 40mg  daily for DVT prevention. Staple can be removed by SNF on 08/04/19.  Follow-up with [facility] orthopaedics in 6 weeks for x-rays of the left hip.  DVT Prophylaxis - Lovenox, Foot Pumps and TED hose Weight-Bearing as tolerated to left leg  [facility], PA-C [facility] Orthopaedic Surgery 07/25/2019, 12:33 PM

## 2019-07-25 NOTE — Progress Notes (Signed)
Called report to Telia at [facility]. EMS to transport approx 1:30pm

## 2019-07-25 NOTE — Discharge Summary (Signed)
[name] WUJ:[id number] DOB: Nov 16, 1927 DOA: 07/20/2019  PCP: Clemetine Markerampbell, Margaret P, FNP  Admit date: 07/20/2019 Discharge date: 07/25/2019  Admitted From:SNF Disposition:  [facility]  Recommendations for Outpatient Follow-up:  1. Follow up with PCP in 1 week 2. Please obtain BMP/CBC in one week 3. Staple can be removed by SNF on 08/04/19.   4.[facility] in 6 weeks for x-rays of the left hip.     Discharge Condition:Stable CODE STATUS:full Diet recommendation:  Carb Modified Brief/Interim Summary: Gloria FateBlossom Riker  is a 86 y.o. African-American female with a known history of Alzheimer's dementia, type 2 diabetes mellitus, hypertension and hypothyroidism, who presented to the emergency room with acute onset of left hip pain with inability to bear weight on today after having an unwitnessed fall . Left hip x-ray revealed left femoral neck fracture.  Orthopedic surgery was consulted.  She is status post left hemiarthroplasty.  She received physical therapy which recommended SNF.  Per Ortho patient is to be on Lovenox 40 mg daily for DVT prevention until sees orthopedics.  Patient has no complaints.  Her SARS Covid was negative on 07/23/2019.  She has been afebrile for over 24 hours.  She did have hypertensive urgency which resolved.  She was started on amlodipine.  Medications may need to be adjusted at the rehab.  She is on a carb controlled diet.  Low cytopenia on presentation which resolved.  Etiology was unclear.  She will be [facility] to [facility] today.   Discharge Diagnoses:  Active Problems:   Closed left hip fracture [facility](HCC)    Discharge Instructions  Discharge Instructions    Call MD for:  temperature >100.4   Complete by: As directed    Diet - low sodium heart healthy   Complete by: As directed    Discharge instructions   Complete by: As directed    Staple can be removed by SNF on 08/04/19.  [facility] in 6 weeks for  x-rays of the left hip   Increase activity slowly   Complete by: As directed      Allergies as of 07/25/2019   No Known Allergies     Medication List    TAKE these medications   acetaminophen 325 MG tablet Commonly known as: TYLENOL Take 2 tablets (650 mg total) by mouth every 6 (six) hours as needed for mild pain (or Fever >/= 101). What changed:   medication strength  how much to take  when to take this  reasons to take this   amLODipine 10 MG tablet Commonly known as: NORVASC Take 1 tablet (10 mg total) by mouth daily. Start taking on: July 26, 2019   atorvastatin 20 MG tablet Commonly known as: LIPITOR Take 20 mg by mouth daily.   bisacodyl 10 MG suppository Commonly known as: DULCOLAX Place 1 suppository (10 mg total) rectally daily as needed for moderate constipation.   docusate sodium 100 MG capsule Commonly known as: COLACE Take 1 capsule (100 mg total) by mouth 2 (two) times daily as needed for mild constipation.   enoxaparin 30 MG/0.3ML injection Commonly known as: LOVENOX Inject 0.3 mLs (30 mg total) into the skin daily.   feeding supplement (ENSURE ENLIVE) Liqd Take 237 mLs by mouth 3 (three) times daily between meals.   gabapentin 100 MG capsule Commonly known as: NEURONTIN Take 100 mg by mouth daily.   levothyroxine 25 MCG tablet Commonly known as: SYNTHROID Take 25 mcg by mouth daily before breakfast.  mirtazapine 15 MG tablet Commonly known as: REMERON Take 15 mg by mouth at bedtime.   multivitamin with minerals Tabs tablet Take 1 tablet by mouth daily. Start taking on: July 26, 2019   sennosides-docusate sodium 8.6-50 MG tablet Commonly known as: SENOKOT-S Take 2 tablets by mouth 2 (two) times daily.   traMADol 50 MG tablet Commonly known as: ULTRAM Take 1-2 tablets (50-100 mg total) by mouth every 6 (six) hours as needed for moderate pain.   traZODone 50 MG tablet Commonly known as: DESYREL Take 50 mg by mouth at  bedtime.       Contact information for follow-up providers    Poggi, Excell Seltzer, MD Follow up in 6 week(s).   Specialty: Orthopedic Surgery Contact information: [address] [facility] [place] [address] [phone]            Contact information for after-discharge care    Destination    [facility] Preferred SNF .   Service: Skilled Nursing Contact information: [address] [place] [address] [phone]                 No Known Allergies  Consultations:   Orthopedics  Procedures/Studies: DG Chest 1 View  Result Date: 07/20/2019 CLINICAL DATA:  Recent fall with known left hip fracture EXAM: CHEST  1 VIEW COMPARISON:  03/25/18 FINDINGS: Cardiac shadow is mildly enlarged. Lungs are well aerated bilaterally. No focal infiltrate or sizable effusion is seen. Lungs are clear bilaterally. Nipple shadows are noted bilaterally. No acute bony abnormality is seen. IMPRESSION: No acute abnormality noted. Electronically Signed   By: Alcide Clever M.D.   On: 07/20/2019 18:11   DG HIP UNILAT W OR W/O PELVIS 2-3 VIEWS LEFT  Result Date: 07/21/2019 CLINICAL DATA:  Status post left hemiarthroplasty EXAM: DG HIP (WITH OR WITHOUT PELVIS) 2-3V LEFT COMPARISON:  07/20/2019 FINDINGS: New left hemiarthroplasty appears well-seated and well aligned. No acute fracture or evidence of an operative complication. IMPRESSION: Well-positioned left hip hemiarthroplasty. Electronically Signed   By: Amie [place] M.D.   On: 07/21/2019 12:36   DG Hip Unilat W or Wo Pelvis 2-3 Views Left  Result Date: 07/20/2019 CLINICAL DATA:  Left hip pain following fall, initial encounter EXAM: DG HIP (WITH OR WITHOUT PELVIS) 3V LEFT COMPARISON:  None. FINDINGS: Subcapital left femoral neck fracture is noted with impaction and angulation at the fracture site. Pelvic ring is otherwise intact. Right hip replacement is seen. Calcified uterine fibroids are noted.  IMPRESSION: Left femoral neck fracture with impaction and angulation at the fracture site. Electronically Signed   By: Alcide Clever M.D.   On: 07/20/2019 18:09       Subjective: Patient without any complaints  Discharge Exam: Vitals:   07/25/19 0003 07/25/19 0810  BP: (!) 151/66 (!) 149/63  Pulse: 87 76  Resp: 18   Temp: 98.2 F (36.8 C) 98.9 F (37.2 C)  SpO2: 99% 98%   Vitals:   07/24/19 0808 07/24/19 1606 07/25/19 0003 07/25/19 0810  BP: (!) 145/78 (!) 156/64 (!) 151/66 (!) 149/63  Pulse: 77 78 87 76  Resp:   18   Temp: 99.2 F (37.3 C) 98.2 F (36.8 C) 98.2 F (36.8 C) 98.9 F (37.2 C)  TempSrc: Oral Oral Oral Oral  SpO2: 100% 100% 99% 98%  Weight:      Height:        General: Pt is alert, awake, not in acute distress Cardiovascular: RRR, S1/S2 +,  no rubs, no gallops Respiratory: CTA bilaterally, no wheezing, no rhonchi Abdominal: Soft, NT, ND, bowel sounds + Extremities: no edema, no cyanosis    The results of significant diagnostics from this hospitalization (including imaging, microbiology, ancillary and laboratory) are listed below for reference.     Microbiology: Recent Results (from the past 240 hour(s))  SARS CORONAVIRUS 2 (TAT 6-24 HRS) Nasopharyngeal Nasopharyngeal Swab     Status: None   Collection Time: 07/20/19  6:52 PM   Specimen: Nasopharyngeal Swab  Result Value Ref Range Status   SARS Coronavirus 2 NEGATIVE NEGATIVE Final    Comment: (NOTE) SARS-CoV-2 target nucleic acids are NOT DETECTED. The SARS-CoV-2 RNA is generally detectable in upper and lower respiratory specimens during the acute phase of infection. Negative results do not preclude SARS-CoV-2 infection, do not rule out co-infections with other pathogens, and should not be used as the sole basis for treatment or other patient management decisions. Negative results must be combined with clinical observations, patient history, and epidemiological information. The expected result  is Negative. Fact Sheet for Patients: SugarRoll.be Fact Sheet for Healthcare Providers: https://www.woods-mathews.com/ This test is not yet approved or cleared by the [place] FDA and  has been authorized for detection and/or diagnosis of SARS-CoV-2 by FDA under an Emergency Use Authorization (EUA). This EUA will remain  in effect (meaning this test can be used) for the duration of the COVID-19 declaration under Section 56 4(b)(1) of the Act, 21 U.S.C. section 360bbb-3(b)(1), unless the authorization is terminated or revoked sooner. Performed at [facility], [place] [address], [place], [place] [address]   Surgical PCR screen     Status: None   Collection Time: 07/21/19  6:24 AM   Specimen: Nasal Mucosa; Nasal Swab  Result Value Ref Range Status   MRSA, PCR NEGATIVE NEGATIVE Final   Staphylococcus aureus NEGATIVE NEGATIVE Final    Comment: (NOTE) The Xpert SA Assay (FDA approved for NASAL specimens in patients 67 years of age and older), is one component of a comprehensive surveillance program. It is not intended to diagnose infection nor to guide or monitor treatment. Performed at [facility], [place], [place] [address]   SARS CORONAVIRUS 2 (TAT 6-24 HRS) Nasopharyngeal Nasopharyngeal Swab     Status: None   Collection Time: 07/23/19  2:14 PM   Specimen: Nasopharyngeal Swab  Result Value Ref Range Status   SARS Coronavirus 2 NEGATIVE NEGATIVE Final    Comment: (NOTE) SARS-CoV-2 target nucleic acids are NOT DETECTED. The SARS-CoV-2 RNA is generally detectable in upper and lower respiratory specimens during the acute phase of infection. Negative results do not preclude SARS-CoV-2 infection, do not rule out co-infections with other pathogens, and should not be used as the sole basis for treatment or other patient management decisions. Negative results must be combined with clinical observations, patient  history, and epidemiological information. The expected result is Negative. Fact Sheet for Patients: SugarRoll.be Fact Sheet for Healthcare Providers: https://www.woods-mathews.com/ This test is not yet approved or cleared by the [place] FDA and  has been authorized for detection and/or diagnosis of SARS-CoV-2 by FDA under an Emergency Use Authorization (EUA). This EUA will remain  in effect (meaning this test can be used) for the duration of the COVID-19 declaration under Section 56 4(b)(1) of the Act, 21 U.S.C. section 360bbb-3(b)(1), unless the authorization is terminated or revoked sooner. Performed at [facility], [place] [address] [place], [place] [address]  Labs: BNP (last 3 results) No results for input(s): BNP in the last 8760 hours. Basic Metabolic Panel: Recent Labs  Lab 07/21/19 0453 07/22/19 0638 07/23/19 0956 07/24/19 0506 07/25/19 0553  NA 138 140 140 144 145  K 4.3 4.0 3.9 3.9 3.7  CL 103 106 105 107 105  CO2 30 25 26 30 29   GLUCOSE 197* 233* 252* 225* 199*  BUN 25* 35* 39* 43* 52*  CREATININE 1.00 1.24* 1.38* 1.19* 1.26*  CALCIUM 8.8* 8.2* 8.1* 8.3* 8.5*   Liver Function Tests: No results for input(s): AST, ALT, ALKPHOS, BILITOT, PROT, ALBUMIN in the last 168 hours. No results for input(s): LIPASE, AMYLASE in the last 168 hours. No results for input(s): AMMONIA in the last 168 hours. CBC: Recent Labs  Lab 07/21/19 0453 07/22/19 0638 07/23/19 0956 07/24/19 0909 07/25/19 0553  WBC 10.2 10.0 9.6 8.5 8.4  HGB 10.8* 9.2* 9.0* 9.2* 9.4*  HCT 32.2* 27.3* 27.1* 27.9* 29.0*  MCV 87.3 88.6 89.4 90.0 90.6  PLT 187 142* 157 174 214   Cardiac Enzymes: No results for input(s): CKTOTAL, CKMB, CKMBINDEX, TROPONINI in the last 168 hours. BNP: Invalid input(s): POCBNP CBG: Recent Labs  Lab 07/24/19 0805 07/24/19 1151 07/24/19 1633 07/24/19 2122 07/25/19 0809  GLUCAP 205* 216* 181* 165* 197*    D-Dimer No results for input(s): DDIMER in the last 72 hours. Hgb A1c No results for input(s): HGBA1C in the last 72 hours. Lipid Profile No results for input(s): CHOL, HDL, LDLCALC, TRIG, CHOLHDL, LDLDIRECT in the last 72 hours. Thyroid function studies No results for input(s): TSH, T4TOTAL, T3FREE, THYROIDAB in the last 72 hours.  Invalid input(s): FREET3 Anemia work up No results for input(s): VITAMINB12, FOLATE, FERRITIN, TIBC, IRON, RETICCTPCT in the last 72 hours. Urinalysis    Component Value Date/Time   COLORURINE YELLOW (A) 07/22/2019 1730   APPEARANCEUR HAZY (A) 07/22/2019 1730   APPEARANCEUR Clear 10/01/2011 1045   LABSPEC 1.015 07/22/2019 1730   LABSPEC 1.008 10/01/2011 1045   PHURINE 5.0 07/22/2019 1730   GLUCOSEU 150 (A) 07/22/2019 1730   GLUCOSEU 50 mg/dL 07/24/2019 57/84/6962   HGBUR SMALL (A) 07/22/2019 1730   BILIRUBINUR NEGATIVE 07/22/2019 1730   BILIRUBINUR Negative 10/01/2011 1045   KETONESUR NEGATIVE 07/22/2019 1730   PROTEINUR 100 (A) 07/22/2019 1730   NITRITE NEGATIVE 07/22/2019 1730   LEUKOCYTESUR SMALL (A) 07/22/2019 1730   LEUKOCYTESUR Negative 10/01/2011 1045   Sepsis Labs Invalid input(s): PROCALCITONIN,  WBC,  LACTICIDVEN Microbiology Recent Results (from the past 240 hour(s))  SARS CORONAVIRUS 2 (TAT 6-24 HRS) Nasopharyngeal Nasopharyngeal Swab     Status: None   Collection Time: 07/20/19  6:52 PM   Specimen: Nasopharyngeal Swab  Result Value Ref Range Status   SARS Coronavirus 2 NEGATIVE NEGATIVE Final    Comment: (NOTE) SARS-CoV-2 target nucleic acids are NOT DETECTED. The SARS-CoV-2 RNA is generally detectable in upper and lower respiratory specimens during the acute phase of infection. Negative results do not preclude SARS-CoV-2 infection, do not rule out co-infections with other pathogens, and should not be used as the sole basis for treatment or other patient management decisions. Negative results must be combined with clinical  observations, patient history, and epidemiological information. The expected result is Negative. Fact Sheet for Patients: 09/19/19 Fact Sheet for Healthcare Providers: HairSlick.no This test is not yet approved or cleared by the [place] FDA and  has been authorized for detection and/or diagnosis of SARS-CoV-2 by FDA under an Emergency Use Authorization (EUA). This EUA  will remain  in effect (meaning this test can be used) for the duration of the COVID-19 declaration under Section 56 4(b)(1) of the Act, 21 U.S.C. section 360bbb-3(b)(1), unless the authorization is terminated or revoked sooner. Performed at [facility], [address] [place], [place] [address]   Surgical PCR screen     Status: None   Collection Time: 07/21/19  6:24 AM   Specimen: Nasal Mucosa; Nasal Swab  Result Value Ref Range Status   MRSA, PCR NEGATIVE NEGATIVE Final   Staphylococcus aureus NEGATIVE NEGATIVE Final    Comment: (NOTE) The Xpert SA Assay (FDA approved for NASAL specimens in patients 30 years of age and older), is one component of a comprehensive surveillance program. It is not intended to diagnose infection nor to guide or monitor treatment. Performed at [facility], [address] [place], [place] [address]   SARS CORONAVIRUS 2 (TAT 6-24 HRS) Nasopharyngeal Nasopharyngeal Swab     Status: None   Collection Time: 07/23/19  2:14 PM   Specimen: Nasopharyngeal Swab  Result Value Ref Range Status   SARS Coronavirus 2 NEGATIVE NEGATIVE Final    Comment: (NOTE) SARS-CoV-2 target nucleic acids are NOT DETECTED. The SARS-CoV-2 RNA is generally detectable in upper and lower respiratory specimens during the acute phase of infection. Negative results do not preclude SARS-CoV-2 infection, do not rule out co-infections with other pathogens, and should not be used as the sole basis for treatment or other patient  management decisions. Negative results must be combined with clinical observations, patient history, and epidemiological information. The expected result is Negative. Fact Sheet for Patients: HairSlick.no Fact Sheet for Healthcare Providers: quierodirigir.com This test is not yet approved or cleared by the [place] FDA and  has been authorized for detection and/or diagnosis of SARS-CoV-2 by FDA under an Emergency Use Authorization (EUA). This EUA will remain  in effect (meaning this test can be used) for the duration of the COVID-19 declaration under Section 56 4(b)(1) of the Act, 21 U.S.C. section 360bbb-3(b)(1), unless the authorization is terminated or revoked sooner. Performed at [facility], [address] [place], [place] [address]      Time coordinating discharge: Over 30 minutes  SIGNED:   Lynn Ito, MD  [facility] 07/25/2019, 11:35 AM Pager   If 7PM-7AM, please contact night-coverage [email or web address] Password [email or web address]

## 2019-07-25 NOTE — TOC Transition Note (Signed)
Transition of Care [facility]) - CM/SW Discharge Note   Patient Details  Name: Mignon Bechler MRN: [id number] Date of Birth: 12-Apr-1928  Transition of [facility]) CM/SW Contact:  Barrie Dunker, RN Phone Number: 07/25/2019, 12:06 PM   Clinical Narrative:    The patient will be discharged today to [facility] room 1 B with EMS to transport, The bedside nurse will call report and Victorino Dike requested that EMS come at 130 RNCM called EMS and requested transport at 130, The patient's daughter [name] called and notified  Final next level of care: Skilled Nursing Facility Barriers to Discharge: Barriers Resolved   Patient Goals and CMS Choice        Discharge Placement              Patient chooses bed at: [facility] Patient to be transferred to facility by: EMS Name of family member notified: [name] daughter Patient and family notified of of transfer: 07/25/19  Discharge Plan and Services                                     Social Determinants of Health (SDOH) Interventions     Readmission Risk Interventions No flowsheet data found.

## 2020-10-08 DEATH — deceased
